# Patient Record
Sex: Female | Born: 1970
Health system: Southern US, Community
[De-identification: ages and names within clinical notes are randomized; demographics above are authoritative.]

## PROBLEM LIST (undated history)

## (undated) DIAGNOSIS — Z603 Acculturation difficulty: Secondary | ICD-10-CM

## (undated) DIAGNOSIS — E785 Hyperlipidemia, unspecified: Secondary | ICD-10-CM

## (undated) DIAGNOSIS — K219 Gastro-esophageal reflux disease without esophagitis: Secondary | ICD-10-CM

## (undated) DIAGNOSIS — E039 Hypothyroidism, unspecified: Secondary | ICD-10-CM

## (undated) DIAGNOSIS — Z789 Other specified health status: Secondary | ICD-10-CM

## (undated) DIAGNOSIS — K760 Fatty (change of) liver, not elsewhere classified: Secondary | ICD-10-CM

## (undated) DIAGNOSIS — M48061 Spinal stenosis, lumbar region without neurogenic claudication: Secondary | ICD-10-CM

## (undated) DIAGNOSIS — Z758 Other problems related to medical facilities and other health care: Secondary | ICD-10-CM

## (undated) DIAGNOSIS — I10 Essential (primary) hypertension: Secondary | ICD-10-CM

## (undated) DIAGNOSIS — M1990 Unspecified osteoarthritis, unspecified site: Secondary | ICD-10-CM

## (undated) DIAGNOSIS — N028 Recurrent and persistent hematuria with other morphologic changes: Secondary | ICD-10-CM

## (undated) DIAGNOSIS — E669 Obesity, unspecified: Secondary | ICD-10-CM

## (undated) DIAGNOSIS — F419 Anxiety disorder, unspecified: Secondary | ICD-10-CM

## (undated) DIAGNOSIS — K759 Inflammatory liver disease, unspecified: Secondary | ICD-10-CM

## (undated) DIAGNOSIS — R011 Cardiac murmur, unspecified: Secondary | ICD-10-CM

## (undated) DIAGNOSIS — E119 Type 2 diabetes mellitus without complications: Secondary | ICD-10-CM

## (undated) DIAGNOSIS — R809 Proteinuria, unspecified: Secondary | ICD-10-CM

## (undated) HISTORY — DX: Hypothyroidism, unspecified: E03.9

## (undated) HISTORY — DX: Fatty (change of) liver, not elsewhere classified: K76.0

## (undated) HISTORY — DX: Unspecified osteoarthritis, unspecified site: M19.90

## (undated) HISTORY — DX: Gastro-esophageal reflux disease without esophagitis: K21.9

## (undated) HISTORY — DX: Acculturation difficulty: Z60.3

## (undated) HISTORY — DX: Other specified health status: Z78.9

## (undated) HISTORY — DX: Obesity, unspecified: E66.9

## (undated) HISTORY — DX: Proteinuria, unspecified: R80.9

## (undated) HISTORY — DX: Other problems related to medical facilities and other health care: Z75.8

## (undated) HISTORY — DX: Recurrent and persistent hematuria with other morphologic changes: N02.8

## (undated) HISTORY — DX: Hyperlipidemia, unspecified: E78.5

## (undated) HISTORY — DX: Spinal stenosis, lumbar region without neurogenic claudication: M48.061

---

## 2004-09-27 ENCOUNTER — Ambulatory Visit: Payer: Self-pay | Admitting: Internal Medicine

## 2004-09-30 ENCOUNTER — Ambulatory Visit: Payer: Self-pay | Admitting: Internal Medicine

## 2007-04-20 ENCOUNTER — Ambulatory Visit (HOSPITAL_COMMUNITY): Admission: RE | Admit: 2007-04-20 | Discharge: 2007-04-20 | Payer: Self-pay | Admitting: Obstetrics

## 2007-06-20 ENCOUNTER — Inpatient Hospital Stay (HOSPITAL_COMMUNITY): Admission: AD | Admit: 2007-06-20 | Discharge: 2007-06-26 | Payer: Self-pay | Admitting: Obstetrics & Gynecology

## 2008-01-20 ENCOUNTER — Emergency Department (HOSPITAL_COMMUNITY): Admission: EM | Admit: 2008-01-20 | Discharge: 2008-01-20 | Payer: Self-pay | Admitting: Emergency Medicine

## 2010-12-16 ENCOUNTER — Emergency Department (HOSPITAL_COMMUNITY)
Admission: EM | Admit: 2010-12-16 | Discharge: 2010-12-17 | Disposition: A | Discharge status: Home / Self Care | Payer: BC Managed Care – PPO | Attending: Emergency Medicine | Admitting: Emergency Medicine

## 2010-12-16 DIAGNOSIS — I10 Essential (primary) hypertension: Secondary | ICD-10-CM | POA: Insufficient documentation

## 2010-12-16 DIAGNOSIS — R319 Hematuria, unspecified: Secondary | ICD-10-CM | POA: Insufficient documentation

## 2010-12-16 DIAGNOSIS — G8929 Other chronic pain: Secondary | ICD-10-CM | POA: Insufficient documentation

## 2010-12-16 DIAGNOSIS — M545 Low back pain, unspecified: Secondary | ICD-10-CM | POA: Insufficient documentation

## 2010-12-16 DIAGNOSIS — R1110 Vomiting, unspecified: Secondary | ICD-10-CM | POA: Insufficient documentation

## 2010-12-16 DIAGNOSIS — R51 Headache: Secondary | ICD-10-CM | POA: Insufficient documentation

## 2010-12-16 LAB — URINALYSIS, ROUTINE W REFLEX MICROSCOPIC
Nitrite: NEGATIVE
Protein, ur: >300 mg/dL — AB
Urobilinogen, UA: 1 mg/dL (ref 0.0–1.0)
pH: 6.5 (ref 5.0–8.0)

## 2010-12-16 LAB — POCT I-STAT, CHEM 8
Chloride: 103 mEq/L (ref 96–112)
Glucose, Bld: 96 mg/dL (ref 70–99)
Hemoglobin: 15 g/dL (ref 12.0–15.0)
TCO2: 24 mmol/L (ref 0–100)

## 2010-12-16 LAB — URINE MICROSCOPIC-ADD ON

## 2010-12-17 LAB — URINE CULTURE
Colony Count: 65000
Culture  Setup Time: 201205150037

## 2010-12-17 NOTE — H&P (Signed)
Linda Black, Linda Black NO.:  [id number]   MEDICAL RECORD NO.:  [id number]          PATIENT TYPE:  INP   LOCATION:  9196                          FACILITY:  [facility]   PHYSICIAN:  Agnes Lawrence, M.D.DATE OF BIRTH:  01-15-1971   DATE OF ADMISSION:  06/20/2007  DATE OF DISCHARGE:                              HISTORY & PHYSICAL   CHIEF COMPLAINT:  The the patient is a 40 year old para 105, with an  estimated date of confinement of July 15, 2008, with an intrauterine  pregnancy at 36+ weeks, who presents complaining of decreased fetal  movement and was found to have elevated blood pressures.   HISTORY OF PRESENT ILLNESS:  Please see the above.  The patient denies  any history of pregnancy-induced hypertension or chronic hypertension.  She denies any neurological complaints.  An initial blood pressure at 14  weeks was in the 130s/80s.  At 32-1/2 weeks, there was one blood  pressure reading of 140/80.   ALLERGIES:  No known drug allergies.   MEDICATIONS:  See the reconciliation form.   CURRENT RISK FACTORS:  Advanced maternal age.   PAST OBSTETRICAL HISTORY:  In 1991, she was delivered of a live born  female, 6 pounds, vaginal delivery.  In 1993, she was delivered of a  live born female, 6 pounds, vaginal delivery.  In 1996, she was  delivered of a live born female, 7 pounds, vaginal delivery.  In 2005, she  was delivered of a live born female, 7 pounds, vaginal delivery.   PRENATAL SCREENING:  Chlamydia probe negative.  Urine culture and  sensitivity insignificant growth.  GC probe negative.  1-hour GCT 116.  Hepatitis B surface antigen negative.  Hematocrit 36.1, hemoglobin 12.4.  HIV nonreactive.  Platelets 302,000.  Blood type is A-positive, antibody  screen negative.  RPR nonreactive.  Rubella immune.  Sickle cell  negative.  An ultrasound performed on August 16 at 27 weeks, 4 days,  redated the patient.  There was no previa.  Normal amniotic fluid.   PAST  GYN HISTORY:  Noncontributory.   PAST MEDICAL HISTORY:  No significant history of medical diseases.   PAST SURGICAL HISTORY:  She denies.   SOCIAL HISTORY:  Married, living with her spouse.  Does not give any  significant history of alcohol usage.  Has no significant smoking  history.  Denies illicit drug use.   FAMILY HISTORY:  Hypertension.   PHYSICAL EXAM:  VITAL SIGNS:  Blood pressures 150s/90s to 170s/100s, and  afebrile.  Fetal heart tracing reassuring.  ABDOMEN:  Gravid.   ASSESSMENTS:  Intrauterine pregnancy at  36+ weeks.  Rule out pregnancy-  induced hypertension.  Fetal heart tracing consistent with fetal well-  being.   PLAN:  Admission.  We will check a PIH panel, serial nonstress tests,  complete OB ultrasound for growth, and we will also collect a 24-hour  urine for protein and creatinine.  Possibly administer antihypertensive  medications.      Agnes Lawrence, M.D.  Electronically Signed     LAJ/MEDQ  D:  06/20/2007  T:  06/21/2007  Job:  [id number]

## 2010-12-20 NOTE — Discharge Summary (Signed)
NAMEEDITH, Linda Black                       ACCOUNT NO.:  [id number]   MEDICAL RECORD NO.:  [id number]          PATIENT TYPE:  INP   LOCATION:  9132                          FACILITY:  [facility]   PHYSICIAN:  Agnes Lawrence, M.D.DATE OF BIRTH:  [birth year]   DATE OF ADMISSION:  06/20/2007  DATE OF DISCHARGE:  06/26/2007                               DISCHARGE SUMMARY   CHIEF COMPLAINT:  The patient is a 40 year old para 4 with an estimated  date of confinement of December 12 with an intrauterine pregnancy at 36+  weeks with an complaining of decreased fetal movement and was found to  have elevated blood pressures.  Please see the dictated history and  physical for further details.   HOSPITAL COURSE:  The patient was admitted.  Her blood pressures were  130s-140s/80s.  On ultrasound, an amniotic fluid index was 2.6.  A 24-  hour urine was obtained and the total protein was 833 mg in 24 hours.  Addendum to the ultrasound reported the estimated fetal weight  percentile was the 27 percentile.  At this point, she was felt to have  preeclampsia and oligohydramnios.  Maternal fetal medicine was  consulted.  The recommendation was delivery.  Unfortunately, there were  no NICU beds available at which point expected management was  recommended until at which point there was an available bed for the  neonate.  Her labor was induced on November 19 and the membranes were  artificially ruptured.  She progressed in labor and was delivered of a  live born female 2330 grams with Apgars 9 at 1 and 5 minutes,  respectively.  She had a second-degree perineal laceration.  The  estimated blood loss was unremarkable.  She had been placed on magnesium  sulfate during labor and this was continued for 24 hours postpartum.  Her blood pressures remained stable.  After the magnesium had been  discontinued, her blood pressures remained stable and she was discharged  to home on postpartum day #2.   DISCHARGE DIAGNOSIS:   Preeclampsia at 36+ weeks.   PROCEDURE:  Induction of labor and vaginal delivery.   CONDITION:  Stable.   DISCHARGE INSTRUCTIONS:  1. Diet:  Regular.  2. Activities:  Pelvic rest, progressive activity.   MEDICATIONS:  Percocet.   DISPOSITION:  The patient was to follow up in 2 days for blood pressure  check.      Agnes Lawrence, M.D.  Electronically Signed     LAJ/MEDQ  D:  07/22/2007  T:  07/23/2007  Job:  [id number]

## 2011-05-01 LAB — POCT I-STAT, CHEM 8
Creatinine, Ser: 0.8
HCT: 41
Hemoglobin: 13.9
Sodium: 140
TCO2: 23

## 2011-05-01 LAB — URINE MICROSCOPIC-ADD ON

## 2011-05-01 LAB — URINALYSIS, ROUTINE W REFLEX MICROSCOPIC
Bilirubin Urine: NEGATIVE
Glucose, UA: NEGATIVE
Nitrite: NEGATIVE
Urobilinogen, UA: 0.2

## 2011-05-01 LAB — POCT PREGNANCY, URINE
Operator id: 146091
Preg Test, Ur: NEGATIVE

## 2011-05-13 LAB — DIFFERENTIAL
Basophils Absolute: 0
Basophils Relative: 0
Lymphocytes Relative: 8 — ABNORMAL LOW
Lymphs Abs: 0.9
Neutrophils Relative %: 87 — ABNORMAL HIGH

## 2011-05-13 LAB — URINALYSIS, ROUTINE W REFLEX MICROSCOPIC
Ketones, ur: NEGATIVE
Leukocytes, UA: NEGATIVE
Protein, ur: 30 — AB

## 2011-05-13 LAB — CBC
HCT: 33.9 — ABNORMAL LOW
HCT: 34.6 — ABNORMAL LOW
HCT: 36.5
Hemoglobin: 12.5
MCHC: 34.2
MCHC: 35.2
MCV: 86.2
MCV: 86.7
Platelets: 294
Platelets: 303
Platelets: 306
RBC: 4.02
RBC: 4.21
RDW: 12.4
RDW: 12.7
RDW: 12.9
WBC: 11.3 — ABNORMAL HIGH
WBC: 11.4 — ABNORMAL HIGH

## 2011-05-13 LAB — URINE MICROSCOPIC-ADD ON

## 2011-05-13 LAB — COMPREHENSIVE METABOLIC PANEL
ALT: 13
Albumin: 2.4 — ABNORMAL LOW
Alkaline Phosphatase: 104
GFR calc Af Amer: >60
Sodium: 136

## 2011-05-13 LAB — PROTEIN, URINE, 24 HOUR
Collection Interval-UPROT: 24
Protein, 24H Urine: 833 — ABNORMAL HIGH
Protein, Urine: 34

## 2011-05-13 LAB — CREATININE CLEARANCE, URINE, 24 HOUR
Collection Interval-CRCL: 24
Urine Total Volume-CRCL: 2450

## 2011-05-13 LAB — SAMPLE TO BLOOD BANK

## 2011-05-13 LAB — URIC ACID: Uric Acid, Serum: 5.3

## 2012-08-07 ENCOUNTER — Emergency Department (HOSPITAL_COMMUNITY): Payer: BC Managed Care – PPO

## 2012-08-07 ENCOUNTER — Encounter (HOSPITAL_COMMUNITY): Payer: Self-pay | Admitting: Emergency Medicine

## 2012-08-07 ENCOUNTER — Emergency Department (HOSPITAL_COMMUNITY)
Admission: EM | Admit: 2012-08-07 | Discharge: 2012-08-07 | Disposition: A | Discharge status: Home / Self Care | Payer: BC Managed Care – PPO | Attending: Emergency Medicine | Admitting: Emergency Medicine

## 2012-08-07 DIAGNOSIS — M7730 Calcaneal spur, unspecified foot: Secondary | ICD-10-CM | POA: Insufficient documentation

## 2012-08-07 DIAGNOSIS — J029 Acute pharyngitis, unspecified: Secondary | ICD-10-CM | POA: Insufficient documentation

## 2012-08-07 DIAGNOSIS — N390 Urinary tract infection, site not specified: Secondary | ICD-10-CM

## 2012-08-07 DIAGNOSIS — R109 Unspecified abdominal pain: Secondary | ICD-10-CM | POA: Insufficient documentation

## 2012-08-07 DIAGNOSIS — R509 Fever, unspecified: Secondary | ICD-10-CM | POA: Insufficient documentation

## 2012-08-07 DIAGNOSIS — Z79899 Other long term (current) drug therapy: Secondary | ICD-10-CM | POA: Insufficient documentation

## 2012-08-07 LAB — URINALYSIS, ROUTINE W REFLEX MICROSCOPIC
Bilirubin Urine: NEGATIVE
Ketones, ur: NEGATIVE mg/dL
Protein, ur: >300 mg/dL — AB
Urobilinogen, UA: 0.2 mg/dL (ref 0.0–1.0)

## 2012-08-07 LAB — URINE MICROSCOPIC-ADD ON

## 2012-08-07 LAB — RAPID STREP SCREEN (MED CTR MEBANE ONLY): Streptococcus, Group A Screen (Direct): NEGATIVE

## 2012-08-07 MED ORDER — CIPROFLOXACIN HCL 500 MG PO TABS: 500.0000 mg | ORAL_TABLET | Freq: Two times a day (BID) | ORAL | Status: DC

## 2012-08-07 MED ORDER — IBUPROFEN 400 MG PO TABS: 400.0000 mg | ORAL_TABLET | Freq: Four times a day (QID) | ORAL | Status: DC | PRN

## 2012-08-07 NOTE — ED Notes (Signed)
Patient complaint of generalized body with cough cold and congestion for two days with fever. Patient complains of pain in the back both feet, She also brings with her a paper from a mammogram that was abnormal, and she complains of pain in the right breast area.

## 2012-08-07 NOTE — ED Provider Notes (Signed)
History     CSN: [id number]  Arrival date & time 08/07/12  1048   First MD Initiated Contact with Patient 08/07/12 1225      Chief Complaint  Patient presents with  . Generalized Body Aches    (Consider location/radiation/quality/duration/timing/severity/associated sxs/prior treatment) HPI Comments: The patient is a 42 year old woman who is [place] and speaks only [place]. Her daughter translates for her. She has been having fever, aches all over particularly in the left CVA region, has painful nodule under her right axilla, and pain in her right heel when she stands. Her fever and myalgias have been going on for 2 days. She has a history of hypertension, and is on hydrochlorothiazide for that. She does not smoke or drink.  She has had no treatment for these symptoms.    Patient is a 42 y.o. female presenting with fever. The history is provided by the patient. A language interpreter was used.  Fever Primary symptoms of the febrile illness include fever. The current episode started 2 days ago. This is a new problem. The problem has not changed since onset. Associated with: Sore throat, left flank pain.    History reviewed. No pertinent past medical history.  History reviewed. No pertinent past surgical history.  No family history on file.  History  Substance Use Topics  . Smoking status: Not on file  . Smokeless tobacco: Not on file  . Alcohol Use: Not on file    OB History    Grav Para Term Preterm Abortions TAB SAB Ect Mult Living                  Review of Systems  Constitutional: Positive for fever and chills.  HENT: Positive for sore throat.   Eyes: Negative.   Respiratory: Negative.   Cardiovascular: Negative.   Gastrointestinal: Negative.   Genitourinary: Positive for flank pain.  Skin: Negative.   Neurological: Negative.   Hematological:       Painful nodule in right axilla.  Psychiatric/Behavioral: Negative.     Allergies  Review of patient's  allergies indicates no known allergies.  Home Medications   Current Outpatient Rx  Name  Route  Sig  Dispense  Refill  . HYDROCHLOROTHIAZIDE 12.5 MG PO CAPS   Oral   Take 12.5 mg by mouth daily.           BP 177/97  Pulse 75  Temp 98.2 F (36.8 C) (Oral)  Resp 16  SpO2 98%  LMP 08/02/2012  Physical Exam  Nursing note and vitals reviewed. Constitutional: She appears well-developed and well-nourished. No distress.  HENT:  Head: Normocephalic and atraumatic.  Right Ear: External ear normal.  Left Ear: External ear normal.       Throat red.  Eyes: Conjunctivae normal and EOM are normal. Pupils are equal, round, and reactive to light.  Neck: Normal range of motion. Neck supple.  Cardiovascular: Normal rate, regular rhythm and normal heart sounds.   Pulmonary/Chest: Effort normal and breath sounds normal.  Abdominal: Soft.  Musculoskeletal:       Mild left CVA tenderness.  Tender over right heel, no redness, heat or fluctuance.    Neurological: She is alert.       No sensory or motor deficit.  Skin: Skin is warm and dry.       She localizes pain to the right axilla, where she has a small nodule appx 1 cm diameter which is nontender.  Psychiatric: She has a normal mood and affect. Her  behavior is normal.       Unable to assess.    2:23 PM X-ray of right foot shows a calcaneal spur.    ED Course  Procedures (including critical care time)   Labs Reviewed  RAPID STREP SCREEN  URINALYSIS, ROUTINE W REFLEX MICROSCOPIC   4:09 PM Results for orders placed during the hospital encounter of 08/07/12  URINALYSIS, ROUTINE W REFLEX MICROSCOPIC      Component Value Range   Color, Urine YELLOW  YELLOW   APPearance CLEAR  CLEAR   Specific Gravity, Urine 1.018  1.005 - 1.030   pH 7.0  5.0 - 8.0   Glucose, UA NEGATIVE  NEGATIVE mg/dL   Hgb urine dipstick LARGE (*) NEGATIVE   Bilirubin Urine NEGATIVE  NEGATIVE   Ketones, ur NEGATIVE  NEGATIVE mg/dL   Protein, ur >300 (*)  NEGATIVE mg/dL   Urobilinogen, UA 0.2  0.0 - 1.0 mg/dL   Nitrite NEGATIVE  NEGATIVE   Leukocytes, UA SMALL (*) NEGATIVE  RAPID STREP SCREEN      Component Value Range   Streptococcus, Group A Screen (Direct) NEGATIVE  NEGATIVE  URINE MICROSCOPIC-ADD ON      Component Value Range   WBC, UA 0-2  <3 WBC/hpf   RBC / HPF 7-10  <3 RBC/hpf   Bacteria, UA RARE  RARE   Urine-Other RARE YEAST     Dg Foot Complete Right  08/07/2012  *RADIOLOGY REPORT*  Clinical Data: Right heel pain while weightbearing.  RIGHT FOOT COMPLETE - 3+ VIEW  Comparison: None.  Findings: Almost imperceptible plantar calcaneal spur. Enthesopathic spur at the insertion of the Achilles tendon on the posterior calcaneus.  No other intrinsic osseous abnormalities involving the bones of the foot.  Well-preserved joint spaces. Well-preserved bone mineral density.  No acute or subacute fracture or dislocation.  IMPRESSION: Tiny, almost imperceptible plantar calcaneal spur.  No significant abnormalities.   Original Report Authenticated By: Evangeline Dakin, M.D.     Will Rx for UTI with Cipro and for calcaneal spur with Ibuprofen 400 mg tid x 5 days.   1. Urinary tract infection   2. Calcaneal spur          Mylinda Latina III, MD 08/07/12 [phone]

## 2012-08-07 NOTE — ED Notes (Signed)
Patient went to the rest room to obtain a urine sample

## 2012-08-07 NOTE — ED Notes (Signed)
Back from xray

## 2014-03-18 ENCOUNTER — Emergency Department (HOSPITAL_COMMUNITY): Payer: BC Managed Care – PPO

## 2014-03-18 ENCOUNTER — Encounter (HOSPITAL_COMMUNITY): Payer: Self-pay | Admitting: Emergency Medicine

## 2014-03-18 ENCOUNTER — Emergency Department (HOSPITAL_COMMUNITY)
Admission: EM | Admit: 2014-03-18 | Discharge: 2014-03-18 | Disposition: A | Discharge status: Home / Self Care | Payer: BC Managed Care – PPO | Attending: Emergency Medicine | Admitting: Emergency Medicine

## 2014-03-18 DIAGNOSIS — R202 Paresthesia of skin: Secondary | ICD-10-CM

## 2014-03-18 DIAGNOSIS — I10 Essential (primary) hypertension: Secondary | ICD-10-CM | POA: Insufficient documentation

## 2014-03-18 DIAGNOSIS — R209 Unspecified disturbances of skin sensation: Secondary | ICD-10-CM | POA: Diagnosis not present

## 2014-03-18 DIAGNOSIS — Z79899 Other long term (current) drug therapy: Secondary | ICD-10-CM | POA: Diagnosis not present

## 2014-03-18 DIAGNOSIS — R42 Dizziness and giddiness: Secondary | ICD-10-CM | POA: Diagnosis not present

## 2014-03-18 DIAGNOSIS — R5383 Other fatigue: Secondary | ICD-10-CM | POA: Diagnosis not present

## 2014-03-18 DIAGNOSIS — R5381 Other malaise: Secondary | ICD-10-CM | POA: Insufficient documentation

## 2014-03-18 HISTORY — DX: Essential (primary) hypertension: I10

## 2014-03-18 LAB — URINALYSIS, ROUTINE W REFLEX MICROSCOPIC
Bilirubin Urine: NEGATIVE
GLUCOSE, UA: NEGATIVE mg/dL
KETONES UR: NEGATIVE mg/dL
LEUKOCYTES UA: NEGATIVE
Nitrite: NEGATIVE
PROTEIN: 100 mg/dL — AB
Specific Gravity, Urine: 1.011 (ref 1.005–1.030)
Urobilinogen, UA: 0.2 mg/dL (ref 0.0–1.0)
pH: 7 (ref 5.0–8.0)

## 2014-03-18 LAB — CBC WITH DIFFERENTIAL/PLATELET
BASOS ABS: 0.1 10*3/uL (ref 0.0–0.1)
BASOS PCT: 1 % (ref 0–1)
EOS ABS: 0.4 10*3/uL (ref 0.0–0.7)
EOS PCT: 6 % — AB (ref 0–5)
HCT: 36.8 % (ref 36.0–46.0)
Hemoglobin: 12.5 g/dL (ref 12.0–15.0)
Lymphocytes Relative: 27 % (ref 12–46)
Lymphs Abs: 1.8 10*3/uL (ref 0.7–4.0)
MCH: 29.8 pg (ref 26.0–34.0)
MCHC: 34 g/dL (ref 30.0–36.0)
MCV: 87.8 fL (ref 78.0–100.0)
Monocytes Absolute: 0.3 10*3/uL (ref 0.1–1.0)
Monocytes Relative: 5 % (ref 3–12)
Neutro Abs: 4.1 10*3/uL (ref 1.7–7.7)
Neutrophils Relative %: 61 % (ref 43–77)
PLATELETS: 288 10*3/uL (ref 150–400)
RBC: 4.19 MIL/uL (ref 3.87–5.11)
RDW: 12.2 % (ref 11.5–15.5)
WBC: 6.7 10*3/uL (ref 4.0–10.5)

## 2014-03-18 LAB — BASIC METABOLIC PANEL
ANION GAP: 11 (ref 5–15)
BUN: 17 mg/dL (ref 6–23)
CALCIUM: 8.7 mg/dL (ref 8.4–10.5)
CO2: 28 mEq/L (ref 19–32)
Chloride: 100 mEq/L (ref 96–112)
Creatinine, Ser: 1.05 mg/dL (ref 0.50–1.10)
GFR, EST AFRICAN AMERICAN: 75 mL/min — AB (ref >90)
GFR, EST NON AFRICAN AMERICAN: 65 mL/min — AB (ref >90)
GLUCOSE: 115 mg/dL — AB (ref 70–99)
Potassium: 3.2 mEq/L — ABNORMAL LOW (ref 3.7–5.3)
SODIUM: 139 meq/L (ref 137–147)

## 2014-03-18 LAB — URINE MICROSCOPIC-ADD ON

## 2014-03-18 LAB — TROPONIN I: Troponin I: <0.3 ng/mL (ref <0.30)

## 2014-03-18 LAB — PRO B NATRIURETIC PEPTIDE: Pro B Natriuretic peptide (BNP): 101.8 pg/mL (ref 0–125)

## 2014-03-18 NOTE — ED Notes (Signed)
To ct

## 2014-03-18 NOTE — ED Provider Notes (Signed)
Pt received at change of shift with MRI brain pending to address pt's c/o RUE and RLE "heaviness." MRI normal. VS remain stable, resps easy, neuro exam intact and unchanged from previous assessment. Pt has ambulated with steady gait. Pt states she is ready to go home now. Dx and testing d/w pt and family.  Questions answered.  Verb understanding, agreeable to d/c home with outpt f/u.    Mr Brain Wo Contrast 03/18/2014   CLINICAL DATA:  Right-sided heaviness and dizziness.  EXAM: MRI HEAD WITHOUT CONTRAST  TECHNIQUE: Multiplanar, multiecho pulse sequences of the brain and surrounding structures were obtained without intravenous contrast.  COMPARISON:  Head CT 03/18/2014  FINDINGS: There is no acute infarct. Ventricles and sulci are normal for age. There is no evidence of intracranial hemorrhage, mass, midline shift, or extra-axial fluid collection. No brain parenchymal signal abnormality is identified.  Orbits are unremarkable. Paranasal sinuses and mastoid air cells are clear. Major intracranial vascular flow voids are preserved. Calvarium and scalp soft tissues are unremarkable.  IMPRESSION: Unremarkable brain MRI.   Electronically Signed   By: Logan Bores   On: 03/18/2014 17:49    Francine Graven, DO 03/18/14 [id number]

## 2014-03-18 NOTE — ED Notes (Signed)
Pt ambulated in hallway and to restroom. Pt states that her rt eye feels "dizzy". Pt steady on feet.

## 2014-03-18 NOTE — Discharge Instructions (Signed)
°Emergency Department Resource Guide °1) Find a Doctor and Pay Out of Pocket °Although you won't have to find out who is covered by your insurance plan, it is a good idea to ask around and get recommendations. You will then need to call the office and see if the doctor you have chosen will accept you as a new patient and what types of options they offer for patients who are self-pay. Some doctors offer discounts or will set up payment plans for their patients who do not have insurance, but you will need to ask so you aren't surprised when you get to your appointment. ° °2) Contact Your Local Health Department °Not all health departments have doctors that can see patients for sick visits, but many do, so it is worth a call to see if yours does. If you don't know where your local health department is, you can check in your phone book. The CDC also has a tool to help you locate your state's health department, and many state websites also have listings of all of their local health departments. ° °3) Find a Walk-in Clinic °If your illness is not likely to be very severe or complicated, you may want to try a walk in clinic. These are popping up all over the country in pharmacies, drugstores, and shopping centers. They're usually staffed by nurse practitioners or physician assistants that have been trained to treat common illnesses and complaints. They're usually fairly quick and inexpensive. However, if you have serious medical issues or chronic medical problems, these are probably not your best option. ° °No Primary Care Doctor: °- Call [facility] at  [phone] - they can help you locate a primary care doctor that  accepts your insurance, provides certain services, etc. °- Physician Referral Service- 1-800-533-3463 ° °Chronic Pain Problems: °Organization         Address  Phone   Notes  °[facility]  ([phone]) [phone] Patients need to be referred by their primary care doctor.  ° °Medication  Assistance: °Organization         Address  Phone   Notes  °[place] [facility] [address] °[place], [place] [address] (336) 641-8030 --Must be a resident of [place] °-- Must have NO insurance coverage whatsoever (no Medicaid/ Medicare, etc.) °-- The pt. MUST have a primary care doctor that directs their care regularly and follows them in the community °  °[facility]  (866) 331-1348   °[employer]  (888) 892-1162   ° °Agencies that provide inexpensive medical care: °Organization         Address  Phone   Notes  °[facility]  (336) 832-8035   °[facility]    (336) 832-7272   °[facility] [address] °[place], [place] [address] (336) 832-4777   °[facility] [address], °[place] (336) 271-4999   °[facility]    (336) 373-0678   °[facility]    (336) 272-1050   °[facility] ° [address], [place] Phone:  (336) 832-4444, Fax:  (336) 832-4440 Hours of Operation:  9 am - 6 pm, M-F.  Also accepts Medicaid/Medicare and self-pay.  °[facility] ° [address], [place] Phone: (336) [phone], Fax: (336) [phone]. Hours of Operation:  8:30 am - 5:30 pm, M-F.  Also accepts Medicaid and self-pay.  °[facility] [place] [address], [place] Phone: (336) 878-6027   °[facility] [address], [place], [place] (336)723-1848, Ext. 123 Mondays & Thursdays: 7-9 AM.  First 15 patients are seen on a first come, first serve basis. °  ° °Medicaid-accepting [place] Providers: ° °Organization         Address  Phone   Notes  °[facility] [address], [place] (336) 641-2100 Also accepts self-pay patients.  °[facility] [address], [place] ° (336) [phone]   °[facility] [address], [place]  (336) 288-8857   °[facility] [address] [place] [address], [place] (336) 299-7000   °Veita Bland [address], [place]  ° (336) [phone] Only accepts [place] Access Medicaid patients after they have their name applied to their card.  ° °Self-Pay (no insurance) in [place]: ° °Organization         Address  Phone   Notes  °Sickle Cell Patients, [facility] [address], [place] (336) 832-1970   °[facility] [address], [place] (336) 832-4400   °[facility] ° [address], [place] (336) 992-4800   °[facility]/Dr. Osei-Bonsu ° [address] [place] [address], [place] or [address], [place] (336) [phone] Phone number for both [place] and [place] locations is the same.  °[facility] [address], [place] (336) [phone]   °[facility] [address] [place] [address], [place] or [address] (336) 852-7530 °(336) 878-2260   °[facility] [address], [place] (336) 350-1642, phone; (336) 294-5005, fax Sees patients 1st and 3rd Saturday of every month.  Must not qualify for public or private insurance (i.e. Medicaid, Medicare, [place] Health Choice, Veterans' Benefits) • Household income should be no more than 200% of the poverty level •The clinic cannot treat you if you are pregnant or think you are pregnant • Sexually transmitted diseases are not treated at the clinic.  ° ° °Dental Care: °Organization         Address  Phone  Notes  °[place] [facility] [address], [place] (336) 641-6152 Accepts children up to age 21 who are enrolled in Medicaid or [place] Health Choice; pregnant women with a Medicaid card; and children who have applied for Medicaid or [place] Health Choice, but were declined, whose parents can pay a reduced fee at time of service.  °[place]  [facility] [place]  [address], [place] (336) 641-7733 Accepts children up to age 21 who are enrolled in Medicaid or [place] Health Choice; pregnant women with a Medicaid card; and children who have applied for Medicaid or [place] Health Choice, but were declined, whose parents can pay a reduced fee at time of service.  °[facility] ° [address], [place] (336) 641-4533 Patients are seen by appointment only. Walk-ins are not accepted. [facility] will see patients 18 years of age and older. °Monday - Tuesday (8am-5pm) °Most Wednesdays (8:30-5pm) °$30 per visit, cash only  °[facility] ° [address], [place] (336) 641-4533 Patients are seen by appointment only. Walk-ins are not accepted. [facility] will see patients 18 years of age and older. °One   Wednesday Evening (Monthly: Volunteer Based).  $30 per visit, cash only  °[facility]  (919) 537-3737 for adults; Children under age 4, call Graduate Pediatric Dentistry at ([phone]) [phone]. Children aged 4-14, please call ([phone]) [phone] to request a pediatric application. ° Dental services are provided in all areas of dental care including fillings, crowns and bridges, complete and partial dentures, implants, gum treatment, root canals, and extractions. Preventive care is also provided. Treatment is provided to both adults and children. °Patients are selected via a lottery and there is often a waiting list. °  °[facility] [address], °[place] ° (336) 763-8833 [email or web address] °  °[facility] [address], [place], [place] (336)723-1848, Ext. 123 Second and Fourth Thursday of each month, opens at 6:30 AM; Clinic ends at 9 AM.  Patients are seen on a first-come first-served basis, and a limited number are seen during each clinic.  ° °[facility] ° [address], [place], [place] (336) [phone]    Eligibility Requirements °You must have lived in [place], [place], or [place] counties for at least the last three months. °  You cannot be eligible for state or federal sponsored healthcare insurance, including Veterans Administration, Medicaid, or Medicare. °  You generally cannot be eligible for healthcare insurance through your employer.  °  How to apply: °Eligibility screenings are held every Tuesday and Wednesday afternoon from 1:00 pm until 4:00 pm. You do not need an appointment for the interview!  °[facility] [address], [place], [place] 336-631-2330   °[facility] [facility]  336-342-8273   °[facility]  336-703-3100   °[facility]  336-570-6415   ° °Behavioral Health Resources in the Community: °Intensive Outpatient Programs °Organization         Address  Phone  Notes  °[facility] [address], [place], [place] 336-878-6098   °[facility] [address], [place], [place] 336-832-9800   °[facility] [address], [place], [place] ° 336-882-2125   °[facility] [address],  °[place], [place] 1-800-853-5163 or 336-641-4981   °Substance Abuse Resources °Organization         Address  Phone  Notes  °[facility]  336-882-2125   °[facility]  336-784-9470   °[facility]  336-285-9073   °[facility]  336-845-3988   °Residential & Outpatient Substance Abuse Program  1-800-659-3381   °Psychological Services °Organization         Address  Phone  Notes  °[facility]  336- 832-9600   °[facility]  336- 378-7881   °[facility] [address], [place] 1-800-853-5163 or 336-641-4981   ° °Mobile Crisis Teams °Organization         Address  Phone  Notes  °[facility], Mobile Crisis Care Unit  1-877-626-1772   °Assertive °[facility] ° [address]  [place], [place] 336-834-9664   °Sharon DeEsch [address] °[place] 336-554-5454   ° °Self-Help/Support Groups °Organization         Address  Phone             Notes  °[facility]  - variety of support groups  336- 373-1402 Call for more information  °Narcotics Anonymous (NA), [facility] [address], °[place]   2 meetings at this location  ° °  Residential Treatment Programs °Organization         Address  Phone  Notes  °[facility] [address],    °[place]  1-866-801-8205   °[facility] ° [address], [place], [place] 704-293-8524   °[facility] [address], [place] 336-845-3988 Admissions: 8am-3pm M-F  °[facility] [address]    °[place], [place] 336-841-1104   °[facility] [address], [place], [place] 336-379-7146   °[facility] [address]  °[place], [place] 336-285-9073   °[facility] - Intensive Outpatient [address], [place], [place] 336-852-3033   °[facility] [address]  °[place], [place] 1-877-615-2722 or 336-784-9470   °[facility] [address] [place], [place] 336-227-7417 Accepts Medicaid  °[facility] [address]  °[place] 1-800-659-3381 Substance Abuse/Addiction Treatment  ° °[place] Behavioral Health Resources °Organization         Address  Phone  Notes  °[facility]  (888) 581-9988   °Julie Brannon, PhD [address] [place], [place]   (336) 349-5553 or (336) 951-0000   °[facility]   [address] °[place], [place] (336) 349-4454   °[facility] [address], [place], [place] (336) 342-8316 Insurance/Medicaid/sponsorship through [facility]  °[facility] [address]                                    [place], [place] (336) 342-8316 Therapy/tele-psych/case    °[facility] [address]  ° [place], [place] (336) 349-2233    °Dr. Arfeen  (336) 349-4544   °[facility] [place]  [employer] [place] [facility] 1) [address], [place] °2) [address], [place] °3)  [address], [place] (336) 349-3220 °(336) 342-7768 ° °(336) 342-8140   °[place] Child [facility] (336) 342-1394 or (336) 342-3537 (After Hours)    ° ° °Take your usual prescriptions as previously directed.  Call your regular medical doctor on Monday to schedule a follow up appointment within the next 2 days.  Return to the Emergency Department immediately sooner if worsening.  ° °

## 2014-03-18 NOTE — ED Provider Notes (Signed)
CSN: [id number]     Arrival date & time 03/18/14  1154 History   First MD Initiated Contact with Patient 03/18/14 1233     Chief Complaint  Patient presents with  . Hypertension     (Consider location/radiation/quality/duration/timing/severity/associated sxs/prior Treatment) The history is provided by the patient and a relative. A language interpreter was used (Patient's daughter).    H Linda Black is a 43 y.o. female who presents for evaluation of headache, and right arm and leg heaviness, which have been present for 2 weeks, constantly. Today, she went to her doctor, and while there, it was noted that her blood pressure was 183/103. She was therefore sent here for evaluation. She is taking her medication, as prescribed, without relief. No recent fever or chills, sinus congestion, cough, chest pain, shortness of breath, or paresthesia. She is taking her usual medications, without relief. There are no other known modifying factors.   Past Medical History  Diagnosis Date  . Hypertension    No past surgical history on file. No family history on file. History  Substance Use Topics  . Smoking status: Never Smoker   . Smokeless tobacco: Not on file  . Alcohol Use: No   OB History   Grav Para Term Preterm Abortions TAB SAB Ect Mult Living                 Review of Systems  All other systems reviewed and are negative.     Allergies  Review of patient's allergies indicates no known allergies.  Home Medications   Prior to Admission medications   Medication Sig Start Date End Date Taking? Authorizing Provider  losartan-hydrochlorothiazide (HYZAAR) 50-12.5 MG per tablet Take 1 tablet by mouth daily.   Yes Historical Provider, MD   BP 137/87  Pulse 57  Temp(Src) 98.4 F (36.9 C) (Oral)  Resp 21  SpO2 99%  LMP 03/17/2014 Physical Exam  Nursing note and vitals reviewed. Constitutional: She is oriented to person, place, and time. She appears well-developed and well-nourished.   HENT:  Head: Normocephalic and atraumatic.  Eyes: Conjunctivae and EOM are normal. Pupils are equal, round, and reactive to light.  Neck: Normal range of motion and phonation normal. Neck supple.  Cardiovascular: Normal rate, regular rhythm and intact distal pulses.   Pulmonary/Chest: Effort normal and breath sounds normal. She exhibits no tenderness.  Abdominal: Soft. She exhibits no distension. There is no tenderness. There is no guarding.  Musculoskeletal: Normal range of motion.  No dysarthria, ataxia, or nystagmus. Normal finger-to-nose, and heel-to-shin, bilaterally. Normal strength and sensation in the arms, and legs, bilaterally.  Neurological: She is alert and oriented to person, place, and time. She exhibits normal muscle tone.  Skin: Skin is warm and dry.  Psychiatric: She has a normal mood and affect. Her behavior is normal. Judgment and thought content normal.    ED Course  Procedures (including critical care time) 12:33- Pt. Not a candidate for thrombolytics on arrival, because normal neuro exam, and symptoms for 2 weeks.   Medications - No data to display  Patient Vitals for the past 24 hrs:  BP Temp Temp src Pulse Resp SpO2  03/18/14 1545 137/87 mmHg - - 57 21 99 %  03/18/14 1515 133/85 mmHg - - 57 19 98 %  03/18/14 1500 138/87 mmHg - - 57 19 98 %  03/18/14 1445 149/85 mmHg - - 63 18 99 %  03/18/14 1444 - - - 59 12 99 %  03/18/14 1443 136/88 mmHg - - - - -  03/18/14 1205 134/77 mmHg 98.4 F (36.9 C) Oral 72 18 98 %    3:22 PM Reevaluation with update and discussion. After initial assessment and treatment, an updated evaluation reveals She is able to ambulate, easily. When walking she c/o dizzy feeling in right eye. She remains alert, conversant and comfortable. Findings discussed with pt and daughter. MR Brain ordered. [place] Review Labs Reviewed  CBC WITH DIFFERENTIAL - Abnormal; Notable for the following:    Eosinophils Relative 6 (*)     All other components within normal limits  BASIC METABOLIC PANEL - Abnormal; Notable for the following:    Potassium 3.2 (*)    Glucose, Bld 115 (*)    GFR calc non Af Amer 65 (*)    GFR calc Af Amer 75 (*)    All other components within normal limits  PRO B NATRIURETIC PEPTIDE  TROPONIN I  URINALYSIS, ROUTINE W REFLEX MICROSCOPIC    Imaging Review Dg Chest 2 View  03/18/2014   CLINICAL DATA:  Shortness of breath for 2 weeks. Lower extremity swelling.  EXAM: CHEST  2 VIEW  COMPARISON:  None.  FINDINGS: There is mild cardiomegaly without edema. The lungs are clear. No pneumothorax or pleural effusion.  IMPRESSION: Mild cardiomegaly without acute disease.   Electronically Signed   By: Inge Rise M.D.   On: 03/18/2014 14:13   Ct Head Wo Contrast  03/18/2014   CLINICAL DATA:  Hypertension, RIGHT leg pain, diffuse edema for 2 weeks, headache  EXAM: CT HEAD WITHOUT CONTRAST  TECHNIQUE: Contiguous axial images were obtained from the base of the skull through the vertex without intravenous contrast.  COMPARISON:  01/20/2008  FINDINGS: Normal ventricular morphology.  No midline shift or mass effect.  Normal appearance of brain parenchyma.  No intracranial hemorrhage, mass lesion, or acute infarction.  Visualized paranasal sinuses and mastoid air cells clear.  Bones unremarkable.  IMPRESSION: Normal exam.   Electronically Signed   By: Lavonia Dana M.D.   On: 03/18/2014 14:53     EKG Interpretation   Date/Time:  Saturday March 18 2014 12:48:01 EDT Ventricular Rate:  65 PR Interval:  145 QRS Duration: 96 QT Interval:  411 QTC Calculation: 427 R Axis:   84 Text Interpretation:  Sinus rhythm Borderline repolarization abnormality  No old tracing to compare Confirmed by [facility]  MD, Travone Georg [phone]) on  03/18/2014 3:06:01 PM      MDM   Final diagnoses:  Dizziness    Nonspecific heaviness, right-sided, with dizziness. Initial evaluation, negative for CVA, she required advanced imaging with  MR for persistent symptoms. If the MRI does not reveal CPA, she can be managed in the home setting, with further assessment by her primary care provider. If it shows a CVA, she will need to be admitted.  Nursing Notes Reviewed/ Care Coordinated Applicable Imaging Reviewed Interpretation of Laboratory Data incorporated into ED treatment  Plan_ Care to Dr. Thurnell Garbe at 16:20  Richarda Blade, MD 03/19/14 [phone]

## 2014-03-18 NOTE — ED Notes (Signed)
Pt sent to ED from [facility] with hypertension, right leg pain, diffuse edema x 2 week. States all symptoms started appx 2 weeks ago. States "my body just feels tired and heavy." Denies any chest pain. Neuro intact. AO x4.

## 2015-02-13 ENCOUNTER — Emergency Department (HOSPITAL_COMMUNITY): Payer: BLUE CROSS/BLUE SHIELD

## 2015-02-13 ENCOUNTER — Emergency Department (HOSPITAL_COMMUNITY)
Admission: EM | Admit: 2015-02-13 | Discharge: 2015-02-13 | Disposition: A | Discharge status: Home / Self Care | Payer: BLUE CROSS/BLUE SHIELD | Attending: Emergency Medicine | Admitting: Emergency Medicine

## 2015-02-13 ENCOUNTER — Encounter (HOSPITAL_COMMUNITY): Payer: Self-pay | Admitting: Emergency Medicine

## 2015-02-13 DIAGNOSIS — Y9389 Activity, other specified: Secondary | ICD-10-CM | POA: Diagnosis not present

## 2015-02-13 DIAGNOSIS — Z79899 Other long term (current) drug therapy: Secondary | ICD-10-CM | POA: Insufficient documentation

## 2015-02-13 DIAGNOSIS — S79912A Unspecified injury of left hip, initial encounter: Secondary | ICD-10-CM | POA: Diagnosis not present

## 2015-02-13 DIAGNOSIS — Z87448 Personal history of other diseases of urinary system: Secondary | ICD-10-CM | POA: Diagnosis not present

## 2015-02-13 DIAGNOSIS — M545 Low back pain, unspecified: Secondary | ICD-10-CM

## 2015-02-13 DIAGNOSIS — Y9289 Other specified places as the place of occurrence of the external cause: Secondary | ICD-10-CM | POA: Insufficient documentation

## 2015-02-13 DIAGNOSIS — Y998 Other external cause status: Secondary | ICD-10-CM | POA: Diagnosis not present

## 2015-02-13 DIAGNOSIS — Z3202 Encounter for pregnancy test, result negative: Secondary | ICD-10-CM | POA: Insufficient documentation

## 2015-02-13 DIAGNOSIS — W010XXA Fall on same level from slipping, tripping and stumbling without subsequent striking against object, initial encounter: Secondary | ICD-10-CM | POA: Insufficient documentation

## 2015-02-13 DIAGNOSIS — S3992XA Unspecified injury of lower back, initial encounter: Secondary | ICD-10-CM | POA: Diagnosis not present

## 2015-02-13 DIAGNOSIS — S3991XA Unspecified injury of abdomen, initial encounter: Secondary | ICD-10-CM | POA: Diagnosis present

## 2015-02-13 DIAGNOSIS — I10 Essential (primary) hypertension: Secondary | ICD-10-CM | POA: Diagnosis not present

## 2015-02-13 LAB — POC URINE PREG, ED: PREG TEST UR: NEGATIVE

## 2015-02-13 MED ORDER — NAPROXEN 500 MG PO TABS: 500.0000 mg | ORAL_TABLET | Freq: Two times a day (BID) | ORAL | Status: DC

## 2015-02-13 MED ORDER — HYDROCODONE-ACETAMINOPHEN 5-325 MG PO TABS
1.0000 | ORAL_TABLET | Freq: Once | ORAL | Status: AC
  Administered 2015-02-13: 1 via ORAL
  Filled 2015-02-13: qty 1

## 2015-02-13 MED ORDER — METHOCARBAMOL 500 MG PO TABS: 500.0000 mg | ORAL_TABLET | Freq: Two times a day (BID) | ORAL | Status: DC

## 2015-02-13 NOTE — ED Notes (Signed)
Pt reports she fell yesterday and was given pain meds that have been ineffective. Pt c/o left flank pain and is seeing specialist for kidney work up.

## 2015-02-13 NOTE — Discharge Instructions (Signed)
Naprosyn for pain. Robaxin for muscle spasms. Follow up with your doctor for recheck.   Xray showing mild arthritis.    C?ng th?t l?ng cng (Lumbosacral Strain) C?ng vng th?t l?ng cng l tnh tr?ng c?ng b?t k? b? ph?n no t?o nn nh?ng ??t s?ng th?t l?ng cng c?a qu v?. Cc ??t s?ng th?t l?ng cng c?a qu v? l nh?ng ph?n x??ng t?o nn m?t ph?n ba pha d??i x??ng s?ng. Cc ??t s?ng th?t l?ng cng c?a qu v? ???c g?n v?i nhau b?ng cc c? v m x? ch?c ch?n (dy ch?ng).  NGUYN NHN.  M?t c ?nh b?t ng? vo l?ng c th? gy c?ng th?t l?ng cng. Ngoi ra, b?t k? ?i?u g lm c?ng cc c? ? th?t l?ng qu m?c c?ng c th? gy ra ch?ng c?ng th?t l?ng cng ny. Tnh tr?ng ny th??ng th?y ? nh?ng ng??i g?ng s?c qu m?c, ng, nng v?t n?ng, g?p ng??i, ho?c ci xu?ng l?p ?i l?p l?i nhi?u l?n. CC Y?U T? NGUY C?  Cng vi?c ?i h?i s?c l?c.  Tham gia vo cc mn th? thao ??y ho?c ko ?i h?i ph?i v?n l?ng ??t ng?t (qu?n v?t, ?nh gn, bng chy).  Nng t?.  U?n cong qu m?c ph?n th?t l?ng.  Khung x??ng ch?u nghing v? pha tr??c.  Y?u c? l?ng ho?c y?u c? b?ng ho?c c? hai.  Gn kheo c?ng. D?U HI?U V TRI?U CH?NG  C?ng th?t l?ng cng c th? gy ?au ? vng b? ch?n th??ng ho?c c?n ?au di chuy?n (lan t?a) xu?ng chn qu v?.  CH?N ?ON Chuyn gia ch?m [place] s?c kh?e th??ng c th? ch?n ?on c?ng th?t l?ng cng b?ng cch khm th?c th?Rowe Robert m?t s? tr??ng h?p qu v? c th? c?n cc ki?m tra nh? ch?p X quang.  ?I?U TR?  Vi?c ?i?u tr? ch?n th??ng vng th?t l?ng ty thu?c vo nhi?u y?u t? m bc s? lm sng s? ph?i ?nh gi. Tuy nhin, h?u h?t vi?c ?i?u tr? s? bao g?m vi?c s? d?ng thu?c ch?ng vim. H??NG D?N CH?M [place] T?I NH   Trnh nh?ng ho?t ??ng th? ch?t n?ng (qu?n v?t, racquetball, l??t vn n??c) n?u qu v? khng c ?? tnh tr?ng th? l?c ?? th?c hi?n cc ho?t ??ng ?. ?i?u ny c th? lm tr?m tr?ng thm ho?c gy ra v?n ??.  N?u qu v? c m?t v?n ?? ? l?ng, hy trnh nh?ng mn th? thao ?i h?i ph?i c?  ??ng c? th? ??t ng?t. B?i v ?i b? th??ng l nh?ng ho?t ??ng an ton h?n.  Duy tr t? th? thch h?p.  Duy tr cn n?ng c l?i cho s?c kh?e.  ??i v?i nh?ng tnh tr?ng c?p tnh, qu v? c th? ch??m ? l?nh ln vng b? th??ng.  Cho ? l?nh vo ti nh?a.  ?? kh?n t?m vo gi?a da v ti.  Ch??m ? l?nh ln vng b? th??ng trong kho?ng 20 pht, 2 - 3 l?n m?i ngy.  Khi vng th?t l?ng b?t ??u lnh, c th? t?p cc bi t?p ko c?ng ho?c t?ng c??ng s?c kh?e. ?I KHM N?U:  ?au l?ng tr? nn t? h?n.  Qu v? b? ?au l?ng n?ng v khng ?? khi dng thu?c. NGAY L?P T?C ?I KHM N?U:   Qu v? b? t, ?au bu?t, y?u, ho?c cc v?n ?? khi c? ??ng tay ho?c chn.  C s? thay ??i trong vi?c ki?m sot ??i ti?n ho?c ti?u ti?n.  Qu v? c c?n ?  au t?ng ln ? b?t k? vng no c?a c? th?, k? c? ? vng b?ng (b?ng).  Qu v? th?y kh th?, chng m?t, ho?c c?m th?y mu?n ng?t.  Qu v? c?m th?y kh ch?u ? d? dy (bu?n nn), nn (nn m?a), ho?c ?? m? hi.  Qu v? th?y ??i m?u ngn chn ho?c chn, ho?c bn chn qu v? r?t l?nh. ??M B?O QU V?:   Hi?u r cc h??ng d?n ny.  S? theo di tnh tr?ng c?a mnh.  S? yu c?u tr? gip ngay l?p t?c n?u qu v? c?m th?y khng kh?e ho?c th?y tr?m tr?ng h?n. Document Released: 04/30/2005 Document Revised: 07/26/2013 [facility] Patient Information 2015 [place]. This information is not intended to replace advice given to you by your health care provider. Make sure you discuss any questions you have with your health care provider.

## 2015-02-13 NOTE — ED Provider Notes (Signed)
CSN: [id number]     Arrival date & time 02/13/15  1249 History   First MD Initiated Contact with Patient 02/13/15 1312     Chief Complaint  Patient presents with  . Flank Pain  . Hip Pain     (Consider location/radiation/quality/duration/timing/severity/associated sxs/prior Treatment) HPI H Linda Black is a 44 y.o. female with history of hypertension and renal insufficiency, presents to emergency department complaint of back pain. Patient states she was in the bathroom and slipped falling backwards. She states she landed on her back. She reports increased lower back pain since then. She went to see her primary care doctor where they gave her prescription for Cipro after they told her she may have a urinary tract infection after checking her urinalysis, she was prescribed omeprazole and Cipro which she did not fill or take yet. Patient states pain in the back is worsened with movement and positional changes. It does not radiate down her legs. She denies any abdominal pain. No nausea or vomiting. No fever or chills. Patient did not take anything for pain prior to coming in. Patient worsened today so she decided to calm to the ER to be checked out.  Past Medical History  Diagnosis Date  . Hypertension    History reviewed. No pertinent past surgical history. History reviewed. No pertinent family history. History  Substance Use Topics  . Smoking status: Never Smoker   . Smokeless tobacco: Not on file  . Alcohol Use: No   OB History    No data available     Review of Systems  Constitutional: Negative for fever and chills.  Respiratory: Negative for cough, chest tightness and shortness of breath.   Cardiovascular: Negative for chest pain, palpitations and leg swelling.  Gastrointestinal: Negative for nausea, vomiting, abdominal pain and diarrhea.  Genitourinary: Negative for dysuria, flank pain and pelvic pain.  Musculoskeletal: Positive for back pain. Negative for myalgias, neck pain and  neck stiffness.  Skin: Negative for rash.  Neurological: Negative for dizziness, weakness, numbness and headaches.  All other systems reviewed and are negative.     Allergies  Review of patient's allergies indicates no known allergies.  Home Medications   Prior to Admission medications   Medication Sig Start Date End Date Taking? Authorizing Provider  furosemide (LASIX) 40 MG tablet Take 40 mg by mouth 2 (two) times daily.   Yes Historical Provider, MD  lactulose (CHRONULAC) 10 GM/15ML solution Take 10 g by mouth 2 (two) times daily.   Yes Historical Provider, MD  losartan-hydrochlorothiazide (HYZAAR) 100-25 MG per tablet Take 1 tablet by mouth daily.   Yes Historical Provider, MD   BP 138/84 mmHg  Pulse 83  Temp(Src) 98.2 F (36.8 C) (Oral)  Resp 18  SpO2 100%  LMP  Physical Exam  Constitutional: She appears well-developed and well-nourished. No distress.  HENT:  Head: Normocephalic.  Eyes: Conjunctivae are normal.  Neck: Neck supple.  Cardiovascular: Normal rate, regular rhythm and normal heart sounds.   Pulmonary/Chest: Effort normal and breath sounds normal. No respiratory distress. She has no wheezes. She has no rales.  Abdominal: Soft. Bowel sounds are normal. She exhibits no distension. There is no tenderness. There is no rebound.  Musculoskeletal: She exhibits no edema.  Midline and diffuse perivertebral lumbar and perilumbar tenderness to palpation. Worse with forward flexion, back extension. No pain with bilateral straight leg raise.   Neurological: She is alert.  Normal external genitalia. Normal vaginal canal. Small thin white discharge. Cervix is normal, closed.  No CMT. No uterine or adnexal tenderness. No masses palpated.    Skin: Skin is warm and dry.  Psychiatric: She has a normal mood and affect. Her behavior is normal.  Nursing note and vitals reviewed.   ED Course  Procedures (including critical care time) Labs Review Labs Reviewed  POC URINE PREG,  ED    Imaging Review Dg Lumbar Spine Complete  02/13/2015   CLINICAL DATA:  Fall yesterday.  Low back pain.  Initial encounter.  EXAM: LUMBAR SPINE - COMPLETE 4+ VIEW  COMPARISON:  None.  FINDINGS: There is no evidence of lumbar spine fracture. Alignment is normal. Mild degenerative disc disease is seen at L3-4. No other significant bone abnormality identified .  IMPRESSION: No acute findings.  Mild L3-4 degenerative disc disease.   Electronically Signed   By: Earle Gell M.D.   On: 02/13/2015 15:00     EKG Interpretation None      MDM   Final diagnoses:  Midline low back pain without sciatica    Pt with fall yesterday. Complaining of lower back pain. No pain radiation. ttp diffusely over lower back. Neurovascularly intact. Lumbar spine negative. Home with pcp follow up. Will start on naprosyn and flexeril. Follow up with PCP.   Filed Vitals:   02/13/15 1258 02/13/15 1325 02/13/15 1523  BP: 138/84  129/63  Pulse: 83  93  Temp: 98.2 F (36.8 C)    TempSrc: Oral    Resp:  18 16  SpO2: 100%  98%       Jeannett Senior, PA-C 02/13/15 [place], MD 02/13/15 1537

## 2015-09-23 ENCOUNTER — Encounter (HOSPITAL_COMMUNITY): Payer: Self-pay | Admitting: Nurse Practitioner

## 2015-09-23 ENCOUNTER — Emergency Department (HOSPITAL_COMMUNITY): Payer: Medicaid Other

## 2015-09-23 ENCOUNTER — Emergency Department (HOSPITAL_COMMUNITY)
Admission: EM | Admit: 2015-09-23 | Discharge: 2015-09-23 | Disposition: A | Discharge status: Home / Self Care | Payer: Medicaid Other | Attending: Physician Assistant | Admitting: Physician Assistant

## 2015-09-23 DIAGNOSIS — E119 Type 2 diabetes mellitus without complications: Secondary | ICD-10-CM | POA: Diagnosis not present

## 2015-09-23 DIAGNOSIS — R600 Localized edema: Secondary | ICD-10-CM | POA: Diagnosis not present

## 2015-09-23 DIAGNOSIS — R079 Chest pain, unspecified: Secondary | ICD-10-CM | POA: Diagnosis not present

## 2015-09-23 DIAGNOSIS — Z79899 Other long term (current) drug therapy: Secondary | ICD-10-CM | POA: Insufficient documentation

## 2015-09-23 DIAGNOSIS — Z791 Long term (current) use of non-steroidal anti-inflammatories (NSAID): Secondary | ICD-10-CM | POA: Diagnosis not present

## 2015-09-23 DIAGNOSIS — R51 Headache: Secondary | ICD-10-CM | POA: Diagnosis not present

## 2015-09-23 DIAGNOSIS — I10 Essential (primary) hypertension: Secondary | ICD-10-CM | POA: Insufficient documentation

## 2015-09-23 DIAGNOSIS — Z87448 Personal history of other diseases of urinary system: Secondary | ICD-10-CM | POA: Insufficient documentation

## 2015-09-23 DIAGNOSIS — Z3202 Encounter for pregnancy test, result negative: Secondary | ICD-10-CM | POA: Insufficient documentation

## 2015-09-23 HISTORY — DX: Type 2 diabetes mellitus without complications: E11.9

## 2015-09-23 LAB — URINALYSIS, ROUTINE W REFLEX MICROSCOPIC
Bilirubin Urine: NEGATIVE
GLUCOSE, UA: NEGATIVE mg/dL
KETONES UR: NEGATIVE mg/dL
LEUKOCYTES UA: NEGATIVE
Nitrite: NEGATIVE
PH: 6.5 (ref 5.0–8.0)
Specific Gravity, Urine: 1.015 (ref 1.005–1.030)

## 2015-09-23 LAB — BASIC METABOLIC PANEL
Anion gap: 13 (ref 5–15)
BUN: 17 mg/dL (ref 6–20)
CO2: 21 mmol/L — ABNORMAL LOW (ref 22–32)
CREATININE: 1.17 mg/dL — AB (ref 0.44–1.00)
Calcium: 7.8 mg/dL — ABNORMAL LOW (ref 8.9–10.3)
Chloride: 99 mmol/L — ABNORMAL LOW (ref 101–111)
GFR calc Af Amer: >60 mL/min (ref >60)
GFR, EST NON AFRICAN AMERICAN: 56 mL/min — AB (ref >60)
Glucose, Bld: 80 mg/dL (ref 65–99)
Potassium: 2.8 mmol/L — ABNORMAL LOW (ref 3.5–5.1)
SODIUM: 133 mmol/L — AB (ref 135–145)

## 2015-09-23 LAB — TROPONIN I: TROPONIN I: 0.03 ng/mL (ref <0.031)

## 2015-09-23 LAB — URINE MICROSCOPIC-ADD ON
Bacteria, UA: NONE SEEN
Squamous Epithelial / LPF: NONE SEEN
WBC, UA: NONE SEEN WBC/hpf (ref 0–5)

## 2015-09-23 LAB — POC URINE PREG, ED: Preg Test, Ur: NEGATIVE

## 2015-09-23 MED ORDER — LOSARTAN POTASSIUM 50 MG PO TABS
100.0000 mg | ORAL_TABLET | Freq: Once | ORAL | Status: AC
  Administered 2015-09-23: 100 mg via ORAL
  Filled 2015-09-23 (×2): qty 2

## 2015-09-23 MED ORDER — HYDROCHLOROTHIAZIDE 25 MG PO TABS
25.0000 mg | ORAL_TABLET | Freq: Every day | ORAL | Status: DC
  Administered 2015-09-23: 25 mg via ORAL
  Filled 2015-09-23: qty 1

## 2015-09-23 MED ORDER — KETOROLAC TROMETHAMINE 15 MG/ML IJ SOLN
15.0000 mg | Freq: Once | INTRAMUSCULAR | Status: AC
  Administered 2015-09-23: 15 mg via INTRAVENOUS
  Filled 2015-09-23: qty 1

## 2015-09-23 MED ORDER — ACETAMINOPHEN 325 MG PO TABS
650.0000 mg | ORAL_TABLET | Freq: Once | ORAL | Status: AC
  Administered 2015-09-23: 650 mg via ORAL
  Filled 2015-09-23: qty 2

## 2015-09-23 MED ORDER — LOSARTAN POTASSIUM-HCTZ 100-25 MG PO TABS: 1.0000 | ORAL_TABLET | Freq: Every day | ORAL | Status: DC

## 2015-09-23 NOTE — ED Notes (Signed)
Med requested from pharmacy.

## 2015-09-23 NOTE — Discharge Instructions (Signed)
T?ng huy?t p (Hypertension) T?ng huy?t p, th??ng ???c g?i l huy?t p cao, l khi l?c b?m mu qua ??ng m?ch c?a qu v? qu m?nh. ??ng m?ch c?a qu v? l cc m?ch mu mang mu t? tim ?i kh?p c? th? c?a qu v?. K?t qu? ?o huy?t p c m?t con s? cao v m?t con s? th?p, ch?ng h?n nh? 110/72. Con s? cao (tm thu) l p l?c bn trong ??ng m?ch khi tim qu v? b?m. Con s? th?p (tm tr??ng) l p l?c bn trong ??ng m?ch khi tim qu v? gin ra. Huy?t p l t??ng c?n cho qu v? ph?i d??i 120/80. Ch?ng t?ng huy?t p bu?c tim qu v? ph?i lm vi?c v?t v? h?n ?? b?m mu. ??ng m?ch c?a qu v? c th? b? h?p ho?c c?ng. Huy?t p cao khng ???c ?i?u tr? ho?c khng ???c ki?m sot c th? d?n t?i nh?i mu c? tim, ??t qu?, b?nh th?n v nh?ng v?n ?? khc. CC Y?U T? NGUY C? M?t s? y?u t? nguy c? d?n ??n huy?t p cao c th? ki?m sot ???c. M?t s? y?u t? khc th khng.  Nh?ng y?u t? nguy c? khng th? ki?m sot ???c bao g?m:   Ch?ng t?c. Qu v? c nguy c? cao h?n n?u qu v? l ng??i M? g?c Phi.  ?? tu?i. Nguy c? t?ng ln theo ?? tu?i.  Gi?i tnh. Nam gi?i c nguy c? cao h?n ph? n? tr??c tu?i 45. Sau tu?i 65, ph? n? c nguy c? cao h?n nam gi?i. Nh?ng y?u t? nguy c? c th? ki?m sot ???c bao g?m:  Khng t?p th? d?c ho?c cc ho?t ??ng th? ch?t ??y ??Marland Kitchen  Th?a cn.  ?n qu nhi?u ch?t bo, ???ng, ca-lo, ho?c mu?i.  U?ng qu nhi?u r??u. D?U HI?U V TRI?U CH?NG T?ng huy?t p th??ng khng gy ra d?u hi?u ho?c tri?u ch?ng. Huy?t p r?t cao (c?n cao huy?t p) c th? gy ?au ??u, lo l?ng, kh th? v ch?y mu cam. CH?N ?ON ?? ki?m tra xem qu v? c t?ng huy?t p khng, chuyn gia ch?m [place] s?c kh?e c?a qu v? s? ?o huy?t p trong khi qu v? ng?i ??t tay ? m?c ngang v?i tim. Huy?t p c?n ???c ?o t nh?t hai l?n trn cng m?t cnh tay. M?t s? tnh tr?ng nh?t ??nh c th? lm cho huy?t p khc nhau gi?a tay ph?i v tay tri c?a qu v?. K?t qu? ?o huy?t p cao h?n bnh th??ng ? m?t th?i ?i?m no ? khng c ngh?a l qu v? c?n ?i?u  tr?Marland Kitchen N?u khng r li?u qu v? c huy?t p cao hay khng, qu v? c th? ???c ?? ngh? tr? l?i vo m?t ngy khc ?? ki?m tra l?i huy?t p. Ho?c qu v? c th? ???c yu c?u theo di huy?t p ? nh trong 1 tu?n ho?c h?n. ?I?U TR? ?i?u tr? huy?t p cao gao g?m thay ??i l?i s?ng v c th? ph?i dng thu?c. C m?t l?i s?ng lnh m?nh c th? gip lm gi?m huy?t p cao. Qu v? c th? c?n thay ??i m?t s? thi quen. Thay ??i l?i s?ng c th? bao g?m:  Th?c hi?n ch? ?? ?n DASH. Ch? ?? ?n ny c nhi?u tri cy, rau v ng? c?c nguyn h?t. C t mu?i, th?t ??, v t b? sung ???ng.  Duy tr l??ng mu?i tiu th? d??i 2.300 mg m?i ngy.  T?p aerobic t nh?t 30-45 pht t  nh?t 4 l?n m?i tu?n.  Gi?m cn n?u c?n thi?t.  Khng ht thu?c.  H?n ch? ?? u?ng c c?n.  H?c cc cch gi?m c?ng th?ng. Chuyn gia ch?m [place] s?c kh?e c th? k ??n thu?c n?u thay ??i l?i s?ng khng ?? ?? ??a huy?t p v? m?c c th? ki?m sot ???c v n?u m?t trong nh?ng ?i?u sau l ?ng:  Qu v? t? 18-59 tu?i v huy?t p tm thu c?a qu v? trn 140.  Qu v? t? 46 tu?i tr? ln v huy?t p tm thu c?a qu v? trn 150.  Huy?t p tm tr??ng c?a qu v? trn 90.  Qu v? b? ti?u ???ng v huy?t p tm thu c?a qu v? trn 140 ho?c huy?t p tm tr??ng c?a qu v? trn 90.  Qu v? b? b?nh th?n v huy?t p qu v? trn 140/90.  Qu v? b? b?nh tim v huy?t p qu v? trn 140/90. Huy?t p m?c tiu c nhn c?a qu v? c th? khc nhau ty thu?c v tnh tr?ng b?nh l, tu?i v cc nhn t? khc. H??NG D?N CH?M [place] T?I NH  Ki?m tra l?i huy?t p c?a qu v? theo ch? d?n c?a chuyn gia ch?m [place] s?c kh?e.  Ch? s? d?ng thu?c theo ch? d?n c?a chuyn gia ch?m [place] s?c kh?e. Lm theo ch? d?n m?t cch c?n th?n. Thu?c ?i?u tr? huy?t p ph?i ???c dng theo ??n ? k. Thu?c c?ng s? khng c tc d?ng khi qu v? b? li?u. Vi?c b? li?u thu?c c?ng lm qu v? c nguy c? pht sinh v?n ??Maggie Schwalbe ht thu?c.  Theo di huy?t p c?a qu v? ? nh theo ch? d?n c?a chuyn gia ch?m  [place] s?c kh?e. ?I KHM N?U:   Qu v? ngh? qu v? c ph?n ?ng v?i thu?c ?ang dng.  Qu v? b? ?au ??u ho?c c?m th?y chng m?t ti di?n.  Qu v? b? s?ng ph ? m?t c chn.  Qu v? c v?n ?? v? th? l?c. NGAY L?P T?C ?I KHM N?U:  Qu v? b? ?au ??u n?ng ho?c l l?n.  Qu v? b? y?u b?t th??ng, t b, ho?c c?m th?y nh? ng?t x?u.  Qu v? b? ?au ng?c ho?c ?au b?ng r?t nhi?u.  Qu v? nn nhi?u l?n.  Qu v? b? kh th?. ??M B?O QU V?:   Hi?u r cc h??ng d?n ny.  S? theo di tnh tr?ng c?a mnh.  S? yu c?u tr? gip ngay l?p t?c n?u qu v? c?m th?y khng kh?e ho?c th?y tr?m tr?ng h?n.   Thng tin ny khng nh?m m?c ?ch thay th? cho l?i khuyn m chuyn gia ch?m [place] s?c kh?e ni v?i qu v?. Hy b?o ??m qu v? ph?i th?o lu?n b?t k? v?n ?? g m qu v? c v?i chuyn gia ch?m  s?c kh?e c?a qu v?.   Document Released: 07/21/2005 Document Revised: 04/11/2015 Elsevier Interactive Patient Education [employer].

## 2015-09-23 NOTE — ED Provider Notes (Signed)
CSN: [id number]     Arrival date & time 09/23/15  1428 History   First MD Initiated Contact with Patient 09/23/15 1726     Chief Complaint  Patient presents with  . Hypertension   Patient is a 45 y.o. female presenting with hypertension. The history is provided by the patient and a relative. A language interpreter was used.  Hypertension This is a chronic problem. The current episode started 1 to 4 weeks ago. The problem occurs constantly. The problem has been gradually worsening. Associated symptoms include chest pain and headaches. Pertinent negatives include no abdominal pain, chills, congestion, coughing, fever, joint swelling, nausea, neck pain, numbness, rash, sore throat, vomiting or weakness. Exacerbated by: Ran out of meds. She has tried nothing for the symptoms. The treatment provided no relief.    Past Medical History  Diagnosis Date  . Hypertension   . Renal disorder   . Diabetes mellitus without complication ([place])    History reviewed. No pertinent past surgical history. History reviewed. No pertinent family history. Social History  Substance Use Topics  . Smoking status: Never Smoker   . Smokeless tobacco: None  . Alcohol Use: No   OB History    No data available     Review of Systems  Constitutional: Negative for fever, chills, activity change and appetite change.  HENT: Negative for congestion, dental problem, ear pain, facial swelling, hearing loss, rhinorrhea, sneezing, sore throat, trouble swallowing and voice change.   Eyes: Negative for photophobia, pain, redness and visual disturbance.  Respiratory: Negative for apnea, cough, chest tightness, shortness of breath, wheezing and stridor.   Cardiovascular: Positive for chest pain and leg swelling. Negative for palpitations.  Gastrointestinal: Negative for nausea, vomiting, abdominal pain, diarrhea, constipation, blood in stool and abdominal distention.  Endocrine: Negative for polydipsia and polyuria.   Genitourinary: Negative for frequency, hematuria, flank pain, decreased urine volume and difficulty urinating.  Musculoskeletal: Negative for back pain, joint swelling, gait problem, neck pain and neck stiffness.  Skin: Negative for rash and wound.  Allergic/Immunologic: Negative for immunocompromised state.  Neurological: Positive for headaches. Negative for dizziness, syncope, facial asymmetry, speech difficulty, weakness, light-headedness and numbness.  Hematological: Negative for adenopathy.  Psychiatric/Behavioral: Negative for suicidal ideas, behavioral problems, confusion, sleep disturbance and agitation. The patient is not nervous/anxious.   All other systems reviewed and are negative.     Allergies  Review of patient's allergies indicates no known allergies.  Home Medications   Prior to Admission medications   Medication Sig Start Date End Date Taking? Authorizing Provider  furosemide (LASIX) 40 MG tablet Take 40 mg by mouth 2 (two) times daily.    Historical Provider, MD  lactulose (CHRONULAC) 10 GM/15ML solution Take 10 g by mouth 2 (two) times daily.    Historical Provider, MD  losartan-hydrochlorothiazide (HYZAAR) 100-25 MG tablet Take 1 tablet by mouth daily. 09/23/15   Vira Blanco, MD  methocarbamol (ROBAXIN) 500 MG tablet Take 1 tablet (500 mg total) by mouth 2 (two) times daily. 02/13/15   Tatyana Kirichenko, PA-C  naproxen (NAPROSYN) 500 MG tablet Take 1 tablet (500 mg total) by mouth 2 (two) times daily. 02/13/15   Tatyana Kirichenko, PA-C   BP 193/94 mmHg  Pulse 62  Temp(Src) 98.1 F (36.7 C) (Oral)  Resp 18  Ht 5' (1.524 m)  Wt 84.46 kg  BMI 36.36 kg/m2  SpO2 97%  LMP 09/01/2015 Physical Exam  Constitutional: She is oriented to person, place, and time. She appears well-developed and well-nourished. No  distress.  HENT:  Head: Normocephalic and atraumatic.  Right Ear: External ear normal.  Left Ear: External ear normal.  Eyes: Pupils are equal, round, and  reactive to light. Right eye exhibits no discharge. Left eye exhibits no discharge.  Neck: Normal range of motion. No JVD present. No tracheal deviation present.  Cardiovascular: Normal rate, regular rhythm and normal heart sounds.  Exam reveals no friction rub.   No murmur heard. Pulmonary/Chest: Effort normal and breath sounds normal. No stridor. No respiratory distress. She has no wheezes.  Abdominal: Soft. Bowel sounds are normal. She exhibits no distension. There is no rebound and no guarding.  Musculoskeletal: Normal range of motion. She exhibits edema. She exhibits no tenderness.  Lymphadenopathy:    She has no cervical adenopathy.  Neurological: She is alert and oriented to person, place, and time. No cranial nerve deficit. Coordination normal.  Skin: Skin is warm and dry. No rash noted. No pallor.  Psychiatric: She has a normal mood and affect. Her behavior is normal. Judgment and thought content normal.  Nursing note and vitals reviewed.   ED Course  Procedures (including critical care time) Labs Review Labs Reviewed  BASIC METABOLIC PANEL - Abnormal; Notable for the following:    Sodium 133 (*)    Potassium 2.8 (*)    Chloride 99 (*)    CO2 21 (*)    Creatinine, Ser 1.17 (*)    Calcium 7.8 (*)    GFR calc non Af Amer 56 (*)    All other components within normal limits  URINALYSIS, ROUTINE W REFLEX MICROSCOPIC (NOT AT [facility]) - Abnormal; Notable for the following:    Hgb urine dipstick MODERATE (*)    Protein, ur >300 (*)    All other components within normal limits  TROPONIN I  URINE MICROSCOPIC-ADD ON  POC URINE PREG, ED    Imaging Review Ct Head Wo Contrast  09/23/2015  CLINICAL DATA:  One week history of headache.  Hypertension. EXAM: CT HEAD WITHOUT CONTRAST TECHNIQUE: Contiguous axial images were obtained from the base of the skull through the vertex without intravenous contrast. COMPARISON:  03/18/2014 FINDINGS: The brain has a normal appearance without evidence  of malformation, atrophy, old or acute infarction, mass lesion, hemorrhage, hydrocephalus or extra-axial collection. The calvarium is unremarkable. The paranasal sinuses, middle ears and mastoids are clear. IMPRESSION: Normal head CT Electronically Signed   By: Nelson Chimes M.D.   On: 09/23/2015 19:30   I have personally reviewed and evaluated these images and lab results as part of my medical decision-making.   EKG Interpretation None      MDM   Final diagnoses:  Essential hypertension    Patient is a 79 old female who presents for evaluation of hypertension, headache. Patient off of her antihypertensives for 2 weeks due to trouble with her insurance.  Blood pressure 212/103 upon arrival. Heart rate normal. Cranial nerve exam normal no strength or sensation deficits.  Differential diagnosis includes hypertensive emergency, hypertensive urgency, central hypertension.  Patient given losartan hydrochlorothiazide here in the emergency department with improvement improvement in her blood pressure.  CT head normal, troponin negative, creatinine 1.17.  She given Tylenol and Toradol ED for headache with improvement in her symptoms.  Patient was discharged with a refill on her losartan hydrochlorothiazide was encouraged take potassium supplements at home. Patient voiced understanding agreement with plan of care and was ambulatory in no acute distress at time of discharge.  Discussed with Dr. Thomasene Lot.  Vira Blanco, MD 09/24/15 0004  Courteney Julio Alm, MD 09/24/15 [id number]

## 2015-09-23 NOTE — ED Notes (Signed)
Pt reports 1 week history of BLE swelling and headaches. She has been out of her BP medication for 2 weeks now because she did not have time to ask her doctor for a refill. She denies any cp or sob. She is A&O, breathing easily. She speaks montagnard.

## 2015-09-23 NOTE — ED Notes (Signed)
IV attempt x 2, another RN to attempt.

## 2015-09-25 MED FILL — LOSARTAN-HCTZ 100-25 MG TAB: 100-25 | 14 days supply | Qty: 14 | Fill #0

## 2015-11-18 ENCOUNTER — Encounter (HOSPITAL_COMMUNITY): Payer: Self-pay | Admitting: *Deleted

## 2015-11-18 ENCOUNTER — Emergency Department (HOSPITAL_COMMUNITY): Payer: Medicaid Other

## 2015-11-18 ENCOUNTER — Emergency Department (HOSPITAL_COMMUNITY)
Admission: EM | Admit: 2015-11-18 | Discharge: 2015-11-18 | Disposition: A | Discharge status: Home / Self Care | Payer: Medicaid Other | Attending: Emergency Medicine | Admitting: Emergency Medicine

## 2015-11-18 DIAGNOSIS — E119 Type 2 diabetes mellitus without complications: Secondary | ICD-10-CM | POA: Diagnosis not present

## 2015-11-18 DIAGNOSIS — I951 Orthostatic hypotension: Secondary | ICD-10-CM | POA: Diagnosis not present

## 2015-11-18 DIAGNOSIS — I10 Essential (primary) hypertension: Secondary | ICD-10-CM | POA: Diagnosis not present

## 2015-11-18 DIAGNOSIS — N289 Disorder of kidney and ureter, unspecified: Secondary | ICD-10-CM | POA: Insufficient documentation

## 2015-11-18 DIAGNOSIS — E876 Hypokalemia: Secondary | ICD-10-CM | POA: Insufficient documentation

## 2015-11-18 DIAGNOSIS — R0602 Shortness of breath: Secondary | ICD-10-CM | POA: Diagnosis not present

## 2015-11-18 DIAGNOSIS — Z79899 Other long term (current) drug therapy: Secondary | ICD-10-CM | POA: Insufficient documentation

## 2015-11-18 DIAGNOSIS — Z3202 Encounter for pregnancy test, result negative: Secondary | ICD-10-CM | POA: Insufficient documentation

## 2015-11-18 DIAGNOSIS — M7989 Other specified soft tissue disorders: Secondary | ICD-10-CM | POA: Diagnosis present

## 2015-11-18 DIAGNOSIS — G8929 Other chronic pain: Secondary | ICD-10-CM | POA: Diagnosis not present

## 2015-11-18 LAB — URINALYSIS, ROUTINE W REFLEX MICROSCOPIC
Bilirubin Urine: NEGATIVE
Glucose, UA: NEGATIVE mg/dL
Ketones, ur: NEGATIVE mg/dL
Leukocytes, UA: NEGATIVE
Nitrite: NEGATIVE
Protein, ur: 100 mg/dL — AB
SPECIFIC GRAVITY, URINE: 1.007 (ref 1.005–1.030)
pH: 6.5 (ref 5.0–8.0)

## 2015-11-18 LAB — COMPREHENSIVE METABOLIC PANEL
ALT: 23 U/L (ref 14–54)
AST: 20 U/L (ref 15–41)
Albumin: 3.4 g/dL — ABNORMAL LOW (ref 3.5–5.0)
Alkaline Phosphatase: 63 U/L (ref 38–126)
Anion gap: 10 (ref 5–15)
BILIRUBIN TOTAL: 0.6 mg/dL (ref 0.3–1.2)
BUN: 23 mg/dL — AB (ref 6–20)
CALCIUM: 8.4 mg/dL — AB (ref 8.9–10.3)
CO2: 26 mmol/L (ref 22–32)
Chloride: 101 mmol/L (ref 101–111)
Creatinine, Ser: 1.36 mg/dL — ABNORMAL HIGH (ref 0.44–1.00)
GFR calc Af Amer: 54 mL/min — ABNORMAL LOW (ref >60)
GFR calc non Af Amer: 47 mL/min — ABNORMAL LOW (ref >60)
GLUCOSE: 83 mg/dL (ref 65–99)
POTASSIUM: 2.8 mmol/L — AB (ref 3.5–5.1)
Sodium: 137 mmol/L (ref 135–145)
Total Protein: 6.5 g/dL (ref 6.5–8.1)

## 2015-11-18 LAB — CBC WITH DIFFERENTIAL/PLATELET
Basophils Absolute: 0 10*3/uL (ref 0.0–0.1)
Basophils Relative: 0 %
EOS PCT: 5 %
Eosinophils Absolute: 0.4 10*3/uL (ref 0.0–0.7)
HCT: 38.1 % (ref 36.0–46.0)
Hemoglobin: 13.3 g/dL (ref 12.0–15.0)
LYMPHS PCT: 24 %
Lymphs Abs: 2.2 10*3/uL (ref 0.7–4.0)
MCH: 29.6 pg (ref 26.0–34.0)
MCHC: 34.9 g/dL (ref 30.0–36.0)
MCV: 84.9 fL (ref 78.0–100.0)
MONO ABS: 0.6 10*3/uL (ref 0.1–1.0)
MONOS PCT: 7 %
NEUTROS ABS: 6 10*3/uL (ref 1.7–7.7)
Neutrophils Relative %: 64 %
Platelets: 365 10*3/uL (ref 150–400)
RBC: 4.49 MIL/uL (ref 3.87–5.11)
RDW: 12.4 % (ref 11.5–15.5)
WBC: 9.3 10*3/uL (ref 4.0–10.5)

## 2015-11-18 LAB — I-STAT TROPONIN, ED: Troponin i, poc: 0.01 ng/mL (ref 0.00–0.08)

## 2015-11-18 LAB — BRAIN NATRIURETIC PEPTIDE: B Natriuretic Peptide: 44.3 pg/mL (ref 0.0–100.0)

## 2015-11-18 LAB — PREGNANCY, URINE: PREG TEST UR: NEGATIVE

## 2015-11-18 LAB — URINE MICROSCOPIC-ADD ON

## 2015-11-18 MED ORDER — FUROSEMIDE 40 MG PO TABS: 40.0000 mg | ORAL_TABLET | Freq: Every day | ORAL | Status: DC

## 2015-11-18 MED ORDER — SODIUM CHLORIDE 0.9 % IV BOLUS (SEPSIS)
1000.0000 mL | Freq: Once | INTRAVENOUS | Status: AC
  Administered 2015-11-18: 1000 mL via INTRAVENOUS

## 2015-11-18 MED ORDER — POTASSIUM CHLORIDE CRYS ER 20 MEQ PO TBCR
40.0000 meq | EXTENDED_RELEASE_TABLET | Freq: Once | ORAL | Status: AC
  Administered 2015-11-18: 40 meq via ORAL
  Filled 2015-11-18: qty 2

## 2015-11-18 MED ORDER — POTASSIUM CHLORIDE CRYS ER 20 MEQ PO TBCR: 20.0000 meq | EXTENDED_RELEASE_TABLET | Freq: Every day | ORAL | Status: DC

## 2015-11-18 MED ORDER — POTASSIUM CHLORIDE 10 MEQ/100ML IV SOLN
10.0000 meq | INTRAVENOUS | Status: AC
  Administered 2015-11-18 (×2): 10 meq via INTRAVENOUS
  Filled 2015-11-18 (×2): qty 100

## 2015-11-18 NOTE — ED Notes (Addendum)
Pt's daughter reports bila LE swelling x 3 weeks and SOB x 1 week, worse today.  Pt also reports low back pain and hematuria.  Pt also reports her legs feels hot and are itchy.  Pt has been taking lasix 40mg  BID x 6 months without relief.  States she has not been to see her PCP.  Daughter is requesting a referral for a kidney MD.

## 2015-11-18 NOTE — ED Provider Notes (Signed)
CSN: [id number]     Arrival date & time 11/18/15  1033 History   First MD Initiated Contact with Patient 11/18/15 1248     Chief Complaint  Patient presents with  . Shortness of Breath  . Leg Swelling     (Consider location/radiation/quality/duration/timing/severity/associated sxs/prior Treatment) HPI Patient presents with bilateral lower extremity swelling 3 weeks and increased dyspnea on exertion for the past week. She takes Lasix 40 mg twice a day but has not had any improvement. She denies any fever or chills. No cough or chest pain. Reports hematuria and ongoing chronic left-sided low back pain. Pain does not radiate. Past Medical History  Diagnosis Date  . Hypertension   . Renal disorder   . Diabetes mellitus without complication ([place])    History reviewed. No pertinent past surgical history. No family history on file. Social History  Substance Use Topics  . Smoking status: Never Smoker   . Smokeless tobacco: None  . Alcohol Use: No   OB History    No data available     Review of Systems  Constitutional: Positive for fatigue. Negative for fever and chills.  Respiratory: Positive for shortness of breath. Negative for cough and wheezing.   Cardiovascular: Positive for leg swelling. Negative for chest pain and palpitations.  Gastrointestinal: Negative for nausea, vomiting, abdominal pain, diarrhea and constipation.  Genitourinary: Positive for frequency. Negative for dysuria, flank pain and pelvic pain.  Musculoskeletal: Positive for myalgias and back pain. Negative for neck pain and neck stiffness.  Skin: Negative for rash and wound.  Neurological: Negative for dizziness, weakness, light-headedness, numbness and headaches.  All other systems reviewed and are negative.     Allergies  Review of patient's allergies indicates no known allergies.  Home Medications   Prior to Admission medications   Medication Sig Start Date End Date Taking? Authorizing Provider   losartan-hydrochlorothiazide (HYZAAR) 100-25 MG tablet Take 1 tablet by mouth daily. 09/23/15  Yes Vira Blanco, MD  furosemide (LASIX) 40 MG tablet Take 1 tablet (40 mg total) by mouth daily. 11/18/15   Julianne Rice, MD  methocarbamol (ROBAXIN) 500 MG tablet Take 1 tablet (500 mg total) by mouth 2 (two) times daily. Patient not taking: Reported on 11/18/2015 02/13/15   Tatyana Kirichenko, PA-C  naproxen (NAPROSYN) 500 MG tablet Take 1 tablet (500 mg total) by mouth 2 (two) times daily. Patient not taking: Reported on 11/18/2015 02/13/15   Tatyana Kirichenko, PA-C  potassium chloride SA (K-DUR,KLOR-CON) 20 MEQ tablet Take 1 tablet (20 mEq total) by mouth daily. 11/18/15   Julianne Rice, MD   BP 113/92 mmHg  Pulse 67  Temp(Src) 98.4 F (36.9 C) (Oral)  Resp 18  SpO2 100%  LMP 11/16/2015 Physical Exam  Constitutional: She is oriented to person, place, and time. She appears well-developed and well-nourished. No distress.  HENT:  Head: Normocephalic and atraumatic.  Mouth/Throat: Oropharynx is clear and moist.  Mild upper eyelid swelling bilaterally.  Eyes: Conjunctivae and EOM are normal. Pupils are equal, round, and reactive to light. Right eye exhibits no discharge. Left eye exhibits no discharge.  Neck: Normal range of motion. Neck supple. No JVD present.  Cardiovascular: Normal rate and regular rhythm.  Exam reveals no gallop and no friction rub.   No murmur heard. Pulmonary/Chest: Effort normal and breath sounds normal. No respiratory distress. She has no wheezes. She has no rales. She exhibits no tenderness.  Abdominal: Soft. Bowel sounds are normal. She exhibits no distension and no mass. There is no  tenderness. There is no rebound and no guarding.  Musculoskeletal: Normal range of motion. She exhibits tenderness. She exhibits no edema.  Mild left paraspinal lumbar tenderness to palpation. No midline thoracic or lumbar tenderness. No CVA tenderness. Patient has diffuse lower  extremity swelling to the knees. Some pitting edema is noted. 2+ dorsalis pedis pulses  Neurological: She is alert and oriented to person, place, and time.  5/5 motor in all extremity. Sensation is fully intact.  Skin: Skin is warm and dry. No rash noted. No erythema.  Psychiatric: She has a normal mood and affect. Her behavior is normal.  Nursing note and vitals reviewed.   ED Course  Procedures (including critical care time) Labs Review Labs Reviewed  COMPREHENSIVE METABOLIC PANEL - Abnormal; Notable for the following:    Potassium 2.8 (*)    BUN 23 (*)    Creatinine, Ser 1.36 (*)    Calcium 8.4 (*)    Albumin 3.4 (*)    GFR calc non Af Amer 47 (*)    GFR calc Af Amer 54 (*)    All other components within normal limits  URINALYSIS, ROUTINE W REFLEX MICROSCOPIC (NOT AT [facility]) - Abnormal; Notable for the following:    Color, Urine STRAW (*)    Hgb urine dipstick SMALL (*)    Protein, ur 100 (*)    All other components within normal limits  URINE MICROSCOPIC-ADD ON - Abnormal; Notable for the following:    Squamous Epithelial / LPF 0-5 (*)    Bacteria, UA RARE (*)    All other components within normal limits  CBC WITH DIFFERENTIAL/PLATELET  BRAIN NATRIURETIC PEPTIDE  PREGNANCY, URINE  I-STAT TROPOININ, ED    Imaging Review No results found. I have personally reviewed and evaluated these images and lab results as part of my medical decision-making.   EKG Interpretation   Date/Time:  Sunday November 18 2015 11:53:39 EDT Ventricular Rate:  78 PR Interval:  132 QRS Duration: 103 QT Interval:  457 QTC Calculation: 521 R Axis:   49 Text Interpretation:  Sinus rhythm Borderline T abnormalities, anterior  leads Prolonged QT interval Confirmed by Lita Mains  MD, Kie Calvin ([id number]) on  11/19/2015 8:46:41 PM      MDM   Final diagnoses:  Renal insufficiency  Hypokalemia  Orthostasis    Patient also states she's been giving lightheaded especially with standing. Her creatinine  as increased and she is hypokalemic. I think the patient likely is intravascularly dehydrated. We'll give gentle IV fluids and replace potassium. Concern for nephrotic syndrome. Patient will need to follow-up with nephrology. She has an appointment next month. Discussed with patient about decreasing her Lasix to only 40 mg a day and will need to be started on potassium replacement.  Return precautions given.    Julianne Rice, MD 11/20/15 [phone]

## 2015-11-18 NOTE — Discharge Instructions (Signed)
Hypokalemia Hypokalemia means that the amount of potassium in the blood is lower than normal.Potassium is a chemical, called an electrolyte, that helps regulate the amount of fluid in the body. It also stimulates muscle contraction and helps nerves function properly.Most of the body's potassium is inside of cells, and only a very small amount is in the blood. Because the amount in the blood is so small, minor changes can be life-threatening. CAUSES  Antibiotics.  Diarrhea or vomiting.  Using laxatives too much, which can cause diarrhea.  Chronic kidney disease.  Water pills (diuretics).  Eating disorders (bulimia).  Low magnesium level.  Sweating a lot. SIGNS AND SYMPTOMS  Weakness.  Constipation.  Fatigue.  Muscle cramps.  Mental confusion.  Skipped heartbeats or irregular heartbeat (palpitations).  Tingling or numbness. DIAGNOSIS  Your health care provider can diagnose hypokalemia with blood tests. In addition to checking your potassium level, your health care provider may also check other lab tests. TREATMENT Hypokalemia can be treated with potassium supplements taken by mouth or adjustments in your current medicines. If your potassium level is very low, you may need to get potassium through a vein (IV) and be monitored in the hospital. A diet high in potassium is also helpful. Foods high in potassium are:  Nuts, such as peanuts and pistachios.  Seeds, such as sunflower seeds and pumpkin seeds.  Peas, lentils, and lima beans.  Whole grain and bran cereals and breads.  Fresh fruit and vegetables, such as apricots, avocado, bananas, cantaloupe, kiwi, oranges, tomatoes, asparagus, and potatoes.  Orange and tomato juices.  Red meats.  Fruit yogurt. HOME CARE INSTRUCTIONS  Take all medicines as prescribed by your health care provider.  Maintain a healthy diet by including nutritious food, such as fruits, vegetables, nuts, whole grains, and lean meats.  If  you are taking a laxative, be sure to follow the directions on the label. SEEK MEDICAL CARE IF:  Your weakness gets worse.  You feel your heart pounding or racing.  You are vomiting or having diarrhea.  You are diabetic and having trouble keeping your blood glucose in the normal range. SEEK IMMEDIATE MEDICAL CARE IF:  You have chest pain, shortness of breath, or dizziness.  You are vomiting or having diarrhea for more than 2 days.  You faint. MAKE SURE YOU:   Understand these instructions.  Will watch your condition.  Will get help right away if you are not doing well or get worse.   This information is not intended to replace advice given to you by your health care provider. Make sure you discuss any questions you have with your health care provider.   Document Released: 07/21/2005 Document Revised: 08/11/2014 Document Reviewed: 01/21/2013 Elsevier Interactive Patient Education 2016 Elsevier Inc.  Orthostatic Hypotension Orthostatic hypotension is a sudden drop in blood pressure. It happens when you quickly stand up from a seated or lying position. You may feel dizzy or light-headed. This can last for just a few seconds or for up to a few minutes. It is usually not a serious problem. However, if this happens frequently or gets worse, it can be a sign of something more serious. CAUSES  Different things can cause orthostatic hypotension, including:   Loss of body fluids (dehydration).  Medicines that lower blood pressure.  Sudden changes in posture, such as standing up quickly after you have been sitting or lying down.  Taking too much of your medicine. SIGNS AND SYMPTOMS   Light-headedness or dizziness.   Fainting or  near-fainting.   A fast heart rate.   Weakness.   Feeling tired (fatigue).  DIAGNOSIS  Your health care provider may do several things to help diagnose your condition and identify the cause. These may include:   Taking a medical history and  doing a physical exam.  Checking your blood pressure. Your health care provider will check your blood pressure when you are:  Lying down.  Sitting.  Standing.  Using tilt table testing. In this test, you lie down on a table that moves from a lying position to a standing position. You will be strapped onto the table. This test monitors your blood pressure and heart rate when you are in different positions. TREATMENT  Treatment will vary depending on the cause. Possible treatments include:   Changing the dosage of your medicines.  Wearing compression stockings on your lower legs.  Standing up slowly after sitting or lying down.  Eating more salt.  Eating frequent, small meals.  In some cases, getting IV fluids.  Taking medicine to enhance fluid retention. HOME CARE INSTRUCTIONS  Only take over-the-counter or prescription medicines as directed by your health care provider.  Follow your health care provider's instructions for changing the dosage of your current medicines.  Do not stop or adjust your medicine on your own.  Stand up slowly after sitting or lying down. This allows your body to adjust to the different position.  Wear compression stockings as directed.  Eat extra salt as directed.  Do not add extra salt to your diet unless directed to by your health care provider.  Eat frequent, small meals.  Avoid standing suddenly after eating.  Avoid hot showers or excessive heat as directed by your health care provider.  Keep all follow-up appointments. SEEK MEDICAL CARE IF:  You continue to feel dizzy or light-headed after standing.  You feel groggy or confused.  You feel cold, clammy, or sick to your stomach (nauseous).  You have blurred vision.  You feel short of breath. SEEK IMMEDIATE MEDICAL CARE IF:   You faint after standing.  You have chest pain.  You have difficulty breathing.   You lose feeling or movement in your arms or legs.   You  have slurred speech or difficulty talking, or you are unable to talk.  MAKE SURE YOU:   Understand these instructions.  Will watch your condition.  Will get help right away if you are not doing well or get worse.   This information is not intended to replace advice given to you by your health care provider. Make sure you discuss any questions you have with your health care provider.   Document Released: 07/11/2002 Document Revised: 07/26/2013 Document Reviewed: 05/13/2013 Elsevier Interactive Patient Education 2016 Elsevier Inc. Edema Edema is an abnormal buildup of fluids in your bodytissues. Edema is somewhatdependent on gravity to pull the fluid to the lowest place in your body. That makes the condition more common in the legs and thighs (lower extremities). Painless swelling of the feet and ankles is common and becomes more likely as you get older. It is also common in looser tissues, like around your eyes.  When the affected area is squeezed, the fluid may move out of that spot and leave a dent for a few moments. This dent is called pitting.  CAUSES  There are many possible causes of edema. Eating too much salt and being on your feet or sitting for a long time can cause edema in your legs and ankles. Hot  weather may make edema worse. Common medical causes of edema include:  Heart failure.  Liver disease.  Kidney disease.  Weak blood vessels in your legs.  Cancer.  An injury.  Pregnancy.  Some medications.  Obesity. SYMPTOMS  Edema is usually painless.Your skin may look swollen or shiny.  DIAGNOSIS  Your health care provider may be able to diagnose edema by asking about your medical history and doing a physical exam. You may need to have tests such as X-rays, an electrocardiogram, or blood tests to check for medical conditions that may cause edema.  TREATMENT  Edema treatment depends on the cause. If you have heart, liver, or kidney disease, you need the treatment  appropriate for these conditions. General treatment may include:  Elevation of the affected body part above the level of your heart.  Compression of the affected body part. Pressure from elastic bandages or support stockings squeezes the tissues and forces fluid back into the blood vessels. This keeps fluid from entering the tissues.  Restriction of fluid and salt intake.  Use of a water pill (diuretic). These medications are appropriate only for some types of edema. They pull fluid out of your body and make you urinate more often. This gets rid of fluid and reduces swelling, but diuretics can have side effects. Only use diuretics as directed by your health care provider. HOME CARE INSTRUCTIONS   Keep the affected body part above the level of your heart when you are lying down.   Do not sit still or stand for prolonged periods.   Do not put anything directly under your knees when lying down.  Do not wear constricting clothing or garters on your upper legs.   Exercise your legs to work the fluid back into your blood vessels. This may help the swelling go down.   Wear elastic bandages or support stockings to reduce ankle swelling as directed by your health care provider.   Eat a low-salt diet to reduce fluid if your health care provider recommends it.   Only take medicines as directed by your health care provider. SEEK MEDICAL CARE IF:   Your edema is not responding to treatment.  You have heart, liver, or kidney disease and notice symptoms of edema.  You have edema in your legs that does not improve after elevating them.   You have sudden and unexplained weight gain. SEEK IMMEDIATE MEDICAL CARE IF:   You develop shortness of breath or chest pain.   You cannot breathe when you lie down.  You develop pain, redness, or warmth in the swollen areas.   You have heart, liver, or kidney disease and suddenly get edema.  You have a fever and your symptoms suddenly get  worse. MAKE SURE YOU:   Understand these instructions.  Will watch your condition.  Will get help right away if you are not doing well or get worse.   This information is not intended to replace advice given to you by your health care provider. Make sure you discuss any questions you have with your health care provider.   Document Released: 07/21/2005 Document Revised: 08/11/2014 Document Reviewed: 05/13/2013 Elsevier Interactive Patient Education [employer].

## 2015-12-24 ENCOUNTER — Ambulatory Visit (INDEPENDENT_AMBULATORY_CARE_PROVIDER_SITE_OTHER): Payer: BLUE CROSS/BLUE SHIELD | Admitting: Medical

## 2015-12-24 ENCOUNTER — Encounter: Payer: Self-pay | Admitting: Medical

## 2015-12-24 ENCOUNTER — Ambulatory Visit: Payer: Self-pay | Admitting: Medical

## 2015-12-24 VITALS — BP 164/96 | HR 77 | Wt 184.0 lb

## 2015-12-24 DIAGNOSIS — I10 Essential (primary) hypertension: Secondary | ICD-10-CM | POA: Diagnosis not present

## 2015-12-24 DIAGNOSIS — R609 Edema, unspecified: Secondary | ICD-10-CM

## 2015-12-24 MED ORDER — SPIRONOLACTONE 25 MG PO TABS: 25.0000 mg | ORAL_TABLET | Freq: Every day | ORAL | Status: DC

## 2015-12-24 MED ORDER — ATENOLOL 50 MG PO TABS: 50.0000 mg | ORAL_TABLET | Freq: Every day | ORAL | Status: DC

## 2015-12-24 NOTE — Progress Notes (Addendum)
Subjective: Chief Complaint  Patient presents with  . New Patient (Initial Visit)  . Joint Swelling    due to her HP and it has been in the high 180 and into 200 range for BP.    Here as a new patient today.  Here with son Linda Black who translate.    Here for high blood pressure, gets ankle and foot swelling, has kidney problems?   Sees [facility], hasn't been there in a while.   No hx/o diabetes per son and patient.  Swelling for years in legs.   Worse with elevated BPs.  Hypokalemia - noted on recent labs in April  Renal insufficiency - diagnosed about a year ago.  Hypertension - last medication use was a month ago.  Was seeing Dr. Jimmye Norman, but every time she needed refills, she was told to come back, but nothing was being changed.  Was diagnosed 8 years ago.   Never seen cardiologist, no prior ultrasound of heart.   Not much exercise.  Avoids salt and fried foods.  Works in [employer], standing 8-10 hours.  occasionally gets chest pain or feeling a little dyspneic, but no consistent and not with activity.  Nonsmoker.  Past Medical History  Diagnosis Date  . Hypertension   . Renal disorder   . Diabetes mellitus without complication (HCC)    ROS as in subjective  Objective BP 164/96 mmHg  Pulse 77  Wt 184 lb (83.462 kg)  LMP 12/05/2015  Gen: wd, wn, nad Skin unremarkable Heart RRR, normal s1, s2, no murmurs Lungs clear 2+ nonpitting LE edema throughout 1+ pulses Neck supple, nontender, no thyromegaly, no lymphadenopathy Abdomen: +bs, soft, nontender, no bruits, no mass    Assessment: Encounter Diagnoses  Name Primary?  . Essential hypertension Yes  . Edema, unspecified type     Plan: Begin medications below, avoid salt, needs to get walking for exercise, c/t compression hose OTC daily.  Reviewed recent labs from 11/2015 from ED visit, reviewed 11/2015 EKG, CXR.   F/u 36mo with recheck, labs, and discussed home monitoring of BP.     F/u 43mo. Chene was seen  today for new patient (initial visit) and joint swelling.  Diagnoses and all orders for this visit:  Essential hypertension  Edema, unspecified type  Other orders -     atenolol (TENORMIN) 50 MG tablet; Take 1 tablet (50 mg total) by mouth daily. -     spironolactone (ALDACTONE) 25 MG tablet; Take 1 tablet (25 mg total) by mouth daily.

## 2016-01-29 ENCOUNTER — Ambulatory Visit (INDEPENDENT_AMBULATORY_CARE_PROVIDER_SITE_OTHER): Payer: BLUE CROSS/BLUE SHIELD | Admitting: Medical

## 2016-01-29 ENCOUNTER — Encounter: Payer: Self-pay | Admitting: Medical

## 2016-01-29 VITALS — BP 150/98 | HR 60 | Wt 175.0 lb

## 2016-01-29 DIAGNOSIS — E876 Hypokalemia: Secondary | ICD-10-CM

## 2016-01-29 DIAGNOSIS — I10 Essential (primary) hypertension: Secondary | ICD-10-CM | POA: Insufficient documentation

## 2016-01-29 DIAGNOSIS — N289 Disorder of kidney and ureter, unspecified: Secondary | ICD-10-CM | POA: Insufficient documentation

## 2016-01-29 DIAGNOSIS — E669 Obesity, unspecified: Secondary | ICD-10-CM

## 2016-01-29 DIAGNOSIS — R3129 Other microscopic hematuria: Secondary | ICD-10-CM

## 2016-01-29 DIAGNOSIS — M545 Low back pain, unspecified: Secondary | ICD-10-CM

## 2016-01-29 DIAGNOSIS — G8929 Other chronic pain: Secondary | ICD-10-CM | POA: Insufficient documentation

## 2016-01-29 DIAGNOSIS — R809 Proteinuria, unspecified: Secondary | ICD-10-CM

## 2016-01-29 LAB — COMPREHENSIVE METABOLIC PANEL
ALBUMIN: 3.9 g/dL (ref 3.6–5.1)
ALT: 22 U/L (ref 6–29)
AST: 15 U/L (ref 10–30)
Alkaline Phosphatase: 56 U/L (ref 33–115)
BUN: 30 mg/dL — ABNORMAL HIGH (ref 7–25)
CALCIUM: 9.1 mg/dL (ref 8.6–10.2)
CHLORIDE: 104 mmol/L (ref 98–110)
CO2: 21 mmol/L (ref 20–31)
CREATININE: 1.51 mg/dL — AB (ref 0.50–1.10)
Glucose, Bld: 85 mg/dL (ref 65–99)
POTASSIUM: 4.7 mmol/L (ref 3.5–5.3)
SODIUM: 135 mmol/L (ref 135–146)
Total Bilirubin: 0.9 mg/dL (ref 0.2–1.2)
Total Protein: 6.9 g/dL (ref 6.1–8.1)

## 2016-01-29 LAB — HEMOGLOBIN A1C
Hgb A1c MFr Bld: 5.2 % (ref <5.7)
Mean Plasma Glucose: 103 mg/dL

## 2016-01-29 LAB — POCT URINALYSIS DIPSTICK
BILIRUBIN UA: NEGATIVE
GLUCOSE UA: NEGATIVE
KETONES UA: NEGATIVE
Leukocytes, UA: NEGATIVE
Nitrite, UA: NEGATIVE
PH UA: 7
Spec Grav, UA: 1.015
Urobilinogen, UA: NEGATIVE

## 2016-01-29 LAB — LIPID PANEL
CHOL/HDL RATIO: 4.2 ratio (ref <=5.0)
Cholesterol: 280 mg/dL — ABNORMAL HIGH (ref 125–200)
HDL: 66 mg/dL (ref >=46)
LDL CALC: 154 mg/dL — AB (ref <130)
TRIGLYCERIDES: 299 mg/dL — AB (ref <150)
VLDL: 60 mg/dL — AB (ref <30)

## 2016-01-29 MED ORDER — SPIRONOLACTONE 25 MG PO TABS: 25.0000 mg | ORAL_TABLET | Freq: Every day | ORAL | Status: DC

## 2016-01-29 MED ORDER — ATENOLOL 50 MG PO TABS: 50.0000 mg | ORAL_TABLET | Freq: Every day | ORAL | Status: DC

## 2016-01-29 MED ORDER — AMLODIPINE BESYLATE 5 MG PO TABS: 5.0000 mg | ORAL_TABLET | Freq: Every day | ORAL | Status: DC

## 2016-01-29 NOTE — Progress Notes (Signed)
Subjective: Chief Complaint  Patient presents with  . Follow-up    on BP. Has not checked bp since office visit   Here as a new patient today.  Here with daughter who translates.    Here for f/u on BP.   Is compliant with atenolol and spironolactone started last visit.  Edema has improved on these medication and in general is better in the mornings after rest.   She does some walking for exercise, out in the garden.    Does not add salt in diet.  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  Has had hypertension x 10 years per daughter.    She has some back pain x few days, some discomfort with urination.  Had UTI about 2 months ago, seen at the ED.  No hx/o recurrent UTI.  No blood or odor in urine, no vaginal discharge.    Sees [facility], hasn't been there in a while.   No hx/o diabetes.  Swelling for years in legs.   Hypokalemia - noted on recent labs in April.  Renal insufficiency - diagnosed about a year ago  Never seen cardiologist, no prior ultrasound of heart.   Not much exercise.  Avoids salt and fried foods.  Marland Kitchen  occasionally gets chest pain or feeling a little dyspneic, but no consistent and not with activity.  Nonsmoker.  Past Medical History  Diagnosis Date  . Hypertension   . Renal disorder   . Diabetes mellitus without complication (HCC)    ROS as in subjective  Objective BP 150/98 mmHg  Pulse 60  Wt 175 lb (79.379 kg)  LMP 01/20/2016  BP Readings from Last 3 Encounters:  01/29/16 150/98  12/24/15 164/96  11/18/15 113/92   Wt Readings from Last 3 Encounters:  01/29/16 175 lb (79.379 kg)  12/24/15 184 lb (83.462 kg)  09/23/15 186 lb 3.2 oz (84.46 kg)    Gen: wd, wn, nad Skin unremarkable Heart RRR, normal s1, s2, no murmurs Lungs clear 1+ nonpitting LE edema throughout 1+ pulses Neck supple, nontender, no thyromegaly, no lymphadenopathy Abdomen: +bs, soft, mild  lower abdominal tennderss generalized, otherwise nontender, no bruits, no mass Back:mild lumbar paraspinal tennderss, mild pain with flexion but ROM is full   Assessment: Encounter Diagnoses  Name Primary?  . Essential hypertension Yes  . Hypokalemia   . Obesity   . Renal insufficiency   . Bilateral low back pain without sciatica     Plan: Labs today, UA reviewed.  C/T spironolactone, atenolol, but add amlodipine.  Urine culture sent.  Advised she return in a few months for physical, breast /pelvic exam  F/u 50mo.  Linda Black was seen today for follow-up.  Diagnoses and all orders for this visit:  Essential hypertension -     Comprehensive metabolic panel -     Lipid panel -     Hemoglobin A1c -     Microalbumin / creatinine urine ratio  Hypokalemia -     Comprehensive metabolic panel -     Lipid panel -     Hemoglobin A1c -     Microalbumin / creatinine urine ratio  Obesity -     Comprehensive metabolic panel -     Lipid panel -     Hemoglobin A1c -     Microalbumin / creatinine urine ratio  Renal insufficiency  Bilateral low back pain without sciatica  Other orders -  amLODipine (NORVASC) 5 MG tablet; Take 1 tablet (5 mg total) by mouth daily. -     atenolol (TENORMIN) 50 MG tablet; Take 1 tablet (50 mg total) by mouth daily. -     spironolactone (ALDACTONE) 25 MG tablet; Take 1 tablet (25 mg total) by mouth daily.

## 2016-01-30 ENCOUNTER — Other Ambulatory Visit: Payer: Self-pay | Admitting: Medical

## 2016-01-30 LAB — MICROALBUMIN / CREATININE URINE RATIO
Creatinine, Urine: 50 mg/dL (ref 20–320)
MICROALB UR: 62.7 mg/dL — AB
MICROALB/CREAT RATIO: 1254 ug/mg{creat} — AB (ref <30)

## 2016-01-30 MED ORDER — PRAVASTATIN SODIUM 20 MG PO TABS: 20.0000 mg | ORAL_TABLET | Freq: Every day | ORAL | Status: DC

## 2016-01-31 LAB — URINE CULTURE

## 2016-03-26 ENCOUNTER — Other Ambulatory Visit: Payer: Self-pay | Admitting: Medical

## 2016-04-15 ENCOUNTER — Other Ambulatory Visit: Payer: Self-pay | Admitting: Medical

## 2016-04-21 ENCOUNTER — Encounter: Payer: Self-pay | Admitting: Medical

## 2016-04-21 ENCOUNTER — Ambulatory Visit (INDEPENDENT_AMBULATORY_CARE_PROVIDER_SITE_OTHER): Payer: BLUE CROSS/BLUE SHIELD | Admitting: Medical

## 2016-04-21 VITALS — BP 134/84 | HR 81 | Wt 176.4 lb

## 2016-04-21 DIAGNOSIS — N189 Chronic kidney disease, unspecified: Secondary | ICD-10-CM | POA: Diagnosis not present

## 2016-04-21 DIAGNOSIS — E669 Obesity, unspecified: Secondary | ICD-10-CM | POA: Diagnosis not present

## 2016-04-21 DIAGNOSIS — I10 Essential (primary) hypertension: Secondary | ICD-10-CM

## 2016-04-21 DIAGNOSIS — E785 Hyperlipidemia, unspecified: Secondary | ICD-10-CM | POA: Diagnosis not present

## 2016-04-21 DIAGNOSIS — E876 Hypokalemia: Secondary | ICD-10-CM

## 2016-04-21 DIAGNOSIS — N289 Disorder of kidney and ureter, unspecified: Secondary | ICD-10-CM | POA: Diagnosis not present

## 2016-04-21 LAB — POCT URINALYSIS DIPSTICK
Bilirubin, UA: NEGATIVE
Glucose, UA: NEGATIVE
KETONES UA: NEGATIVE
Leukocytes, UA: NEGATIVE
Nitrite, UA: NEGATIVE
SPEC GRAV UA: 1.015
UROBILINOGEN UA: NEGATIVE
pH, UA: 6

## 2016-04-21 NOTE — Progress Notes (Signed)
Subjective: Chief Complaint  Patient presents with  . follow-up    follow-up on meds   Here with daughter who translates.  Here for f/u on BP, lipids, CKD.  Last visit we added amlodipine to her regimen of atenolol and spironolactone.   Edema has improved on these medication and in general is better in the mornings after rest.   She does some walking for exercise, out in the garden.    Does not add salt in diet.  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  Has had hypertension x 10 years per daughter.    Sees [facility], hasn't been there in a while.   No hx/o diabetes.  Swelling for years in legs.   Hypokalemia - noted on recent labs in April.  Renal insufficiency - diagnosed about a year ago  Never seen cardiologist, no prior ultrasound of heart.  Avoids salt and fried foods.  Nonsmoker.  Past Medical History:  Diagnosis Date  . Diabetes mellitus without complication ([place])   . Hypertension   . Renal disorder    Current Outpatient Prescriptions on File Prior to Visit  Medication Sig Dispense Refill  . amLODipine (NORVASC) 5 MG tablet take 1 tablet by mouth once daily 30 tablet 2  . atenolol (TENORMIN) 50 MG tablet take 1 tablet by mouth once daily 30 tablet 2  . pravastatin (PRAVACHOL) 20 MG tablet take 1 tablet by mouth once daily 90 tablet 1  . spironolactone (ALDACTONE) 25 MG tablet take 1 tablet by mouth once daily 30 tablet 2   No current facility-administered medications on file prior to visit.      ROS as in subjective  Objective BP 134/84   Pulse 81   Wt 176 lb 6.4 oz (80 kg)   BMI 34.45 kg/m   BP Readings from Last 3 Encounters:  04/21/16 134/84  01/29/16 (!) 150/98  12/24/15 (!) 164/96   Wt Readings from Last 3 Encounters:  04/21/16 176 lb 6.4 oz (80 kg)  01/29/16 175 lb (79.4 kg)  12/24/15 184 lb (83.5 kg)    Gen: wd, wn, nad Skin unremarkable Heart RRR, normal s1,  s2, no murmurs Lungs clear Ext: 1+ nonpitting LE edema throughout 1+ pulses Neck supple, nontender, no thyromegaly, no lymphadenopathy Abdomen: +bs, soft, no abdominal tenderness, no bruits, no mass    Assessment: Encounter Diagnoses  Name Primary?  . Essential hypertension Yes  . Renal insufficiency   . Hypokalemia   . Obesity   . Hyperlipidemia   . CKD (chronic kidney disease), unspecified stage     Plan: C/t same medications, she is compliant with exercise, medications, and diet.   F/u pending labs  Aaliyah was seen today for follow-up.  Diagnoses and all orders for this visit:  Essential hypertension -     Renal Function Panel -     Hepatic function panel -     Lipid panel  Renal insufficiency -     Renal Function Panel  Hypokalemia  Obesity  Hyperlipidemia -     Lipid panel  CKD (chronic kidney disease), unspecified stage -     Renal Function Panel

## 2016-04-21 NOTE — Addendum Note (Signed)
Addended by: Minette Headland A on: 04/21/2016 01:16 PM   Modules accepted: Orders

## 2016-04-22 ENCOUNTER — Other Ambulatory Visit: Payer: Self-pay | Admitting: Medical

## 2016-04-22 LAB — RENAL FUNCTION PANEL
Albumin: 4.3 g/dL (ref 3.6–5.1)
BUN: 26 mg/dL — ABNORMAL HIGH (ref 7–25)
CHLORIDE: 104 mmol/L (ref 98–110)
CO2: 23 mmol/L (ref 20–31)
CREATININE: 1.62 mg/dL — AB (ref 0.50–1.10)
Calcium: 9.2 mg/dL (ref 8.6–10.2)
Glucose, Bld: 90 mg/dL (ref 65–99)
POTASSIUM: 3.6 mmol/L (ref 3.5–5.3)
Phosphorus: 3.3 mg/dL (ref 2.5–4.5)
SODIUM: 138 mmol/L (ref 135–146)

## 2016-04-22 LAB — HEPATIC FUNCTION PANEL
ALK PHOS: 53 U/L (ref 33–115)
ALT: 17 U/L (ref 6–29)
AST: 15 U/L (ref 10–30)
Albumin: 4.3 g/dL (ref 3.6–5.1)
BILIRUBIN DIRECT: 0.1 mg/dL (ref <=0.2)
BILIRUBIN INDIRECT: 0.7 mg/dL (ref 0.2–1.2)
BILIRUBIN TOTAL: 0.8 mg/dL (ref 0.2–1.2)
Total Protein: 7.5 g/dL (ref 6.1–8.1)

## 2016-04-22 LAB — LIPID PANEL
CHOL/HDL RATIO: 4.2 ratio (ref <=5.0)
CHOLESTEROL: 253 mg/dL — AB (ref 125–200)
HDL: 60 mg/dL (ref >=46)
LDL CALC: 144 mg/dL — AB (ref <130)
Triglycerides: 244 mg/dL — ABNORMAL HIGH (ref <150)
VLDL: 49 mg/dL — ABNORMAL HIGH (ref <30)

## 2016-04-22 MED ORDER — ATENOLOL 50 MG PO TABS
50.0000 mg | ORAL_TABLET | Freq: Every day | ORAL | 1 refills | Status: DC
Start: 2016-04-22 — End: 2017-02-24

## 2016-04-22 MED ORDER — PRAVASTATIN SODIUM 20 MG PO TABS: 20.0000 mg | ORAL_TABLET | Freq: Every day | ORAL | 1 refills | Status: DC

## 2016-04-22 MED ORDER — SPIRONOLACTONE 25 MG PO TABS: 25.0000 mg | ORAL_TABLET | Freq: Every day | ORAL | 1 refills | Status: DC

## 2016-04-22 MED ORDER — AMLODIPINE BESYLATE 5 MG PO TABS: 5.0000 mg | ORAL_TABLET | Freq: Every day | ORAL | 1 refills | Status: DC

## 2016-04-23 ENCOUNTER — Other Ambulatory Visit: Payer: Self-pay | Admitting: Medical

## 2016-04-23 MED ORDER — ROSUVASTATIN CALCIUM 20 MG PO TABS: 20.0000 mg | ORAL_TABLET | Freq: Every day | ORAL | 1 refills | Status: DC

## 2016-04-24 ENCOUNTER — Telehealth: Payer: Self-pay

## 2016-04-24 NOTE — Telephone Encounter (Signed)
Note and labs rcvd on this pt from [facility] as requested in lab work yesterday. Placed on your desk for review. Linda Black

## 2016-08-04 DIAGNOSIS — N02B9 Other recurrent and persistent immunoglobulin A nephropathy: Secondary | ICD-10-CM

## 2016-08-04 DIAGNOSIS — N028 Recurrent and persistent hematuria with other morphologic changes: Secondary | ICD-10-CM

## 2016-08-04 HISTORY — DX: Other recurrent and persistent immunoglobulin A nephropathy: N02.B9

## 2016-08-04 HISTORY — PX: RENAL BIOPSY: SHX156

## 2016-08-04 HISTORY — DX: Recurrent and persistent hematuria with other morphologic changes: N02.8

## 2016-10-30 ENCOUNTER — Telehealth: Payer: Self-pay

## 2016-10-30 NOTE — Telephone Encounter (Signed)
Records faxed to [facility] at [phone]./GNP

## 2017-01-07 DIAGNOSIS — Z0279 Encounter for issue of other medical certificate: Secondary | ICD-10-CM

## 2017-01-15 ENCOUNTER — Telehealth: Payer: Self-pay

## 2017-01-15 NOTE — Telephone Encounter (Signed)
Records faxed to [facility] at [phone] for DOS 04/21/2016.

## 2017-02-24 ENCOUNTER — Encounter: Payer: Self-pay | Admitting: Medical

## 2017-02-24 ENCOUNTER — Ambulatory Visit (INDEPENDENT_AMBULATORY_CARE_PROVIDER_SITE_OTHER): Payer: Medicaid Other | Admitting: Medical

## 2017-02-24 VITALS — BP 132/84 | HR 76 | Wt 192.0 lb

## 2017-02-24 DIAGNOSIS — E876 Hypokalemia: Secondary | ICD-10-CM

## 2017-02-24 DIAGNOSIS — N289 Disorder of kidney and ureter, unspecified: Secondary | ICD-10-CM

## 2017-02-24 DIAGNOSIS — R0789 Other chest pain: Secondary | ICD-10-CM | POA: Diagnosis not present

## 2017-02-24 DIAGNOSIS — I10 Essential (primary) hypertension: Secondary | ICD-10-CM | POA: Diagnosis not present

## 2017-02-24 DIAGNOSIS — E785 Hyperlipidemia, unspecified: Secondary | ICD-10-CM

## 2017-02-24 DIAGNOSIS — R0602 Shortness of breath: Secondary | ICD-10-CM | POA: Diagnosis not present

## 2017-02-24 LAB — CBC WITH DIFFERENTIAL/PLATELET
BASOS ABS: 94 {cells}/uL (ref 0–200)
Basophils Relative: 1 %
EOS PCT: 4 %
Eosinophils Absolute: 376 cells/uL (ref 15–500)
HCT: 42.7 % (ref 35.0–45.0)
Hemoglobin: 14.5 g/dL (ref 11.7–15.5)
Lymphocytes Relative: 24 %
Lymphs Abs: 2256 cells/uL (ref 850–3900)
MCH: 30.8 pg (ref 27.0–33.0)
MCHC: 34 g/dL (ref 32.0–36.0)
MCV: 90.7 fL (ref 80.0–100.0)
MONOS PCT: 7 %
MPV: 9 fL (ref 7.5–12.5)
Monocytes Absolute: 658 cells/uL (ref 200–950)
NEUTROS ABS: 6016 {cells}/uL (ref 1500–7800)
Neutrophils Relative %: 64 %
PLATELETS: 305 10*3/uL (ref 140–400)
RBC: 4.71 MIL/uL (ref 3.80–5.10)
RDW: 12.5 % (ref 11.0–15.0)
WBC: 9.4 10*3/uL (ref 4.0–10.5)

## 2017-02-24 LAB — HEPATIC FUNCTION PANEL
ALBUMIN: 4.1 g/dL (ref 3.6–5.1)
ALK PHOS: 59 U/L (ref 33–115)
ALT: 37 U/L — ABNORMAL HIGH (ref 6–29)
AST: 26 U/L (ref 10–35)
Bilirubin, Direct: 0.1 mg/dL (ref <=0.2)
Indirect Bilirubin: 0.7 mg/dL (ref 0.2–1.2)
TOTAL PROTEIN: 7.3 g/dL (ref 6.1–8.1)
Total Bilirubin: 0.8 mg/dL (ref 0.2–1.2)

## 2017-02-24 LAB — TSH: TSH: 6.1 m[IU]/L — AB

## 2017-02-24 LAB — RENAL FUNCTION PANEL
Albumin: 4.1 g/dL (ref 3.6–5.1)
BUN: 27 mg/dL — AB (ref 7–25)
CO2: 20 mmol/L (ref 20–31)
CREATININE: 1.88 mg/dL — AB (ref 0.50–1.10)
Calcium: 9.3 mg/dL (ref 8.6–10.2)
Chloride: 101 mmol/L (ref 98–110)
Glucose, Bld: 91 mg/dL (ref 65–99)
Phosphorus: 3.9 mg/dL (ref 2.5–4.5)
Potassium: 4.1 mmol/L (ref 3.5–5.3)
Sodium: 136 mmol/L (ref 135–146)

## 2017-02-24 LAB — T4, FREE: Free T4: 1.2 ng/dL (ref 0.8–1.8)

## 2017-02-24 MED ORDER — ATENOLOL 50 MG PO TABS: 50.0000 mg | ORAL_TABLET | Freq: Every day | ORAL | 1 refills | Status: DC

## 2017-02-24 MED ORDER — ROSUVASTATIN CALCIUM 20 MG PO TABS: 20.0000 mg | ORAL_TABLET | Freq: Every day | ORAL | 1 refills | Status: DC

## 2017-02-24 MED ORDER — SPIRONOLACTONE 25 MG PO TABS: 25.0000 mg | ORAL_TABLET | Freq: Every day | ORAL | 1 refills | Status: DC

## 2017-02-24 MED ORDER — AMLODIPINE BESYLATE 5 MG PO TABS
5.0000 mg | ORAL_TABLET | Freq: Every day | ORAL | 1 refills | Status: DC
Start: 2017-02-24 — End: 2018-01-06

## 2017-02-24 NOTE — Patient Instructions (Signed)
Begin back on Spironolactone 25mg  daily in the morning (fluid pill) Begin back on Atenolol 50mg  daily for blood pressure in the morning After 1 week, begin back on Amlodipine 5mg  daily in the morning for blood pressure After 3 weeks begin back on Crestor for cholesterol Reschedule follow up visit with [facility] doctor  If any medication too expensive, then let me know  Lets recheck in 1-2 weeks  [place], [place], [address] [address] [phone]

## 2017-02-24 NOTE — Progress Notes (Signed)
Subjective: Chief Complaint  Patient presents with  . feeling  hot all over    feeling hot all over , sob   Here with daughter for med check.  She hasn't been back for routine f/u in months due to job and insurance changes.  She now has Medicaid.    She ran out of medications 6 months ago due to insurance changes.  Lately having SOB, face itching and hot, finger and hands feel hot, feet hot.  Burning in chest.  Periods a little heavier than usual.   No paresthesias, no fever.  Has gained some weight. No specific joints swollen.     Hasn't been back to nephrology in a while either.  No other aggravating or relieving factors. No other complaint.  Past Medical History:  Diagnosis Date  . Diabetes mellitus without complication ([place])   . Hypertension   . Renal disorder    No current outpatient prescriptions on file prior to visit.   No current facility-administered medications on file prior to visit.    ROS as in subjective   Objective: BP 132/84   Pulse 76   Wt 192 lb (87.1 kg)   SpO2 98%   BMI 37.50 kg/m   General appearance: alert, no distress, WD/WN Skin: flushing of cheeks throughout, telangiectasias of face, suggestive of rosacea Neck: supple, no lymphadenopathy, no thyromegaly, no masses, no JVD, no bruits Heart: RRR, normal S1, S2, no murmurs Lungs: CTA bilaterally, no wheezes, rhonchi, or rales Abdomen: +bs, soft, non tender, non distended, no masses, no hepatomegaly, no splenomegaly Pulses: 1+ symmetric, upper and lower extremities, normal cap refill Ext: legs with 1+ nonpitting edema   Adult ECG Report  Indication: HTN, SOB  Rate: 73 bpm  Rhythm: normal sinus rhythm  QRS Axis: 67 degrees  PR Interval: 118ms  QRS Duration: 60ms  QTc: 447 ms  Conduction Disturbances: none  Other Abnormalities: none  Patient's cardiac risk factors are: hypertension and obesity (BMI >= 30 kg/m2).  EKG comparison: none  Narrative Interpretation: normal  EKG     Assessment: Encounter Diagnoses  Name Primary?  . Essential hypertension Yes  . Renal insufficiency   . Hyperlipidemia, unspecified hyperlipidemia type   . Hypokalemia   . SOB (shortness of breath)   . Chest discomfort   . Morbid obesity ([place])     Plan: Discussed her symptoms, concerns.   Labs today, discussed importance of not running out of medications, discussed need for regular f/u here and with nephrology.   Restart medications as below.  Begin back on Spironolactone 25mg  daily in the morning (fluid pill) Begin back on Atenolol 50mg  daily for blood pressure in the morning After 1 week, begin back on Amlodipine 5mg  daily in the morning for blood pressure After 3 weeks begin back on Crestor for cholesterol Reschedule follow up visit with [facility] was seen today for feeling  hot all over.  Diagnoses and all orders for this visit:  Essential hypertension -     Hepatic function panel -     CBC with Differential/Platelet -     TSH -     T4, free -     Hemoglobin A1c -     EKG 12-Lead -     Renal Function Panel  Renal insufficiency -     Hepatic function panel -     CBC with Differential/Platelet -     TSH -     T4, free -  Hemoglobin A1c -     EKG 12-Lead -     Renal Function Panel  Hyperlipidemia, unspecified hyperlipidemia type -     Hepatic function panel -     CBC with Differential/Platelet -     TSH -     T4, free -     Hemoglobin A1c -     EKG 12-Lead -     Renal Function Panel  Hypokalemia -     Hepatic function panel -     CBC with Differential/Platelet -     TSH -     T4, free -     Hemoglobin A1c -     EKG 12-Lead -     Renal Function Panel  SOB (shortness of breath) -     Hepatic function panel -     CBC with Differential/Platelet -     TSH -     T4, free -     Hemoglobin A1c -     EKG 12-Lead -     Renal Function Panel  Chest discomfort -     Hepatic function panel -     CBC with  Differential/Platelet -     TSH -     T4, free -     Hemoglobin A1c -     EKG 12-Lead -     Renal Function Panel  Morbid obesity (HCC) -     Hepatic function panel -     CBC with Differential/Platelet -     TSH -     T4, free -     Hemoglobin A1c -     EKG 12-Lead -     Renal Function Panel  Other orders -     amLODipine (NORVASC) 5 MG tablet; Take 1 tablet (5 mg total) by mouth daily. -     atenolol (TENORMIN) 50 MG tablet; Take 1 tablet (50 mg total) by mouth daily. -     rosuvastatin (CRESTOR) 20 MG tablet; Take 1 tablet (20 mg total) by mouth daily. -     spironolactone (ALDACTONE) 25 MG tablet; Take 1 tablet (25 mg total) by mouth daily.

## 2017-02-25 ENCOUNTER — Other Ambulatory Visit: Payer: Self-pay | Admitting: Medical

## 2017-02-25 LAB — HEMOGLOBIN A1C
Hgb A1c MFr Bld: 5.4 % (ref <5.7)
Mean Plasma Glucose: 108 mg/dL

## 2017-03-19 ENCOUNTER — Other Ambulatory Visit (HOSPITAL_COMMUNITY): Payer: Self-pay | Admitting: Nephrology

## 2017-03-19 DIAGNOSIS — R809 Proteinuria, unspecified: Secondary | ICD-10-CM

## 2017-03-26 ENCOUNTER — Other Ambulatory Visit: Payer: Self-pay | Admitting: Radiology

## 2017-03-27 ENCOUNTER — Ambulatory Visit (HOSPITAL_COMMUNITY)
Admission: RE | Admit: 2017-03-27 | Discharge: 2017-03-27 | Disposition: A | Discharge status: Home / Self Care | Payer: Medicaid Other | Source: Ambulatory Visit | Attending: Nephrology | Admitting: Nephrology

## 2017-03-27 ENCOUNTER — Encounter (HOSPITAL_COMMUNITY): Payer: Self-pay

## 2017-03-27 DIAGNOSIS — I129 Hypertensive chronic kidney disease with stage 1 through stage 4 chronic kidney disease, or unspecified chronic kidney disease: Secondary | ICD-10-CM | POA: Insufficient documentation

## 2017-03-27 DIAGNOSIS — Z8249 Family history of ischemic heart disease and other diseases of the circulatory system: Secondary | ICD-10-CM | POA: Insufficient documentation

## 2017-03-27 DIAGNOSIS — Z833 Family history of diabetes mellitus: Secondary | ICD-10-CM | POA: Diagnosis not present

## 2017-03-27 DIAGNOSIS — R809 Proteinuria, unspecified: Secondary | ICD-10-CM | POA: Diagnosis present

## 2017-03-27 DIAGNOSIS — N189 Chronic kidney disease, unspecified: Secondary | ICD-10-CM | POA: Diagnosis not present

## 2017-03-27 DIAGNOSIS — Z79899 Other long term (current) drug therapy: Secondary | ICD-10-CM | POA: Insufficient documentation

## 2017-03-27 DIAGNOSIS — I10 Essential (primary) hypertension: Secondary | ICD-10-CM | POA: Diagnosis not present

## 2017-03-27 DIAGNOSIS — E119 Type 2 diabetes mellitus without complications: Secondary | ICD-10-CM | POA: Insufficient documentation

## 2017-03-27 DIAGNOSIS — R3129 Other microscopic hematuria: Secondary | ICD-10-CM | POA: Insufficient documentation

## 2017-03-27 LAB — APTT: aPTT: 31 seconds (ref 24–36)

## 2017-03-27 LAB — CBC
HEMATOCRIT: 39.8 % (ref 36.0–46.0)
Hemoglobin: 13.2 g/dL (ref 12.0–15.0)
MCH: 29 pg (ref 26.0–34.0)
MCHC: 33.2 g/dL (ref 30.0–36.0)
MCV: 87.5 fL (ref 78.0–100.0)
PLATELETS: 297 10*3/uL (ref 150–400)
RBC: 4.55 MIL/uL (ref 3.87–5.11)
RDW: 12.1 % (ref 11.5–15.5)
WBC: 10.2 10*3/uL (ref 4.0–10.5)

## 2017-03-27 LAB — PROTIME-INR
INR: 0.96
Prothrombin Time: 12.8 seconds (ref 11.4–15.2)

## 2017-03-27 LAB — GLUCOSE, CAPILLARY: GLUCOSE-CAPILLARY: 91 mg/dL (ref 65–99)

## 2017-03-27 LAB — PREGNANCY, URINE: PREG TEST UR: NEGATIVE

## 2017-03-27 MED ORDER — MIDAZOLAM HCL 2 MG/2ML IJ SOLN
INTRAMUSCULAR | Status: AC | PRN
  Administered 2017-03-27 (×2): 1 mg via INTRAVENOUS

## 2017-03-27 MED ORDER — FENTANYL CITRATE (PF) 100 MCG/2ML IJ SOLN
INTRAMUSCULAR | Status: AC | PRN
  Administered 2017-03-27 (×2): 50 ug via INTRAVENOUS

## 2017-03-27 MED ORDER — HYDROCODONE-ACETAMINOPHEN 5-325 MG PO TABS: 1.0000 | ORAL_TABLET | ORAL | Status: DC | PRN

## 2017-03-27 MED ORDER — SODIUM CHLORIDE 0.9 % IV SOLN: INTRAVENOUS | Status: DC

## 2017-03-27 MED ORDER — GELATIN ABSORBABLE 12-7 MM EX MISC
CUTANEOUS | Status: AC
  Filled 2017-03-27: qty 1

## 2017-03-27 MED ORDER — FENTANYL CITRATE (PF) 100 MCG/2ML IJ SOLN
INTRAMUSCULAR | Status: AC
  Filled 2017-03-27: qty 2

## 2017-03-27 MED ORDER — LIDOCAINE HCL (PF) 1 % IJ SOLN
INTRAMUSCULAR | Status: AC
  Filled 2017-03-27: qty 30

## 2017-03-27 MED ORDER — MIDAZOLAM HCL 2 MG/2ML IJ SOLN
INTRAMUSCULAR | Status: AC
  Filled 2017-03-27: qty 2

## 2017-03-27 NOTE — Sedation Documentation (Signed)
Patient is resting comfortably. 

## 2017-03-27 NOTE — Discharge Instructions (Addendum)
Percutaneous Kidney Biopsy, Care After This sheet gives you information about how to care for yourself after your procedure. Your health care provider may also give you more specific instructions. If you have problems or questions, contact your health care provider. What can I expect after the procedure? After the procedure, it is common to have: Pain or soreness near the area where the needle went through your skin (biopsy site). Bright pink or cloudy urine for 24 hours after the procedure.  Follow these instructions at home: Activity Return to your normal activities as told by your health care provider. Ask your health care provider what activities are safe for you. Do not drive for 24 hours if you were given a medicine to help you relax (sedative). Do not lift anything that is heavier than 10 lb (4.5 kg) until your health care provider tells you that it is safe. Avoid activities that take a lot of effort (are strenuous) until your health care provider approves. Most people will have to wait 2 weeks before returning to activities such as exercise or sexual intercourse. General instructions Take over-the-counter and prescription medicines only as told by your health care provider. You may eat and drink after your procedure. Follow instructions from your health care provider about eating or drinking restrictions. Check your biopsy site every day for signs of infection. Check for: More redness, swelling, or pain. More fluid or blood. Warmth. Pus or a bad smell. Keep all follow-up visits as told by your health care provider. This is important. Contact a health care provider if: You have more redness, swelling, or pain around your biopsy site. You have more fluid or blood coming from your biopsy site. Your biopsy site feels warm to the touch. You have pus or a bad smell coming from your biopsy site. You have blood in your urine more than 24 hours after your procedure. Get help right away  if: You have dark red or brown urine. You have a fever. You are unable to urinate. You feel burning when you urinate. You feel faint. You have severe pain in your abdomen or side. This information is not intended to replace advice given to you by your health care provider. Make sure you discuss any questions you have with your health care provider. Document Released: 03/23/2013 Document Revised: 05/02/2016 Document Reviewed: 05/02/2016 Elsevier Interactive Patient Education  2018 [place]. Percutaneous Kidney Biopsy A kidney biopsy is a procedure to remove small pieces of tissue from a kidney. In a percutaneous biopsy, the tissue is removed using a needle that is inserted through the skin. This procedure is done so that the tissue can be examined under a microscope and checked for disease or infection. Tell a health care provider about:  Any allergies you have.  All medicines you are taking, including vitamins, herbs, eye drops, creams, and over-the-counter medicines.  Any problems you or family members have had with anesthetic medicines.  Any blood disorders you have.  Any surgeries you have had.  Any medical conditions you have.  Whether you are pregnant or may be pregnant. What are the risks? Generally, this is a safe procedure. However, problems may occur, including:  Infection.  Bleeding.  Allergic reactions to medicines.  Damage to other structures or organs.  Swelling from a collection of clotted blood outside a blood vessel (hematoma).  Blood in the urine (hematuria).  What happens before the procedure?  Follow instructions from your health care provider about eating or drinking restrictions.  Ask  your health care provider about: ? Changing or stopping your regular medicines. This is especially important if you are taking diabetes medicines or blood thinners. ? Taking medicines such as aspirin and ibuprofen. These medicines can thin your blood. Do not take  these medicines before your procedure if your health care provider instructs you not to.  You may be given antibiotic medicine to help prevent infection.  You will have blood and urine samples taken. This is to make sure that you do not have a condition where you should not have a biopsy.  Plan to have someone take you home from the hospital or clinic.  Ask your health care provider how your biopsy site will be marked or identified. What happens during the procedure?  To lower your risk of infection: ? Your health care team will wash or sanitize their hands. ? Your skin will be washed with soap.  An IV tube will be inserted into one of your veins.  You will be given one or more of the following: ? A medicine to help you relax (sedative). ? A medicine to numb the area (local anesthetic).  You will lie on your abdomen. A firm pillow will be placed under your body to help push the kidneys closer to the surface of the skin. If you have a transplanted kidney, you will lie on your back.  The health care provider will mark the area where the needle will enter your skin.  An imaging test--such as an ultrasound, X-ray, CT scan, or MRI--will be used to locate the kidney. These images will also help the health care provider to guide the biopsy needle into the kidney.  You will be asked to hold your breath and stay still while the health care provider inserts the needle and removes the kidney tissue. ? You will need to hold your breath and stay still for 30-45 seconds. ? During the biopsy, you may hear a popping sound from the needle. ? You may also feel some pressure from the area where the needle is being inserted.  The needle may be inserted and removed 3 or 4 times to make sure that enough tissue is taken for testing.  A bandage (dressing) may be placed over the spot where the needle entered your skin (biopsy site). The procedure may vary among health care providers and hospitals. What  happens after the procedure?  Your blood pressure, heart rate, breathing rate, and blood oxygen level will be monitored until the medicines you were given have worn off.  You will need to lie on your back for 6-8 hours.  You may have some pain or soreness near the biopsy site.  You may have pink or cloudy urine from small amounts of blood. This is normal.  You may have grogginess or fatigue if you were given a sedative.  Do not drive for 24 hours if you were given a sedative.  It is up to you to get the results of your procedure. Ask your health care provider, or the department performing the procedure, when your results will be ready. This information is not intended to replace advice given to you by your health care provider. Make sure you discuss any questions you have with your health care provider. Document Released: 05/31/2004 Document Revised: 05/02/2016 Document Reviewed: 05/02/2016 Elsevier Interactive Patient Education  2018 [place]. Moderate Conscious Sedation, Adult, Care After These instructions provide you with information about caring for yourself after your procedure. Your health care provider may  also give you more specific instructions. Your treatment has been planned according to current medical practices, but problems sometimes occur. Call your health care provider if you have any problems or questions after your procedure. What can I expect after the procedure? After your procedure, it is common:  To feel sleepy for several hours.  To feel clumsy and have poor balance for several hours.  To have poor judgment for several hours.  To vomit if you eat too soon.  Follow these instructions at home: For at least 24 hours after the procedure:   Do not: ? Participate in activities where you could fall or become injured. ? Drive. ? Use heavy machinery. ? Drink alcohol. ? Take sleeping pills or medicines that cause drowsiness. ? Make important decisions or  sign legal documents. ? Take care of children on your own.  Rest. Eating and drinking  Follow the diet recommended by your health care provider.  If you vomit: ? Drink water, juice, or soup when you can drink without vomiting. ? Make sure you have little or no nausea before eating solid foods. General instructions  Have a responsible adult stay with you until you are awake and alert.  Take over-the-counter and prescription medicines only as told by your health care provider.  If you smoke, do not smoke without supervision.  Keep all follow-up visits as told by your health care provider. This is important. Contact a health care provider if:  You keep feeling nauseous or you keep vomiting.  You feel light-headed.  You develop a rash.  You have a fever. Get help right away if:  You have trouble breathing. This information is not intended to replace advice given to you by your health care provider. Make sure you discuss any questions you have with your health care provider. Document Released: 05/11/2013 Document Revised: 12/24/2015 Document Reviewed: 11/10/2015 Elsevier Interactive Patient Education  [employer].

## 2017-03-27 NOTE — Procedures (Signed)
US guided core biopsies of right kidney lower pole.  2 suitable 16 gauge cores obtained.  No immediate complication.  Minimal blood loss.  Plan for bedrest today.

## 2017-03-27 NOTE — Progress Notes (Signed)
Referring Physician(s): Corliss Parish  Supervising Physician: Markus Daft  Patient Status:  [facility] OP  Chief Complaint:  Proteinuria; hematuria Rising creatinine Random renal biopsy performed in Rad today  Subjective:  Doing well Has eaten Denies N/V Denies pain Denies hematuria    Allergies: Patient has no known allergies.  Medications: Prior to Admission medications   Medication Sig Start Date End Date Taking? Authorizing Provider  amLODipine (NORVASC) 5 MG tablet Take 1 tablet (5 mg total) by mouth daily. 02/24/17  Yes Tysinger, Camelia Eng, PA-C  atenolol (TENORMIN) 50 MG tablet Take 1 tablet (50 mg total) by mouth daily. 02/24/17  Yes Tysinger, Camelia Eng, PA-C  rosuvastatin (CRESTOR) 20 MG tablet Take 1 tablet (20 mg total) by mouth daily. 02/24/17  Yes Tysinger, Camelia Eng, PA-C  spironolactone (ALDACTONE) 25 MG tablet Take 1 tablet (25 mg total) by mouth daily. 02/24/17  Yes Tysinger, Camelia Eng, PA-C     Vital Signs: BP 130/82   Pulse (!) 50   Temp 97.8 F (36.6 C) (Oral)   Resp 14   Ht 5' (1.524 m)   Wt 190 lb (86.2 kg)   LMP 03/23/2017   SpO2 97%   BMI 37.11 kg/m   Physical Exam  Constitutional: She is oriented to person, place, and time.  Pulmonary/Chest: Effort normal and breath sounds normal.  Abdominal: Soft. Bowel sounds are normal. There is no tenderness.  Musculoskeletal: Normal range of motion.  Neurological: She is alert and oriented to person, place, and time.  Skin: Skin is warm and dry.  Site is clean and dry NT no bleeding No hematoma    Imaging: US Biopsy  Result Date: 03/27/2017 INDICATION: 46 year old female with proteinuria, unspecified type. EXAM: ULTRASOUND-GUIDED BIOPSY OF RIGHT KIDNEY MEDICATIONS: None. ANESTHESIA/SEDATION: Moderate (conscious) sedation was employed during this procedure. A total of Versed 2.0 mg and Fentanyl 100 mcg was administered intravenously. Moderate Sedation Time: 20 minutes. The patient's level of  consciousness and vital signs were monitored continuously by radiology nursing throughout the procedure under my direct supervision. FLUOROSCOPY TIME:  None COMPLICATIONS: None immediate. PROCEDURE: Informed written consent was obtained from the patient with a translator after a thorough discussion of the procedural risks, benefits and alternatives. All questions were addressed. A timeout was performed prior to the initiation of the procedure. Patient was placed prone. Both kidneys were evaluated with ultrasound. Both kidneys are echogenic and difficult to visualize. The right kidney was selected for biopsy. The right flank was prepped with chlorhexidine and sterile field was created. Skin and soft tissues were anesthetized with 1% lidocaine. Using ultrasound guidance, 16 gauge core biopsy was obtained from the right kidney lower pole cortex. Specimens placed in saline. A second core biopsy was obtained using ultrasound guidance. Both specimens were felt to be adequate. Bandage placed over the puncture site. FINDINGS: Both kidneys are echogenic. No hydronephrosis. Core biopsies obtained from the right kidney lower pole. No significant bleeding or hematoma formation following the core biopsies. IMPRESSION: Successful ultrasound-guided core biopsies of the right kidney lower pole. Electronically Signed   By: Markus Daft M.D.   On: 03/27/2017 11:23    Labs:  CBC:  Recent Labs  02/24/17 1255 03/27/17 0644  WBC 9.4 10.2  HGB 14.5 13.2  HCT 42.7 39.8  PLT 305 297    COAGS:  Recent Labs  03/27/17 0644  INR 0.96  APTT 31    BMP:  Recent Labs  04/21/16 1100 02/24/17 1255  NA 138 136  K  3.6 4.1  CL 104 101  CO2 23 20  GLUCOSE 90 91  BUN 26* 27*  CALCIUM 9.2 9.3  CREATININE 1.62* 1.88*    LIVER FUNCTION TESTS:  Recent Labs  04/21/16 1100 02/24/17 1255  BILITOT 0.8 0.8  AST 15 26  ALT 17 37*  ALKPHOS 53 59  PROT 7.5 7.3  ALBUMIN 4.3  4.3 4.1  4.1    Assessment and  Plan:  Random renal bx in IR today Doing well No complication  Electronically Signed: Izola Teague A, PA-C 03/27/2017, 12:30 PM   I spent a total of 15 Minutes at the the patient's bedside AND on the patient's hospital floor or unit, greater than 50% of which was counseling/coordinating care for random renal bx

## 2017-03-27 NOTE — Sedation Documentation (Signed)
Patient denies pain and is resting comfortably.  

## 2017-03-27 NOTE — H&P (Signed)
Chief Complaint: Patient was seen in consultation today for random renal biopsy at the request of [place]  Referring Physician(s): [place]  Supervising Physician: Markus Daft  Patient Status: [facility] - Out-pt  History of Present Illness: Linda Black is a 46 y.o. female   Proteinuria Microscopic hematuria Increasing creatinine Request for random renal biopsy per Dr Moshe Cipro  Past Medical History:  Diagnosis Date  . Diabetes mellitus without complication ([place])   . Hypertension   . Renal disorder     History reviewed. No pertinent surgical history.  Allergies: Patient has no known allergies.  Medications: Prior to Admission medications   Medication Sig Start Date End Date Taking? Authorizing Provider  amLODipine (NORVASC) 5 MG tablet Take 1 tablet (5 mg total) by mouth daily. 02/24/17  Yes Tysinger, Camelia Eng, PA-C  atenolol (TENORMIN) 50 MG tablet Take 1 tablet (50 mg total) by mouth daily. 02/24/17  Yes Tysinger, Camelia Eng, PA-C  rosuvastatin (CRESTOR) 20 MG tablet Take 1 tablet (20 mg total) by mouth daily. 02/24/17  Yes Tysinger, Camelia Eng, PA-C  spironolactone (ALDACTONE) 25 MG tablet Take 1 tablet (25 mg total) by mouth daily. 02/24/17  Yes Tysinger, Camelia Eng, PA-C     Family History  Problem Relation Age of Onset  . Diabetes Mother   . Hypertension Father     Social History   Social History  . Marital status: Married    Spouse name: N/A  . Number of children: N/A  . Years of education: N/A   Social History Main Topics  . Smoking status: Never Smoker  . Smokeless tobacco: Never Used  . Alcohol use No  . Drug use: No  . Sexual activity: Not Asked   Other Topics Concern  . None   Social History Narrative  . None    Review of Systems: A 12 point ROS discussed and pertinent positives are indicated in the HPI above.  All other systems are negative.  Review of Systems  Constitutional: Negative for activity change, fatigue and  fever.  Respiratory: Negative for shortness of breath.   Cardiovascular: Negative for chest pain.  Gastrointestinal: Negative for abdominal pain.  Neurological: Negative for weakness.  Psychiatric/Behavioral: Negative for behavioral problems and confusion.    Vital Signs: BP (!) 148/87   Pulse 60   Temp 98.3 F (36.8 C) (Oral)   Resp 16   Ht 5' (1.524 m)   Wt 190 lb (86.2 kg)   LMP 03/23/2017   SpO2 99%   BMI 37.11 kg/m   Physical Exam  Constitutional: She is oriented to person, place, and time. She appears well-nourished.  Cardiovascular: Normal rate and regular rhythm.   Pulmonary/Chest: Effort normal and breath sounds normal.  Abdominal: Soft. Bowel sounds are normal. There is no tenderness.  Musculoskeletal: Normal range of motion.  Neurological: She is alert and oriented to person, place, and time.  Skin: Skin is warm and dry.  Psychiatric: She has a normal mood and affect. Her behavior is normal. Judgment and thought content normal.  Consented trhough interpreter at bedside  Nursing note and vitals reviewed.   Mallampati Score:  MD Evaluation Airway: WNL Heart: WNL Abdomen: WNL Chest/ Lungs: WNL ASA  Classification: 2 Mallampati/Airway Score: Two  Imaging: No results found.  Labs:  CBC:  Recent Labs  02/24/17 1255 03/27/17 0644  WBC 9.4 10.2  HGB 14.5 13.2  HCT 42.7 39.8  PLT 305 297    COAGS:  Recent Labs  03/27/17 0644  INR 0.96  APTT 31    BMP:  Recent Labs  04/21/16 1100 02/24/17 1255  NA 138 136  K 3.6 4.1  CL 104 101  CO2 23 20  GLUCOSE 90 91  BUN 26* 27*  CALCIUM 9.2 9.3  CREATININE 1.62* 1.88*    LIVER FUNCTION TESTS:  Recent Labs  04/21/16 1100 02/24/17 1255  BILITOT 0.8 0.8  AST 15 26  ALT 17 37*  ALKPHOS 53 59  PROT 7.5 7.3  ALBUMIN 4.3  4.3 4.1  4.1    TUMOR MARKERS: No results for input(s): AFPTM, CEA, CA199, CHROMGRNA in the last 8760 hours.  Assessment and Plan:  Proteinuria;  hematuria Increasing Creatinine Scheduled for random renal bx Risks and benefits discussed with the patient including, but not limited to bleeding, infection, damage to adjacent structures or low yield requiring additional tests. All of the patient's questions were answered, patient is agreeable to proceed. Consent signed and in chart.  Thank you for this interesting consult.  I greatly enjoyed meeting Linda Black and look forward to participating in their care.  A copy of this report was sent to the requesting provider on this date.  Electronically Signed: Lavonia Drafts, PA-C 03/27/2017, 7:32 AM   I spent a total of  30 Minutes   in face to face in clinical consultation, greater than 50% of which was counseling/coordinating care for random renal bx

## 2017-04-09 ENCOUNTER — Encounter (HOSPITAL_COMMUNITY): Payer: Self-pay

## 2017-04-27 ENCOUNTER — Encounter (HOSPITAL_COMMUNITY): Payer: Self-pay

## 2017-06-10 ENCOUNTER — Ambulatory Visit: Payer: Medicaid Other | Admitting: Medical

## 2017-06-10 ENCOUNTER — Encounter: Payer: Self-pay | Admitting: Medical

## 2017-06-10 VITALS — BP 124/70 | HR 89 | Temp 99.0°F | Wt 185.8 lb

## 2017-06-10 DIAGNOSIS — R112 Nausea with vomiting, unspecified: Secondary | ICD-10-CM

## 2017-06-10 DIAGNOSIS — Z603 Acculturation difficulty: Secondary | ICD-10-CM

## 2017-06-10 DIAGNOSIS — R109 Unspecified abdominal pain: Secondary | ICD-10-CM

## 2017-06-10 DIAGNOSIS — N02B9 Other recurrent and persistent immunoglobulin A nephropathy: Secondary | ICD-10-CM

## 2017-06-10 DIAGNOSIS — N289 Disorder of kidney and ureter, unspecified: Secondary | ICD-10-CM

## 2017-06-10 DIAGNOSIS — Z789 Other specified health status: Secondary | ICD-10-CM | POA: Diagnosis not present

## 2017-06-10 DIAGNOSIS — N028 Recurrent and persistent hematuria with other morphologic changes: Secondary | ICD-10-CM | POA: Diagnosis not present

## 2017-06-10 DIAGNOSIS — I10 Essential (primary) hypertension: Secondary | ICD-10-CM

## 2017-06-10 DIAGNOSIS — K219 Gastro-esophageal reflux disease without esophagitis: Secondary | ICD-10-CM | POA: Diagnosis not present

## 2017-06-10 LAB — COMPREHENSIVE METABOLIC PANEL
AG RATIO: 1.4 (calc) (ref 1.0–2.5)
ALBUMIN MSPROF: 3.7 g/dL (ref 3.6–5.1)
ALKALINE PHOSPHATASE (APISO): 50 U/L (ref 33–115)
ALT: 28 U/L (ref 6–29)
AST: 12 U/L (ref 10–35)
BUN / CREAT RATIO: 29 (calc) — AB (ref 6–22)
BUN: 63 mg/dL — ABNORMAL HIGH (ref 7–25)
CHLORIDE: 98 mmol/L (ref 98–110)
CO2: 23 mmol/L (ref 20–32)
CREATININE: 2.15 mg/dL — AB (ref 0.50–1.10)
Calcium: 8.6 mg/dL (ref 8.6–10.2)
GLOBULIN: 2.7 g/dL (ref 1.9–3.7)
Glucose, Bld: 99 mg/dL (ref 65–99)
Potassium: 4.8 mmol/L (ref 3.5–5.3)
Sodium: 130 mmol/L — ABNORMAL LOW (ref 135–146)
Total Bilirubin: 1.1 mg/dL (ref 0.2–1.2)
Total Protein: 6.4 g/dL (ref 6.1–8.1)

## 2017-06-10 LAB — POCT URINALYSIS DIP (PROADVANTAGE DEVICE)
BILIRUBIN UA: NEGATIVE
BILIRUBIN UA: NEGATIVE mg/dL
Glucose, UA: NEGATIVE mg/dL
LEUKOCYTES UA: NEGATIVE
Nitrite, UA: NEGATIVE
PH UA: 6 (ref 5.0–8.0)
Protein Ur, POC: NEGATIVE mg/dL
SPECIFIC GRAVITY, URINE: NEGATIVE
Urobilinogen, Ur: NEGATIVE

## 2017-06-10 LAB — CBC
HEMATOCRIT: 34.6 % — AB (ref 35.0–45.0)
Hemoglobin: 11.7 g/dL (ref 11.7–15.5)
MCH: 30.1 pg (ref 27.0–33.0)
MCHC: 33.8 g/dL (ref 32.0–36.0)
MCV: 88.9 fL (ref 80.0–100.0)
MPV: 9.1 fL (ref 7.5–12.5)
Platelets: 252 10*3/uL (ref 140–400)
RBC: 3.89 10*6/uL (ref 3.80–5.10)
RDW: 15.3 % — AB (ref 11.0–15.0)
WBC: 8 10*3/uL (ref 3.8–10.8)

## 2017-06-10 LAB — LIPASE: Lipase: 42 U/L (ref 7–60)

## 2017-06-10 MED ORDER — OMEPRAZOLE 40 MG PO CPDR: 40.0000 mg | DELAYED_RELEASE_CAPSULE | Freq: Every day | ORAL | 0 refills | Status: DC

## 2017-06-10 NOTE — Progress Notes (Signed)
Subjective: Chief Complaint  Patient presents with  . blood pressure reading are low.stomach pain    blood pressure runnig low x1 week, stomach pain nausea ,vomitting    Here with interpreter, daughter.   She has hx/o abnormal kidney function, HTN, proteinuria, and after last visit we sent her back to nephrology.  Since then she had renal biopsy earlier in the year and is now on a new set of medications for IgA Nephropathy.  She is here due to 1 week hx/ o abdominal pain, nausea, vomiting, acid reflux, and had isolated low BP reading yesterday.  Spicy foods worse her belly pain which is mostly upper abdominal pain.  She is having belching and epigastric discomfort.   She notes some back discomfort as well.   No urinary frequency, urgency, odor, blood.  No problems with defecation.  No fever.  reportedly had BP reading of 95//77 yesterday.    otherwise BP reportedly in normal range most of the time.  She notes being compliant with medications.  No CP, no SOB, no edema  No other aggravating or relieving factors. No other complaint.   Past Medical History:  Diagnosis Date  . Diabetes mellitus without complication ([place])   . Hypertension   . Renal disorder    Current Outpatient Medications on File Prior to Visit  Medication Sig Dispense Refill  . amLODipine (NORVASC) 5 MG tablet Take 1 tablet (5 mg total) by mouth daily. 90 tablet 1  . cyclophosphamide (CYTOXAN) 50 MG capsule Take 50 mg 2 (two) times daily by mouth.   0  . furosemide (LASIX) 40 MG tablet Take 20 mg 2 (two) times daily by mouth.   0  . lisinopril (PRINIVIL,ZESTRIL) 20 MG tablet Take 20 mg daily by mouth.    . predniSONE (DELTASONE) 20 MG tablet Take 20 mg daily with breakfast by mouth.   0  . rosuvastatin (CRESTOR) 20 MG tablet Take 1 tablet (20 mg total) by mouth daily. 90 tablet 1   No current facility-administered medications on file prior to visit.    ROS as in subjective   Objective: BP 124/70   Pulse 89    Temp 99 F (37.2 C)   Wt 185 lb 12.8 oz (84.3 kg)   SpO2 97%   BMI 36.29 kg/m   Wt Readings from Last 3 Encounters:  06/10/17 185 lb 12.8 oz (84.3 kg)  03/27/17 190 lb (86.2 kg)  02/24/17 192 lb (87.1 kg)   General appearance: alert, no distress, WD/WN,  HEENT: normocephalic, sclerae anicteric, TMs pearly, nares patent, no discharge or erythema, pharynx normal Oral cavity: MMM, no lesions Neck: supple, no lymphadenopathy, no thyromegaly, no masses Heart: RRR, normal S1, S2, no murmurs Lungs: CTA bilaterally, no wheezes, rhonchi, or rales Abdomen: +bs, soft, +epigastric and suprapubic tenderness, otherwise non tender, non distended, no masses, no hepatomegaly, no splenomegaly Pulses: 2+ symmetric, upper and lower extremities, normal cap refill Ext: no edema   Assessment: Encounter Diagnoses  Name Primary?  . Abdominal pain, unspecified abdominal location Yes  . Stomach pain   . Nausea and vomiting, intractability of vomiting not specified, unspecified vomiting type   . Renal insufficiency   . Essential hypertension   . Language barrier   . IgA nephropathy   . Gastroesophageal reflux disease, esophagitis presence not specified     Plan:  Reviewed 05/18/17 nephrology notes from [facility] and verified medications with her today.   She apparently is not taking atenolol listed, but is taking the  rest of the medications.  The spironolactone was stopped at prior nephrology visit.    The low BP readings yesterday was isolated, possible related to acute process.  Labs today.  Begin Omeprazole, can c/t OTC famotidine QHS, avoid GERD triggers.  HTN - c/t same medications.  She has not been taking beta blocker.  I will defer this to nephrology  UA reviewed.   Rashel was seen today for blood pressure reading are low.stomach pain.  Diagnoses and all orders for this visit:  Abdominal pain, unspecified abdominal location -     Comprehensive metabolic panel -     CBC -      Lipase  Stomach pain -     POCT Urinalysis DIP (Proadvantage Device) -     Comprehensive metabolic panel -     CBC -     Lipase  Nausea and vomiting, intractability of vomiting not specified, unspecified vomiting type -     Comprehensive metabolic panel -     CBC -     Lipase  Renal insufficiency -     Comprehensive metabolic panel  Essential hypertension  Language barrier  IgA nephropathy  Gastroesophageal reflux disease, esophagitis presence not specified  Other orders -     omeprazole (PRILOSEC) 40 MG capsule; Take 1 capsule (40 mg total) daily by mouth.

## 2017-06-10 NOTE — Addendum Note (Signed)
Addended by: Carlena Hurl on: 06/10/2017 02:03 PM   Modules accepted: Orders

## 2017-06-11 LAB — URINE CULTURE
MICRO NUMBER: 81252525
SPECIMEN QUALITY:: ADEQUATE

## 2017-07-24 ENCOUNTER — Encounter: Payer: Self-pay | Admitting: Medical

## 2017-08-04 DIAGNOSIS — E039 Hypothyroidism, unspecified: Secondary | ICD-10-CM

## 2017-08-04 DIAGNOSIS — K760 Fatty (change of) liver, not elsewhere classified: Secondary | ICD-10-CM

## 2017-08-04 HISTORY — DX: Hypothyroidism, unspecified: E03.9

## 2017-08-04 HISTORY — DX: Fatty (change of) liver, not elsewhere classified: K76.0

## 2017-08-05 ENCOUNTER — Other Ambulatory Visit: Payer: Self-pay | Admitting: Medical

## 2017-08-07 DIAGNOSIS — Z0279 Encounter for issue of other medical certificate: Secondary | ICD-10-CM

## 2017-09-02 ENCOUNTER — Encounter: Payer: Self-pay | Admitting: Medical

## 2017-09-02 ENCOUNTER — Ambulatory Visit: Payer: Medicaid Other | Admitting: Medical

## 2017-09-02 VITALS — BP 138/82 | HR 78 | Wt 188.4 lb

## 2017-09-02 DIAGNOSIS — K219 Gastro-esophageal reflux disease without esophagitis: Secondary | ICD-10-CM | POA: Diagnosis not present

## 2017-09-02 DIAGNOSIS — E1149 Type 2 diabetes mellitus with other diabetic neurological complication: Secondary | ICD-10-CM

## 2017-09-02 DIAGNOSIS — E785 Hyperlipidemia, unspecified: Secondary | ICD-10-CM

## 2017-09-02 DIAGNOSIS — R202 Paresthesia of skin: Secondary | ICD-10-CM | POA: Diagnosis not present

## 2017-09-02 DIAGNOSIS — I10 Essential (primary) hypertension: Secondary | ICD-10-CM | POA: Diagnosis not present

## 2017-09-02 DIAGNOSIS — N289 Disorder of kidney and ureter, unspecified: Secondary | ICD-10-CM

## 2017-09-02 DIAGNOSIS — Z758 Other problems related to medical facilities and other health care: Secondary | ICD-10-CM

## 2017-09-02 DIAGNOSIS — N028 Recurrent and persistent hematuria with other morphologic changes: Secondary | ICD-10-CM | POA: Diagnosis not present

## 2017-09-02 DIAGNOSIS — Z789 Other specified health status: Secondary | ICD-10-CM | POA: Diagnosis not present

## 2017-09-02 DIAGNOSIS — N02B9 Other recurrent and persistent immunoglobulin A nephropathy: Secondary | ICD-10-CM

## 2017-09-02 DIAGNOSIS — R809 Proteinuria, unspecified: Secondary | ICD-10-CM | POA: Diagnosis not present

## 2017-09-02 LAB — GLUCOSE, POCT (MANUAL RESULT ENTRY): POC GLUCOSE: 191 mg/dL — AB (ref 70–99)

## 2017-09-02 MED ORDER — INSULIN GLARGINE 100 UNITS/ML SOLOSTAR PEN: PEN_INJECTOR | SUBCUTANEOUS | 5 refills | Status: DC

## 2017-09-02 MED ORDER — INSULIN PEN NEEDLE 32G X 4 MM MISC: 1.0000 | Freq: Every day | 11 refills | Status: DC

## 2017-09-02 MED ORDER — OMEPRAZOLE 40 MG PO CPDR: 40.0000 mg | DELAYED_RELEASE_CAPSULE | Freq: Every day | ORAL | 3 refills | Status: DC

## 2017-09-02 MED ORDER — ROSUVASTATIN CALCIUM 20 MG PO TABS: 20.0000 mg | ORAL_TABLET | Freq: Every day | ORAL | 1 refills | Status: DC

## 2017-09-02 NOTE — Progress Notes (Signed)
Subjective: Chief Complaint  Patient presents with  . follow up from b/p    follow up for b/p and feet burning and feel hot x 1 week    Here for recheck.  Accompanied by daughter.   Here also with Interpreter Humberto Leep from [place].  Medical team: Dr. Vanetta Mulders with [facility] Cherlyn Syring, Camelia Eng, PA-C here for primary care It is not clear whether she is seeing a dentist and eye doctor not  Of note I started seeing her back in May 2017.  At that time she came to me with diagnosis of hypertension, kidney disease and diabetes However when discussing this today she reports no history of diabetes.  Her main concerns recently are burning sensation in both feet for about a week.  No injury, no trauma, no fall.   Has had this burning sensation in the past.   She denies polyuria, polydipsia, weight change.  Doesn't have a glucometer.  She is not on any diabetes medicine currently.  She has a history of hypertension.  After reviewing her nephrology notes from May 18, 2017, it appears that her reported medications are not accurate.  At the end of the visit she pulls out additional pill bottles.  So I personally updated the medication record.  She actually has an appointment to see [facility] again 4 pm tomorrow.  She has no other complaints.  No chest pain no difficulty breathing no other paresthesias.  She reports that she is not really exercising other than walking in general but not brisk walking.  She notes that she drinks mainly water and coffee.  She denies drinking soda or sweet tea or other sugary drinks.  She reports that she eats several fruit servings a day, eats brown rice, eats chicken and fish.  Denies fast food denies a lot of fried food.  Denies eating junk food.   Past Medical History:  Diagnosis Date  . Diabetes mellitus without complication ([place])   . Hypertension   . Renal disorder    Current Outpatient Medications on File Prior to Visit   Medication Sig Dispense Refill  . amLODipine (NORVASC) 5 MG tablet Take 1 tablet (5 mg total) by mouth daily. 90 tablet 1  . cyclophosphamide (CYTOXAN) 50 MG capsule Take 100 mg by mouth daily. Give on an empty stomach 1 hour before or 2 hours after meals.    . furosemide (LASIX) 40 MG tablet Take 40 mg by mouth daily.   0  . lisinopril (PRINIVIL,ZESTRIL) 40 MG tablet Take 40 mg by mouth daily.    . predniSONE (DELTASONE) 20 MG tablet Take 20 mg by mouth daily with breakfast.     No current facility-administered medications on file prior to visit.    Family History  Problem Relation Age of Onset  . Diabetes Mother   . Hypertension Father    No past surgical history on file.   ROS as in subjective    Objective: BP 138/82   Pulse 78   Wt 188 lb 6.4 oz (85.5 kg)   SpO2 98%   BMI 36.79 kg/m   General appearance: alert, no distress, WD/WN,  Neck: supple, no lymphadenopathy, no thyromegaly, no masses Heart: RRR, normal S1, S2, no murmurs Lungs: CTA bilaterally, no wheezes, rhonchi, or rales Psych: pleasant, good eye contact, answers questions appropriately Neuro: other than foot exam below, normal strength and sensation otherwise CN 2-12 intact  Diabetic Foot Exam - Simple   Simple Foot Form Diabetic Foot  exam was performed with the following findings:  Yes 09/02/2017 10:03 AM  Visual Inspection No deformities, no ulcerations, no other skin breakdown bilaterally:  Yes Sensation Testing See comments:  Yes Pulse Check See comments:  Yes Comments 1+  Pedal pulses, decreased monofilament sensation throughout feet bilat including lower legs and feet in stocking pattern     Assessment: Encounter Diagnoses  Name Primary?  . Type 2 diabetes mellitus with neurological complications ([place])   . Essential hypertension Yes  . Gastroesophageal reflux disease, esophagitis presence not specified   . Renal insufficiency   . IgA nephropathy   . Hyperlipidemia, unspecified  hyperlipidemia type   . Morbid obesity ([place])   . Language barrier   . Proteinuria, unspecified type   . Paresthesia      Plan: Paresthesias-likely due to diabetic neuropathy.   Will work to improve glucose control.  Diabetes type 2 uncontrolled-her fasting glucose today was 191 in the office.  We will check additional labs today.  We discussed diabetes diagnosis, she will start checking her sugars, and she will begin Lantus 5 units nightly with instructions to titrate up after 2 weeks slowly.  Her daughter is familiar with insulin as she is administering her grandmothers medication.  We went over proper use of the insulin and glucometer instructions.  I gave her a prescription for glucometer and testing supplies.  I asked her to begin checking her sugars daily fasting and follow-up in 1 month, sooner as needed.  I counseled on diet and exercise  Hypertension-based on nephrology notes from October and her current pill bottles, she is currently taking lisinopril 40 mg daily, Lasix 40 mg daily, amlodipine 5 mg daily.  Her nephrology notes from October shows Aldactone 25 mg but she says that was stopped by nephrology.  She is not currently taking Aldactone.  GERD-she feels that she needs to continue omeprazole at this time so this was refilled today  Obesity-discussed the need to lose weight and work on diet and exercise changes  IgA nephropathy, renal insufficiency, proteinuria-follow-up with nephrology tomorrow, labs today.  She is on immunosuppressive therapy per nephrology with prednisone and cyclophosphamide.  Continue statin.  Fasting labs today and we will send a copy to labs to nephrology for her appt tomorrow.   Spent > 45 minutes face to face with patient in discussion of symptoms, evaluation, plan and recommendations.     Trystyn was seen today for follow up from b/p.  Diagnoses and all orders for this visit:  Essential hypertension -     Renal Function Panel -     Hepatic  function panel -     Lipid panel -     VITAMIN D 25 Hydroxy (Vit-D Deficiency, Fractures)  Type 2 diabetes mellitus with neurological complications (HCC) -     Renal Function Panel -     Hepatic function panel -     Hemoglobin A1c -     HM DIABETES EYE EXAM -     HM DIABETES FOOT EXAM -     VITAMIN D 25 Hydroxy (Vit-D Deficiency, Fractures)  Gastroesophageal reflux disease, esophagitis presence not specified -     VITAMIN D 25 Hydroxy (Vit-D Deficiency, Fractures)  Renal insufficiency -     Renal Function Panel  IgA nephropathy  Hyperlipidemia, unspecified hyperlipidemia type  Morbid obesity (HCC)  Language barrier  Proteinuria, unspecified type  Paresthesia -     Vitamin B12 -     VITAMIN D 25 Hydroxy (Vit-D Deficiency,  Fractures)  Other orders -     insulin glargine (LANTUS) 100 unit/mL SOPN; Begin with 5 units at bedtime.   Can increase 2 units weekly until morning glucose consistenly <130 -     Insulin Pen Needle 32G X 4 MM MISC; 1 each by Does not apply route at bedtime. -     omeprazole (PRILOSEC) 40 MG capsule; Take 1 capsule (40 mg total) by mouth daily. -     rosuvastatin (CRESTOR) 20 MG tablet; Take 1 tablet (20 mg total) by mouth daily.

## 2017-09-02 NOTE — Addendum Note (Signed)
Addended by: Tyrone Apple on: 09/02/2017 02:50 PM   Modules accepted: Orders

## 2017-09-02 NOTE — Patient Instructions (Addendum)
Recommendations:  Start checking sugar  s daily in the morning fasting  Begin Lantus long acting insulin 5 units at bedtime daily.  After 2 weeks, if her morning sugars are >130 fasting, then she can increase the Lantus 2 units per week  For example, 2 weeks from now, if her morning sugars are running 150, then she would go up to 7 units at bedtime of the Lantus  Work on getting exercise 30 minutes 5 days per week such as walking  Limit rice servings to 3/4 cup, and only 1 grain serving per meal  Eat 3 fruit serving daily  Avoid fried food, sweets, candy, cakes, junk food, fast food.  Drink mainly water  Lets plan to see her back in 1 month

## 2017-09-03 ENCOUNTER — Other Ambulatory Visit: Payer: Self-pay | Admitting: Medical

## 2017-09-03 ENCOUNTER — Encounter: Payer: Self-pay | Admitting: Medical

## 2017-09-03 MED ORDER — POTASSIUM CHLORIDE ER 10 MEQ PO TBCR: 10.0000 meq | EXTENDED_RELEASE_TABLET | Freq: Two times a day (BID) | ORAL | 3 refills | Status: DC

## 2017-09-03 MED ORDER — VITAMIN D 1000 UNITS PO TABS: 1000.0000 [IU] | ORAL_TABLET | Freq: Every day | ORAL | 3 refills | Status: DC

## 2017-09-03 NOTE — Progress Notes (Signed)
results

## 2017-09-04 LAB — LIPID PANEL
Chol/HDL Ratio: 2.3 ratio (ref 0.0–4.4)
Cholesterol, Total: 236 mg/dL — ABNORMAL HIGH (ref 100–199)
HDL: 104 mg/dL (ref >39)
LDL CALC: 72 mg/dL (ref 0–99)
Triglycerides: 298 mg/dL — ABNORMAL HIGH (ref 0–149)
VLDL CHOLESTEROL CAL: 60 mg/dL — AB (ref 5–40)

## 2017-09-04 LAB — RENAL FUNCTION PANEL
Albumin: 4.3 g/dL (ref 3.5–5.5)
BUN / CREAT RATIO: 21 (ref 9–23)
BUN: 32 mg/dL — ABNORMAL HIGH (ref 6–24)
CALCIUM: 9.6 mg/dL (ref 8.7–10.2)
CHLORIDE: 104 mmol/L (ref 96–106)
CO2: 19 mmol/L — AB (ref 20–29)
Creatinine, Ser: 1.53 mg/dL — ABNORMAL HIGH (ref 0.57–1.00)
GFR calc non Af Amer: 41 mL/min/{1.73_m2} — ABNORMAL LOW (ref >59)
GFR, EST AFRICAN AMERICAN: 47 mL/min/{1.73_m2} — AB (ref >59)
GLUCOSE: 169 mg/dL — AB (ref 65–99)
POTASSIUM: 3.1 mmol/L — AB (ref 3.5–5.2)
Phosphorus: 3.9 mg/dL (ref 2.5–4.5)
Sodium: 143 mmol/L (ref 134–144)

## 2017-09-04 LAB — HEMOGLOBIN A1C
ESTIMATED AVERAGE GLUCOSE: 131 mg/dL
Hgb A1c MFr Bld: 6.2 % — ABNORMAL HIGH (ref 4.8–5.6)

## 2017-09-04 LAB — VITAMIN B12: Vitamin B-12: 528 pg/mL (ref 232–1245)

## 2017-09-04 LAB — HEPATIC FUNCTION PANEL
ALK PHOS: 48 IU/L (ref 39–117)
ALT: 32 IU/L (ref 0–32)
AST: 19 IU/L (ref 0–40)
BILIRUBIN TOTAL: 0.5 mg/dL (ref 0.0–1.2)
Bilirubin, Direct: 0.15 mg/dL (ref 0.00–0.40)
TOTAL PROTEIN: 6.8 g/dL (ref 6.0–8.5)

## 2017-09-04 LAB — VITAMIN D 25 HYDROXY (VIT D DEFICIENCY, FRACTURES): Vit D, 25-Hydroxy: 13 ng/mL — ABNORMAL LOW (ref 30.0–100.0)

## 2017-09-25 ENCOUNTER — Encounter: Payer: Self-pay | Admitting: Medical

## 2017-09-25 ENCOUNTER — Ambulatory Visit: Payer: Medicaid Other | Admitting: Medical

## 2017-09-25 VITALS — Temp 98.1°F | Wt 184.2 lb

## 2017-09-25 DIAGNOSIS — E1149 Type 2 diabetes mellitus with other diabetic neurological complication: Secondary | ICD-10-CM

## 2017-09-25 DIAGNOSIS — N028 Recurrent and persistent hematuria with other morphologic changes: Secondary | ICD-10-CM

## 2017-09-25 DIAGNOSIS — I10 Essential (primary) hypertension: Secondary | ICD-10-CM | POA: Diagnosis not present

## 2017-09-25 DIAGNOSIS — Z20828 Contact with and (suspected) exposure to other viral communicable diseases: Secondary | ICD-10-CM

## 2017-09-25 DIAGNOSIS — Z79899 Other long term (current) drug therapy: Secondary | ICD-10-CM | POA: Insufficient documentation

## 2017-09-25 DIAGNOSIS — Z789 Other specified health status: Secondary | ICD-10-CM | POA: Diagnosis not present

## 2017-09-25 DIAGNOSIS — R6883 Chills (without fever): Secondary | ICD-10-CM

## 2017-09-25 DIAGNOSIS — R42 Dizziness and giddiness: Secondary | ICD-10-CM

## 2017-09-25 DIAGNOSIS — R112 Nausea with vomiting, unspecified: Secondary | ICD-10-CM

## 2017-09-25 DIAGNOSIS — R6889 Other general symptoms and signs: Secondary | ICD-10-CM

## 2017-09-25 MED ORDER — PROMETHAZINE HCL 50 MG/ML IJ SOLN
50.0000 mg | Freq: Four times a day (QID) | INTRAMUSCULAR | Status: DC | PRN
  Administered 2017-09-25: 50 mg via INTRAMUSCULAR

## 2017-09-25 MED ORDER — OSELTAMIVIR PHOSPHATE 30 MG PO CAPS: 30.0000 mg | ORAL_CAPSULE | Freq: Two times a day (BID) | ORAL | 0 refills | Status: DC

## 2017-09-25 MED ORDER — ONDANSETRON HCL 4 MG PO TABS: 4.0000 mg | ORAL_TABLET | Freq: Three times a day (TID) | ORAL | 0 refills | Status: DC | PRN

## 2017-09-25 NOTE — Progress Notes (Signed)
Subjective: Chief Complaint  Patient presents with  . chills, headaches, dizzy , vomitting    started yesterday    Here today with her daughter for illness.  She notes 1.5-day history of chills, nausea, vomiting all day yesterday and so far today, has some heartburn, headache, dizziness, some cough.  Denies fever, heartburn, sore throat, diarrhea, abdominal or back pain.  Her 46 year old  grandson was diagnosed with the flu earlier in the week and she has been exposed to him.  She did take some of her medicines yesterday but threw them back up and did not take her medicines at all this morning.  She denies shortness of breath chest pain.  Her daughter notes that up until this week her blood pressures and blood sugars have been looking normal since her last visit.  no other aggravating or relieving factors. No other complaint.  Past Medical History:  Diagnosis Date  . Diabetes mellitus without complication ([place])   . Hypertension   . IgA nephropathy 2019  . Language barrier   . Obesity   . Proteinuria   . Renal disorder    Current Outpatient Medications on File Prior to Visit  Medication Sig Dispense Refill  . amLODipine (NORVASC) 5 MG tablet Take 1 tablet (5 mg total) by mouth daily. 90 tablet 1  . cholecalciferol (VITAMIN D) 1000 units tablet Take 1 tablet (1,000 Units total) by mouth daily. 90 tablet 3  . cyclophosphamide (CYTOXAN) 50 MG capsule Take 100 mg by mouth daily. Give on an empty stomach 1 hour before or 2 hours after meals.    . furosemide (LASIX) 40 MG tablet Take 40 mg by mouth daily.   0  . insulin glargine (LANTUS) 100 unit/mL SOPN Begin with 5 units at bedtime.   Can increase 2 units weekly until morning glucose consistenly <130 15 mL 5  . Insulin Pen Needle 32G X 4 MM MISC 1 each by Does not apply route at bedtime. 100 each 11  . lisinopril (PRINIVIL,ZESTRIL) 40 MG tablet Take 40 mg by mouth daily.    Marland Kitchen omeprazole (PRILOSEC) 40 MG capsule Take 1 capsule (40 mg total) by  mouth daily. 90 capsule 3  . potassium chloride (KLOR-CON 10) 10 MEQ tablet Take 1 tablet (10 mEq total) by mouth 2 (two) times daily. 180 tablet 3  . rosuvastatin (CRESTOR) 20 MG tablet Take 1 tablet (20 mg total) by mouth daily. 90 tablet 1   No current facility-administered medications on file prior to visit.    ROS as in subjective   Objective: Temp 98.1 F (36.7 C)   Wt 184 lb 3.2 oz (83.6 kg)   SpO2 99%   BMI 35.97 kg/m   General: Ill-appearing, well-developed, well-nourished, lying on exam table Skin: Hot, dry HEENT: Nose inflamed and congested, clear conjunctiva, TMs pearly, no sinus tenderness, pharynx with erythema, no exudates Neck: Supple, non tender, shotty cervical adenopathy Heart: Regular rate and rhythm, normal S1, S2, no murmurs Lungs: Clear to auscultation bilaterally, no wheezes, rales, rhonchi Abdomen: Non tender non distended Extremities: Mild generalized tenderness    Assessment: Encounter Diagnoses  Name Primary?  . Nausea and vomiting, intractability of vomiting not specified, unspecified vomiting type Yes  . Dizziness   . Chill   . Flu-like symptoms   . Exposure to the flu   . Language barrier   . IgA nephropathy   . Type 2 diabetes mellitus with neurological complications ([place])   . Essential hypertension   . High risk  medication use     Plan: After discussing her symptoms, concerns, and exam findings, we discussed the likelihood that she has influenza but also has uncontrollable nausea and vomiting.  We gave her 25 mg of promethazine in the office which did help her nausea while she was here.  She has had nausea vomiting for about a day and a little over a day.  We discussed potentially going and having IV fluids at the emergency department, but after discussion she wants to try treatment at home.  We discussed the following recommendations and she and daughter voiced understanding and agreement of the recommendations.  Recommendations: Your  symptoms and exam findings today along with the recent exposure to influenza suggest that you have the flu virus  We gave you an injection of promethazine 25 mg today to help with the nausea and vomiting.  I will send a medicine to your pharmacy called Zofran to use for nausea and vomiting.  You can use this in 4-6 hours as needed  Over the next several hours I want you to really work on increasing your hydration.  Please drink small amounts of liquids throughout the hour every hour, including water, broth, soup, ice chips, tea, for example.  I want you to hydrate until the point you are urinating clear looking urine  Begin Tamiflu 1 capsule twice daily for 5 days for flu symptoms  I would like you to temporarily stop the following medications for 2-3 days until you feel much improved and are better hydrated Temporarily stop Lasix//Furosemide Temporarily take one half lisinopril 40 mg Temporarily stop potassium/Klor-Con  Continue all your other medicines as usual  Once you are hydrating better, urinating clear urine, and feeling significantly improved over the next 2-3 days, then add back the medicines you temporarily stopped  Rest  Let us plan to recheck in 1 week assuming you are much improved at that point  If you are not improving over the next 48 hours or are worse, then go to the emergency department

## 2017-09-25 NOTE — Patient Instructions (Signed)
   Recommendations: Your symptoms and exam findings today along with the recent exposure to influenza suggest that you have the flu virus  We gave you an injection of promethazine 25 mg today to help with the nausea and vomiting.  I will send a medicine to your pharmacy called Zofran to use for nausea and vomiting.  You can use this in 4-6 hours as needed  Over the next several hours I want you to really work on increasing your hydration.  Please drink small amounts of liquids throughout the hour every hour, including water, broth, soup, ice chips, tea, for example.  I want you to hydrate until the point you are urinating clear looking urine  Begin Tamiflu 1 capsule twice daily for 5 days for flu symptoms  I would like you to temporarily stop the following medications for 2-3 days until you feel much improved and are better hydrated Temporarily stop Lasix//Furosemide Temporarily take one half lisinopril 40 mg Temporarily stop potassium/Klor-Con  Continue all your other medicines as usual  Once you are hydrating better, urinating clear urine, and feeling significantly improved over the next 2-3 days, then add back the medicines you temporarily stopped  Rest  Let us plan to recheck in 1 week assuming you are much improved at that point  If you are not improving over the next 48 hours or are worse, then go to the emergency department

## 2017-09-30 ENCOUNTER — Encounter: Payer: Self-pay | Admitting: Medical

## 2017-09-30 ENCOUNTER — Ambulatory Visit: Payer: Medicaid Other | Admitting: Medical

## 2017-09-30 VITALS — BP 144/88 | HR 77 | Wt 185.6 lb

## 2017-09-30 DIAGNOSIS — E876 Hypokalemia: Secondary | ICD-10-CM | POA: Diagnosis not present

## 2017-09-30 DIAGNOSIS — Z79899 Other long term (current) drug therapy: Secondary | ICD-10-CM | POA: Diagnosis not present

## 2017-09-30 DIAGNOSIS — Z789 Other specified health status: Secondary | ICD-10-CM

## 2017-09-30 DIAGNOSIS — J111 Influenza due to unidentified influenza virus with other respiratory manifestations: Secondary | ICD-10-CM

## 2017-09-30 DIAGNOSIS — R112 Nausea with vomiting, unspecified: Secondary | ICD-10-CM | POA: Diagnosis not present

## 2017-09-30 DIAGNOSIS — N289 Disorder of kidney and ureter, unspecified: Secondary | ICD-10-CM

## 2017-09-30 DIAGNOSIS — E785 Hyperlipidemia, unspecified: Secondary | ICD-10-CM | POA: Diagnosis not present

## 2017-09-30 DIAGNOSIS — E1149 Type 2 diabetes mellitus with other diabetic neurological complication: Secondary | ICD-10-CM

## 2017-09-30 DIAGNOSIS — N028 Recurrent and persistent hematuria with other morphologic changes: Secondary | ICD-10-CM | POA: Diagnosis not present

## 2017-09-30 DIAGNOSIS — I10 Essential (primary) hypertension: Secondary | ICD-10-CM

## 2017-09-30 NOTE — Progress Notes (Signed)
Subjective: Chief Complaint  Patient presents with  . Follow-up    1 month follow up , from new meds    Here for follow-up.  She is accompanied by her daughter and the interpreter from [employer].  I saw her last week for flu and she is pretty much back to normal.  She said the day she was here in the injection of promethazine really helped in all the nausea and vomiting went away.  She was able to start back on fluids right away that evening, and started feeling better within the next few days.  At this point she has resumed the medication as we held temporarily last week.  She has no specific complaints today  She is also here from a month ago where we started Lantus 5 units at night.  Her morning sugars have been under 130, and she continues on Lantus 5 nightly.  No polyuria no polydipsia.  She is getting some limited to walking for exercise.  She is trying to be careful with her diet.  She is compliant with her other medications listed today..  She sees nephrology again later this week  Past Medical History:  Diagnosis Date  . Diabetes mellitus without complication ([place])   . Hypertension   . IgA nephropathy 2019  . Language barrier   . Obesity   . Proteinuria   . Renal disorder    Current Outpatient Medications on File Prior to Visit  Medication Sig Dispense Refill  . amLODipine (NORVASC) 5 MG tablet Take 1 tablet (5 mg total) by mouth daily. 90 tablet 1  . cholecalciferol (VITAMIN D) 1000 units tablet Take 1 tablet (1,000 Units total) by mouth daily. 90 tablet 3  . cyclophosphamide (CYTOXAN) 50 MG capsule Take 100 mg by mouth daily. Give on an empty stomach 1 hour before or 2 hours after meals.    . furosemide (LASIX) 40 MG tablet Take 40 mg by mouth daily.   0  . insulin glargine (LANTUS) 100 unit/mL SOPN Begin with 5 units at bedtime.   Can increase 2 units weekly until morning glucose consistenly <130 15 mL 5  . Insulin Pen Needle 32G X 4 MM MISC 1 each by Does not apply  route at bedtime. 100 each 11  . lisinopril (PRINIVIL,ZESTRIL) 40 MG tablet Take 40 mg by mouth daily.    Marland Kitchen omeprazole (PRILOSEC) 40 MG capsule Take 1 capsule (40 mg total) by mouth daily. 90 capsule 3  . ondansetron (ZOFRAN) 4 MG tablet Take 1 tablet (4 mg total) by mouth every 8 (eight) hours as needed for nausea or vomiting. 20 tablet 0  . potassium chloride (KLOR-CON 10) 10 MEQ tablet Take 1 tablet (10 mEq total) by mouth 2 (two) times daily. 180 tablet 3  . rosuvastatin (CRESTOR) 20 MG tablet Take 1 tablet (20 mg total) by mouth daily. 90 tablet 1   Current Facility-Administered Medications on File Prior to Visit  Medication Dose Route Frequency Provider Last Rate Last Dose  . promethazine (PHENERGAN) injection 50 mg  50 mg Intramuscular Q6H PRN Carlena Hurl, PA-C   50 mg at 09/25/17 1215   ROS as in subjective    Objective: BP (!) 144/88   Pulse 77   Wt 185 lb 9.6 oz (84.2 kg)   SpO2 98%   BMI 36.25 kg/m   General appearance: alert, no distress, WD/WN,  HEENT: normocephalic, sclerae anicteric, TMs pearly, nares patent, no discharge or erythema, pharynx normal Oral cavity: MMM,  no lesions Neck: supple, no lymphadenopathy, no thyromegaly, no masses Heart: RRR, normal S1, S2, no murmurs Lungs: CTA bilaterally, no wheezes, rhonchi, or rales Abdomen: +bs, soft, non tender, non distended, no masses, no hepatomegaly, no splenomegaly Pulses: 2+ symmetric, upper and lower extremities, normal cap refill Ext: no edema    Assessment: Encounter Diagnoses  Name Primary?  . Essential hypertension Yes  . Type 2 diabetes mellitus with neurological complications ([place])   . IgA nephropathy   . Renal insufficiency   . Hypokalemia   . High risk medication use   . Hyperlipidemia, unspecified hyperlipidemia type   . Language barrier   . Influenza   . Nausea and vomiting, intractability of vomiting not specified, unspecified vomiting type     Plan: Type 2 diabetes - glad to hear  sugars are stable, c/t Lantus 5 u QHS but can increase 2 units weekly if glucose is running >130 consistently.   counseled on diet, increasing exercise.   F/u 62mo  IgA nephropathy, Hypertension-continue current medicines follow-up with nephrology this week  BMET lab today given abnormalities on 50mo ago lab.  Influenza, vomiting - resolved  Katianna was seen today for follow-up.  Diagnoses and all orders for this visit:  Essential hypertension -     Basic metabolic panel  Type 2 diabetes mellitus with neurological complications (HCC) -     Basic metabolic panel  IgA nephropathy  Renal insufficiency  Hypokalemia -     Basic metabolic panel  High risk medication use  Hyperlipidemia, unspecified hyperlipidemia type  Language barrier  Influenza  Nausea and vomiting, intractability of vomiting not specified, unspecified vomiting type

## 2017-10-01 LAB — BASIC METABOLIC PANEL
BUN/Creatinine Ratio: 12 (ref 9–23)
BUN: 18 mg/dL (ref 6–24)
CALCIUM: 9.5 mg/dL (ref 8.7–10.2)
CHLORIDE: 104 mmol/L (ref 96–106)
CO2: 22 mmol/L (ref 20–29)
CREATININE: 1.51 mg/dL — AB (ref 0.57–1.00)
GFR, EST AFRICAN AMERICAN: 48 mL/min/{1.73_m2} — AB (ref >59)
GFR, EST NON AFRICAN AMERICAN: 41 mL/min/{1.73_m2} — AB (ref >59)
Glucose: 99 mg/dL (ref 65–99)
Potassium: 3.9 mmol/L (ref 3.5–5.2)
Sodium: 143 mmol/L (ref 134–144)

## 2017-10-27 ENCOUNTER — Telehealth: Payer: Self-pay

## 2017-10-27 ENCOUNTER — Ambulatory Visit: Payer: Medicaid Other | Admitting: Medical

## 2017-10-27 ENCOUNTER — Encounter: Payer: Self-pay | Admitting: Medical

## 2017-10-27 VITALS — BP 132/90 | HR 82 | Ht 60.0 in | Wt 180.2 lb

## 2017-10-27 DIAGNOSIS — R0602 Shortness of breath: Secondary | ICD-10-CM

## 2017-10-27 DIAGNOSIS — I10 Essential (primary) hypertension: Secondary | ICD-10-CM

## 2017-10-27 DIAGNOSIS — R0789 Other chest pain: Secondary | ICD-10-CM | POA: Diagnosis not present

## 2017-10-27 DIAGNOSIS — K649 Unspecified hemorrhoids: Secondary | ICD-10-CM | POA: Insufficient documentation

## 2017-10-27 DIAGNOSIS — R1011 Right upper quadrant pain: Secondary | ICD-10-CM | POA: Insufficient documentation

## 2017-10-27 DIAGNOSIS — E1149 Type 2 diabetes mellitus with other diabetic neurological complication: Secondary | ICD-10-CM

## 2017-10-27 DIAGNOSIS — R112 Nausea with vomiting, unspecified: Secondary | ICD-10-CM

## 2017-10-27 DIAGNOSIS — N028 Recurrent and persistent hematuria with other morphologic changes: Secondary | ICD-10-CM | POA: Diagnosis not present

## 2017-10-27 MED ORDER — ONDANSETRON HCL 4 MG PO TABS: 4.0000 mg | ORAL_TABLET | Freq: Three times a day (TID) | ORAL | 0 refills | Status: DC | PRN

## 2017-10-27 MED ORDER — PROMETHAZINE HCL 25 MG PO TABS: ORAL_TABLET | ORAL | 0 refills | Status: DC

## 2017-10-27 MED ORDER — HYDROCORTISONE 2.5 % RE CREA: 1.0000 "application " | TOPICAL_CREAM | Freq: Two times a day (BID) | RECTAL | 0 refills | Status: DC

## 2017-10-27 NOTE — Addendum Note (Signed)
Addended by: Carlena Hurl on: 10/27/2017 08:59 AM   Modules accepted: Orders

## 2017-10-27 NOTE — Progress Notes (Addendum)
Subjective:  Linda Black is a 47 y.o. female who presents for Chief Complaint  Patient presents with  . Acute Visit    2 weeks, vomiting before and after she is eating     Here today accompanied by her daughter who translates.  She has a complaint of vomiting, possibly related to eating.  She has a history significant for diabetes, IgA nephropathy, hypertension, proteinuria, obesity.  For the past 2 weeks, every time she eats gets nausea, wants to vomit.  She has vomited several times this week, daily.  Is taking zofran but its not helping.   She has had some recent RUQ pains.  No shoulder pain, but does get some right sided back pain.   She gets pain in chest with vomiting and with the nauea and RUQ pain.   Sometimes seems difficult to breath.   No bowel issue currently, no diarrhea, no constipation.   No blood in stool.  No urinary c/o.   Sometimes hot then cold feeling.  When she gets chest pain it is tightness, but no associated SOB, sweats.    Diet recall Typically rice each meal.   Eats fish, vegetables,liquid noodle soup.  Sometimes eats bread instead of rice for breakfast.  Does eat fried fish but not frying other things.    At the end of the visit she mentions a small itchy external hemorrhoid.   No other aggravating or relieving factors.    No other c/o.  The following portions of the patient's history were reviewed and updated as appropriate: allergies, current medications, past family history, past medical history, past social history, past surgical history and problem list.  ROS Otherwise as in subjective above  Past Medical History:  Diagnosis Date  . Diabetes mellitus without complication ([place])   . Hypertension   . IgA nephropathy 2019  . Language barrier   . Obesity   . Proteinuria   . Renal disorder    Past Surgical History:  Procedure Laterality Date  . NO PAST SURGERIES  10/2017    Current Outpatient Medications on File Prior to Visit  Medication Sig  Dispense Refill  . amLODipine (NORVASC) 5 MG tablet Take 1 tablet (5 mg total) by mouth daily. 90 tablet 1  . cholecalciferol (VITAMIN D) 1000 units tablet Take 1 tablet (1,000 Units total) by mouth daily. 90 tablet 3  . cyclophosphamide (CYTOXAN) 50 MG capsule Take 100 mg by mouth daily. Give on an empty stomach 1 hour before or 2 hours after meals.    . furosemide (LASIX) 40 MG tablet Take 40 mg by mouth daily.   0  . insulin glargine (LANTUS) 100 unit/mL SOPN Begin with 5 units at bedtime.   Can increase 2 units weekly until morning glucose consistenly <130 15 mL 5  . Insulin Pen Needle 32G X 4 MM MISC 1 each by Does not apply route at bedtime. 100 each 11  . lisinopril (PRINIVIL,ZESTRIL) 40 MG tablet Take 40 mg by mouth daily.    Marland Kitchen omeprazole (PRILOSEC) 40 MG capsule Take 1 capsule (40 mg total) by mouth daily. 90 capsule 3  . potassium chloride (KLOR-CON 10) 10 MEQ tablet Take 1 tablet (10 mEq total) by mouth 2 (two) times daily. 180 tablet 3  . rosuvastatin (CRESTOR) 20 MG tablet Take 1 tablet (20 mg total) by mouth daily. 90 tablet 1   Current Facility-Administered Medications on File Prior to Visit  Medication Dose Route Frequency Provider Last Rate Last Dose  .  promethazine (PHENERGAN) injection 50 mg  50 mg Intramuscular Q6H PRN Carlena Hurl, PA-C   50 mg at 09/25/17 1215     Objective: BP 132/90 (BP Location: Right Arm, Patient Position: Sitting, Cuff Size: Normal)   Pulse 82   Ht 5' (1.524 m)   Wt 180 lb 3.2 oz (81.7 kg)   SpO2 98%   BMI 35.19 kg/m   Wt Readings from Last 3 Encounters:  10/27/17 180 lb 3.2 oz (81.7 kg)  09/30/17 185 lb 9.6 oz (84.2 kg)  09/25/17 184 lb 3.2 oz (83.6 kg)   BP Readings from Last 3 Encounters:  10/27/17 132/90  09/30/17 (!) 144/88  09/02/17 138/82    General appearance: alert, no distress, well developed, well nourished Oral cavity: MMM, no lesions Neck: supple, no lymphadenopathy, no thyromegaly, no masses Heart: RRR, normal S1,  S2, no murmurs Chest wall nontender, no deformity Lungs: CTA bilaterally, no wheezes, rhonchi, or rales Abdomen: +bs, soft, tender RUQ and mildly epigastric, otherwise non tender, non distended, no masses, no hepatomegaly, no splenomegaly Pulses: 2+ radial pulses, 2+ pedal pulses, normal cap refill Ext: no edema   Adult ECG Report  Indication: chest discomfort  Rate: 78 bpm  Rhythm: normal sinus rhythm  QRS Axis: 38 degrees  PR Interval: 142ms  QRS Duration: 182ms  QTc: 419ms  Conduction Disturbances: none  Other Abnormalities: none  Patient's cardiac risk factors are: diabetes mellitus, dyslipidemia and obesity (BMI >= 30 kg/m2).  EKG comparison: 02/2017  Narrative Interpretation: no acute changes     Assessment: Encounter Diagnoses  Name Primary?  . RUQ pain Yes  . Non-intractable vomiting with nausea, unspecified vomiting type   . Type 2 diabetes mellitus with neurological complications ([place])   . IgA nephropathy   . Chest discomfort   . Morbid obesity ([place])   . SOB (shortness of breath)   . Essential hypertension   . Hemorrhoids, unspecified hemorrhoid type      Plan: We discussed the differential, most likely gallbladder related or GERD related.  We will send for ultrasound, labs today.  Reviewed the recommendations below.  C/t omeprazole.  Your symptoms and exam today suggest possible gallbladder inflammation, but other things can cause the symptoms as well.  We will check labs today.  I am going to set you up for an abdominal ultrasound to look at the gallbladder.  For the next few days I would not eat anything heavy portion, avoid fried food fast food or fatty foods.  You can use Phenergan/promethazine.  You can use 1/2-1 tablet as needed for nausea and vomiting.  Caution as this can make you sleepy.   You can use Zofran for nausea instead of its milder nausea.  Zofran will not cause you to be sleepy.  If your pain becomes severe in the next few days, or if you  develop a fever over 101, cannot keep anything down then call back or go to the emergency department.   Her blood pressure medications are currently managed by nephrology.   Hemorrhoids - can use cream below, SITZ baths, discussed preventative measures.   [place] was seen today for acute visit.  Diagnoses and all orders for this visit:  RUQ pain -     EKG 12-Lead -     US Abdomen Complete; Future -     CBC with Differential/Platelet -     Comprehensive metabolic panel -     Lipase  Non-intractable vomiting with nausea, unspecified vomiting type -  EKG 12-Lead -     US Abdomen Complete; Future -     CBC with Differential/Platelet -     Comprehensive metabolic panel -     Lipase  Type 2 diabetes mellitus with neurological complications ([facility]) -     US Abdomen Complete; Future  IgA nephropathy -     US Abdomen Complete; Future  Chest discomfort -     EKG 12-Lead  Morbid obesity (HCC)  SOB (shortness of breath) -     EKG 12-Lead  Essential hypertension -     EKG 12-Lead  Hemorrhoids, unspecified hemorrhoid type  Other orders -     promethazine (PHENERGAN) 25 MG tablet; 1/2/-1 tablet po q6hr prn nausea -     ondansetron (ZOFRAN) 4 MG tablet; Take 1 tablet (4 mg total) by mouth every 8 (eight) hours as needed for nausea or vomiting. -     hydrocortisone (ANUSOL-HC) 2.5 % rectal cream; Place 1 application rectally 2 (two) times daily.   Follow up: pending labs, [place]

## 2017-10-27 NOTE — Telephone Encounter (Signed)
Called patient, LVM advising son that we had scheduled for appointment for [place]. Left information of appointment, and advised to call back if any questions or concerns.

## 2017-10-27 NOTE — Patient Instructions (Signed)
Thank you for giving me the opportunity to serve you today and trusting [place] with your care.    Your diagnosis today includes: Encounter Diagnoses  Name Primary?  . RUQ pain Yes  . Non-intractable vomiting with nausea, unspecified vomiting type   . Type 2 diabetes mellitus with neurological complications ([place])   . IgA nephropathy   . Chest discomfort   . Morbid obesity ([place])   . SOB (shortness of breath)   . Essential hypertension     Your symptoms and exam today suggest possible gallbladder inflammation, but other things can cause the symptoms as well.  We will check labs today.  I am going to set you up for an abdominal ultrasound to look at the gallbladder.  For the next few days I would not eat anything heavy portion, avoid fried food fast food or fatty foods.  You can use Phenergan/promethazine.  You can use 1/2-1 tablet as needed for nausea and vomiting.  Caution as this can make you sleepy.   You can use Zofran for nausea instead of its milder nausea.  Zofran will not cause you to be sleepy.  If your pain becomes severe in the next few days, or if you develop a fever over 101, cannot keep anything down then call back or go to the emergency department.   I have included other useful information below for your review.   Cholecystitis Cholecystitis is swelling and irritation (inflammation) of the gallbladder. The gallbladder is an organ that is shaped like a pear. It is under the liver on the right side of the body. This condition is often caused by gallstones. You doctor may do tests to see how your gallbladder works. These tests may include:  Imaging tests, such as: ? An ultrasound. ? MRI.  Tests that check how your liver works.  This condition needs treatment. Follow these instructions at home: Home care will depend on your treatment. In general:  Take over-the-counter and prescription medicines only as told by your doctor.  If you were prescribed an antibiotic medicine,  take it as told by your doctor. Do not stop taking the antibiotic even if you start to feel better.  Follow instructions from your doctor about what to eat or drink. When you are allowed to eat, avoid eating or drinking anything that causes your symptoms to start.  Keep all follow-up visits as told by your doctor. This is important.  Contact a doctor if:  You have pain and your medicine does not help.  You have a fever. Get help right away if:  Your pain moves to: ? Another part of your belly (abdomen). ? Your back.  Your symptoms do not go away.  You have new symptoms. This information is not intended to replace advice given to you by your health care provider. Make sure you discuss any questions you have with your health care provider. Document Released: 07/10/2011 Document Revised: 12/27/2015 Document Reviewed: 11/01/2014 Elsevier Interactive Patient Education  2018 [place].    Vim ti m?t Cholecystitis Vim ti m?t l m?t b?nh vim c?a ti m?t. B?nh th??ng ???c g?i l c?n ?au ti m?t. Ti m?t l m?t b? ph?n c hnh qu? l n?m d??i gan ? bn ph?i c? th?. Ti m?t d? tr? m?t, m?t ch?t d?ch gip c? th? tiu ha ch?t bo. N?u d?ch m?t tch t? trong ti m?t, ti m?t s? b? vim. Tnh tr?ng ny c th? ??t nhin x?y ra (c?p tnh). Nh?ng ??t vim  ti m?t c?p tnh ho?c nh?ng ??t vim ko di c th? d?n ??n tnh tr?ng b?nh ko di (m?n tnh). Vim ti m?t l b?nh nghim tr?ng v c?n ???c ?i?u tr?Lourdes Sledge nhn g gy ra? Nguyn nhn ph? bi?n nh?t c?a tnh tr?ng ny l s?i m?t. S?i m?t c th? lm t?c ?ng d?n (?ng) d?n m?t ra kh?i ti m?t. Vi?c ny lm cho d?ch m?t tch t?. Nh?ng nguyn nhn khc c?a tnh tr?ng ny bao g?m:  T?n th??ng ti m?t do gi?m l?u l??ng mu.  Nhi?m trng ?ng m?t.  S?o ho?c ch? xo?n trn ?ng m?t.  Kh?i u trong gan, l lch, ho?c ti m?t.  ?i?u g lm t?ng nguy c?? Tnh tr?ng ny hay x?y ra h?n ?:  Nh?ng ng??i b? b?nh h?ng c?u hnh li?m.  Nh?ng ng??i  dng thu?c trnh [place] ho?c s? d?ng estrogen.  Nh?ng ng??i b? b?nh gan do r??u.  Nh?ng ng??i b? x? gan.  Nh?ng ng??i ???c truy?n ch?t dinh d??ng qua t?nh m?ch (nui d??ng qua ???ng t?nh m?ch).  Nh?ng ng??i khng ?n ho?c u?ng (nh?n ?n) trong m?t th?i gian di.  Nh?ng ng??i bo ph.  Nh?ng ng??i b? st cn nhanh.  Nh?ng ng??i c [place].  Nh?ng ng??i t?ng n?ng ?? triglyceride.  Nh?ng ng??i b? vim t?y.  Cc d?u hi?u ho?c tri?u ch?ng l g? Nh?ng tri?u ch?ng c?a tnh tr?ng ny bao g?m:  ?au b?ng, ??c bi?t l ? vng b?ng pha trn bn ph?i.  B?ng nh?y c?m ?au ho?c b? ch??ng.  Bu?n nn.  Nn.  S?t.  ?n l?nh.  Vng da v vng ph?n lng tr?ng m?t (vng da).  Ch?n ?on tnh tr?ng ny nh? th? no? Tnh tr?ng ny ???c ch?n ?on d?a vo khai thc b?nh s? v khm th?c th?Sander Nephew v? c?ng c th? ph?i lm cc ki?m tra khc, bao g?m:  Ca?c ki?m tra hnh ?nh, nh?: ? Siu m ti m?t. ? Ch?p CT vng b?ng. ? Ch?p ti m?t h?t nhn (ch?p HIDA). Vi?c ch?p ny cho php chuyn gia ch?m [place] s?c kh?e xem d?ch m?t di chuy?n t? gan ??n ti m?t v ??n ru?t non c?a qu v?. ? Ch?p MRI.  Xt nghi?m mu, ch?ng h?n: ? Cng th?c mu, v s? l??ng b?ch c?u c th? cao h?n bnh th??ng. ? Xt nghi?m ch?c n?ng gan, v m?t s? n?ng ?? c th? cao h?n bnh th??ng v?i m?t s? lo?i s?i m?t.  Tnh tr?ng ny ???c ?i?u tr? nh? th? no? ?i?u tr? c th? bao g?m:  Nh?n ?n trong m?t th?i gian nh?t ??nh.  Truy?n d?ch qua t?nh m?ch.  Thu?c ?? gi?m ?au ho?c ch?ng nn.  Thu?c khng sinh.  Ph?u thu?t ?? lo?i b? ti m?t (c?t ti m?t). Vi?c ny c th? x?y ra ngay ho?c vo th?i gian sau.  Tun th? nh?ng h??ng d?n ny ? nh: Vi?c ch?m [place] t?i nh s? ty thu?c vo ?i?u tr? c?a qu v?. Ni chung:  Ch? s? d?ng thu?c khng k ??n v thu?c k ??n theo ch? d?n c?a chuyn gia ch?m [place] s?c kh?e.  N?u qu v? ???c k thu?c khng sinh, hy dng thu?c theo ch? d?n c?a chuyn gia ch?m [place] s?c kh?e. Khng d?ng u?ng thu?c khng sinh  ngay c? khi qu v? b?t ??u c?m th?y ?? h?n.  Tun th? theo ch? ??n c?a chuyn gia ch?m [place] s?c kh?e v? vi?c ?n ho?c u?ng g. Khi qu v? ???  c php ?n, trnh ?n ho?c u?ng b?t k? th? g gy ra tri?u ch?ng c?a qu v?.  Tun th? t?t c? cc l?n khm theo di theo ch? d?n c?a chuyn gia ch?m [place] s?c kh?e. ?i?u ny c vai tr quan tr?ng.  Hy lin l?c v?i chuyn gia ch?m [place] s?c kh?e n?u:  C?n ?au c?a qu v? khng ki?m sot ???c b?ng thu?c.  Qu v? b? s?t. Yu c?u tr? gip ngay l?p t?c n?u:  C?n ?au c?a qu v? lan ra m?t ph?n khc c?a b?ng ho?c lan ra l?ng.  Qu v? ti?p t?c c tri?u ch?ng ho?c c tri?u ch?ng m?i k? c? khi ???c ?i?u tr?Tera Mater tin ny khng nh?m m?c ?ch thay th? cho l?i khuyn m chuyn gia ch?m [place] s?c kh?e ni v?i qu v?. Hy b?o ??m qu v? ph?i th?o lu?n b?t k? v?n ?? g m qu v? c v?i chuyn gia ch?m [place] s?c kh?e c?a qu v?. Document Released: 07/21/2005 Document Revised: 11/03/2016 Document Reviewed: 11/01/2014 Elsevier Interactive Patient Education  2018 [employer].

## 2017-10-28 LAB — COMPREHENSIVE METABOLIC PANEL
ALT: 41 IU/L — ABNORMAL HIGH (ref 0–32)
AST: 29 IU/L (ref 0–40)
Albumin/Globulin Ratio: 1.8 (ref 1.2–2.2)
Albumin: 4.6 g/dL (ref 3.5–5.5)
Alkaline Phosphatase: 66 IU/L (ref 39–117)
BUN/Creatinine Ratio: 12 (ref 9–23)
BUN: 20 mg/dL (ref 6–24)
Bilirubin Total: 0.7 mg/dL (ref 0.0–1.2)
CALCIUM: 9.7 mg/dL (ref 8.7–10.2)
CO2: 22 mmol/L (ref 20–29)
CREATININE: 1.62 mg/dL — AB (ref 0.57–1.00)
Chloride: 103 mmol/L (ref 96–106)
GFR calc Af Amer: 44 mL/min/{1.73_m2} — ABNORMAL LOW (ref >59)
GFR calc non Af Amer: 38 mL/min/{1.73_m2} — ABNORMAL LOW (ref >59)
GLOBULIN, TOTAL: 2.6 g/dL (ref 1.5–4.5)
Glucose: 89 mg/dL (ref 65–99)
Potassium: 4 mmol/L (ref 3.5–5.2)
SODIUM: 144 mmol/L (ref 134–144)
Total Protein: 7.2 g/dL (ref 6.0–8.5)

## 2017-10-28 LAB — CBC WITH DIFFERENTIAL/PLATELET
Basophils Absolute: 0 10*3/uL (ref 0.0–0.2)
Basos: 1 %
EOS (ABSOLUTE): 0.5 10*3/uL — ABNORMAL HIGH (ref 0.0–0.4)
EOS: 10 %
HEMATOCRIT: 38.3 % (ref 34.0–46.6)
HEMOGLOBIN: 13.1 g/dL (ref 11.1–15.9)
IMMATURE GRANULOCYTES: 0 %
Immature Grans (Abs): 0 10*3/uL (ref 0.0–0.1)
Lymphocytes Absolute: 1.1 10*3/uL (ref 0.7–3.1)
Lymphs: 21 %
MCH: 32.2 pg (ref 26.6–33.0)
MCHC: 34.2 g/dL (ref 31.5–35.7)
MCV: 94 fL (ref 79–97)
MONOCYTES: 10 %
MONOS ABS: 0.5 10*3/uL (ref 0.1–0.9)
NEUTROS PCT: 58 %
Neutrophils Absolute: 3.1 10*3/uL (ref 1.4–7.0)
Platelets: 327 10*3/uL (ref 150–379)
RBC: 4.07 x10E6/uL (ref 3.77–5.28)
RDW: 14.2 % (ref 12.3–15.4)
WBC: 5.4 10*3/uL (ref 3.4–10.8)

## 2017-10-28 LAB — LIPASE: LIPASE: 32 U/L (ref 14–72)

## 2017-10-29 ENCOUNTER — Telehealth: Payer: Self-pay

## 2017-10-29 ENCOUNTER — Ambulatory Visit (HOSPITAL_COMMUNITY)
Admission: RE | Admit: 2017-10-29 | Discharge: 2017-10-29 | Disposition: A | Discharge status: Home / Self Care | Payer: Medicaid Other | Source: Ambulatory Visit | Attending: Medical | Admitting: Medical

## 2017-10-29 ENCOUNTER — Ambulatory Visit (HOSPITAL_COMMUNITY): Payer: BLUE CROSS/BLUE SHIELD

## 2017-10-29 DIAGNOSIS — R1011 Right upper quadrant pain: Secondary | ICD-10-CM | POA: Diagnosis not present

## 2017-10-29 DIAGNOSIS — E1149 Type 2 diabetes mellitus with other diabetic neurological complication: Secondary | ICD-10-CM | POA: Insufficient documentation

## 2017-10-29 DIAGNOSIS — N028 Recurrent and persistent hematuria with other morphologic changes: Secondary | ICD-10-CM | POA: Diagnosis not present

## 2017-10-29 DIAGNOSIS — R112 Nausea with vomiting, unspecified: Secondary | ICD-10-CM

## 2017-10-29 NOTE — Telephone Encounter (Signed)
-----   Message from Carlena Hurl, PA-C sent at 10/29/2017  3:37 PM EDT ----- Ultrasound shows fatty liver but no obvious gall bladder issues.   If still having same symptoms, lets refer to gastroenterology for further evaluation ASAP

## 2017-10-29 NOTE — Telephone Encounter (Signed)
Called patient and she requested to have [place] call back tomorrow to discuss her lab results

## 2017-10-30 ENCOUNTER — Telehealth: Payer: Self-pay

## 2017-10-30 NOTE — Telephone Encounter (Signed)
-----   Message from Carlena Hurl, PA-C sent at 10/29/2017  3:37 PM EDT ----- Ultrasound shows fatty liver but no obvious gall bladder issues.   If still having same symptoms, lets refer to gastroenterology for further evaluation ASAP

## 2017-10-30 NOTE — Telephone Encounter (Signed)
Called patient and advised of results with son, no questions or concerns at this time. Will call back if they would like to do referral.

## 2017-11-04 ENCOUNTER — Other Ambulatory Visit: Payer: BLUE CROSS/BLUE SHIELD

## 2018-01-05 ENCOUNTER — Ambulatory Visit: Payer: Medicaid Other | Admitting: Medical

## 2018-01-05 ENCOUNTER — Telehealth: Payer: Self-pay | Admitting: Medical

## 2018-01-05 VITALS — BP 146/90 | HR 72 | Resp 16 | Ht 61.0 in | Wt 175.0 lb

## 2018-01-05 DIAGNOSIS — N028 Recurrent and persistent hematuria with other morphologic changes: Secondary | ICD-10-CM

## 2018-01-05 DIAGNOSIS — E876 Hypokalemia: Secondary | ICD-10-CM | POA: Diagnosis not present

## 2018-01-05 DIAGNOSIS — R0602 Shortness of breath: Secondary | ICD-10-CM | POA: Diagnosis not present

## 2018-01-05 DIAGNOSIS — I10 Essential (primary) hypertension: Secondary | ICD-10-CM

## 2018-01-05 DIAGNOSIS — Z789 Other specified health status: Secondary | ICD-10-CM

## 2018-01-05 DIAGNOSIS — R202 Paresthesia of skin: Secondary | ICD-10-CM | POA: Diagnosis not present

## 2018-01-05 DIAGNOSIS — E1149 Type 2 diabetes mellitus with other diabetic neurological complication: Secondary | ICD-10-CM

## 2018-01-05 DIAGNOSIS — R0789 Other chest pain: Secondary | ICD-10-CM

## 2018-01-05 DIAGNOSIS — R251 Tremor, unspecified: Secondary | ICD-10-CM | POA: Diagnosis not present

## 2018-01-05 DIAGNOSIS — N289 Disorder of kidney and ureter, unspecified: Secondary | ICD-10-CM

## 2018-01-05 DIAGNOSIS — E559 Vitamin D deficiency, unspecified: Secondary | ICD-10-CM | POA: Diagnosis not present

## 2018-01-05 DIAGNOSIS — E785 Hyperlipidemia, unspecified: Secondary | ICD-10-CM

## 2018-01-05 DIAGNOSIS — R7989 Other specified abnormal findings of blood chemistry: Secondary | ICD-10-CM | POA: Insufficient documentation

## 2018-01-05 NOTE — Progress Notes (Signed)
Subjective: Chief Complaint  Patient presents with  . chest pain    chest pain, stomach pain, SOB, numbness bilateral feet and hands, X 2 weeks    Here for variety of symptoms.  Accompanied by her daughter who interprets.     For past 2 weeks having heart burn, chest burning, feels hot inside her body.  Having some dyspnea.  No change with exercise, not worse with exercise.   She reports ongoing numbness in the feet, she reports a tremor that is present at rest or with activity, mild but notices it all the time.  This has gradually gotten worse.  Denies any recent fall, no slurred speech, no blurred vision, no change in affect, no confusion.  Glucose hasn't been low or high with symptoms.   Recent glucose in the low 100s.  Daughter notes her BP can be lower when she feels these symptoms.  Apparently she has had some 90/70 reading.   Denies any recent hypoglycemia.  She is compliant with her medications but sometimes she does not take some of her medicine if she feels really bad or if her blood pressure is low.  Past Medical History:  Diagnosis Date  . Diabetes mellitus without complication ([place])   . Hypertension   . IgA nephropathy 2019  . Language barrier   . Obesity   . Proteinuria   . Renal disorder    Current Outpatient Medications on File Prior to Visit  Medication Sig Dispense Refill  . amLODipine (NORVASC) 5 MG tablet Take 1 tablet (5 mg total) by mouth daily. 90 tablet 1  . cholecalciferol (VITAMIN D) 1000 units tablet Take 1 tablet (1,000 Units total) by mouth daily. 90 tablet 3  . cyclophosphamide (CYTOXAN) 50 MG capsule Take 100 mg by mouth daily. Give on an empty stomach 1 hour before or 2 hours after meals.    . furosemide (LASIX) 40 MG tablet Take 40 mg by mouth daily.   0  . hydrocortisone (ANUSOL-HC) 2.5 % rectal cream Place 1 application rectally 2 (two) times daily. 30 g 0  . insulin glargine (LANTUS) 100 unit/mL SOPN Begin with 5 units at bedtime.   Can increase 2  units weekly until morning glucose consistenly <130 15 mL 5  . Insulin Pen Needle 32G X 4 MM MISC 1 each by Does not apply route at bedtime. 100 each 11  . lisinopril (PRINIVIL,ZESTRIL) 40 MG tablet Take 40 mg by mouth daily.    Marland Kitchen omeprazole (PRILOSEC) 40 MG capsule Take 1 capsule (40 mg total) by mouth daily. 90 capsule 3  . ondansetron (ZOFRAN) 4 MG tablet Take 1 tablet (4 mg total) by mouth every 8 (eight) hours as needed for nausea or vomiting. 20 tablet 0  . potassium chloride (KLOR-CON 10) 10 MEQ tablet Take 1 tablet (10 mEq total) by mouth 2 (two) times daily. 180 tablet 3  . rosuvastatin (CRESTOR) 20 MG tablet Take 1 tablet (20 mg total) by mouth daily. 90 tablet 1  . promethazine (PHENERGAN) 25 MG tablet 1/2/-1 tablet po q6hr prn nausea (Patient not taking: Reported on 01/05/2018) 20 tablet 0   Current Facility-Administered Medications on File Prior to Visit  Medication Dose Route Frequency Provider Last Rate Last Dose  . promethazine (PHENERGAN) injection 50 mg  50 mg Intramuscular Q6H PRN Carlena Hurl, PA-C   50 mg at 09/25/17 1215   ROS as in subjective     Objective: BP (!) 146/90   Pulse 72   Resp  16   Ht 5\' 1"  (1.549 m)   Wt 175 lb (79.4 kg)   SpO2 98%   BMI 33.07 kg/m   Wt Readings from Last 3 Encounters:  01/05/18 175 lb (79.4 kg)  10/27/17 180 lb 3.2 oz (81.7 kg)  09/30/17 185 lb 9.6 oz (84.2 kg)   BP Readings from Last 3 Encounters:  01/05/18 (!) 146/90  10/27/17 132/90  09/30/17 (!) 144/88    General appearance: alert, no distress, WD/WN,  HEENT: normocephalic, sclerae anicteric, PERRLA, EOMi, nares patent, no discharge or erythema, pharynx normal Oral cavity: MMM, no lesions Neck: supple, no lymphadenopathy, no thyromegaly, no masses, no bruits Heart: RRR, normal S1, S2, no murmurs Lungs: CTA bilaterally, no wheezes, rhonchi, or rales Abdomen: +bs, soft, non tender, non distended, no masses, no hepatomegaly, no splenomegaly Musculoskeletal:  nontender, no swelling, no obvious deformity, no atrophy Extremities: no edema, no cyanosis, no clubbing Pulses: 2+ symmetric, upper and lower extremities, normal cap refill Neurological: slight tremor noted mainly at rest, but somewhat noted with action.  See foot exam.  Otherwise alert, oriented x 3, CN2-12 intact, strength normal upper extremities and lower extremities, DTRs 1+ throughout, no cerebellar signs, gait normal Psychiatric: normal affect, behavior normal, pleasant    Diabetic Foot Exam - Simple   Simple Foot Form Diabetic Foot exam was performed with the following findings:  Yes 01/05/2018 10:04 AM  Visual Inspection No deformities, no ulcerations, no other skin breakdown bilaterally:  Yes Sensation Testing See comments:  Yes Pulse Check See comments:  Yes Comments 1+ pedal pulses, decreased monofilament sensation throughout volar feet bilat     Adult ECG Report  Indication: chest discomfort  Rate: 62 bpm  Rhythm: normal sinus rhythm  QRS Axis: 42 degrees  PR Interval: 13ms  QRS Duration: 110ms  QTc: 486ms  Conduction Disturbances: none  Other Abnormalities: none  Patient's cardiac risk factors are: diabetes mellitus, dyslipidemia and hypertension.  EKG comparison: 10/2017  Narrative Interpretation: no acute changes     Assessment: Encounter Diagnoses  Name Primary?  . Type 2 diabetes mellitus with neurological complications ([place]) Yes  . Essential hypertension   . Renal insufficiency   . IgA nephropathy   . Hyperlipidemia, unspecified hyperlipidemia type   . Hypokalemia   . SOB (shortness of breath)   . Chest discomfort   . Language barrier   . Paresthesia   . Tremor   . Vitamin D deficiency   . Abnormal thyroid blood test     Plan: I reviewed her March 2019 office notes.  She has a variety of symptoms that she is talked about before.  EKG without acute changes.  We will do some labs today.  Her tremor is mild but may be relatively new.  She does  have decreased monofilament sensation suggestive of diabetic neuropathy.  She already has other diabetic complications.    Continue vitamin D daily, continue current medications otherwise, continue to monitor blood pressure and blood sugars at home particularly when symptomatic.  Pending labs consider starting gabapentin for neuropathy.    Consider baseline consults with cardiology and neurology given her risk factors   Linda Black was seen today for chest pain.  Diagnoses and all orders for this visit:  Type 2 diabetes mellitus with neurological complications ([place]) -     Comprehensive metabolic panel -     Orthostatic vital signs  Essential hypertension -     Comprehensive metabolic panel -     TSH -  T4, free -     Orthostatic vital signs  Renal insufficiency -     Comprehensive metabolic panel  IgA nephropathy  Hyperlipidemia, unspecified hyperlipidemia type  Hypokalemia  SOB (shortness of breath)  Chest discomfort  Language barrier  Paresthesia  Tremor -     Comprehensive metabolic panel -     TSH -     T4, free -     Orthostatic vital signs  Vitamin D deficiency  Abnormal thyroid blood test -     Comprehensive metabolic panel -     TSH -     T4, free -     Orthostatic vital signs

## 2018-01-05 NOTE — Telephone Encounter (Signed)
Pt wants to know if he still needs to come in for schedule appointment on 01/11/18 since she was seen today

## 2018-01-05 NOTE — Telephone Encounter (Signed)
No, lets postpone that one

## 2018-01-05 NOTE — Patient Instructions (Signed)
Encounter Diagnoses  Name Primary?  . Type 2 diabetes mellitus with neurological complications ([place]) Yes  . Essential hypertension   . Renal insufficiency   . IgA nephropathy   . Hyperlipidemia, unspecified hyperlipidemia type   . Hypokalemia   . SOB (shortness of breath)   . Chest discomfort   . Language barrier   . Paresthesia   . Tremor   . Vitamin D deficiency   . Abnormal thyroid blood test    Recommendations  Continue to monitor blood sugars fasting in the morning, before meals, and anytime she does not feel good such as shakes or weakness  Similarly, monitor blood pressures.  Normal blood pressure is 120/70.  For her, if her blood sugar was less than 100/70, this may be a reason for her to feel weak or shaky.  Pending labs I may start her on a medicine called gabapentin to help with numbness or neuropathy related to diabetes.  This is likely why she has numbness in her feet  I may consider referring her to neurology consult about the tremor  Given her risk factors for heart disease including diabetes, kidney disease, high blood pressure, it may be time to go ahead and see a cardiologist for baseline evaluation of her heart. We will call back with lab results H? ???ng huy?t (Hypoglycemia) Ha? ????ng huy?t xa?y ra khi l???ng ????ng (glucose) trong ma?u qua? th?p. Glucose l m?t lo?i ???ng cung c?p ngu?n n?ng l??ng chnh cho c? th? qu v?. Nh?ng hoc-mn nh?t ?i?nh (insulin va? glucagon) ki?m sot l??ng glucose trong mu. Insulin lm gi?m l??ng glucose trong mu v glucagon lm t?ng l??ng glucose trong mu. H? ???ng huy?t c th? x?y ra do c qu nhi?u insulin trong mu, ho?c do khng ?n ?? th?c ?n ch?a glucose. H? ???ng huy?t c th? x?y ra ? nh?ng ng??i b? ho?c khng b? ti?u ???ng. N c th? pht tri?n nhanh chng v c th? l m?t tnh hu?ng c?p c?u. NGUYN NHN H? ???ng huy?t x?y ra th??ng xuyn nh?t ? nh?ng ng??i b? ti?u ???ng. N?u qu v? b? ti?u ???ng, h? ???ng huy?t c  th? x?y ra do: Thu?c tr? ti?u ???ng. Khng ?n ??, ho?c khng ?n ?? th??ng xuyn. T?ng ho?t ??ng thn th?. U?ng r??u, ??c bi?t khi qu v? ch?a ?n g. N?u qu v? khng b? ti?u ???ng, h? ???ng huy?t c th? x?y ra do: M?t kh?i u ? t?y. T?y l c? quan t?o ra insulin. Khng ?n ?u?, ho??c khng ?n trong m?t th??i gian da?i (nhi?n ?o?i). Nhi?m trng n?ng ho?c b? ?m ?nh h??ng ??n gan, tim ho?c th?n. M?t s? lo?i thu?c nh?t ??nh. Qu v? c?ng c th? b? h? ???ng huy?t ph?n ?ng. Tnh tr?ng ny gy h? ???ng huy?t trong vng 4 gi? sau khi ?n. Tnh tr?ng ny c th? x?y ra sau ph?u thu?t d? dy. ?i khi, nguyn nhn gy h? ???ng huy?t ph?n ?ng l khng r. CC Y?U T? [place]? ????ng huy?t hay xa?y ra h?n ??: Nh??ng ng???i b? b?nh ti?u ???ng v dng thu?c ?? gi?m glucose trong mu. Nh??ng ng??i l?m d?ng r??u. Nh?ng ng??i b? ?m n?ng. TRI?U CH?NG H? ???ng huy?t c th? khng gy tri?u ch?ng no. N?u qu v? c tri?u ch?ng, cc tri?u ch?ng ? c th? bao g?m: ?i. Lo u. ?? m? hi v c?m th?y ?m ??t. L l?n. Chng m?t ho?c c?m th?y chong vng. Bu?n ng?. Bu?n nn. Nh?p tim t?ng. ?au ??u.  Nhn m?. ??ng kinh. c m?ng. ?au nhi ho?c t ? quanh mi?ng, mi ho?c l??i. Thay ??i l??i no?i. Gi?m kh? n?ng t?p trung. Thay ??i s?? ph?i h??p. Ng? khng yn. Run ho?c l?c. Ng?t x?u. D? b? kch thch. CH?N ?ON H? ???ng huy?t ???c ch?n ?on nh? xt nghi?m mu ?o l??ng glucose trong mu c?a qu v?. Xt nghi?m mu ny ???c th?c hi?n trong khi qu v? ?ang c tri?u ch?ng. Chuyn gia ch?m [place] s?c kh?e c?a qu v? c?ng c th? khm th?c th? v khai thc b?nh s? c?a qu v?. N?u qu v? khng b? ti?u ???ng, cc xt nghi?m khc c th? ???c lm ?? tm nguyn nhn gy h? ???ng huy?t. ?I?U TR? Tnh tr?ng ny th??ng c th? ???c ?i?u tr? b?ng cch ?n ho?c u?ng ngay th? g ? ch?a glucose, ch?ng h?n nh?: 3-4 vin ???ng (vin glucose). Glucose d?ng gel, ?ng 15 gam. N??c p tri cy, 4 ao-x? (120 mL). Soda th???ng (khng  pha?i loa?i cho ng???i ?n king), 4 ao-x? (120 mL). S?a t bo, 4 ao-x? (120 mL). Vi vin k?o c?ng. ???ng ho?c m?t ong, 1 tha c ph. ?i?u tr? h? ???ng huy?t n?u qu v? b? ti?u t??ng N?u qu v? t?nh to v c th? nu?t m?t cch an ton, hy theo quy t?c 15:15: Dng 15 gam carbohydrate c tc d?ng nhanh. Cc l?a ch?n tc d?ng nhanh bao g?m: 1 ?ng glucose gel. 3 vin ???ng glucose. 6-8 vin k?o c?ng. 4 ao-x? (120 mL) n??c p tri cy. 4 ao-x? (120 mL) soda th???ng (khng pha?i loa?i cho ng???i ?n king). Ki?m tra ???ng huy?t c?a qu v? sau khi du?ng carbohydrate 15 pht. N?u l??ng ???ng huy?t ?o l?i v?n ? m?c 70 mg/dL (3,9 mmol/L) tr? xu?ng, ha?y la?i dng ti?p t? 15 gam carbohydrate. N?u l??ng ???ng huy?t cu?a quy? vi? khng t?ng qu 70 mg/dL (3,9 mmol/L) sau 3 l?n th??, quy? vi? c?n ???c ch?m so?c y t? kh?n c?p. Sau khi n?ng ?? glucose trong ma?u tr? l?i bnh th??ng, ha?y ?n m?t b?a ?n chi?nh ho?c m?t b??a ?n nh? trong vng 1 ti?ng. ?i?u tr? h? ???ng huy?t nghim tr?ng H? ???ng huy?t nghim tr?ng l khi l??ng ???ng huy?t c?a qu v? t? 54 mg/dL (3 mmol/L) tr? xu?ng. H? ???ng huy?t nghim tr?ng l m?t tnh hu?ng c?p c?u. Khng ch? xem tri?u ch?ng c h?t khng. Hy ?i khm ngay l?p t?c. G?i cho d?ch v? c?p c?u t?i ??a ph??ng (911 ? [place] Khng t? li xe ??n b?nh vi?n. N?u qu v? b? h? ???ng huy?t nghim tr?ng v qu v? khng th? ?n ho?c u?ng, qu v? c th? c?n tim glucagon. M?t ng??i trong gia ?nh ho?c b?n thn c?n h?c cch ki?m tra ???ng huy?t cho qu v? v cch tim glucagon cho qu v?. Hy h?i chuyn gia ch?m [place] s?c kh?e xem qu v? c c?n c s?n m?t b? ?? tim glucagon kh?n c?p khng. H? ???ng huy?t nghim tr?ng c th? c?n ???c ?i?u tr? trong b?nh vi?n. Vi?c ?i?u tr? c th? bao g?m truy?n glucose qua ???ng truy?n t?nh m?ch (IV). Qu v? c?ng c th? c?n ?i?u tr? nguyn nhn gy h? ???ng huy?t. H??NG D?N CH?M [place] T?I NH H??ng d?n chung Trnh b?t k? ch? ?? ?n no lm cho qu v? khng  ?n ?? th?c ?n. Ni v?i chuyn gia ch?m [place] s?c kh?e tr??c khi qu v? b?t ??u b?t k? ch? ?? ?n  m?i no. Ch? s? d?ng thu?c khng c?n k ??n v thu?c c?n k ??n theo ch? d?n c?a chuyn gia ch?m [place] s?c kh?e. Gi?i h?n l??ng r??u qu v? u?ng khng qu 1 ly m?i ngy v?i ph? n? khng mang thai v 2 ly m?i ngy v?i nam gi?i. M?t ly t??ng ???ng v?i 12 ao-x? bia, 5 ao-x? r??u vang, ho?c 1 ao-x? r??u m?nh. Tun th? t?t c? cc cu?c h?n khm l?i theo ch? d?n c?a chuyn gia ch?m [place] s?c kh?e. ?i?u ny c vai tr quan tr?ng. N?u qu v? b? ti?u ???ng: B?o ??m qu v? bi?t cc tri?u ch?ng c?a h? ???ng huy?t. Lun mang theo ?? ?n nhanh ch?a carbohydrate tc d?ng nhanh ?? ?i?u tr? h? ???ng huy?t. Lm theo k? ho?ch qu?n l ???ng huy?t c?a qu v?, nh? ch? d?n c?a chuyn gia ch?m [place] s?c kh?e. ??m b?o qu v?: Dng thu?c theo ch? d?n. Lm theo k? ho?ch t?p luy?n. Lm theo k? ho?ch ?n u?ng. ?n ?ng gi? khng b? b?a. Ki?m tra ????ng huy?t cu?a quy? vi? th???ng xuyn theo chi? d?n. Hy ch?c ch?n vi?c ki?m tra l??ng ???ng huy?t tr??c v sau khi t?p th? d?c. N?u qu v? t?p th? d?c ko di h?n ho?c khc v?i bnh th??ng, hy ki?m tra l??ng ???ng huy?t th??ng xuyn h?n. Lm theo k? ho?ch trong ngy b? b?nh c?a qu v? b?t c? lc no m qu v? khng th? ?n ho?c u?ng ???c bnh th??ng. L?p k? ho?ch ny tr??c v?i chuyn gia ch?m [place] s?c kh?e. Chia s? k? ho?ch qu?n l b?nh ti?u ???ng c?a qu v? ? n?i lm vi?c, tr??ng h?c va? gia ?i?nh quy? vi?. Ki?m tra [place] trong n??c ti?u khi qu v? b? b?nh v theo ch? d?n c?a chuyn gia ch?m [place] s?c kh?e. Mang theo th? c?nh bo y t? ho?c ?eo ?? trang s?c c c?nh bo y t?. N?u qu v? b? h? ???ng huy?t ph?n ?ng ho?c c l??ng ???ng huy?t th?p do nh?ng nguyn nhn khc: Hy theo di l??ng glucose trong mu c?a qu v? theo ch? d?n c?a chuyn gia ch?m [place] s?c kh?e. Tun th? ch? ??n c?a chuyn gia ch?m [place] s?c kh?e v? cc h?n ch? ?n ho?c u?ng. ?I KHM N?U: Qu v? g?p v?n ?? v?i vi?c gi? l??ng  glucose trong mu ? ph?m vi m?c tiu. Qu v? c nh?ng c?n h? ???ng huy?t th??ng xuyn. NGAY L?P T?C ?I KHM N?U: Qu v? ti?p t?c c cc tri?u ch?ng h? ???ng huy?t sau khi ?n ho?c u?ng ?? g ? c ch?a ???ng. L???ng ????ng huy?t cu?a quy? vi? l 54 mg/dL (3 mmol/L) ho?c th?p h?n. Qu v? b? m?t c?n ??ng kinh. Qu v? b? ng?t. Nh?ng tri?u ch?ng ny c th? l m?t v?n ?? nghim tr?ng c?n c?p c?u. Khng ch? xem tri?u ch?ng c h?t khng. Hy ?i khm ngay l?p t?c. G?i cho d?ch v? c?p c?u t?i ??a ph??ng (911 ? [place]?). Khng t? li xe ??n b?nh vi?n. Thng tin ny khng nh?m m?c ?ch thay th? cho l?i khuyn m chuyn gia ch?m [place] s?c kh?e ni v?i qu v?. Hy b?o ??m qu v? ph?i th?o lu?n b?t k? v?n ?? g m qu v? c v?i chuyn gia ch?m [place] s?c kh?e c?a qu v?. Document Released: 07/21/2005 Document Revised: 11/12/2015 Document Reviewed: 08/24/2015 Elsevier Interactive Patient Education  2018 [employer].  .

## 2018-01-06 ENCOUNTER — Other Ambulatory Visit: Payer: Self-pay | Admitting: Medical

## 2018-01-06 LAB — COMPREHENSIVE METABOLIC PANEL
ALT: 37 IU/L — AB (ref 0–32)
AST: 24 IU/L (ref 0–40)
Albumin/Globulin Ratio: 1.8 (ref 1.2–2.2)
Albumin: 4.4 g/dL (ref 3.5–5.5)
Alkaline Phosphatase: 71 IU/L (ref 39–117)
BUN/Creatinine Ratio: 20 (ref 9–23)
BUN: 34 mg/dL — ABNORMAL HIGH (ref 6–24)
Bilirubin Total: 0.4 mg/dL (ref 0.0–1.2)
CALCIUM: 9.7 mg/dL (ref 8.7–10.2)
CO2: 18 mmol/L — AB (ref 20–29)
CREATININE: 1.68 mg/dL — AB (ref 0.57–1.00)
Chloride: 107 mmol/L — ABNORMAL HIGH (ref 96–106)
GFR calc Af Amer: 42 mL/min/{1.73_m2} — ABNORMAL LOW (ref >59)
GFR, EST NON AFRICAN AMERICAN: 36 mL/min/{1.73_m2} — AB (ref >59)
GLOBULIN, TOTAL: 2.4 g/dL (ref 1.5–4.5)
Glucose: 86 mg/dL (ref 65–99)
Potassium: 5 mmol/L (ref 3.5–5.2)
Sodium: 141 mmol/L (ref 134–144)
Total Protein: 6.8 g/dL (ref 6.0–8.5)

## 2018-01-06 LAB — T4, FREE: FREE T4: 0.78 ng/dL — AB (ref 0.82–1.77)

## 2018-01-06 LAB — TSH: TSH: 2.5 u[IU]/mL (ref 0.450–4.500)

## 2018-01-06 MED ORDER — BASAGLAR KWIKPEN 100 UNIT/ML ~~LOC~~ SOPN: 53.0000 [IU] | PEN_INJECTOR | Freq: Every day | SUBCUTANEOUS | 11 refills | Status: DC

## 2018-01-06 MED ORDER — LEVOTHYROXINE SODIUM 50 MCG PO TABS: 50.0000 ug | ORAL_TABLET | Freq: Every day | ORAL | 11 refills | Status: DC

## 2018-01-06 MED ORDER — GABAPENTIN 100 MG PO CAPS: 100.0000 mg | ORAL_CAPSULE | Freq: Every day | ORAL | 11 refills | Status: DC

## 2018-01-06 MED ORDER — INSULIN PEN NEEDLE 32G X 4 MM MISC: 1.0000 | Freq: Every day | 11 refills | Status: DC

## 2018-01-06 MED ORDER — LISINOPRIL 40 MG PO TABS: 40.0000 mg | ORAL_TABLET | Freq: Every day | ORAL | 3 refills | Status: DC

## 2018-01-06 MED ORDER — AMLODIPINE BESYLATE 5 MG PO TABS: 5.0000 mg | ORAL_TABLET | Freq: Every day | ORAL | 3 refills | Status: DC

## 2018-01-06 MED ORDER — ROSUVASTATIN CALCIUM 20 MG PO TABS: 20.0000 mg | ORAL_TABLET | Freq: Every day | ORAL | 3 refills | Status: DC

## 2018-01-06 MED ORDER — FUROSEMIDE 40 MG PO TABS: 40.0000 mg | ORAL_TABLET | Freq: Every day | ORAL | 3 refills | Status: DC

## 2018-01-11 ENCOUNTER — Ambulatory Visit: Payer: Self-pay | Admitting: Medical

## 2018-01-15 ENCOUNTER — Ambulatory Visit: Payer: Medicaid Other | Admitting: Medical

## 2018-01-15 VITALS — BP 110/70 | HR 72 | Resp 16 | Ht 60.5 in | Wt 177.4 lb

## 2018-01-15 DIAGNOSIS — R220 Localized swelling, mass and lump, head: Secondary | ICD-10-CM

## 2018-01-15 DIAGNOSIS — I10 Essential (primary) hypertension: Secondary | ICD-10-CM | POA: Diagnosis not present

## 2018-01-15 DIAGNOSIS — M7989 Other specified soft tissue disorders: Secondary | ICD-10-CM

## 2018-01-15 DIAGNOSIS — N028 Recurrent and persistent hematuria with other morphologic changes: Secondary | ICD-10-CM | POA: Diagnosis not present

## 2018-01-15 NOTE — Patient Instructions (Signed)
We saw you today regarding facial and hand and feet swelling  This could be due to one of the following:  Allergic reaction  Due to kidney disease she has called IgA Nephropathy  Angioedema due to medication side effect  For the next few days, take OTC Benadryl/allergy pill (Diphenhydramine 25mg ) at bedtime for the next 3 days  If this worsens or doesn't improve, then call or return  Keep in mind that her IgA nephropathy can cause swelling in hands and legs  Keep in mind that Lisinopril blood pressure tablet can cause angioedema (swelling of face).   If she continues to get facial swelling, then STOP Lisinopril and let me know.  If she has worse hand and feet swelling along with weight gain of 3+ or more then do 1 and 1/2 tablet of Furosemide/lasix for 4-5 days and let us know.

## 2018-01-15 NOTE — Progress Notes (Addendum)
Subjective:  Linda Black is a 47 y.o. female who presents for Chief Complaint  Patient presents with  . swelling    hand itchy, hands feet and face swelling X 1 day     Here today with daughter who interprets.   Yesterday started getting swelling of face, eyelids, hands and feet.  Was somewhat itchy.   Denies reaction after eating, no recent change in medications.  No prior similar.  No rash.  No SOB, no CP, no change in urination.  No fever, no myalgias.  She is compliant with medications.  No other aggravating or relieving factors.  No other c/o.  The following portions of the patient's history were reviewed and updated as appropriate: allergies, current medications, past family history, past medical history, past social history, past surgical history and problem list.  ROS Otherwise as in subjective above  Objective: BP 110/70   Pulse 72   Resp 16   Ht 5' 0.5" (1.537 m)   Wt 177 lb 6.4 oz (80.5 kg)   SpO2 98%   BMI 34.08 kg/m   General appearance: alert, no distress, well developed, well nourished HEENT: slight swelling of bilat upper eyelids, otherwise normocephalic, sclerae anicteric, conjunctiva pink and moist, TMs pearly, nares patent, no discharge or erythema, pharynx normal Oral cavity: MMM, no lesions Neck: supple, no lymphadenopathy, no thyromegaly, no masses Heart: RRR, normal S1, S2, no murmurs Lungs: CTA bilaterally, no wheezes, rhonchi, or rales Abdomen: +bs, soft, non tender, non distended, no masses, no hepatomegaly, no splenomegaly Pulses: 2+ radial pulses, 2+ pedal pulses, normal cap refill Ext: no edema   Assessment: Encounter Diagnoses  Name Primary?  . Facial swelling Yes  . Swelling of both hands   . Bilateral swelling of feet   . IgA nephropathy   . Essential hypertension      Plan: We discussed symptoms and exam findings that were apparently worse yesterday.  I reviewed her labs from 10 days ago here.   Gave following recommendations.    Patient Instructions  We saw you today regarding facial and hand and feet swelling  This could be due to one of the following:  Allergic reaction  Due to kidney disease she has called IgA Nephropathy  Angioedema due to medication side effect  For the next few days, take OTC Benadryl/allergy pill (Diphenhydramine 25mg ) at bedtime for the next 3 days  If this worsens or doesn't improve, then call or return  Keep in mind that her IgA nephropathy can cause swelling in hands and legs  Keep in mind that Lisinopril blood pressure tablet can cause angioedema (swelling of face).   If she continues to get facial swelling, then STOP Lisinopril and let me know.  If she has worse hand and feet swelling along with weight gain of 3+ or more then do 1 and 1/2 tablet of Furosemide/lasix for 4-5 days and let us know.    Linda Black was seen today for swelling.  Diagnoses and all orders for this visit:  Facial swelling  Swelling of both hands  Bilateral swelling of feet  IgA nephropathy  Essential hypertension   Follow up: with call back next week

## 2018-01-18 IMAGING — US US BIOPSY
1 series · 9 of 9 positions shown · non-contrast
Comparison: none

INDICATION: 46-year-old female with proteinuria, unspecified type.

[Series 1: us biopsy · 0.23mm/px · 9 of 9 slices shown]
[im 1/9]
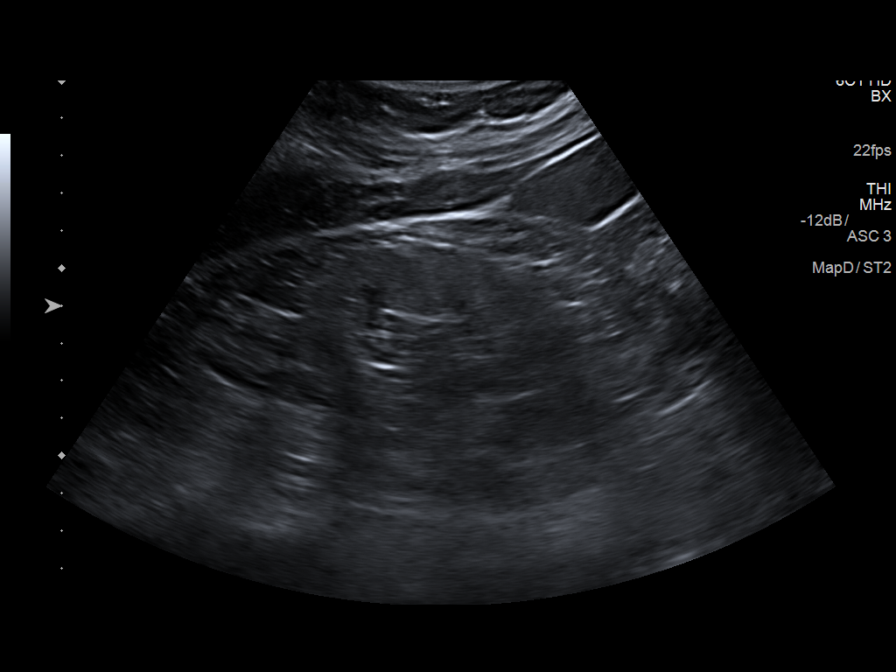
[im 2/9]
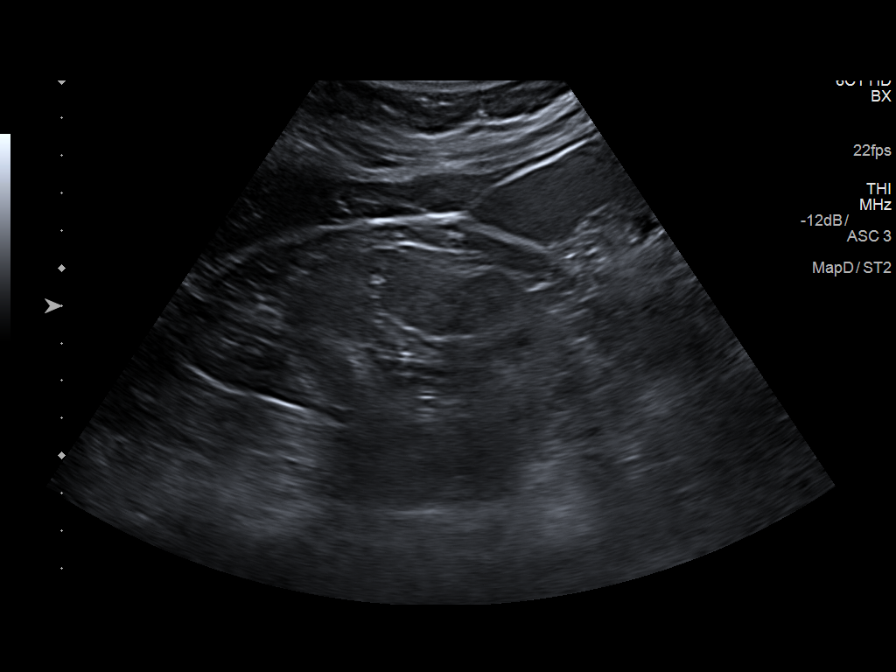
[im 3/9]
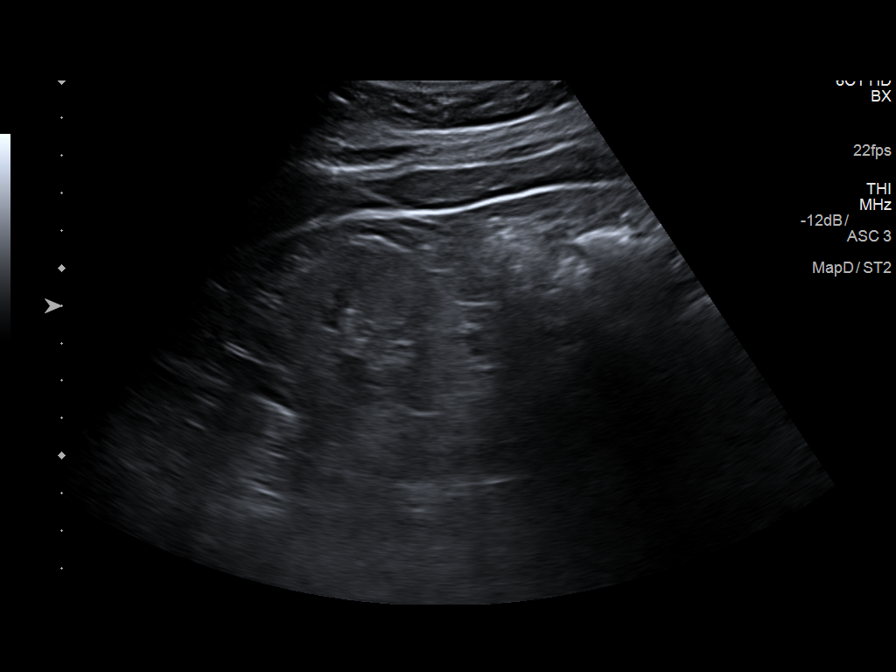
[im 4/9]
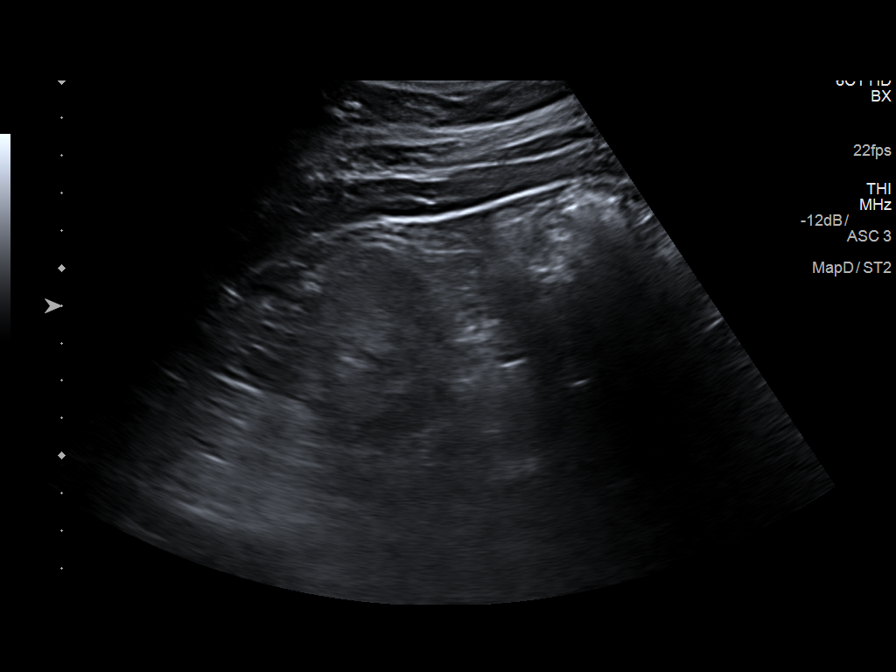
[im 5/9]
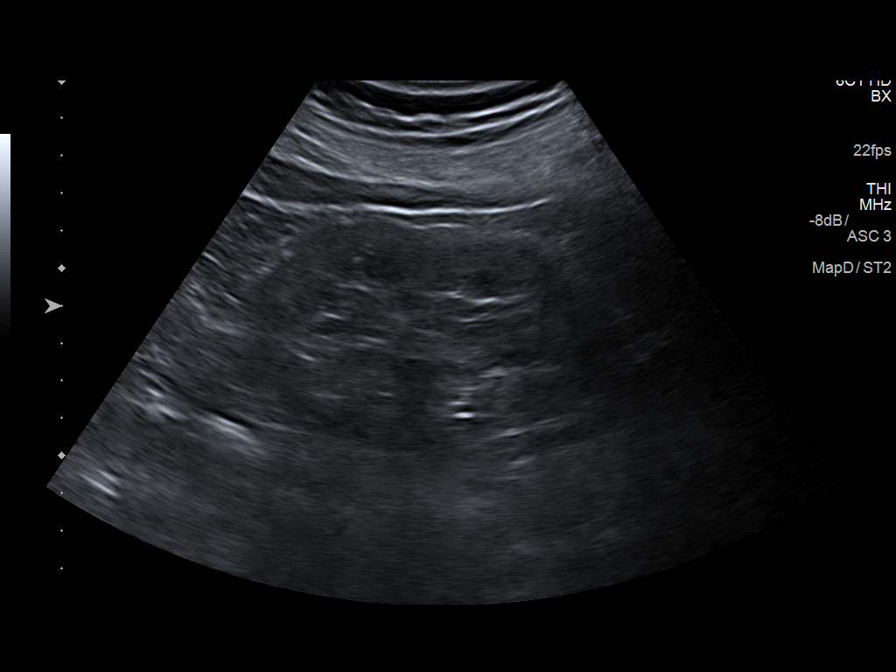
[im 6/9]
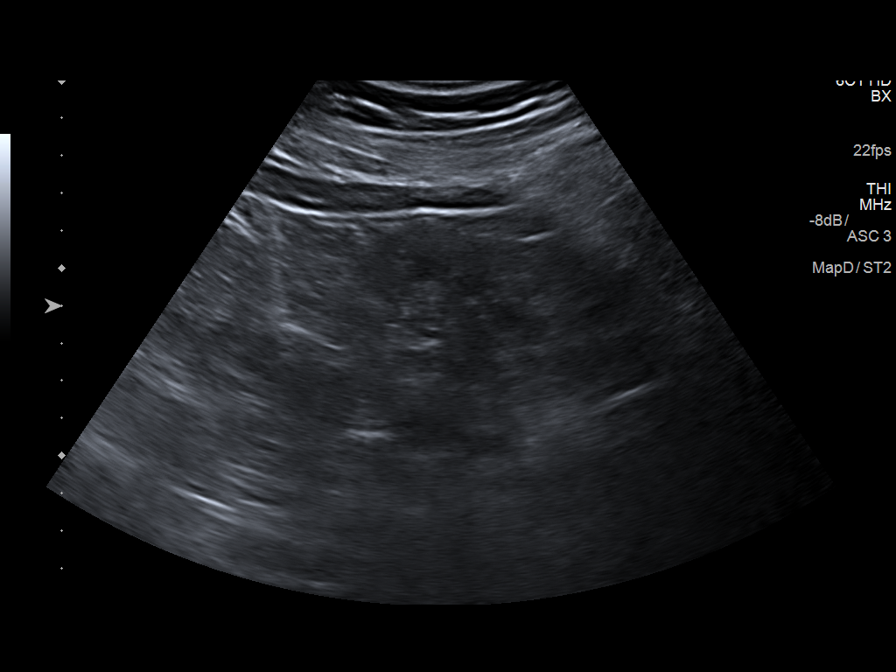
[im 7/9]
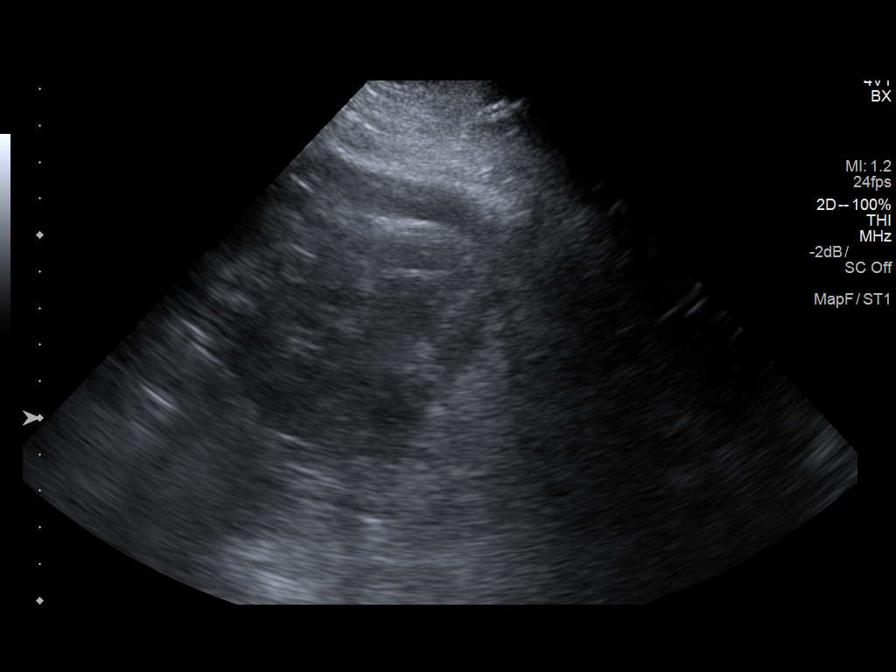
[im 8/9]
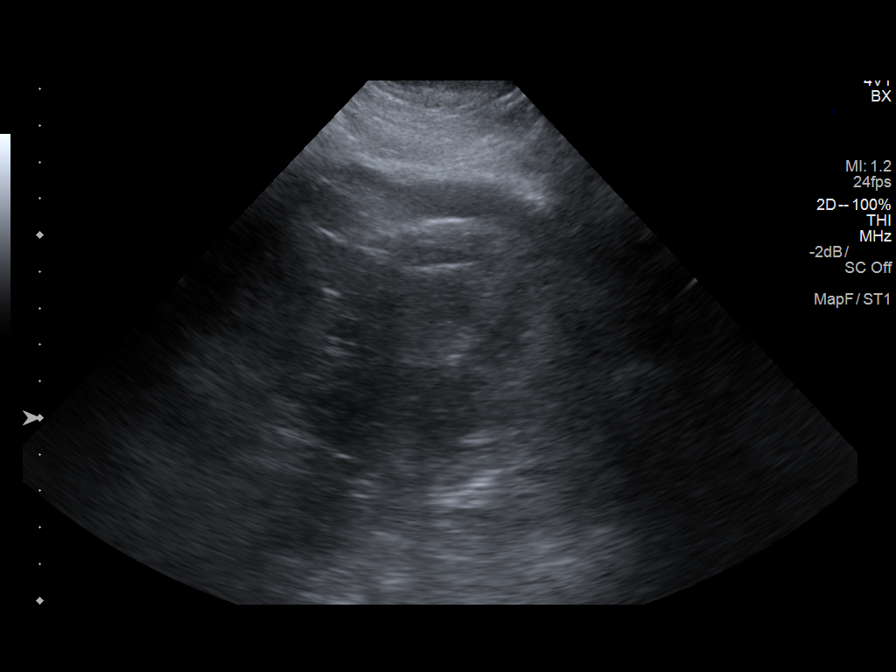
[im 9/9]
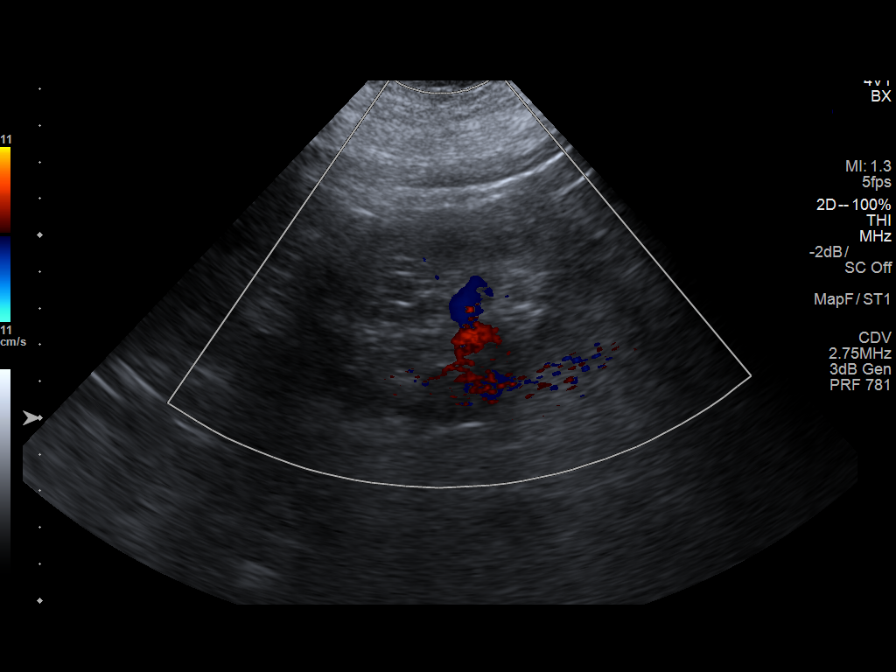

[9 of 9 positions shown; findings below may reference images not displayed]

EXAM:
ULTRASOUND-GUIDED BIOPSY OF RIGHT KIDNEY

MEDICATIONS:
None.

ANESTHESIA/SEDATION:
Moderate (conscious) sedation was employed during this procedure. A
total of Versed 2.0 mg and Fentanyl 100 mcg was administered
intravenously.

Moderate Sedation Time: 20 minutes. The patient's level of
consciousness and vital signs were monitored continuously by
radiology nursing throughout the procedure under my direct
supervision.

FLUOROSCOPY TIME:  None

COMPLICATIONS:
None immediate.

PROCEDURE:
Informed written consent was obtained from the patient with a
translator after a thorough discussion of the procedural risks,
benefits and alternatives. All questions were addressed. A timeout
was performed prior to the initiation of the procedure.

Patient was placed prone. Both kidneys were evaluated with
ultrasound. Both kidneys are echogenic and difficult to visualize.
The right kidney was selected for biopsy. The right flank was
prepped with chlorhexidine and sterile field was created. Skin and
soft tissues were anesthetized with 1% lidocaine. Using ultrasound
guidance, 16 gauge core biopsy was obtained from the right kidney
lower pole cortex. Specimens placed in saline. A second core biopsy
was obtained using ultrasound guidance. Both specimens were felt to
be adequate. Bandage placed over the puncture site.
FINDINGS: Both kidneys are echogenic. No hydronephrosis. Core biopsies
obtained from the right kidney lower pole. No significant bleeding
or hematoma formation following the core biopsies.
IMPRESSION: Successful ultrasound-guided core biopsies of the right kidney lower
pole.

## 2018-01-19 ENCOUNTER — Telehealth: Payer: Self-pay | Admitting: Medical

## 2018-01-19 ENCOUNTER — Encounter (HOSPITAL_COMMUNITY): Payer: Self-pay

## 2018-01-19 ENCOUNTER — Inpatient Hospital Stay (HOSPITAL_COMMUNITY)
Admission: EM | Admit: 2018-01-19 | Discharge: 2018-01-22 | DRG: 916 | Disposition: A | Discharge status: Home / Self Care | Payer: Medicaid Other | Attending: Internal Medicine | Admitting: Internal Medicine

## 2018-01-19 ENCOUNTER — Other Ambulatory Visit: Payer: Self-pay

## 2018-01-19 DIAGNOSIS — E669 Obesity, unspecified: Secondary | ICD-10-CM | POA: Diagnosis present

## 2018-01-19 DIAGNOSIS — I129 Hypertensive chronic kidney disease with stage 1 through stage 4 chronic kidney disease, or unspecified chronic kidney disease: Secondary | ICD-10-CM | POA: Diagnosis present

## 2018-01-19 DIAGNOSIS — D649 Anemia, unspecified: Secondary | ICD-10-CM | POA: Diagnosis present

## 2018-01-19 DIAGNOSIS — E785 Hyperlipidemia, unspecified: Secondary | ICD-10-CM | POA: Diagnosis present

## 2018-01-19 DIAGNOSIS — Z833 Family history of diabetes mellitus: Secondary | ICD-10-CM

## 2018-01-19 DIAGNOSIS — Z6834 Body mass index (BMI) 34.0-34.9, adult: Secondary | ICD-10-CM

## 2018-01-19 DIAGNOSIS — Z794 Long term (current) use of insulin: Secondary | ICD-10-CM

## 2018-01-19 DIAGNOSIS — T7840XD Allergy, unspecified, subsequent encounter: Secondary | ICD-10-CM

## 2018-01-19 DIAGNOSIS — T886XXA Anaphylactic reaction due to adverse effect of correct drug or medicament properly administered, initial encounter: Secondary | ICD-10-CM | POA: Diagnosis present

## 2018-01-19 DIAGNOSIS — E1149 Type 2 diabetes mellitus with other diabetic neurological complication: Secondary | ICD-10-CM | POA: Diagnosis present

## 2018-01-19 DIAGNOSIS — E876 Hypokalemia: Secondary | ICD-10-CM | POA: Diagnosis present

## 2018-01-19 DIAGNOSIS — E1122 Type 2 diabetes mellitus with diabetic chronic kidney disease: Secondary | ICD-10-CM | POA: Diagnosis present

## 2018-01-19 DIAGNOSIS — T7809XA Anaphylactic reaction due to other food products, initial encounter: Principal | ICD-10-CM | POA: Diagnosis present

## 2018-01-19 DIAGNOSIS — N189 Chronic kidney disease, unspecified: Secondary | ICD-10-CM | POA: Diagnosis present

## 2018-01-19 DIAGNOSIS — T464X5A Adverse effect of angiotensin-converting-enzyme inhibitors, initial encounter: Secondary | ICD-10-CM | POA: Diagnosis present

## 2018-01-19 DIAGNOSIS — E039 Hypothyroidism, unspecified: Secondary | ICD-10-CM | POA: Diagnosis present

## 2018-01-19 DIAGNOSIS — D631 Anemia in chronic kidney disease: Secondary | ICD-10-CM | POA: Diagnosis present

## 2018-01-19 DIAGNOSIS — T782XXA Anaphylactic shock, unspecified, initial encounter: Secondary | ICD-10-CM | POA: Diagnosis present

## 2018-01-19 DIAGNOSIS — E559 Vitamin D deficiency, unspecified: Secondary | ICD-10-CM | POA: Diagnosis present

## 2018-01-19 DIAGNOSIS — Z8249 Family history of ischemic heart disease and other diseases of the circulatory system: Secondary | ICD-10-CM

## 2018-01-19 DIAGNOSIS — Z79890 Hormone replacement therapy (postmenopausal): Secondary | ICD-10-CM

## 2018-01-19 DIAGNOSIS — N179 Acute kidney failure, unspecified: Secondary | ICD-10-CM | POA: Diagnosis present

## 2018-01-19 DIAGNOSIS — I10 Essential (primary) hypertension: Secondary | ICD-10-CM | POA: Diagnosis present

## 2018-01-19 DIAGNOSIS — N028 Recurrent and persistent hematuria with other morphologic changes: Secondary | ICD-10-CM | POA: Diagnosis present

## 2018-01-19 DIAGNOSIS — N183 Chronic kidney disease, stage 3 (moderate): Secondary | ICD-10-CM | POA: Diagnosis present

## 2018-01-19 DIAGNOSIS — K219 Gastro-esophageal reflux disease without esophagitis: Secondary | ICD-10-CM | POA: Diagnosis present

## 2018-01-19 LAB — CBC WITH DIFFERENTIAL/PLATELET
ABS IMMATURE GRANULOCYTES: 0.1 10*3/uL (ref 0.0–0.1)
BASOS ABS: 0 10*3/uL (ref 0.0–0.1)
BASOS PCT: 0 %
EOS ABS: 0.3 10*3/uL (ref 0.0–0.7)
Eosinophils Relative: 3 %
HCT: 35.5 % — ABNORMAL LOW (ref 36.0–46.0)
Hemoglobin: 11.7 g/dL — ABNORMAL LOW (ref 12.0–15.0)
Immature Granulocytes: 1 %
Lymphocytes Relative: 31 %
Lymphs Abs: 2.9 10*3/uL (ref 0.7–4.0)
MCH: 31.4 pg (ref 26.0–34.0)
MCHC: 33 g/dL (ref 30.0–36.0)
MCV: 95.2 fL (ref 78.0–100.0)
MONO ABS: 0.4 10*3/uL (ref 0.1–1.0)
Monocytes Relative: 4 %
NEUTROS PCT: 61 %
Neutro Abs: 5.7 10*3/uL (ref 1.7–7.7)
PLATELETS: 254 10*3/uL (ref 150–400)
RBC: 3.73 MIL/uL — ABNORMAL LOW (ref 3.87–5.11)
RDW: 12.1 % (ref 11.5–15.5)
WBC: 9.3 10*3/uL (ref 4.0–10.5)

## 2018-01-19 LAB — COMPREHENSIVE METABOLIC PANEL
ALT: 29 U/L (ref 14–54)
AST: 25 U/L (ref 15–41)
Albumin: 3.3 g/dL — ABNORMAL LOW (ref 3.5–5.0)
Alkaline Phosphatase: 54 U/L (ref 38–126)
Anion gap: 12 (ref 5–15)
BUN: 35 mg/dL — AB (ref 6–20)
CHLORIDE: 105 mmol/L (ref 101–111)
CO2: 22 mmol/L (ref 22–32)
Calcium: 8.4 mg/dL — ABNORMAL LOW (ref 8.9–10.3)
Creatinine, Ser: 2.67 mg/dL — ABNORMAL HIGH (ref 0.44–1.00)
GFR calc Af Amer: 24 mL/min — ABNORMAL LOW (ref >60)
GFR, EST NON AFRICAN AMERICAN: 20 mL/min — AB (ref >60)
Glucose, Bld: 248 mg/dL — ABNORMAL HIGH (ref 65–99)
POTASSIUM: 3.3 mmol/L — AB (ref 3.5–5.1)
SODIUM: 139 mmol/L (ref 135–145)
Total Bilirubin: 0.8 mg/dL (ref 0.3–1.2)
Total Protein: 5.9 g/dL — ABNORMAL LOW (ref 6.5–8.1)

## 2018-01-19 MED ORDER — FAMOTIDINE IN NACL 20-0.9 MG/50ML-% IV SOLN
20.0000 mg | Freq: Once | INTRAVENOUS | Status: AC
  Administered 2018-01-19: 20 mg via INTRAVENOUS
  Filled 2018-01-19: qty 50

## 2018-01-19 MED ORDER — SODIUM CHLORIDE 0.9 % IV BOLUS
1000.0000 mL | Freq: Once | INTRAVENOUS | Status: AC
  Administered 2018-01-19: 1000 mL via INTRAVENOUS

## 2018-01-19 NOTE — ED Provider Notes (Signed)
[facility] EMERGENCY DEPARTMENT Provider Note   CSN: [id number] Arrival date & time: 01/19/18  2031     History   Chief Complaint Chief Complaint  Patient presents with  . Allergic Reaction    HPI Linda Black is a 47 y.o. female.  HPI   Patient is a 47 year old female presenting with anaphylaxis.  Patient ate some check for the last week and had a mild reaction.  Then today she ate some junk food and had a significant amount swelling.  EMS was called.  Patient was hypotensive, obtunded on arrival.  They gave her epi, Solu-Medrol, 50 mg of IV Benadryl.  Had small effect on patient.  They gave her another IM dose of epi.  Patient became more alert.  However on arrival patient still has diffuse rash, facial swelling.  Patient has normal respirations, no wheezing, stridor.  Patient is [place], does not speak [place].  Patient has family with her that are able to communicate with her.    Past Medical History:  Diagnosis Date  . Diabetes mellitus without complication ([place])   . Hypertension   . IgA nephropathy 2019  . Language barrier   . Obesity   . Proteinuria   . Renal disorder     Patient Active Problem List   Diagnosis Date Noted  . Tremor 01/05/2018  . Vitamin D deficiency 01/05/2018  . Abnormal thyroid blood test 01/05/2018  . RUQ pain 10/27/2017  . Hemorrhoids 10/27/2017  . Non-intractable vomiting with nausea 09/30/2017  . High risk medication use 09/25/2017  . Type 2 diabetes mellitus with neurological complications ([place]) 85/63/1497  . Proteinuria 09/02/2017  . Paresthesia 09/02/2017  . IgA nephropathy 06/10/2017  . Language barrier 06/10/2017  . Gastroesophageal reflux disease 06/10/2017  . SOB (shortness of breath) 02/24/2017  . Chest discomfort 02/24/2017  . Hyperlipidemia 04/21/2016  . Renal insufficiency 01/29/2016  . Bilateral low back pain without sciatica 01/29/2016  . Morbid obesity ([place]) 01/29/2016  . Hypokalemia  01/29/2016  . Essential hypertension 01/29/2016    Past Surgical History:  Procedure Laterality Date  . NO PAST SURGERIES  10/2017     OB History   None      Home Medications    Prior to Admission medications   Medication Sig Start Date End Date Taking? Authorizing Provider  amLODipine (NORVASC) 5 MG tablet Take 1 tablet (5 mg total) by mouth daily. 01/06/18   Tysinger, Camelia Eng, PA-C  cholecalciferol (VITAMIN D) 1000 units tablet Take 1 tablet (1,000 Units total) by mouth daily. 09/03/17   Tysinger, Camelia Eng, PA-C  cyclophosphamide (CYTOXAN) 50 MG capsule Take 100 mg by mouth daily. Give on an empty stomach 1 hour before or 2 hours after meals.    [provider]  furosemide (LASIX) 40 MG tablet Take 1 tablet (40 mg total) by mouth daily. 01/06/18   Tysinger, Camelia Eng, PA-C  gabapentin (NEURONTIN) 100 MG capsule Take 1 capsule (100 mg total) by mouth at bedtime. 01/06/18 01/06/19  Tysinger, Camelia Eng, PA-C  hydrocortisone (ANUSOL-HC) 2.5 % rectal cream Place 1 application rectally 2 (two) times daily. 10/27/17   Tysinger, Camelia Eng, PA-C  Insulin Glargine (BASAGLAR KWIKPEN) 100 UNIT/ML SOPN Inject 0.53 mLs (53 Units total) into the skin at bedtime. 01/06/18   Tysinger, Camelia Eng, PA-C  insulin glargine (LANTUS) 100 unit/mL SOPN Begin with 5 units at bedtime.   Can increase 2 units weekly until morning glucose consistenly <130 09/02/17   Tysinger, Camelia Eng,  PA-C  Insulin Pen Needle 32G X 4 MM MISC 1 each by Does not apply route at bedtime. 01/06/18   Tysinger, Camelia Eng, PA-C  levothyroxine (SYNTHROID) 50 MCG tablet Take 1 tablet (50 mcg total) by mouth daily. 01/06/18 01/06/19  Tysinger, Camelia Eng, PA-C  lisinopril (PRINIVIL,ZESTRIL) 40 MG tablet Take 1 tablet (40 mg total) by mouth daily. 01/06/18   Tysinger, Camelia Eng, PA-C  omeprazole (PRILOSEC) 40 MG capsule Take 1 capsule (40 mg total) by mouth daily. 09/02/17   Tysinger, Camelia Eng, PA-C  ondansetron (ZOFRAN) 4 MG tablet Take 1 tablet (4 mg total) by mouth  every 8 (eight) hours as needed for nausea or vomiting. 10/27/17   Tysinger, Camelia Eng, PA-C  potassium chloride (KLOR-CON 10) 10 MEQ tablet Take 1 tablet (10 mEq total) by mouth 2 (two) times daily. 09/03/17   Tysinger, Camelia Eng, PA-C  promethazine (PHENERGAN) 25 MG tablet 1/2/-1 tablet po q6hr prn nausea 10/27/17   Tysinger, Camelia Eng, PA-C  rosuvastatin (CRESTOR) 20 MG tablet Take 1 tablet (20 mg total) by mouth daily. 01/06/18   Tysinger, Camelia Eng, PA-C    Family History Family History  Problem Relation Age of Onset  . Diabetes Mother   . Hypertension Father     Social History Social History   Tobacco Use  . Smoking status: Never Smoker  . Smokeless tobacco: Never Used  Substance Use Topics  . Alcohol use: No  . Drug use: No     Allergies   Patient has no known allergies.   Review of Systems Review of Systems  Constitutional: Positive for activity change.  Respiratory: Negative for shortness of breath.   Skin: Positive for rash.     Physical Exam Updated Vital Signs BP (!) 142/109 (BP Location: Right Arm)   Pulse (!) 103   Temp (!) 97.4 F (36.3 C) (Temporal)   Resp 18   Ht 5' 0.5" (1.537 m)   Wt 80.7 kg (178 lb)   SpO2 100%   BMI 34.19 kg/m   Physical Exam  Constitutional: She is oriented to person, place, and time. She appears well-developed and well-nourished.  HENT:  Head: Normocephalic and atraumatic.  Mouth/Throat: Oropharynx is clear and moist.  No oral swelling, no tongue swelling  Diffuse facial swelling and erythema.  Eyes: Right eye exhibits no discharge. Left eye exhibits no discharge.  Cardiovascular: Normal rate and regular rhythm.  Pulmonary/Chest: Effort normal and breath sounds normal.  Abdominal: Soft. She exhibits no distension. There is no tenderness.  Neurological: She is oriented to person, place, and time.  Skin: Skin is warm and dry. She is not diaphoretic.  Diffuse uticarial rash, all over chest arms, legs, face.   Psychiatric: She  has a normal mood and affect.  Nursing note and vitals reviewed.    ED Treatments / Results  Labs (all labs ordered are listed, but only abnormal results are displayed) Labs Reviewed  COMPREHENSIVE METABOLIC PANEL  CBC WITH DIFFERENTIAL/PLATELET    EKG None  Radiology No results found.  Procedures Procedures (including critical care time)  CRITICAL CARE Performed by: Gardiner Sleeper Total critical care time: 60 minutes Critical care time was exclusive of separately billable procedures and treating other patients. Critical care was necessary to treat or prevent imminent or life-threatening deterioration. Critical care was time spent personally by me on the following activities: development of treatment plan with patient and/or surrogate as well as nursing, discussions with consultants, evaluation of patient's response to treatment, examination  of patient, obtaining history from patient or surrogate, ordering and performing treatments and interventions, ordering and review of laboratory studies, ordering and review of radiographic studies, pulse oximetry and re-evaluation of patient's condition.   Medications Ordered in ED Medications - No data to display   Initial Impression / Assessment and Plan / ED Course  I have reviewed the triage vital signs and the nursing notes.  Pertinent labs & imaging results that were available during my care of the patient were reviewed by me and considered in my medical decision making (see chart for details).     Patient is a 47 year old female presenting with anaphylaxis.  Patient ate some check for the last week and had a mild reaction.  Then today she ate some junk food and had a significant amount swelling.  EMS was called.  Patient was hypotensive, obtunded on arrival.  They gave her epi, Solu-Medrol, 50 mg of IV Benadryl.  Had small effect on patient.  They gave her another IM dose of epi.  Patient became more alert.  However on  arrival patient still has diffuse rash, facial swelling.  Patient has normal respirations, no wheezing, stridor.  Patient is [place], does not speak [place].  Patient has family with her that are able to communicate with her.  8:56 PM Concerning because patient has received 2 of IM epi with only little improvement of symptoms.  Patient has still diffuse swelling, erythema, [place] area despite multiple doses of IM epi.  Considering starting epinephrine drip.  Patient currently with reassuring vital signs.  Will touch base with critical care to get their input.   10:30 PM Critical care is okay with her going to the floor.  They are aware of her presence.    Patient is looking improved now.  Of note patient has a worsening AKI.  Will give fluids.  Will admit for fluids, observation given severity of reaction and worsening despite epi.    . Final Clinical Impressions(s) / ED Diagnoses   Final diagnoses:  None    ED Discharge Orders    None       Dermot Gremillion, Fredia Sorrow, MD 01/19/18 2231

## 2018-01-19 NOTE — Consult Note (Signed)
.. ..  Name: Linda Black MRN: [id number] DOB: [birth year]    ADMISSION DATE:  01/19/2018 CONSULTATION DATE:  01/19/18  REFERRING MD :  Thomasene Lot MD- ER PROVIDER  CHIEF COMPLAINT:  Allergic reaction  BRIEF PATIENT DESCRIPTION: 47 yr old female with PMHx sig for HTN p/w lip swelling, diffuse rash, hypotension after eating jackfruit. She also took her home meds which include lisinopril. PCCM asked to evaluate  SIGNIFICANT EVENTS  Anaphylactic reaction   HISTORY OF PRESENT ILLNESS: ( history obtained from EMR, other providers accounts and the accounts of family sitting at bedside) 47 yr old obese female with PMHx Hypertension (on several medications including Lisinopril), DM, IgA nephropathy presents to  [facility] per triage she has a minor reaction last week. ( per her son she also had jackfruit and took all of her meds at that time). Tonight the allergic reaction was more severe following ingestion of jackfruit.  On arrival patient has diffuse rash, facial swelling  Per triage and EDP documentation:  Initial vitals: 64 systolic pt was obtunded. She received Epi IM x 2, solumedrol and benadryl  Post medical mgmt Systolic 707E.  pt AAOx4  PAST MEDICAL HISTORY :   has a past medical history of Diabetes mellitus without complication ([place]), Hypertension, IgA nephropathy (2019), Language barrier, Obesity, Proteinuria, and Renal disorder.  has a past surgical history that includes No past surgeries (10/2017). Prior to Admission medications   Medication Sig Start Date End Date Taking? Authorizing Provider  amLODipine (NORVASC) 5 MG tablet Take 1 tablet (5 mg total) by mouth daily. 01/06/18  Yes Tysinger, Camelia Eng, PA-C  cholecalciferol (VITAMIN D) 1000 units tablet Take 1 tablet (1,000 Units total) by mouth daily. 09/03/17  Yes Tysinger, Camelia Eng, PA-C  cyclophosphamide (CYTOXAN) 50 MG capsule Take 100 mg by mouth daily. Give on an empty stomach 1 hour before or 2 hours after meals.   Yes [provider]  furosemide (LASIX) 40 MG tablet Take 1 tablet (40 mg total) by mouth daily. 01/06/18  Yes Tysinger, Camelia Eng, PA-C  gabapentin (NEURONTIN) 100 MG capsule Take 1 capsule (100 mg total) by mouth at bedtime. 01/06/18 01/06/19 Yes Tysinger, Camelia Eng, PA-C  Insulin Glargine (BASAGLAR KWIKPEN) 100 UNIT/ML SOPN Inject 0.53 mLs (53 Units total) into the skin at bedtime. 01/06/18  Yes Tysinger, Camelia Eng, PA-C  insulin glargine (LANTUS) 100 unit/mL SOPN Begin with 5 units at bedtime.   Can increase 2 units weekly until morning glucose consistenly <130 09/02/17  Yes Tysinger, Camelia Eng, PA-C  levothyroxine (SYNTHROID) 50 MCG tablet Take 1 tablet (50 mcg total) by mouth daily. 01/06/18 01/06/19 Yes Tysinger, Camelia Eng, PA-C  lisinopril (PRINIVIL,ZESTRIL) 40 MG tablet Take 1 tablet (40 mg total) by mouth daily. 01/06/18  Yes Tysinger, Camelia Eng, PA-C  omeprazole (PRILOSEC) 40 MG capsule Take 1 capsule (40 mg total) by mouth daily. 09/02/17  Yes Tysinger, Camelia Eng, PA-C  potassium chloride (KLOR-CON 10) 10 MEQ tablet Take 1 tablet (10 mEq total) by mouth 2 (two) times daily. 09/03/17  Yes Tysinger, Camelia Eng, PA-C  promethazine (PHENERGAN) 25 MG tablet 1/2/-1 tablet po q6hr prn nausea Patient taking differently: Take 12.5-25 mg by mouth every 6 (six) hours as needed for nausea.  10/27/17  Yes Tysinger, Camelia Eng, PA-C  rosuvastatin (CRESTOR) 20 MG tablet Take 1 tablet (20 mg total) by mouth daily. Patient taking differently: Take 40 mg by mouth daily.  01/06/18  Yes Tysinger, Camelia Eng, PA-C  hydrocortisone (ANUSOL-HC) 2.5 % rectal  cream Place 1 application rectally 2 (two) times daily. Patient not taking: Reported on 01/19/2018 10/27/17   Tysinger, Camelia Eng, PA-C  Insulin Pen Needle 32G X 4 MM MISC 1 each by Does not apply route at bedtime. 01/06/18   Tysinger, Camelia Eng, PA-C  ondansetron (ZOFRAN) 4 MG tablet Take 1 tablet (4 mg total) by mouth every 8 (eight) hours as needed for nausea or vomiting. Patient not taking: Reported on 01/19/2018 10/27/17    Tysinger, Camelia Eng, PA-C   No Known Allergies  FAMILY HISTORY:  family history includes Diabetes in her mother; Hypertension in her father. SOCIAL HISTORY:  reports that she has never smoked. She has never used smokeless tobacco. She reports that she does not drink alcohol or use drugs.  REVIEW OF SYSTEMS:   Constitutional: Negative for fever, chills, weight loss, malaise/fatigue and diaphoresis.  HENT: Negative for hearing loss, ear pain, nosebleeds, congestion, sore throat, neck pain, tinnitus and ear discharge.   Eyes: Negative for blurred vision, double vision, photophobia, pain, discharge and redness.  Respiratory: Negative for cough, hemoptysis, sputum production, shortness of breath, negative for wheezing and stridor.   Cardiovascular: Negative for chest pain, palpitations, orthopnea, claudication, leg swelling and PND.  Gastrointestinal: Negative for heartburn, nausea, vomiting, abdominal pain, diarrhea, constipation, blood in stool and melena.  Genitourinary: Negative for dysuria, urgency, frequency, hematuria and flank pain.  Musculoskeletal: Negative for myalgias, back pain, joint pain and falls.  Skin: itching and rash.  Neurological: Negative for dizziness, tingling, tremors, sensory change, speech change, focal weakness, seizures, loss of consciousness, weakness and headaches.  Endo/Heme/Allergies: Negative for environmental allergies and polydipsia. Does not bruise/bleed easily.  SUBJECTIVE:   VITAL SIGNS: Temp:  [97.4 F (36.3 C)] 97.4 F (36.3 C) (06/18 2047) Pulse Rate:  [74-103] 80 (06/18 2245) Resp:  [18-32] 25 (06/18 2245) BP: (101-142)/(56-109) 101/65 (06/18 2245) SpO2:  [97 %-100 %] 97 % (06/18 2245) Weight:  [80.7 kg (178 lb)] 80.7 kg (178 lb) (06/18 2041)  PHYSICAL EXAMINATION: General:  Obese female in no acute distress Neuro:  Alert , awake and oriented. No focal deficits. No weakness HEENT:  PERRLA, nCAT, tongue pink not swollen, oral mucosa not  ulcerated or erythematous. Mallampati III Cardiovascular:  RRR S1 and S2 appreciated Lungs:  CTAB no wheezing or stridor Abdomen:  Soft obese non distended + BS in all 4 quadrants Musculoskeletal:  No gross deformities. Intact strength Skin:  Diffuse maculopapular rash erythematous seen on trunk and extremities not on palms or soles  Recent Labs  Lab 01/19/18 2127  NA 139  K 3.3*  CL 105  CO2 22  BUN 35*  CREATININE 2.67*  GLUCOSE 248*   Recent Labs  Lab 01/19/18 2127  HGB 11.7*  HCT 35.5*  WBC 9.3  PLT 254   No results found.  ASSESSMENT / PLAN: 1. Anaphylaxis- secondary to jackfruit vs ACE inhibitor: per my evaluation pt is hemodynamically stable, protecting her airway, no stridor or wheezing on exam, speaking in full sentences no drooling, able to swallow easily and denies any respiratory difficulty. At this time pt does not require intubation or critical care monitoring.  We discussed possible causes of anaphylaxis including jack fruit and lisinopril and recommended switching to an ARB and avoiding jack fruit. Pt will need an epi pen prior to discharge.   2. Hyperglycemia- h/o DM: while in patient get Hgb A1c and start on SSI. Npo for now  3. AKI on CKD- pt h/o Ig A nephropathy, DM, and  on Ace inhibitor. Monitor UOP avoid nephrotoxic medications.   4. Hypokalemia- on Lasix: replete potassium with goal K+ = 4.    I, Dr Seward Carol have personally reviewed patient's available data, including medical history, events of note, physical examination and test results as part of my evaluation. I have discussed with NP and other care providers such as RN and EDP The patient is not critically ill and at this time does not require high complexity decision making for assessment and support, frequent evaluation and titration of therapies, application of advanced monitoring technologies and extensive interpretation of multiple databases. Pt may be admitted to medicine.   Critical  Care Consult Time devoted to patient care services described in this note is 56 Minutes.  This time reflects time of care of this signee Dr Seward Carol. This critical care time does not reflect procedure time, or teaching time or supervisory time of NP Hoffman but could involve care discussion time    DISPOSITION: Admit to Medicine CC TIME: 47 minutes PROGNOSIS: Guarded FAMILY: Children at bedside all [place] speaking. Updated them on medical condition. Discussed with them and patient regarding avoiding possible triggers of allergic rxn including Jackfruit and Lisinopril.   Signed Dr Seward Carol Pulmonary Critical Care Locums  01/19/2018, 11:48 PM

## 2018-01-19 NOTE — ED Triage Notes (Signed)
Pt BIB [facility] for eval of allergic reaction. Pt ingested jackfruit at approx 1915 this evening. She had a minor reaction last week, but tonight experienced anaphylactic reaction requring 0.3mg  epi x2 IM, 125mg  solumedrol, 50 mg IV benadryl. Pt was initially hypotensive to 64 systolic, improved to 239R on arrival. Pt is unable to provide hx d/t language barrier and she is unable to specify which language she speaks. Pt appears reddened, tremulous. Airway patent, LS CTAB,

## 2018-01-20 ENCOUNTER — Encounter (HOSPITAL_COMMUNITY): Payer: Self-pay | Admitting: Internal Medicine

## 2018-01-20 DIAGNOSIS — N028 Recurrent and persistent hematuria with other morphologic changes: Secondary | ICD-10-CM

## 2018-01-20 DIAGNOSIS — N183 Chronic kidney disease, stage 3 (moderate): Secondary | ICD-10-CM

## 2018-01-20 DIAGNOSIS — N189 Chronic kidney disease, unspecified: Secondary | ICD-10-CM

## 2018-01-20 DIAGNOSIS — D649 Anemia, unspecified: Secondary | ICD-10-CM | POA: Diagnosis present

## 2018-01-20 DIAGNOSIS — N179 Acute kidney failure, unspecified: Secondary | ICD-10-CM

## 2018-01-20 DIAGNOSIS — T782XXS Anaphylactic shock, unspecified, sequela: Secondary | ICD-10-CM | POA: Diagnosis not present

## 2018-01-20 DIAGNOSIS — E119 Type 2 diabetes mellitus without complications: Secondary | ICD-10-CM | POA: Diagnosis not present

## 2018-01-20 DIAGNOSIS — I10 Essential (primary) hypertension: Secondary | ICD-10-CM | POA: Diagnosis not present

## 2018-01-20 DIAGNOSIS — T782XXA Anaphylactic shock, unspecified, initial encounter: Secondary | ICD-10-CM | POA: Diagnosis not present

## 2018-01-20 LAB — CBC WITH DIFFERENTIAL/PLATELET
Abs Immature Granulocytes: 0 10*3/uL (ref 0.0–0.1)
Basophils Absolute: 0 10*3/uL (ref 0.0–0.1)
Basophils Relative: 0 %
Eosinophils Absolute: 0 10*3/uL (ref 0.0–0.7)
Eosinophils Relative: 0 %
HCT: 33.5 % — ABNORMAL LOW (ref 36.0–46.0)
Hemoglobin: 11.1 g/dL — ABNORMAL LOW (ref 12.0–15.0)
Immature Granulocytes: 0 %
Lymphocytes Relative: 4 %
Lymphs Abs: 0.3 10*3/uL — ABNORMAL LOW (ref 0.7–4.0)
MCH: 31.4 pg (ref 26.0–34.0)
MCHC: 33.1 g/dL (ref 30.0–36.0)
MCV: 94.9 fL (ref 78.0–100.0)
Monocytes Absolute: 0.1 10*3/uL (ref 0.1–1.0)
Monocytes Relative: 1 %
Neutro Abs: 7.4 10*3/uL (ref 1.7–7.7)
Neutrophils Relative %: 95 %
Platelets: 211 10*3/uL (ref 150–400)
RBC: 3.53 MIL/uL — ABNORMAL LOW (ref 3.87–5.11)
RDW: 12.1 % (ref 11.5–15.5)
WBC: 7.9 10*3/uL (ref 4.0–10.5)

## 2018-01-20 LAB — RETICULOCYTES
RBC.: 3.47 MIL/uL — AB (ref 3.87–5.11)
RETIC CT PCT: 3 % (ref 0.4–3.1)
Retic Count, Absolute: 104.1 10*3/uL (ref 19.0–186.0)

## 2018-01-20 LAB — BASIC METABOLIC PANEL
ANION GAP: 9 (ref 5–15)
BUN: 33 mg/dL — AB (ref 6–20)
CHLORIDE: 108 mmol/L (ref 101–111)
CO2: 22 mmol/L (ref 22–32)
Calcium: 8.7 mg/dL — ABNORMAL LOW (ref 8.9–10.3)
Creatinine, Ser: 2.31 mg/dL — ABNORMAL HIGH (ref 0.44–1.00)
GFR calc Af Amer: 28 mL/min — ABNORMAL LOW (ref >60)
GFR calc non Af Amer: 24 mL/min — ABNORMAL LOW (ref >60)
GLUCOSE: 205 mg/dL — AB (ref 65–99)
POTASSIUM: 4.4 mmol/L (ref 3.5–5.1)
Sodium: 139 mmol/L (ref 135–145)

## 2018-01-20 LAB — IRON AND TIBC
Iron: 65 ug/dL (ref 28–170)
SATURATION RATIOS: 19 % (ref 10.4–31.8)
TIBC: 347 ug/dL (ref 250–450)
UIBC: 282 ug/dL

## 2018-01-20 LAB — FERRITIN: FERRITIN: 122 ng/mL (ref 11–307)

## 2018-01-20 LAB — GLUCOSE, CAPILLARY
GLUCOSE-CAPILLARY: 138 mg/dL — AB (ref 65–99)
GLUCOSE-CAPILLARY: 166 mg/dL — AB (ref 65–99)
Glucose-Capillary: 185 mg/dL — ABNORMAL HIGH (ref 65–99)
Glucose-Capillary: 115 mg/dL — ABNORMAL HIGH (ref 65–99)
Glucose-Capillary: 208 mg/dL — ABNORMAL HIGH (ref 65–99)

## 2018-01-20 LAB — LIPID PANEL
CHOLESTEROL: 145 mg/dL (ref 0–200)
HDL: 56 mg/dL (ref >40)
LDL CALC: 73 mg/dL (ref 0–99)
TRIGLYCERIDES: 78 mg/dL (ref <150)
Total CHOL/HDL Ratio: 2.6 RATIO
VLDL: 16 mg/dL (ref 0–40)

## 2018-01-20 LAB — MAGNESIUM: Magnesium: 2.1 mg/dL (ref 1.7–2.4)

## 2018-01-20 LAB — MRSA PCR SCREENING: MRSA by PCR: NEGATIVE

## 2018-01-20 LAB — VITAMIN B12: VITAMIN B 12: 335 pg/mL (ref 180–914)

## 2018-01-20 LAB — TYPE AND SCREEN
ABO/RH(D): A POS
Antibody Screen: NEGATIVE

## 2018-01-20 LAB — FOLATE: Folate: 6.5 ng/mL (ref >5.9)

## 2018-01-20 LAB — ABO/RH: ABO/RH(D): A POS

## 2018-01-20 MED ORDER — CYCLOPHOSPHAMIDE 50 MG PO CAPS
100.0000 mg | ORAL_CAPSULE | Freq: Every day | ORAL | Status: DC
  Administered 2018-01-20 – 2018-01-22 (×3): 100 mg via ORAL
  Filled 2018-01-20 (×3): qty 2

## 2018-01-20 MED ORDER — HYDRALAZINE HCL 20 MG/ML IJ SOLN: 10.0000 mg | INTRAMUSCULAR | Status: DC | PRN

## 2018-01-20 MED ORDER — GABAPENTIN 100 MG PO CAPS
100.0000 mg | ORAL_CAPSULE | Freq: Every day | ORAL | Status: DC
  Administered 2018-01-20 – 2018-01-21 (×2): 100 mg via ORAL
  Filled 2018-01-20 (×2): qty 1

## 2018-01-20 MED ORDER — METHYLPREDNISOLONE SODIUM SUCC 40 MG IJ SOLR
40.0000 mg | Freq: Two times a day (BID) | INTRAMUSCULAR | Status: DC
  Administered 2018-01-20 – 2018-01-21 (×4): 40 mg via INTRAVENOUS
  Filled 2018-01-20 (×4): qty 1

## 2018-01-20 MED ORDER — LEVOTHYROXINE SODIUM 50 MCG PO TABS
50.0000 ug | ORAL_TABLET | Freq: Every day | ORAL | Status: DC
  Administered 2018-01-20 – 2018-01-22 (×3): 50 ug via ORAL
  Filled 2018-01-20 (×3): qty 1

## 2018-01-20 MED ORDER — FAMOTIDINE IN NACL 20-0.9 MG/50ML-% IV SOLN
20.0000 mg | INTRAVENOUS | Status: DC
  Administered 2018-01-21: 20 mg via INTRAVENOUS
  Filled 2018-01-20: qty 50

## 2018-01-20 MED ORDER — INSULIN ASPART 100 UNIT/ML ~~LOC~~ SOLN
0.0000 [IU] | Freq: Three times a day (TID) | SUBCUTANEOUS | Status: DC
  Administered 2018-01-20: 1 [IU] via SUBCUTANEOUS
  Administered 2018-01-20: 2 [IU] via SUBCUTANEOUS
  Administered 2018-01-21: 1 [IU] via SUBCUTANEOUS
  Administered 2018-01-21: 3 [IU] via SUBCUTANEOUS
  Administered 2018-01-21: 2 [IU] via SUBCUTANEOUS

## 2018-01-20 MED ORDER — ACETAMINOPHEN 325 MG PO TABS
650.0000 mg | ORAL_TABLET | Freq: Four times a day (QID) | ORAL | Status: DC | PRN
  Filled 2018-01-20: qty 2

## 2018-01-20 MED ORDER — ROSUVASTATIN CALCIUM 40 MG PO TABS
40.0000 mg | ORAL_TABLET | Freq: Every day | ORAL | Status: DC
  Administered 2018-01-20 – 2018-01-22 (×3): 40 mg via ORAL
  Filled 2018-01-20 (×2): qty 1
  Filled 2018-01-20: qty 4
  Filled 2018-01-20: qty 1
  Filled 2018-01-20: qty 4

## 2018-01-20 MED ORDER — DIPHENHYDRAMINE HCL 25 MG PO CAPS
25.0000 mg | ORAL_CAPSULE | Freq: Two times a day (BID) | ORAL | Status: DC
  Administered 2018-01-20 – 2018-01-22 (×5): 25 mg via ORAL
  Filled 2018-01-20 (×5): qty 1

## 2018-01-20 MED ORDER — SODIUM CHLORIDE 0.9 % IV SOLN
INTRAVENOUS | Status: AC
  Administered 2018-01-20: 02:00:00 via INTRAVENOUS

## 2018-01-20 MED ORDER — ACETAMINOPHEN 650 MG RE SUPP: 650.0000 mg | Freq: Four times a day (QID) | RECTAL | Status: DC | PRN

## 2018-01-20 MED ORDER — INSULIN GLARGINE 100 UNIT/ML ~~LOC~~ SOLN
5.0000 [IU] | Freq: Every day | SUBCUTANEOUS | Status: DC
  Filled 2018-01-20 (×2): qty 0.05

## 2018-01-20 MED ORDER — AMLODIPINE BESYLATE 5 MG PO TABS
5.0000 mg | ORAL_TABLET | Freq: Every day | ORAL | Status: DC
  Administered 2018-01-20 – 2018-01-22 (×3): 5 mg via ORAL
  Filled 2018-01-20 (×3): qty 1

## 2018-01-20 MED ORDER — FAMOTIDINE IN NACL 20-0.9 MG/50ML-% IV SOLN
20.0000 mg | Freq: Two times a day (BID) | INTRAVENOUS | Status: DC
  Administered 2018-01-20: 20 mg via INTRAVENOUS
  Filled 2018-01-20: qty 50

## 2018-01-20 NOTE — Progress Notes (Signed)
PROGRESS NOTE    Linda Black  CLE:[id number] DOB: 1971/01/17 DOA: 01/19/2018 PCP: Carlena Hurl, PA-C   Brief Narrative:   47 y.o. AF (primary language [place]) PMHx  IgA nephropathy on Cytoxan and lisinopril, DM Type 2 on Lantus, HTN, Chronic Anemia   Brought to the ER after patient had swelling of the face and rash diffusely all over the body.  Patient states that 3 weeks ago when she had Barnabas Lister fluid she had a similar reaction but to a lesser extent.  Last evening she had diffuse rash with facial swelling and she also briefly passed out.  EMS was called and patient was brought to the ER.  As per the daughter patient also was recently started on lisinopril 3 weeks ago.  Patient denies any difficulty breathing and did not have any tongue swelling.   ED Course: Patient was given 2 doses of epinephrine and Solu-Medrol Pepcid following which patient symptoms improved.  Critical care was consulted and at this time since patient symptoms are improving patient is admitted to hospitalist service.  On my exam patient is not short of breath swelling is improved as per the patient's daughter was at the bedside but still has some swelling around the face but able to talk without difficulty no stridor no tongue swelling no wheezing has mild rash on all extremities.      Subjective: 6/19 A/O x4, SOB, negative CP, negative abdominal pain states yesterday had heavy pain in her chest, could not open her eyes, difficulty talking.  But has resolved.  Some continued mild tongue swelling but no difficulty with breathing or drinking.   Assessment & Plan:   Principal Problem:   Anaphylaxis Active Problems:   Essential hypertension   Type 2 diabetes mellitus with neurological complications (HCC)   Renal failure (ARF), acute on chronic (HCC)   Normocytic normochromic anemia      Anaphylactic reaction - Likely to lisinopril or Barnabas Lister fruit -Continue Pepcid 20 mg BID - Solu-Medrol 40 mg  BID -Benadryl 25 mg BID  Acute on CKD stage III IgA nephropathy -Followed by Dr. Moshe Cipro nephrologist - On Cytotoxin mg daily - Lisinopril discontinued secondary to anaphylactic reaction - Hold all nephrotoxic medication Recent Labs  Lab 01/19/18 2127 01/20/18 0158  CREATININE 2.67* 2.31*  -Gently hydrate normal saline 17ml/hr  Essential HTN -Amlodipine 5 mg daily - Hydralazine PRN    Diabetes type 2 controlled without complication -9/44 hemoglobin A1c-6.2 -Lantus 5 daily - Sensitive SSI -We will notify nephrology that we have discontinued lisinopril patient hospitalized  HLD - Lipid panel not within ADA guidelines.  Repeat lipid panel pending - Crestor 40 mg daily for now  Anemia -Likely from renal disease - Anemia panel pending  Hypothyroidism -Synthroid 50 mcg daily      DVT prophylaxis: SCD Code Status: Full Family Communication: Daughter at bedside for discussion plan of care and to act as translator Disposition Plan: Discharge next 24 to 48 hours   Consultants:  PCCM     Procedures/Significant Events:     I have personally reviewed and interpreted all radiology studies and my findings are as above.  VENTILATOR SETTINGS:    Cultures   Antimicrobials: Anti-infectives (From admission, onward)   None       Devices    LINES / TUBES:      Continuous Infusions: . sodium chloride 75 mL/hr at 01/20/18 0141  . famotidine (PEPCID) IV 20 mg (01/20/18 9675)     Objective: Vitals:  01/19/18 2245 01/20/18 0017 01/20/18 0436 01/20/18 0725  BP: 101/65 140/83 125/76 131/79  Pulse: 80 77  71  Resp: (!) 25 20  20   Temp:  98.4 F (36.9 C) 98.6 F (37 C) 97.8 F (36.6 C)  TempSrc:  Oral Oral Oral  SpO2: 97% 99% 96% 97%  Weight:  192 lb 3.9 oz (87.2 kg)    Height:  5' (1.524 m)      Intake/Output Summary (Last 24 hours) at 01/20/2018 4132 Last data filed at 01/20/2018 0700 Gross per 24 hour  Intake 1746.74 ml  Output 900 ml   Net 846.74 ml   Filed Weights   01/19/18 2041 01/20/18 0017  Weight: 178 lb (80.7 kg) 192 lb 3.9 oz (87.2 kg)    Examination:  General: A/O x4 No acute respiratory distress Eyes: negative scleral hemorrhage, negative anisocoria, negative icterus ENT: Negative Runny nose, negative gingival bleeding, mild tongue swelling, positive Cheilosis Neck:  Negative scars, masses, torticollis, lymphadenopathy, JVD Lungs: Clear to auscultation bilaterally without wheezes or crackles Cardiovascular: Regular rate and rhythm without murmur gallop or rub normal S1 and S2 Abdomen: negative abdominal pain, nondistended, positive soft, bowel sounds, no rebound, no ascites, no appreciable mass Extremities: No significant cyanosis, clubbing, or edema bilateral lower extremities Skin: Mild erythema on face and upper chest but per daughter baseline  Psychiatric:  Negative depression, negative anxiety, negative fatigue, negative mania  Central nervous system:  Cranial nerves II through XII intact, tongue/uvula midline, all extremities muscle strength 5/5, sensation intact throughout, negative dysarthria, negative expressive aphasia, negative receptive aphasia.  .     Data Reviewed: Care during the described time interval was provided by me .  I have reviewed this patient's available data, including medical history, events of note, physical examination, and all test results as part of my evaluation.   CBC: Recent Labs  Lab 01/19/18 2127 01/20/18 0158  WBC 9.3 7.9  NEUTROABS 5.7 7.4  HGB 11.7* 11.1*  HCT 35.5* 33.5*  MCV 95.2 94.9  PLT 254 440   Basic Metabolic Panel: Recent Labs  Lab 01/19/18 2127 01/20/18 0158  NA 139 139  K 3.3* 4.4  CL 105 108  CO2 22 22  GLUCOSE 248* 205*  BUN 35* 33*  CREATININE 2.67* 2.31*  CALCIUM 8.4* 8.7*  MG  --  2.1   GFR: Estimated Creatinine Clearance: 29.9 mL/min (A) (by C-G formula based on SCr of 2.31 mg/dL (H)). Liver Function Tests: Recent Labs   Lab 01/19/18 2127  AST 25  ALT 29  ALKPHOS 54  BILITOT 0.8  PROT 5.9*  ALBUMIN 3.3*   No results for input(s): LIPASE, AMYLASE in the last 168 hours. No results for input(s): AMMONIA in the last 168 hours. Coagulation Profile: No results for input(s): INR, PROTIME in the last 168 hours. Cardiac Enzymes: No results for input(s): CKTOTAL, CKMB, CKMBINDEX, TROPONINI in the last 168 hours. BNP (last 3 results) No results for input(s): PROBNP in the last 8760 hours. HbA1C: No results for input(s): HGBA1C in the last 72 hours. CBG: Recent Labs  Lab 01/20/18 0111 01/20/18 0729  GLUCAP 185* 138*   Lipid Profile: No results for input(s): CHOL, HDL, LDLCALC, TRIG, CHOLHDL, LDLDIRECT in the last 72 hours. Thyroid Function Tests: No results for input(s): TSH, T4TOTAL, FREET4, T3FREE, THYROIDAB in the last 72 hours. Anemia Panel: No results for input(s): VITAMINB12, FOLATE, FERRITIN, TIBC, IRON, RETICCTPCT in the last 72 hours. Urine analysis:    Component Value Date/Time   COLORURINE  STRAW (A) 11/18/2015 1314   APPEARANCEUR CLEAR 11/18/2015 1314   LABSPEC neg 06/10/2017 1129   PHURINE 6.5 11/18/2015 1314   GLUCOSEU NEGATIVE 11/18/2015 1314   HGBUR SMALL (A) 11/18/2015 1314   BILIRUBINUR negative 06/10/2017 1129   BILIRUBINUR n 04/21/2016 1315   KETONESUR negative 06/10/2017 1129   KETONESUR NEGATIVE 11/18/2015 1314   PROTEINUR negative 06/10/2017 1129   PROTEINUR 1+ 04/21/2016 1315   PROTEINUR 100 (A) 11/18/2015 1314   UROBILINOGEN negative 04/21/2016 1315   UROBILINOGEN 0.2 03/18/2014 1558   NITRITE Negative 06/10/2017 1129   NITRITE n 04/21/2016 1315   NITRITE NEGATIVE 11/18/2015 1314   LEUKOCYTESUR Negative 06/10/2017 1129   Sepsis Labs: @LABRCNTIP (procalcitonin:4,lacticidven:4)  ) Recent Results (from the past 240 hour(s))  MRSA PCR Screening     Status: None   Collection Time: 01/20/18 12:41 AM  Result Value Ref Range Status   MRSA by PCR NEGATIVE NEGATIVE  Final    Comment:        The GeneXpert MRSA Assay (FDA approved for NASAL specimens only), is one component of a comprehensive MRSA colonization surveillance program. It is not intended to diagnose MRSA infection nor to guide or monitor treatment for MRSA infections. Performed at [facility], [place] [address] [place], [place] [address]          Radiology Studies: No results found.      Scheduled Meds: . amLODipine  5 mg Oral Daily  . cyclophosphamide  100 mg Oral Daily  . gabapentin  100 mg Oral QHS  . insulin aspart  0-9 Units Subcutaneous TID WC  . insulin glargine  5 Units Subcutaneous QHS  . levothyroxine  50 mcg Oral QAC breakfast  . methylPREDNISolone (SOLU-MEDROL) injection  40 mg Intravenous Q12H  . rosuvastatin  40 mg Oral Daily   Continuous Infusions: . sodium chloride 75 mL/hr at 01/20/18 0141  . famotidine (PEPCID) IV 20 mg (01/20/18 0635)     LOS: 0 days    Time spent: 40 minutes    Kendra Woolford, Geraldo Docker, MD [facility] Pager [phone]   If 7PM-7AM, please contact night-coverage [email or web address] Password [email or web address] 01/20/2018, 8:12 AM

## 2018-01-20 NOTE — Progress Notes (Signed)
.. ..    Name: Linda Black MRN: [id number] DOB: Dec 06, 1970    ADMISSION DATE:  01/19/2018 CONSULTATION DATE:  01/19/18  REFERRING MD :  Thomasene Lot MD- ER PROVIDER  CHIEF COMPLAINT:  Allergic reaction  BRIEF PATIENT DESCRIPTION: 47 yr old female with PMHx of HTN on ACE-I, IgA nephropathy who presented 6/18 with acute onset lip swelling, diffuse rash, hypotension after eating jackfruit. She also took her home meds which include lisinopril.  On arrival she was hypotensive and obtunded.  Treated with EPI, solumedrol and benadryl with significant improvement.  PCCM asked to evaluate   SUBJECTIVE:  Daughter and patient report significant improvement in swelling and near resolution of rash.  Pt denies SOB, difficulty swallowing.  Reports she still feels "tight" all over.  She reports she was so swollen she couldn't see out of her eyes but is much better.   VITAL SIGNS: Temp:  [97.4 F (36.3 C)-98.6 F (37 C)] 97.8 F (36.6 C) (06/19 0725) Pulse Rate:  [71-103] 71 (06/19 0725) Resp:  [18-32] 20 (06/19 0725) BP: (101-142)/(56-109) 131/79 (06/19 0725) SpO2:  [96 %-100 %] 97 % (06/19 0725) Weight:  [178 lb (80.7 kg)-192 lb 3.9 oz (87.2 kg)] 192 lb 3.9 oz (87.2 kg) (06/19 0017)  PHYSICAL EXAMINATION: General: adult female in NAD HEENT: MM pink/moist, short neck, ruddy face (daughter reports improved / baseline) Neuro: AAOx4, speech clear, MAE CV: s1s2 rrr, no m/r/g PULM: even/non-labored, lungs bilaterally clear with good air movement  TM:LYYT, non-tender, bsx4 active  Extremities: warm/dry, no edema  Skin: no rashes or lesions   Recent Labs  Lab 01/19/18 2127 01/20/18 0158  NA 139 139  K 3.3* 4.4  CL 105 108  CO2 22 22  BUN 35* 33*  CREATININE 2.67* 2.31*  GLUCOSE 248* 205*   Recent Labs  Lab 01/19/18 2127 01/20/18 0158  HGB 11.7* 11.1*  HCT 35.5* 33.5*  WBC 9.3 7.9  PLT 254 211   No results found.  ASSESSMENT / PLAN:  1. Anaphylaxis - secondary to jackfruit vs ACE  inhibitor (or both), significantly improved with steroids, epi, benadryl.  No respiratory distress / compromise.    Plan: Will need epi pen prior to discharge No further ACE-I or jack fruit Monitor another 24 hours in hospital  Consider outpatient food allergy testing   2. Hyperglycemia- h/o DM  Plan: Per primary   3. AKI on CKD- pt h/o Ig A nephropathy, DM, and on Ace inhibitor.  Plan: Per primary   4. Hypokalemia - on Lasix  Plan: Per primary    Reviewed with Dr. Sherral Hammers at bedside.  PCCM will be available PRN.  Please call back if new needs arise.    Noe Gens, NP-C [facility] Pgr: [phone] or if no answer [phone] 01/20/2018, 11:51 AM

## 2018-01-20 NOTE — H&P (Signed)
History and Physical    Linda Black YNW:[id number] DOB: [birth year] DOA: 01/19/2018  PCP: Carlena Hurl, PA-C   Patient coming from: Home.  History obtained through patient's daughter as interpreter.  Chief Complaint: Facial swelling and skin rash.  HPI: Linda Black is a 47 y.o. female with history of IgA nephropathy on Cytoxan and lisinopril, diabetes mellitus type 2 on Lantus, hypertension, chronic anemia was brought to the ER after patient had swelling of the face and rash diffusely all over the body.  Patient states that 3 weeks ago when she had Linda Black fluid she had a similar reaction but to a lesser extent.  Last evening she had diffuse rash with facial swelling and she also briefly passed out.  EMS was called and patient was brought to the ER.  As per the daughter patient also was recently started on lisinopril 3 weeks ago.  Patient denies any difficulty breathing and did not have any tongue swelling.  ED Course: Patient was given 2 doses of epinephrine and Solu-Medrol Pepcid following which patient symptoms improved.  Critical care was consulted and at this time since patient symptoms are improving patient is admitted to hospitalist service.  On my exam patient is not short of breath swelling is improved as per the patient's daughter was at the bedside but still has some swelling around the face but able to talk without difficulty no stridor no tongue swelling no wheezing has mild rash on all extremities.  Review of Systems: As per HPI, rest all negative.   Past Medical History:  Diagnosis Date  . Diabetes mellitus without complication ([facility])   . Hypertension   . IgA nephropathy 2019  . Language barrier   . Obesity   . Proteinuria   . Renal disorder     Past Surgical History:  Procedure Laterality Date  . NO PAST SURGERIES  10/2017     reports that she has never smoked. She has never used smokeless tobacco. She reports that she does not drink alcohol or use  drugs.  Allergies  Allergen Reactions  . Lisinopril Swelling    Angioedema     Family History  Problem Relation Age of Onset  . Diabetes Mother   . Hypertension Father     Prior to Admission medications   Medication Sig Start Date End Date Taking? Authorizing Provider  amLODipine (NORVASC) 5 MG tablet Take 1 tablet (5 mg total) by mouth daily. 01/06/18  Yes Tysinger, Camelia Eng, PA-C  cholecalciferol (VITAMIN D) 1000 units tablet Take 1 tablet (1,000 Units total) by mouth daily. 09/03/17  Yes Tysinger, Camelia Eng, PA-C  cyclophosphamide (CYTOXAN) 50 MG capsule Take 100 mg by mouth daily. Give on an empty stomach 1 hour before or 2 hours after meals.   Yes [provider]  furosemide (LASIX) 40 MG tablet Take 1 tablet (40 mg total) by mouth daily. 01/06/18  Yes Tysinger, Camelia Eng, PA-C  gabapentin (NEURONTIN) 100 MG capsule Take 1 capsule (100 mg total) by mouth at bedtime. 01/06/18 01/06/19 Yes Tysinger, Camelia Eng, PA-C  Insulin Glargine (BASAGLAR KWIKPEN) 100 UNIT/ML SOPN Inject 0.53 mLs (53 Units total) into the skin at bedtime. 01/06/18  Yes Tysinger, Camelia Eng, PA-C  insulin glargine (LANTUS) 100 unit/mL SOPN Begin with 5 units at bedtime.   Can increase 2 units weekly until morning glucose consistenly <130 09/02/17  Yes Tysinger, Camelia Eng, PA-C  levothyroxine (SYNTHROID) 50 MCG tablet Take 1 tablet (50 mcg total) by mouth daily. 01/06/18  01/06/19 Yes Tysinger, Camelia Eng, PA-C  lisinopril (PRINIVIL,ZESTRIL) 40 MG tablet Take 1 tablet (40 mg total) by mouth daily. 01/06/18  Yes Tysinger, Camelia Eng, PA-C  omeprazole (PRILOSEC) 40 MG capsule Take 1 capsule (40 mg total) by mouth daily. 09/02/17  Yes Tysinger, Camelia Eng, PA-C  potassium chloride (KLOR-CON 10) 10 MEQ tablet Take 1 tablet (10 mEq total) by mouth 2 (two) times daily. 09/03/17  Yes Tysinger, Camelia Eng, PA-C  promethazine (PHENERGAN) 25 MG tablet 1/2/-1 tablet po q6hr prn nausea Patient taking differently: Take 12.5-25 mg by mouth every 6 (six) hours as  needed for nausea.  10/27/17  Yes Tysinger, Camelia Eng, PA-C  rosuvastatin (CRESTOR) 20 MG tablet Take 1 tablet (20 mg total) by mouth daily. Patient taking differently: Take 40 mg by mouth daily.  01/06/18  Yes Tysinger, Camelia Eng, PA-C  hydrocortisone (ANUSOL-HC) 2.5 % rectal cream Place 1 application rectally 2 (two) times daily. Patient not taking: Reported on 01/19/2018 10/27/17   Tysinger, Camelia Eng, PA-C  Insulin Pen Needle 32G X 4 MM MISC 1 each by Does not apply route at bedtime. 01/06/18   Tysinger, Camelia Eng, PA-C  ondansetron (ZOFRAN) 4 MG tablet Take 1 tablet (4 mg total) by mouth every 8 (eight) hours as needed for nausea or vomiting. Patient not taking: Reported on 01/19/2018 10/27/17   Carlena Hurl, PA-C    Physical Exam: Vitals:   01/19/18 2145 01/19/18 2200 01/19/18 2245 01/20/18 0017  BP: (!) 105/56 (!) 111/57 101/65 140/83  Pulse: 75 74 80 77  Resp: 19 (!) 32 (!) 25 20  Temp:    98.4 F (36.9 C)  TempSrc:    Oral  SpO2: 100% 99% 97% 99%  Weight:    87.2 kg (192 lb 3.9 oz)  Height:    5' (1.524 m)      Constitutional: Moderately built and nourished. Vitals:   01/19/18 2145 01/19/18 2200 01/19/18 2245 01/20/18 0017  BP: (!) 105/56 (!) 111/57 101/65 140/83  Pulse: 75 74 80 77  Resp: 19 (!) 32 (!) 25 20  Temp:    98.4 F (36.9 C)  TempSrc:    Oral  SpO2: 100% 99% 97% 99%  Weight:    87.2 kg (192 lb 3.9 oz)  Height:    5' (1.524 m)   Eyes: Swelling around the eyes.  No difficulty opening.  No conjunctival injection. ENMT: No mucosal lesions.  No tongue swelling.  Uvula seen. Neck: No stridor.  No JVD appreciated. Respiratory: No wheezing.  No crepitations.  Bilateral air entry present. Cardiovascular: S1-S2 heard. Abdomen: Soft nontender bowel sounds present. Musculoskeletal: No edema. Skin: Rash diffusely. Neurologic: Alert awake oriented to time place and person.  Moves all extremities. Psychiatric: Appears normal per normal affect.   Labs on Admission: I have  personally reviewed following labs and imaging studies  CBC: Recent Labs  Lab 01/19/18 2127  WBC 9.3  NEUTROABS 5.7  HGB 11.7*  HCT 35.5*  MCV 95.2  PLT 657   Basic Metabolic Panel: Recent Labs  Lab 01/19/18 2127  NA 139  K 3.3*  CL 105  CO2 22  GLUCOSE 248*  BUN 35*  CREATININE 2.67*  CALCIUM 8.4*   GFR: Estimated Creatinine Clearance: 25.9 mL/min (A) (by C-G formula based on SCr of 2.67 mg/dL (H)). Liver Function Tests: Recent Labs  Lab 01/19/18 2127  AST 25  ALT 29  ALKPHOS 54  BILITOT 0.8  PROT 5.9*  ALBUMIN 3.3*   No  results for input(s): LIPASE, AMYLASE in the last 168 hours. No results for input(s): AMMONIA in the last 168 hours. Coagulation Profile: No results for input(s): INR, PROTIME in the last 168 hours. Cardiac Enzymes: No results for input(s): CKTOTAL, CKMB, CKMBINDEX, TROPONINI in the last 168 hours. BNP (last 3 results) No results for input(s): PROBNP in the last 8760 hours. HbA1C: No results for input(s): HGBA1C in the last 72 hours. CBG: Recent Labs  Lab 01/20/18 0111  GLUCAP 185*   Lipid Profile: No results for input(s): CHOL, HDL, LDLCALC, TRIG, CHOLHDL, LDLDIRECT in the last 72 hours. Thyroid Function Tests: No results for input(s): TSH, T4TOTAL, FREET4, T3FREE, THYROIDAB in the last 72 hours. Anemia Panel: No results for input(s): VITAMINB12, FOLATE, FERRITIN, TIBC, IRON, RETICCTPCT in the last 72 hours. Urine analysis:    Component Value Date/Time   COLORURINE STRAW (A) 11/18/2015 1314   APPEARANCEUR CLEAR 11/18/2015 1314   LABSPEC neg 06/10/2017 1129   PHURINE 6.5 11/18/2015 1314   GLUCOSEU NEGATIVE 11/18/2015 1314   HGBUR SMALL (A) 11/18/2015 1314   BILIRUBINUR negative 06/10/2017 1129   BILIRUBINUR n 04/21/2016 1315   KETONESUR negative 06/10/2017 1129   KETONESUR NEGATIVE 11/18/2015 1314   PROTEINUR negative 06/10/2017 1129   PROTEINUR 1+ 04/21/2016 1315   PROTEINUR 100 (A) 11/18/2015 1314   UROBILINOGEN negative  04/21/2016 1315   UROBILINOGEN 0.2 03/18/2014 1558   NITRITE Negative 06/10/2017 1129   NITRITE n 04/21/2016 1315   NITRITE NEGATIVE 11/18/2015 1314   LEUKOCYTESUR Negative 06/10/2017 1129   Sepsis Labs: @LABRCNTIP (procalcitonin:4,lacticidven:4) )No results found for this or any previous visit (from the past 240 hour(s)).   Radiological Exams on Admission: No results found.    Assessment/Plan Principal Problem:   Anaphylaxis Active Problems:   Essential hypertension   Type 2 diabetes mellitus with neurological complications (HCC)   Renal failure (ARF), acute on chronic (HCC)   Normocytic normochromic anemia    1. Anaphylactic reaction -likely to either lisinopril or the Linda Black fruit.  Patient advised not to take either of them.  Since patient symptoms have improved with Solu-Medrol and Pepcid and Benadryl will keep patient on that for now.  Closely observe and stepdown. 2. Acute on chronic kidney disease stage III with IgA nephropathy being followed by Dr. Clover Mealy, nephrologist.  On Cytoxan.  Holding off lisinopril due to allergic reaction see #1.  Gently hydrating for now.  May discuss with nephrologist in a.m. with regarding her lisinopril. 3. Diabetes mellitus type 2 on Lantus insulin 5 units at bedtime.  Confirmed with patient's daughter.  Positive follow CBGs since patient is on Solu-Medrol. 4. Anemia likely from renal disease -follow CBC. 5. Hypertension continue amlodipine.  Will keep patient on PRN IV hydralazine.  Lisinopril discontinued due to anaphylactic reaction. 6. Hyperlipidemia on statins. 7. Hypothyroidism on Synthroid.   DVT prophylaxis: SCDs for now. Code Status: Full code. Family Communication: Patient's daughters. Disposition Plan: Home. Consults called: None.  ER physician discussed with critical care. Admission status: Observation.   Rise Patience MD [facility] Pager [phone].  If 7PM-7AM, please contact  night-coverage [email or web address] Password [email or web address]  01/20/2018, 2:09 AM

## 2018-01-21 DIAGNOSIS — E785 Hyperlipidemia, unspecified: Secondary | ICD-10-CM | POA: Diagnosis present

## 2018-01-21 DIAGNOSIS — D631 Anemia in chronic kidney disease: Secondary | ICD-10-CM | POA: Diagnosis present

## 2018-01-21 DIAGNOSIS — T886XXA Anaphylactic reaction due to adverse effect of correct drug or medicament properly administered, initial encounter: Secondary | ICD-10-CM | POA: Diagnosis present

## 2018-01-21 DIAGNOSIS — Z8249 Family history of ischemic heart disease and other diseases of the circulatory system: Secondary | ICD-10-CM | POA: Diagnosis not present

## 2018-01-21 DIAGNOSIS — N183 Chronic kidney disease, stage 3 (moderate): Secondary | ICD-10-CM | POA: Diagnosis present

## 2018-01-21 DIAGNOSIS — I129 Hypertensive chronic kidney disease with stage 1 through stage 4 chronic kidney disease, or unspecified chronic kidney disease: Secondary | ICD-10-CM | POA: Diagnosis present

## 2018-01-21 DIAGNOSIS — E559 Vitamin D deficiency, unspecified: Secondary | ICD-10-CM | POA: Diagnosis present

## 2018-01-21 DIAGNOSIS — T782XXA Anaphylactic shock, unspecified, initial encounter: Secondary | ICD-10-CM | POA: Diagnosis not present

## 2018-01-21 DIAGNOSIS — I10 Essential (primary) hypertension: Secondary | ICD-10-CM | POA: Diagnosis not present

## 2018-01-21 DIAGNOSIS — K219 Gastro-esophageal reflux disease without esophagitis: Secondary | ICD-10-CM | POA: Diagnosis present

## 2018-01-21 DIAGNOSIS — Z794 Long term (current) use of insulin: Secondary | ICD-10-CM | POA: Diagnosis not present

## 2018-01-21 DIAGNOSIS — E669 Obesity, unspecified: Secondary | ICD-10-CM | POA: Diagnosis present

## 2018-01-21 DIAGNOSIS — E1149 Type 2 diabetes mellitus with other diabetic neurological complication: Secondary | ICD-10-CM | POA: Diagnosis present

## 2018-01-21 DIAGNOSIS — E039 Hypothyroidism, unspecified: Secondary | ICD-10-CM | POA: Diagnosis present

## 2018-01-21 DIAGNOSIS — N179 Acute kidney failure, unspecified: Secondary | ICD-10-CM | POA: Diagnosis present

## 2018-01-21 DIAGNOSIS — E876 Hypokalemia: Secondary | ICD-10-CM | POA: Diagnosis present

## 2018-01-21 DIAGNOSIS — N028 Recurrent and persistent hematuria with other morphologic changes: Secondary | ICD-10-CM | POA: Diagnosis present

## 2018-01-21 DIAGNOSIS — E1122 Type 2 diabetes mellitus with diabetic chronic kidney disease: Secondary | ICD-10-CM | POA: Diagnosis present

## 2018-01-21 DIAGNOSIS — T7800XA Anaphylactic reaction due to unspecified food, initial encounter: Secondary | ICD-10-CM | POA: Diagnosis not present

## 2018-01-21 DIAGNOSIS — Z833 Family history of diabetes mellitus: Secondary | ICD-10-CM | POA: Diagnosis not present

## 2018-01-21 DIAGNOSIS — Z6834 Body mass index (BMI) 34.0-34.9, adult: Secondary | ICD-10-CM | POA: Diagnosis not present

## 2018-01-21 DIAGNOSIS — T464X5A Adverse effect of angiotensin-converting-enzyme inhibitors, initial encounter: Secondary | ICD-10-CM | POA: Diagnosis present

## 2018-01-21 DIAGNOSIS — T7809XA Anaphylactic reaction due to other food products, initial encounter: Secondary | ICD-10-CM | POA: Diagnosis present

## 2018-01-21 DIAGNOSIS — Z79890 Hormone replacement therapy (postmenopausal): Secondary | ICD-10-CM | POA: Diagnosis not present

## 2018-01-21 LAB — TROPONIN I

## 2018-01-21 LAB — GLUCOSE, CAPILLARY
GLUCOSE-CAPILLARY: 164 mg/dL — AB (ref 65–99)
Glucose-Capillary: 215 mg/dL — ABNORMAL HIGH (ref 65–99)
Glucose-Capillary: 143 mg/dL — ABNORMAL HIGH (ref 65–99)
Glucose-Capillary: 255 mg/dL — ABNORMAL HIGH (ref 65–99)

## 2018-01-21 LAB — HIV ANTIBODY (ROUTINE TESTING W REFLEX): HIV SCREEN 4TH GENERATION: NONREACTIVE

## 2018-01-21 MED ORDER — PREDNISONE 20 MG PO TABS
50.0000 mg | ORAL_TABLET | Freq: Every day | ORAL | Status: DC
  Administered 2018-01-22: 50 mg via ORAL
  Filled 2018-01-21: qty 2

## 2018-01-21 MED ORDER — INSULIN GLARGINE 100 UNIT/ML ~~LOC~~ SOLN
8.0000 [IU] | Freq: Every day | SUBCUTANEOUS | Status: DC
  Administered 2018-01-21: 8 [IU] via SUBCUTANEOUS
  Filled 2018-01-21: qty 0.08

## 2018-01-21 MED ORDER — FAMOTIDINE 20 MG PO TABS
20.0000 mg | ORAL_TABLET | Freq: Two times a day (BID) | ORAL | Status: DC
  Administered 2018-01-21 – 2018-01-22 (×2): 20 mg via ORAL
  Filled 2018-01-21 (×2): qty 1

## 2018-01-21 MED ORDER — AMLODIPINE BESYLATE 5 MG PO TABS
5.0000 mg | ORAL_TABLET | Freq: Once | ORAL | Status: AC
  Administered 2018-01-21: 5 mg via ORAL
  Filled 2018-01-21: qty 1

## 2018-01-21 MED ORDER — HYDRALAZINE HCL 10 MG PO TABS
10.0000 mg | ORAL_TABLET | Freq: Three times a day (TID) | ORAL | Status: DC
  Administered 2018-01-21 – 2018-01-22 (×3): 10 mg via ORAL
  Filled 2018-01-21 (×3): qty 1

## 2018-01-21 MED ORDER — SODIUM CHLORIDE 0.9 % IV SOLN
INTRAVENOUS | Status: DC
  Administered 2018-01-21 – 2018-01-22 (×2): via INTRAVENOUS

## 2018-01-21 NOTE — Progress Notes (Addendum)
PROGRESS NOTE    Linda Black  URK:[id number] DOB: 08-14-1970 DOA: 01/19/2018 PCP: Carlena Hurl, PA-C   Brief Narrative:   47 y.o. AF (primary language [place]) PMHx  IgA nephropathy on Cytoxan and lisinopril, DM Type 2 on Lantus, HTN, Chronic Anemia   Brought to the ER after patient had swelling of the face and rash diffusely all over the body.  Patient states that 3 weeks ago when she had Barnabas Lister fluid she had a similar reaction but to a lesser extent.  Last evening she had diffuse rash with facial swelling and she also briefly passed out.  EMS was called and patient was brought to the ER.  As per the daughter patient also was recently started on lisinopril 3 weeks ago.  Patient denies any difficulty breathing and did not have any tongue swelling.   ED Course: Patient was given 2 doses of epinephrine and Solu-Medrol Pepcid following which patient symptoms improved.  Critical care was consulted and at this time since patient symptoms are improving patient is admitted to hospitalist service.  On my exam patient is not short of breath swelling is improved as per the patient's daughter was at the bedside but still has some swelling around the face but able to talk without difficulty no stridor no tongue swelling no wheezing has mild rash on all extremities.      Subjective: 6/20 A/O x4, negative S OB, positive CP described as tightness (substernal), increased with inspiration, negative radiation.  Similar to pain when she was admitted but not as intense.  Negative abdominal pain     Assessment & Plan:   Principal Problem:   Anaphylaxis Active Problems:   Essential hypertension   Type 2 diabetes mellitus with neurological complications (HCC)   Renal failure (ARF), acute on chronic (HCC)   Normocytic normochromic anemia      Anaphylactic reaction - Likely to lisinopril or Barnabas Lister fruit -Pepcid 20 mg BID - 6/21 start prednisone 50 mg daily titrate over the next week at  discharge -Benadryl 25 mg BID -Schedule follow-up with PA. Chana Bode in 1 week anaphylactic reaction to lisinopril/Jack fruit, HTN, acute on CKD stage III  Acute on CKD stage III IgA nephropathy(baseline Cr= 1.53) -Followed by Dr. Moshe Cipro nephrologist - On Cytotoxin mg daily - Lisinopril discontinued secondary to anaphylactic reaction - Hold all nephrotoxic medication Recent Labs  Lab 01/19/18 2127 01/20/18 0158  CREATININE 2.67* 2.31*  -Normal saline 75,l/hr  Chest pain - Described as tightness.  Most likely secondary to elevated BP  -EKG NSR - Trend troponin  Essential HTN -6/20 increase Amlodipine 10 mg daily  -6/20 Hydralazine 10 mg TID - Hydralazine PRN    Diabetes type 2 controlled without complication -8/31 hemoglobin A1c-6.2 -6/20 increase Lantus 8 units daily  - Sensitive SSI -We will notify nephrology that we have discontinued lisinopril patient hospitalized  HLD - Lipid panel not within ADA guidelines.   -6/19 lipid panel not within ADA/AHA guidelines  - Crestor 40 mg daily.  PCP will need to add additional agent to bring patient within ADA/AHA guideline  Normocytic anemia -Likely from renal disease - Anemia panel: Most consistent with normocytic anemia  Hypothyroidism -Synthroid 50 mcg daily      DVT prophylaxis: SCD Code Status: Full Family Communication: Daughter at bedside for discussion plan of care and to act as translator Disposition Plan: Discharge next 24 to 48 hours   Consultants:  PCCM     Procedures/Significant Events:  I have personally reviewed and interpreted all radiology studies and my findings are as above.  VENTILATOR SETTINGS:    Cultures   Antimicrobials: Anti-infectives (From admission, onward)   None       Devices    LINES / TUBES:      Continuous Infusions: . famotidine (PEPCID) IV Stopped (01/21/18 0700)     Objective: Vitals:   01/21/18 0036 01/21/18 0447 01/21/18 0837  01/21/18 1321  BP: 121/68 (!) 148/83 (!) 166/84 (!) 167/86  Pulse: 73 67 67 60  Resp: (!) 22 (!) 33 (!) 21 18  Temp: 98.2 F (36.8 C) 97.6 F (36.4 C) 98.4 F (36.9 C) 98.3 F (36.8 C)  TempSrc: Oral Oral Oral Oral  SpO2: 100% 100% 100% 100%  Weight:      Height:        Intake/Output Summary (Last 24 hours) at 01/21/2018 1631 Last data filed at 01/21/2018 1000 Gross per 24 hour  Intake 2936.33 ml  Output 1 ml  Net 2935.33 ml   Filed Weights   01/19/18 2041 01/20/18 0017  Weight: 178 lb (80.7 kg) 192 lb 3.9 oz (87.2 kg)    Physical Exam:  General: A/O x4, No acute respiratory distress Eyes: negative scleral hemorrhage, negative anisocoria, negative icterus ENT: Negative Runny nose, negative gingival bleeding, tongue swelling resolved Neck:  Negative scars, masses, torticollis, lymphadenopathy, JVD Lungs: Clear to auscultation bilaterally without wheezes or crackles Cardiovascular: Regular rate and rhythm without murmur gallop or rub normal S1 and S2 Abdomen: negative abdominal pain, nondistended, positive soft, bowel sounds, no rebound, no ascites, no appreciable mass Extremities: No significant cyanosis, clubbing, or edema bilateral lower extremities Skin: Negative rashes, lesions, ulcers Psychiatric:  Negative depression, negative anxiety, negative fatigue, negative mania  Central nervous system:  Cranial nerves II through XII intact, tongue/uvula midline, all extremities muscle strength 5/5, sensation intact throughout, negative dysarthria, negative expressive aphasia, negative receptive aphasia.  .     Data Reviewed: Care during the described time interval was provided by me .  I have reviewed this patient's available data, including medical history, events of note, physical examination, and all test results as part of my evaluation.   CBC: Recent Labs  Lab 01/19/18 2127 01/20/18 0158  WBC 9.3 7.9  NEUTROABS 5.7 7.4  HGB 11.7* 11.1*  HCT 35.5* 33.5*  MCV 95.2  94.9  PLT 254 400   Basic Metabolic Panel: Recent Labs  Lab 01/19/18 2127 01/20/18 0158  NA 139 139  K 3.3* 4.4  CL 105 108  CO2 22 22  GLUCOSE 248* 205*  BUN 35* 33*  CREATININE 2.67* 2.31*  CALCIUM 8.4* 8.7*  MG  --  2.1   GFR: Estimated Creatinine Clearance: 29.9 mL/min (A) (by C-G formula based on SCr of 2.31 mg/dL (H)). Liver Function Tests: Recent Labs  Lab 01/19/18 2127  AST 25  ALT 29  ALKPHOS 54  BILITOT 0.8  PROT 5.9*  ALBUMIN 3.3*   No results for input(s): LIPASE, AMYLASE in the last 168 hours. No results for input(s): AMMONIA in the last 168 hours. Coagulation Profile: No results for input(s): INR, PROTIME in the last 168 hours. Cardiac Enzymes: No results for input(s): CKTOTAL, CKMB, CKMBINDEX, TROPONINI in the last 168 hours. BNP (last 3 results) No results for input(s): PROBNP in the last 8760 hours. HbA1C: No results for input(s): HGBA1C in the last 72 hours. CBG: Recent Labs  Lab 01/20/18 1208 01/20/18 1702 01/20/18 2050 01/21/18 0727 01/21/18 1157  GLUCAP 115*  166* 208* 215* 143*   Lipid Profile: Recent Labs    01/20/18 1239  CHOL 145  HDL 56  LDLCALC 73  TRIG 78  CHOLHDL 2.6   Thyroid Function Tests: No results for input(s): TSH, T4TOTAL, FREET4, T3FREE, THYROIDAB in the last 72 hours. Anemia Panel: Recent Labs    01/20/18 1239  VITAMINB12 335  FOLATE 6.5  FERRITIN 122  TIBC 347  IRON 65  RETICCTPCT 3.0   Urine analysis:    Component Value Date/Time   COLORURINE STRAW (A) 11/18/2015 1314   APPEARANCEUR CLEAR 11/18/2015 1314   LABSPEC neg 06/10/2017 1129   PHURINE 6.5 11/18/2015 1314   GLUCOSEU NEGATIVE 11/18/2015 1314   HGBUR SMALL (A) 11/18/2015 1314   BILIRUBINUR negative 06/10/2017 1129   BILIRUBINUR n 04/21/2016 1315   KETONESUR negative 06/10/2017 1129   KETONESUR NEGATIVE 11/18/2015 1314   PROTEINUR negative 06/10/2017 1129   PROTEINUR 1+ 04/21/2016 1315   PROTEINUR 100 (A) 11/18/2015 1314    UROBILINOGEN negative 04/21/2016 1315   UROBILINOGEN 0.2 03/18/2014 1558   NITRITE Negative 06/10/2017 1129   NITRITE n 04/21/2016 1315   NITRITE NEGATIVE 11/18/2015 1314   LEUKOCYTESUR Negative 06/10/2017 1129   Sepsis Labs: @LABRCNTIP (procalcitonin:4,lacticidven:4)  ) Recent Results (from the past 240 hour(s))  MRSA PCR Screening     Status: None   Collection Time: 01/20/18 12:41 AM  Result Value Ref Range Status   MRSA by PCR NEGATIVE NEGATIVE Final    Comment:        The GeneXpert MRSA Assay (FDA approved for NASAL specimens only), is one component of a comprehensive MRSA colonization surveillance program. It is not intended to diagnose MRSA infection nor to guide or monitor treatment for MRSA infections. Performed at [facility], [place] [address], [place], [place] [address]          Radiology Studies: No results found.      Scheduled Meds: . amLODipine  5 mg Oral Daily  . cyclophosphamide  100 mg Oral Daily  . diphenhydrAMINE  25 mg Oral Q12H  . gabapentin  100 mg Oral QHS  . insulin aspart  0-9 Units Subcutaneous TID WC  . insulin glargine  5 Units Subcutaneous QHS  . levothyroxine  50 mcg Oral QAC breakfast  . methylPREDNISolone (SOLU-MEDROL) injection  40 mg Intravenous Q12H  . rosuvastatin  40 mg Oral Daily   Continuous Infusions: . famotidine (PEPCID) IV Stopped (01/21/18 0700)     LOS: 0 days    Time spent: 40 minutes    WOODS, Geraldo Docker, MD [facility] Pager [phone]   If 7PM-7AM, please contact night-coverage [email or web address] Password [facility] 01/21/2018, 4:31 PM

## 2018-01-22 DIAGNOSIS — I10 Essential (primary) hypertension: Secondary | ICD-10-CM

## 2018-01-22 LAB — BASIC METABOLIC PANEL
Anion gap: 9 (ref 5–15)
BUN: 30 mg/dL — AB (ref 6–20)
CHLORIDE: 110 mmol/L (ref 101–111)
CO2: 23 mmol/L (ref 22–32)
Calcium: 8.9 mg/dL (ref 8.9–10.3)
Creatinine, Ser: 1.85 mg/dL — ABNORMAL HIGH (ref 0.44–1.00)
GFR calc Af Amer: 37 mL/min — ABNORMAL LOW (ref >60)
GFR calc non Af Amer: 32 mL/min — ABNORMAL LOW (ref >60)
GLUCOSE: 225 mg/dL — AB (ref 65–99)
POTASSIUM: 4.1 mmol/L (ref 3.5–5.1)
SODIUM: 142 mmol/L (ref 135–145)

## 2018-01-22 LAB — TROPONIN I
Troponin I: <0.03 ng/mL (ref <0.03)
Troponin I: <0.03 ng/mL (ref <0.03)

## 2018-01-22 LAB — MAGNESIUM: Magnesium: 2.3 mg/dL (ref 1.7–2.4)

## 2018-01-22 LAB — GLUCOSE, CAPILLARY: Glucose-Capillary: 78 mg/dL (ref 65–99)

## 2018-01-22 MED ORDER — DIPHENHYDRAMINE HCL 25 MG PO CAPS: 25.0000 mg | ORAL_CAPSULE | Freq: Two times a day (BID) | ORAL | 0 refills | Status: DC

## 2018-01-22 MED ORDER — EPINEPHRINE 0.3 MG/0.3ML IJ SOAJ: INTRAMUSCULAR | 0 refills | Status: DC

## 2018-01-22 MED ORDER — FAMOTIDINE 20 MG PO TABS: 20.0000 mg | ORAL_TABLET | Freq: Two times a day (BID) | ORAL | 0 refills | Status: DC

## 2018-01-22 MED ORDER — AMLODIPINE BESYLATE 10 MG PO TABS: 10.0000 mg | ORAL_TABLET | Freq: Every day | ORAL | 0 refills | Status: DC

## 2018-01-22 MED ORDER — FUROSEMIDE 40 MG PO TABS: ORAL_TABLET | ORAL | 3 refills | Status: DC

## 2018-01-22 MED ORDER — PREDNISONE 10 MG PO TABS: ORAL_TABLET | ORAL | 0 refills | Status: DC

## 2018-01-22 MED ORDER — HYDRALAZINE HCL 10 MG PO TABS: 10.0000 mg | ORAL_TABLET | Freq: Three times a day (TID) | ORAL | 0 refills | Status: DC

## 2018-01-22 NOTE — Discharge Summary (Signed)
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 Patient stateed that 3 weeks ago when she had Jackfluid she had a similar reaction but to a lesser extent. She had diffuse rash with facial swelling and she also briefly passed out. EMS was called and patient was brought to the ER. As per the daughter patient also was recently started on lisinopril 3 weeks ago. Patient denies any difficulty breathing and did not have any tongue swelling on presentation.Patient was managed for anaphylaxis.She was started on steroids,H1/H2 blockes. Currently her overall status is stable and she is being discharged today to home.  Following problems were addressed during her hospitalization:  Anaphylactic reaction - Likely to lisinopril or Barnabas Lister fruit -Pepcid 20 mg BID - 6/21 start prednisone 50 mg daily titrate over the next week at discharge -Benadryl 25 mg BID -Schedule follow-up with PA. Chana Bode in 1 week anaphylactic reaction to lisinopril/Jack fruit, HTN, acute on CKD stage III  Acute on CKD stage III IgA nephropathy(baseline Cr= 1.53) -Followed by Dr. Moshe Cipro nephrologist - On Cytotoxin mg daily - Lisinopril discontinued secondary to anaphylactic reaction - Hold all nephrotoxic medication.Will recommend to restart lasix only after a week after checking kidney function.   Chest pain - Resolved  -EKG NSR -Negative troponin  Essential HTN -6/20 increase Amlodipine 10 mg  daily  -6/20 Hydralazine 10 mg TID   Diabetes type 2 controlled without complication -7/82 hemoglobin A1c-6.2 -Continue home regimen  HLD - Lipid panel not within ADA guidelines.   -6/19 lipid panel not within ADA/AHA guidelines  - Crestor 40 mg daily.  PCP will need to add additional agent to bring patient within ADA/AHA guideline  Normocytic anemia -Likely from renal disease - Anemia panel: Most consistent with normocytic anemia  Hypothyroidism -Synthroid 50 mcg daily    Discharge Diagnoses:  Principal Problem:   Anaphylaxis Active Problems:   Essential hypertension   Type 2 diabetes mellitus with neurological complications (HCC)   Renal failure (ARF), acute on chronic (HCC)   Normocytic normochromic anemia    Discharge Instructions  Discharge Instructions    Diet - low sodium heart healthy   Complete by:  As directed    Discharge instructions   Complete by:  As directed    1) Please follow up with your PCP in a week.Follow up with allergy specialist as an outpatient. Do a BMP test to check your kidney function during the follow up with your PCP. 2)Follow up with your nephrologist as an outpatient in 2 weeks. 3)Take prescribed medications as instructed. 4)Please stop taking lisinopril or any other ACE inhibitors or ARB.Stop taking Barnabas Lister fruit.Use epi-pen in case of anaphylaxis.   Increase activity slowly   Complete by:  As directed      Allergies as of 01/22/2018      Reactions   Lisinopril Swelling   Angioedema       Medication List    STOP taking these medications   lisinopril 40 MG tablet Commonly known as:  PRINIVIL,ZESTRIL     TAKE  these medications   amLODipine 5 MG tablet Commonly known as:  NORVASC Take 1 tablet (5 mg total) by mouth daily.   cholecalciferol 1000 units tablet Commonly known as:  VITAMIN D Take 1 tablet (1,000 Units total) by mouth daily.   cyclophosphamide 50 MG capsule Commonly known as:  CYTOXAN Take 100 mg by  mouth daily. Give on an empty stomach 1 hour before or 2 hours after meals.   diphenhydrAMINE 25 mg capsule Commonly known as:  BENADRYL Take 1 capsule (25 mg total) by mouth every 12 (twelve) hours for 5 days.   EPINEPHrine 0.3 mg/0.3 mL Soaj injection Commonly known as:  EPI-PEN Only use during anaphylaxis   famotidine 20 MG tablet Commonly known as:  PEPCID Take 1 tablet (20 mg total) by mouth 2 (two) times daily for 5 days.   furosemide 40 MG tablet Commonly known as:  LASIX Take only after a week after doing a BMP test What changed:    how much to take  how to take this  when to take this  additional instructions   gabapentin 100 MG capsule Commonly known as:  NEURONTIN Take 1 capsule (100 mg total) by mouth at bedtime.   hydrocortisone 2.5 % rectal cream Commonly known as:  ANUSOL-HC Place 1 application rectally 2 (two) times daily.   insulin glargine 100 unit/mL Sopn Commonly known as:  LANTUS Begin with 5 units at bedtime.   Can increase 2 units weekly until morning glucose consistenly <130   BASAGLAR KWIKPEN 100 UNIT/ML Sopn Inject 0.53 mLs (53 Units total) into the skin at bedtime.   Insulin Pen Needle 32G X 4 MM Misc 1 each by Does not apply route at bedtime.   levothyroxine 50 MCG tablet Commonly known as:  SYNTHROID Take 1 tablet (50 mcg total) by mouth daily.   omeprazole 40 MG capsule Commonly known as:  PRILOSEC Take 1 capsule (40 mg total) by mouth daily.   ondansetron 4 MG tablet Commonly known as:  ZOFRAN Take 1 tablet (4 mg total) by mouth every 8 (eight) hours as needed for nausea or vomiting.   potassium chloride 10 MEQ tablet Commonly known as:  KLOR-CON 10 Take 1 tablet (10 mEq total) by mouth 2 (two) times daily.   predniSONE 10 MG tablet Commonly known as:  DELTASONE Take 4 pills a day for 3 days then 2 pills a day for 3 days then 1 pill a day for 3 days then stop   promethazine 25 MG tablet Commonly known as:   PHENERGAN 1/2/-1 tablet po q6hr prn nausea What changed:    how much to take  how to take this  when to take this  reasons to take this  additional instructions   rosuvastatin 20 MG tablet Commonly known as:  CRESTOR Take 1 tablet (20 mg total) by mouth daily. What changed:  how much to take      Follow-up Information    Tysinger, Camelia Eng, PA-C. Schedule an appointment as soon as possible for a visit in 1 week(s).   Specialty:  Family Medicine Why:  Schedule follow-up with PA. Chana Bode in 1 week anaphylactic reaction to lisinopril/Jack fruit, HTN, acute on CKD stage III Contact information: [place] [address] [phone]          Allergies  Allergen Reactions  . Lisinopril Swelling    Angioedema     Consultations: PCCM  Procedures/Studies:  No results found.      Discharge  Exam: Vitals:   01/22/18 0449 01/22/18 0803  BP: 139/75 (!) 148/79  Pulse: 75 63  Resp: (!) 21 18  Temp: 97.7 F (36.5 C) (!) 97.3 F (36.3 C)  SpO2: 98% 99%   Vitals:   01/22/18 0000 01/22/18 0100 01/22/18 0449 01/22/18 0803  BP: (!) 142/81 (!) 150/132 139/75 (!) 148/79  Pulse: 73 63 75 63  Resp: (!) 22 20 (!) 21 18  Temp:   97.7 F (36.5 C) (!) 97.3 F (36.3 C)  TempSrc:   Oral Oral  SpO2: 95% 100% 98% 99%  Weight:   89.2 kg (196 lb 10.4 oz)   Height:        General: Pt is alert, awake, not in acute distress Cardiovascular: RRR, S1/S2 +, no rubs, no gallops Respiratory: CTA bilaterally, no wheezing, no rhonchi Abdominal: Soft, NT, ND, bowel sounds + Extremities: no edema, no cyanosis    The results of significant diagnostics from this hospitalization (including imaging, microbiology, ancillary and laboratory) are listed below for reference.     Microbiology: Recent Results (from the past 240 hour(s))  MRSA PCR Screening     Status: None   Collection Time: 01/20/18 12:41 AM  Result Value Ref Range Status   MRSA by PCR NEGATIVE  NEGATIVE Final    Comment:        The GeneXpert MRSA Assay (FDA approved for NASAL specimens only), is one component of a comprehensive MRSA colonization surveillance program. It is not intended to diagnose MRSA infection nor to guide or monitor treatment for MRSA infections. Performed at [facility], [place] [address] [place], [place] [address]      Labs: BNP (last 3 results) No results for input(s): BNP in the last 8760 hours. Basic Metabolic Panel: Recent Labs  Lab 01/19/18 2127 01/20/18 0158 01/22/18 0223  NA 139 139 142  K 3.3* 4.4 4.1  CL 105 108 110  CO2 22 22 23   GLUCOSE 248* 205* 225*  BUN 35* 33* 30*  CREATININE 2.67* 2.31* 1.85*  CALCIUM 8.4* 8.7* 8.9  MG  --  2.1 2.3   Liver Function Tests: Recent Labs  Lab 01/19/18 2127  AST 25  ALT 29  ALKPHOS 54  BILITOT 0.8  PROT 5.9*  ALBUMIN 3.3*   No results for input(s): LIPASE, AMYLASE in the last 168 hours. No results for input(s): AMMONIA in the last 168 hours. CBC: Recent Labs  Lab 01/19/18 2127 01/20/18 0158  WBC 9.3 7.9  NEUTROABS 5.7 7.4  HGB 11.7* 11.1*  HCT 35.5* 33.5*  MCV 95.2 94.9  PLT 254 211   Cardiac Enzymes: Recent Labs  Lab 01/21/18 1822 01/22/18 0223 01/22/18 0659  TROPONINI <0.03 <0.03 <0.03   BNP: Invalid input(s): POCBNP CBG: Recent Labs  Lab 01/21/18 0727 01/21/18 1157 01/21/18 1711 01/21/18 2115 01/22/18 0721  GLUCAP 215* 143* 164* 255* 78   D-Dimer No results for input(s): DDIMER in the last 72 hours. Hgb A1c No results for input(s): HGBA1C in the last 72 hours. Lipid Profile Recent Labs    01/20/18 1239  CHOL 145  HDL 56  LDLCALC 73  TRIG 78  CHOLHDL 2.6   Thyroid function studies No results for input(s): TSH, T4TOTAL, T3FREE, THYROIDAB in the last 72 hours.  Invalid input(s): FREET3 Anemia work up Recent Labs    01/20/18 1239  VITAMINB12 335  FOLATE 6.5  FERRITIN 122  TIBC 347  IRON 65  RETICCTPCT 3.0   Urinalysis     Component  Value Date/Time   COLORURINE STRAW (A) 11/18/2015 1314   APPEARANCEUR CLEAR 11/18/2015 1314   LABSPEC neg 06/10/2017 1129   PHURINE 6.5 11/18/2015 1314   GLUCOSEU NEGATIVE 11/18/2015 1314   HGBUR SMALL (A) 11/18/2015 1314   BILIRUBINUR negative 06/10/2017 1129   BILIRUBINUR n 04/21/2016 1315   KETONESUR negative 06/10/2017 1129   KETONESUR NEGATIVE 11/18/2015 1314   PROTEINUR negative 06/10/2017 1129   PROTEINUR 1+ 04/21/2016 1315   PROTEINUR 100 (A) 11/18/2015 1314   UROBILINOGEN negative 04/21/2016 1315   UROBILINOGEN 0.2 03/18/2014 1558   NITRITE Negative 06/10/2017 1129   NITRITE n 04/21/2016 1315   NITRITE NEGATIVE 11/18/2015 1314   LEUKOCYTESUR Negative 06/10/2017 1129   Sepsis Labs Invalid input(s): PROCALCITONIN,  WBC,  LACTICIDVEN Microbiology Recent Results (from the past 240 hour(s))  MRSA PCR Screening     Status: None   Collection Time: 01/20/18 12:41 AM  Result Value Ref Range Status   MRSA by PCR NEGATIVE NEGATIVE Final    Comment:        The GeneXpert MRSA Assay (FDA approved for NASAL specimens only), is one component of a comprehensive MRSA colonization surveillance program. It is not intended to diagnose MRSA infection nor to guide or monitor treatment for MRSA infections. Performed at [facility], [place] [address], [place], [place] [address]     Please note: You were cared for by a hospitalist during your hospital stay. Once you are discharged, your primary care physician will handle any further medical issues. Please note that NO REFILLS for any discharge medications will be authorized once you are discharged, as it is imperative that you return to your primary care physician (or establish a relationship with a primary care physician if you do not have one) for your post hospital discharge needs so that they can reassess your need for medications and monitor your lab values.    Time coordinating discharge: 40  minutes  SIGNED:   Shelly Coss, MD  [facility] 01/22/2018, 10:08 AM Pager [phone]  If 7PM-7AM, please contact night-coverage [email or web address] Password [email or web address]

## 2018-01-22 NOTE — Progress Notes (Signed)
Pt informed and agreed to be discharged home. Son present in room to translate. Printed off discharge instructions in [place] for pt. Pt alert and oriented x4. Pt vital signs stable on room air. Went over discharge instructions with pt and answered all questions. Gave pt prescriptions and removed pt IV. Pt wheeled off unit by nurse tech.

## 2018-01-25 ENCOUNTER — Ambulatory Visit: Payer: Medicaid Other | Admitting: Medical

## 2018-01-25 VITALS — BP 130/84 | HR 76 | Temp 97.9°F | Resp 16 | Ht 61.0 in | Wt 177.4 lb

## 2018-01-25 DIAGNOSIS — R609 Edema, unspecified: Secondary | ICD-10-CM

## 2018-01-25 DIAGNOSIS — I10 Essential (primary) hypertension: Secondary | ICD-10-CM | POA: Diagnosis not present

## 2018-01-25 DIAGNOSIS — E876 Hypokalemia: Secondary | ICD-10-CM | POA: Diagnosis not present

## 2018-01-25 DIAGNOSIS — T7840XD Allergy, unspecified, subsequent encounter: Secondary | ICD-10-CM

## 2018-01-25 DIAGNOSIS — E1149 Type 2 diabetes mellitus with other diabetic neurological complication: Secondary | ICD-10-CM

## 2018-01-25 DIAGNOSIS — E785 Hyperlipidemia, unspecified: Secondary | ICD-10-CM

## 2018-01-25 DIAGNOSIS — R7989 Other specified abnormal findings of blood chemistry: Secondary | ICD-10-CM

## 2018-01-25 DIAGNOSIS — T7840XA Allergy, unspecified, initial encounter: Secondary | ICD-10-CM

## 2018-01-25 NOTE — Patient Instructions (Addendum)
From the hospitalization report, several medications were changed   Recommendations  Do not take anymore lisinopril  Do not eat anymore Barnabas Lister fruit   her amlodipine blood pressure pill was increased from 5 mg to 10 mg daily.  Thus the remaining 5 mg tablets she has left she can double up and take 2 of these daily to equal 10 mg  her Crestor cholesterol pill was increased to 40 mg daily   She can finish out famotidine and prednisone which was for allergic reaction.  When she finishes these she will be done with these  One blood pressure pill was added called hydralazine.  This medicine is 10 mg, 1 tablet 3 times a day for blood pressure  She can resume Lasix 40 mg daily for swelling in her legs  On days in which her swelling is worse she could take 1-1/2 tablet a day for a few days to see if this helps  Continue the potassium, thyroid pill, gabapentin, cyclophosphamide, omeprazole, as usual  Consider switching pharmacies to [facility] and asked for the 30-day bubble pack for all the medicines are in one place divided out per day so she can just use this instead of having to open every single bottle   [facility] Address: [address] [place], [place] [address] Phone: [phone]

## 2018-01-25 NOTE — Progress Notes (Signed)
Subjective: Chief Complaint  Patient presents with  . Follow-up    hospital f/u    Here with daughter and interpreter from language resources.   Admit date: 01/19/2018 Discharge date: 01/22/2018  She was hospitalized for anaphylaxis, angioedema.  It is not clear if this was from lisinopril or Barnabas Lister fruit, but could be either.  She has not taken any more lisinopril or consumed any more fruit since hospitalization.  No current swelling or issues.  She does note ongoing leg swelling.  She has made the medication changes.  She has no other new complaint.  Past Medical History:  Diagnosis Date  . Diabetes mellitus without complication ([place])   . Hypertension   . IgA nephropathy 2019  . Language barrier   . Obesity   . Proteinuria   . Renal disorder    Current Outpatient Medications on File Prior to Visit  Medication Sig Dispense Refill  . amLODipine (NORVASC) 10 MG tablet Take 1 tablet (10 mg total) by mouth daily. 30 tablet 0  . cholecalciferol (VITAMIN D) 1000 units tablet Take 1 tablet (1,000 Units total) by mouth daily. 90 tablet 3  . cyclophosphamide (CYTOXAN) 50 MG capsule Take 100 mg by mouth daily. Give on an empty stomach 1 hour before or 2 hours after meals.    . diphenhydrAMINE (BENADRYL) 25 mg capsule Take 1 capsule (25 mg total) by mouth every 12 (twelve) hours for 5 days. 10 capsule 0  . EPINEPHrine 0.3 mg/0.3 mL IJ SOAJ injection Only use during anaphylaxis 1 Device 0  . famotidine (PEPCID) 20 MG tablet Take 1 tablet (20 mg total) by mouth 2 (two) times daily for 5 days. 10 tablet 0  . furosemide (LASIX) 40 MG tablet Take only after a week after doing a BMP test 90 tablet 3  . gabapentin (NEURONTIN) 100 MG capsule Take 1 capsule (100 mg total) by mouth at bedtime. 30 capsule 11  . hydrALAZINE (APRESOLINE) 10 MG tablet Take 1 tablet (10 mg total) by mouth 3 (three) times daily. 90 tablet 0  . Insulin Glargine (BASAGLAR KWIKPEN) 100 UNIT/ML SOPN Inject 0.53 mLs (53 Units total)  into the skin at bedtime. 3 mL 11  . insulin glargine (LANTUS) 100 unit/mL SOPN Begin with 5 units at bedtime.   Can increase 2 units weekly until morning glucose consistenly <130 15 mL 5  . Insulin Pen Needle 32G X 4 MM MISC 1 each by Does not apply route at bedtime. 100 each 11  . levothyroxine (SYNTHROID) 50 MCG tablet Take 1 tablet (50 mcg total) by mouth daily. 30 tablet 11  . omeprazole (PRILOSEC) 40 MG capsule Take 1 capsule (40 mg total) by mouth daily. 90 capsule 3  . ondansetron (ZOFRAN) 4 MG tablet Take 1 tablet (4 mg total) by mouth every 8 (eight) hours as needed for nausea or vomiting. 20 tablet 0  . potassium chloride (KLOR-CON 10) 10 MEQ tablet Take 1 tablet (10 mEq total) by mouth 2 (two) times daily. 180 tablet 3  . predniSONE (DELTASONE) 10 MG tablet Take 4 pills a day for 3 days then 2 pills a day for 3 days then 1 pill a day for 3 days then stop 21 tablet 0  . promethazine (PHENERGAN) 25 MG tablet 1/2/-1 tablet po q6hr prn nausea (Patient taking differently: Take 12.5-25 mg by mouth every 6 (six) hours as needed for nausea. ) 20 tablet 0  . rosuvastatin (CRESTOR) 20 MG tablet Take 1 tablet (20 mg total) by  mouth daily. (Patient taking differently: Take 40 mg by mouth daily. ) 90 tablet 3  . hydrocortisone (ANUSOL-HC) 2.5 % rectal cream Place 1 application rectally 2 (two) times daily. (Patient not taking: Reported on 01/19/2018) 30 g 0   Current Facility-Administered Medications on File Prior to Visit  Medication Dose Route Frequency Provider Last Rate Last Dose  . promethazine (PHENERGAN) injection 50 mg  50 mg Intramuscular Q6H PRN Carlena Hurl, PA-C   50 mg at 09/25/17 1215   ROS as in subjective    Objective BP 130/84   Pulse 76   Temp 97.9 F (36.6 C) (Oral)   Resp 16   Ht 5\' 1"  (1.549 m)   Wt 177 lb 6.4 oz (80.5 kg)   SpO2 98%   BMI 33.52 kg/m   Wt Readings from Last 3 Encounters:  01/25/18 177 lb 6.4 oz (80.5 kg)  01/22/18 196 lb 10.4 oz (89.2 kg)   01/15/18 177 lb 6.4 oz (80.5 kg)   BP Readings from Last 3 Encounters:  01/25/18 130/84  01/22/18 (!) 148/79  01/15/18 110/70   General appearance: alert, no distress, WD/WN,  No obvious angioderm or rash HEENT: normocephalic, sclerae anicteric, TMs pearly, nares patent, no discharge or erythema, pharynx normal Oral cavity: MMM, no lesions Neck: supple, no lymphadenopathy, no thyromegaly, no masses Heart: RRR, normal S1, S2, no murmurs Lungs: CTA bilaterally, no wheezes, rhonchi, or rales Ext: 1+ nonpitting LE edema Pulses: 2+ symmetric, upper and lower extremities, normal cap refill    Assessment: Encounter Diagnoses  Name Primary?  . Allergic reaction, subsequent encounter Yes  . Allergic reaction, initial encounter   . Essential hypertension   . Type 2 diabetes mellitus with neurological complications ([place])   . Hyperlipidemia, unspecified hyperlipidemia type   . Hypokalemia   . Abnormal thyroid blood test   . Edema, unspecified type      Plan: I reviewed her hospital discharge summary and recommendations.  BMET lab today.  We reviewed the medication changes from the hospital summary, clarified her questions and reviewed the recommendations below.  Of note she continues to complain of lower extremity edema although there is not much obvious edema on exam.  At best 1+ nonpitting, but her obesity may be part of what she perceives as swelling, lipidemia as well as edema.  I strongly recommended she [facility] to have monthly bubble wrap medications mailed to her house to make it easier on her day-to-day medications compliance.  Follow-up pending labs.  Patient Instructions  From the hospitalization report, several medications were changed   Recommendations  Do not take anymore lisinopril  Do not eat anymore Barnabas Lister fruit   her amlodipine blood pressure pill was increased from 5 mg to 10 mg daily.  Thus the remaining 5 mg tablets she has left she can double up  and take 2 of these daily to equal 10 mg  her Crestor cholesterol pill was increased to 40 mg daily   She can finish out famotidine and prednisone which was for allergic reaction.  When she finishes these she will be done with these  One blood pressure pill was added called hydralazine.  This medicine is 10 mg, 1 tablet 3 times a day for blood pressure  She can resume Lasix 40 mg daily for swelling in her legs  On days in which her swelling is worse she could take 1-1/2 tablet a day for a few days to see if this helps  Continue  the potassium, thyroid pill, gabapentin, cyclophosphamide, omeprazole, as usual  Consider switching pharmacies to [facility] and asked for the 30-day bubble pack for all the medicines are in one place divided out per day so she can just use this instead of having to open every single bottle   [facility] Address: [address] [place], [place] [address] Phone: [phone]

## 2018-01-26 ENCOUNTER — Other Ambulatory Visit: Payer: Self-pay | Admitting: Medical

## 2018-01-26 LAB — BASIC METABOLIC PANEL
BUN / CREAT RATIO: 21 (ref 9–23)
BUN: 45 mg/dL — AB (ref 6–24)
CHLORIDE: 101 mmol/L (ref 96–106)
CO2: 18 mmol/L — AB (ref 20–29)
Calcium: 8.7 mg/dL (ref 8.7–10.2)
Creatinine, Ser: 2.13 mg/dL — ABNORMAL HIGH (ref 0.57–1.00)
GFR calc Af Amer: 31 mL/min/{1.73_m2} — ABNORMAL LOW (ref >59)
GFR calc non Af Amer: 27 mL/min/{1.73_m2} — ABNORMAL LOW (ref >59)
GLUCOSE: 373 mg/dL — AB (ref 65–99)
Potassium: 5 mmol/L (ref 3.5–5.2)
SODIUM: 136 mmol/L (ref 134–144)

## 2018-01-26 MED ORDER — INSULIN GLARGINE 100 UNITS/ML SOLOSTAR PEN
15.0000 [IU] | PEN_INJECTOR | Freq: Every day | SUBCUTANEOUS | 5 refills | Status: DC
Start: 2018-01-26 — End: 2018-05-18

## 2018-01-26 MED ORDER — AMLODIPINE BESYLATE 10 MG PO TABS: 10.0000 mg | ORAL_TABLET | Freq: Every day | ORAL | 3 refills | Status: DC

## 2018-01-27 NOTE — Telephone Encounter (Signed)
P.A. Approved til 01/14/19, called pharmacy went thru and called daughter and informed

## 2018-02-08 ENCOUNTER — Ambulatory Visit: Payer: Medicaid Other | Admitting: Medical

## 2018-02-08 VITALS — BP 130/80 | HR 75 | Temp 97.9°F | Resp 16 | Ht 61.0 in | Wt 172.2 lb

## 2018-02-08 DIAGNOSIS — N02B9 Other recurrent and persistent immunoglobulin A nephropathy: Secondary | ICD-10-CM

## 2018-02-08 DIAGNOSIS — E876 Hypokalemia: Secondary | ICD-10-CM | POA: Diagnosis not present

## 2018-02-08 DIAGNOSIS — N028 Recurrent and persistent hematuria with other morphologic changes: Secondary | ICD-10-CM | POA: Diagnosis not present

## 2018-02-08 DIAGNOSIS — E1149 Type 2 diabetes mellitus with other diabetic neurological complication: Secondary | ICD-10-CM | POA: Diagnosis not present

## 2018-02-08 DIAGNOSIS — R609 Edema, unspecified: Secondary | ICD-10-CM

## 2018-02-08 DIAGNOSIS — R202 Paresthesia of skin: Secondary | ICD-10-CM | POA: Diagnosis not present

## 2018-02-08 DIAGNOSIS — Z789 Other specified health status: Secondary | ICD-10-CM | POA: Diagnosis not present

## 2018-02-08 DIAGNOSIS — R5383 Other fatigue: Secondary | ICD-10-CM

## 2018-02-08 DIAGNOSIS — Z758 Other problems related to medical facilities and other health care: Secondary | ICD-10-CM

## 2018-02-08 LAB — POCT GLYCOSYLATED HEMOGLOBIN (HGB A1C): HEMOGLOBIN A1C: 6.4 % — AB (ref 4.0–5.6)

## 2018-02-08 NOTE — Progress Notes (Signed)
Subjective: Chief Complaint  Patient presents with  . Follow-up    facial swelling, feet swelling sugar running 130 at night   Here with daughter and interpreter from [place]  Her main concern today leg swelling.  She gets this somewhat intermittent but somewhat regularly and when she gets leg swelling feels tight in the chest.  She is only taking Lasix daily although nephrology wanted her to take it twice daily.  She says she urinates too much so she does not take it once daily.  She tried a leg compression hose over-the-counter once and threw them away.  Not doing much for exercise.  She is avoiding salt and fast food and fried food.    She is taking Lantus 5 units daily, sugars have been improving since last visit when she was on the prednisone for allergic reaction.  Taking gabapentin daily at bedtime and it does help with numbness and tingling in the feet  She reports fatigue, but no chest pain, denies snoring, no witnessed apnea, no daytime somnolence  Past Medical History:  Diagnosis Date  . Diabetes mellitus without complication ([place])   . Hypertension   . IgA nephropathy 2019  . Language barrier   . Obesity   . Proteinuria   . Renal disorder    Current Outpatient Medications on File Prior to Visit  Medication Sig Dispense Refill  . amLODipine (NORVASC) 10 MG tablet Take 1 tablet (10 mg total) by mouth daily. 90 tablet 3  . cholecalciferol (VITAMIN D) 1000 units tablet Take 1 tablet (1,000 Units total) by mouth daily. 90 tablet 3  . cyclophosphamide (CYTOXAN) 50 MG capsule Take 100 mg by mouth daily. Give on an empty stomach 1 hour before or 2 hours after meals.    Marland Kitchen EPINEPHrine 0.3 mg/0.3 mL IJ SOAJ injection Only use during anaphylaxis 1 Device 0  . furosemide (LASIX) 40 MG tablet Take only after a week after doing a BMP test 90 tablet 3  . gabapentin (NEURONTIN) 100 MG capsule Take 1 capsule (100 mg total) by mouth at bedtime. 30 capsule 11  . hydrALAZINE  (APRESOLINE) 10 MG tablet Take 1 tablet (10 mg total) by mouth 3 (three) times daily. 90 tablet 0  . hydrocortisone (ANUSOL-HC) 2.5 % rectal cream Place 1 application rectally 2 (two) times daily. 30 g 0  . Insulin Glargine (BASAGLAR KWIKPEN) 100 UNIT/ML SOPN Inject 0.53 mLs (53 Units total) into the skin at bedtime. 3 mL 11  . insulin glargine (LANTUS) 100 unit/mL SOPN Inject 0.15 mLs (15 Units total) into the skin at bedtime. Begin with 5 units at bedtime.   Can increase 2 units weekly until morning glucose consistenly <130 15 mL 5  . Insulin Pen Needle 32G X 4 MM MISC 1 each by Does not apply route at bedtime. 100 each 11  . levothyroxine (SYNTHROID) 50 MCG tablet Take 1 tablet (50 mcg total) by mouth daily. 30 tablet 11  . omeprazole (PRILOSEC) 40 MG capsule Take 1 capsule (40 mg total) by mouth daily. 90 capsule 3  . ondansetron (ZOFRAN) 4 MG tablet Take 1 tablet (4 mg total) by mouth every 8 (eight) hours as needed for nausea or vomiting. 20 tablet 0  . potassium chloride (KLOR-CON 10) 10 MEQ tablet Take 1 tablet (10 mEq total) by mouth 2 (two) times daily. 180 tablet 3  . promethazine (PHENERGAN) 25 MG tablet 1/2/-1 tablet po q6hr prn nausea (Patient taking differently: Take 12.5-25 mg by mouth every 6 (six)  hours as needed for nausea. ) 20 tablet 0  . rosuvastatin (CRESTOR) 20 MG tablet Take 1 tablet (20 mg total) by mouth daily. (Patient taking differently: Take 40 mg by mouth daily. ) 90 tablet 3  . diphenhydrAMINE (BENADRYL) 25 mg capsule Take 1 capsule (25 mg total) by mouth every 12 (twelve) hours for 5 days. 10 capsule 0  . famotidine (PEPCID) 20 MG tablet Take 1 tablet (20 mg total) by mouth 2 (two) times daily for 5 days. 10 tablet 0   Current Facility-Administered Medications on File Prior to Visit  Medication Dose Route Frequency Provider Last Rate Last Dose  . promethazine (PHENERGAN) injection 50 mg  50 mg Intramuscular Q6H PRN Carlena Hurl, PA-C   50 mg at 09/25/17 1215    ROS as in subjective   Objective: BP 130/80   Pulse 75   Temp 97.9 F (36.6 C) (Oral)   Resp 16   Ht 5\' 1"  (1.549 m)   Wt 172 lb 3.2 oz (78.1 kg)   SpO2 99%   BMI 32.54 kg/m   General appearance: alert, no distress, WD/WN, well appearing Oral cavity: MMM, no lesions Neck: supple, no lymphadenopathy, no thyromegaly, no masses, no JVD Heart: RRR, normal S1, S2, no murmurs Lungs: CTA bilaterally, no wheezes, rhonchi, or rales Pulses: 2+ symmetric, upper and lower extremities, normal cap refill Ext: 1+ nonpitting minimal edema today bilat LE, no calve tenderness or palpable cord    Assessment: Encounter Diagnoses  Name Primary?  . Edema, unspecified type Yes  . Type 2 diabetes mellitus with neurological complications ([place])   . IgA nephropathy   . Hypokalemia   . Language barrier   . Paresthesia   . Other fatigue     Plan: Edema-reviewed her recent labs, will check a BMP today, discussed possible causes swelling and she has had recent elevated sugars been on prednisone.  Although nephrology want her to be on Lasix 40 twice daily she is only using it once daily.  We discussed trying to increase to at least adding a 20 mg in the afternoon, we discussed trying over-the-counter compression hose again, walking for exercise, elevating the legs in the evening  Fatigue-consider sleep study   Paresthesias-improved on current medication gabapentin at bedtime  Diabetes-hemoglobin A1c at goal today, continue Lantus 5 units daily  Himani was seen today for follow-up.  Diagnoses and all orders for this visit:  Edema, unspecified type -     Brain natriuretic peptide  Type 2 diabetes mellitus with neurological complications ([facility]) -     POCT A1C  IgA nephropathy  Hypokalemia  Language barrier  Paresthesia  Other fatigue

## 2018-02-08 NOTE — Patient Instructions (Signed)
Recommendations:  I recommend you take Lasix 40 mg once in the morning and at least one half tablet in the afternoon  Consider over-the-counter compression hose, elevate the leg in the evening, walk for exercise  You can continue Lantus 5 units nightly, if your morning sugars average over 130 you can go up 2 units/week of the Lantus  We are checking labs today called the BNP heart marker.  If this is normal we might want to consider a sleep study to check for sleep apnea  Continue plan to see kidney doctor soon

## 2018-02-09 LAB — BRAIN NATRIURETIC PEPTIDE: BNP: 16.5 pg/mL (ref 0.0–100.0)

## 2018-03-24 DIAGNOSIS — Z0279 Encounter for issue of other medical certificate: Secondary | ICD-10-CM

## 2018-04-13 ENCOUNTER — Ambulatory Visit: Payer: Medicaid Other | Admitting: Medical

## 2018-04-13 VITALS — BP 144/90 | HR 76 | Temp 97.9°F | Resp 16 | Ht 61.0 in | Wt 174.6 lb

## 2018-04-13 DIAGNOSIS — R7989 Other specified abnormal findings of blood chemistry: Secondary | ICD-10-CM

## 2018-04-13 DIAGNOSIS — R609 Edema, unspecified: Secondary | ICD-10-CM

## 2018-04-13 DIAGNOSIS — R202 Paresthesia of skin: Secondary | ICD-10-CM

## 2018-04-13 DIAGNOSIS — E1149 Type 2 diabetes mellitus with other diabetic neurological complication: Secondary | ICD-10-CM | POA: Diagnosis not present

## 2018-04-13 DIAGNOSIS — Z23 Encounter for immunization: Secondary | ICD-10-CM | POA: Diagnosis not present

## 2018-04-13 DIAGNOSIS — E559 Vitamin D deficiency, unspecified: Secondary | ICD-10-CM

## 2018-04-13 DIAGNOSIS — I10 Essential (primary) hypertension: Secondary | ICD-10-CM

## 2018-04-13 DIAGNOSIS — N028 Recurrent and persistent hematuria with other morphologic changes: Secondary | ICD-10-CM | POA: Diagnosis not present

## 2018-04-13 DIAGNOSIS — E876 Hypokalemia: Secondary | ICD-10-CM

## 2018-04-13 DIAGNOSIS — Z79899 Other long term (current) drug therapy: Secondary | ICD-10-CM

## 2018-04-13 NOTE — Progress Notes (Signed)
Subjective: Chief Complaint  Patient presents with  . swollen\    swollen legs, face X 3 weeks   Here today with her daughter and interpreter from [employer] that helps interpret  Her main concern like prior visits is swelling in general, face, eyelids, legs.  No itching, no sneezing, no rash.   She feels like the feet swelling contributes to her feet pain and numbness in her feet.  She denies SOB, wheezing, chest pain.  Denies using a lot of salt, cooks her food at home, no eating fast food.    Blood pressure- from what I can tell through the interpreter and her daughter, she ran out of hydralazine and Lasix, is taking amlodipine 10 mg twice daily, and apparently is no longer on Cytoxan.  She states nephrology stopped Cytoxan last visit.  She does wear her compression hose and elevates her legs  Diabetes-compliant with lantus 5u.  Morning glucose is always under 100 but in the evening her sugars run up in the 160 range sometimes.  Last visit with her kidney doctor was in July at which time some medications were changed.  She is not sure when her last Pap smear or pelvic exam was  Hyperlipidemia-compliant with Crestor  Thyroid disease-compliant with her thyroid medicine daily in the morning   Past Medical History:  Diagnosis Date  . Diabetes mellitus without complication ([place])   . Hypertension   . IgA nephropathy 2019  . Language barrier   . Obesity   . Proteinuria   . Renal disorder    Current Outpatient Medications on File Prior to Visit  Medication Sig Dispense Refill  . amLODipine (NORVASC) 10 MG tablet Take 1 tablet (10 mg total) by mouth daily. 90 tablet 3  . cholecalciferol (VITAMIN D) 1000 units tablet Take 1 tablet (1,000 Units total) by mouth daily. 90 tablet 3  . EPINEPHrine 0.3 mg/0.3 mL IJ SOAJ injection Only use during anaphylaxis 1 Device 0  . furosemide (LASIX) 40 MG tablet Take only after a week after doing a BMP test 90 tablet 3  . gabapentin  (NEURONTIN) 100 MG capsule Take 1 capsule (100 mg total) by mouth at bedtime. 30 capsule 11  . Insulin Glargine (BASAGLAR KWIKPEN) 100 UNIT/ML SOPN Inject 0.53 mLs (53 Units total) into the skin at bedtime. 3 mL 11  . insulin glargine (LANTUS) 100 unit/mL SOPN Inject 0.15 mLs (15 Units total) into the skin at bedtime. Begin with 5 units at bedtime.   Can increase 2 units weekly until morning glucose consistenly <130 15 mL 5  . Insulin Pen Needle 32G X 4 MM MISC 1 each by Does not apply route at bedtime. 100 each 11  . levothyroxine (SYNTHROID) 50 MCG tablet Take 1 tablet (50 mcg total) by mouth daily. 30 tablet 11  . omeprazole (PRILOSEC) 40 MG capsule Take 1 capsule (40 mg total) by mouth daily. 90 capsule 3  . rosuvastatin (CRESTOR) 20 MG tablet Take 1 tablet (20 mg total) by mouth daily. (Patient taking differently: Take 40 mg by mouth daily. ) 90 tablet 3  . cyclophosphamide (CYTOXAN) 50 MG capsule Take 100 mg by mouth daily. Give on an empty stomach 1 hour before or 2 hours after meals.    . diphenhydrAMINE (BENADRYL) 25 mg capsule Take 1 capsule (25 mg total) by mouth every 12 (twelve) hours for 5 days. 10 capsule 0  . famotidine (PEPCID) 20 MG tablet Take 1 tablet (20 mg total) by mouth 2 (two) times  daily for 5 days. 10 tablet 0  . hydrALAZINE (APRESOLINE) 10 MG tablet Take 1 tablet (10 mg total) by mouth 3 (three) times daily. (Patient not taking: Reported on 04/13/2018) 90 tablet 0  . hydrocortisone (ANUSOL-HC) 2.5 % rectal cream Place 1 application rectally 2 (two) times daily. (Patient not taking: Reported on 04/13/2018) 30 g 0  . ondansetron (ZOFRAN) 4 MG tablet Take 1 tablet (4 mg total) by mouth every 8 (eight) hours as needed for nausea or vomiting. (Patient not taking: Reported on 04/13/2018) 20 tablet 0  . potassium chloride (KLOR-CON 10) 10 MEQ tablet Take 1 tablet (10 mEq total) by mouth 2 (two) times daily. (Patient not taking: Reported on 04/13/2018) 180 tablet 3  . promethazine  (PHENERGAN) 25 MG tablet 1/2/-1 tablet po q6hr prn nausea (Patient not taking: Reported on 04/13/2018) 20 tablet 0   Current Facility-Administered Medications on File Prior to Visit  Medication Dose Route Frequency Provider Last Rate Last Dose  . promethazine (PHENERGAN) injection 50 mg  50 mg Intramuscular Q6H PRN Carlena Hurl, PA-C   50 mg at 09/25/17 1215   ROS as in subjective    Objective BP (!) 144/90   Pulse 76   Temp 97.9 F (36.6 C) (Oral)   Resp 16   Ht 5\' 1"  (1.549 m)   Wt 174 lb 9.6 oz (79.2 kg)   SpO2 98%   BMI 32.99 kg/m    General appearence: alert, no distress, WD/WN Round face, somewhat puffy around cheeks and eyelids HEENT: normocephalic, sclerae anicteric, TMs pearly, nares patent, no discharge or erythema, pharynx normal Oral cavity: MMM, no lesions Neck: supple, no lymphadenopathy, no thyromegaly, no masses, no bruits Heart: RRR, normal S1, S2, no murmurs Lungs: CTA bilaterally, no wheezes, rhonchi, or rales Abdomen: +bs, soft, non tender, non distended, no masses, no hepatomegaly, no splenomegaly Pulses: 2+ symmetric, upper and lower extremities, normal cap refill Ext: 1+ nonpitting edema bilat, no palpable cord or tendnerss of calves    Assessment: Encounter Diagnoses  Name Primary?  . Essential hypertension Yes  . Type 2 diabetes mellitus with neurological complications ([place])   . IgA nephropathy   . Abnormal thyroid blood test   . Hypokalemia   . High risk medication use   . Vitamin D deficiency   . Paresthesia   . Need for influenza vaccination   . Edema, unspecified type      Plan: We discussed her swelling that we have discussed the last several visits.  We discussed the possible differential that could cause swelling.  I still suspect this is related to her kidney disease, possibly thyroid and blood sugar control can be playing a role.  We discussed other causes that may be harder to determine.  Is not apparent that she has an  allergy issue at this point.  At her last visit in July we checked a BNP which was normal, we discussed the possibility of venous stasis disease, cannot rule out mass or other tumor.  I advised she let nephrology manage her blood pressure medications and kidney disease, and she has follow-up there soon  Counseled on the influenza virus vaccine.  Vaccine information sheet given.  Influenza vaccine given after consent obtained.  We discussed the following recommendations and clarification of medications   Recommendations: Follow-up with your kidney doctor as planned  Make sure you talk to your kidney doctor about swelling in about clearing up any confusion about your medications  Schedule an appointment for Pap  smear and gynecological exam.  This can be either with me, with [place] nurse practitioner here or if she prefers to see a gynecologist we can refer to female gynecologist  We updated her flu shot today    Her main concern continues to be swelling.  There are many potential causes for this including kidney disease, heart disease, medication side effect, foods that can contribute, thyroid disease, tumors or pelvic mass, poor blood flow in the legs called venous disease, high blood sugar, and other causes.  I suspect that her case is a combination of thyroid and kidney disease issues.    The following medicines are prescribed by me here at [facility] family medicine  Diabetes  Lantus nightly insulin injection, currently at 5 units per night  High cholesterol  Rosuvastatin/Crestor 20 mg daily at bedtime  Numbness and pains in the feet called neuropathy or paresthesias  Gabapentin 100 mg capsule daily at bedtime  Thyroid disease  Levothyroxine 50 mcg daily in the morning an hour before breakfast on empty stomach  Acid reflux medicine  Omeprazole/Prilosec and famotidine/Pepcid  These can be taken either as needed or daily depending on how bad the problem you have a  reflux  Vitamin D deficiency  Vitamin D 1000 units daily  For nausea  Zofran 4 mg daily as needed every 8 hours  Or for worse nausea, promethazine 25 mg as needed every 6 hours but this can cause drowsiness    Your current blood pressure medications according to my list are as below.  However this may not be accurate if your kidney doctor changed medications back in July.  Primarily let your kidney doctor control your blood pressure medications and kidney medications  Prior to July I am showing the following: Amlodipine 10 mg once daily Lasix/furosemide 40 mg 1 tablet twice daily Hydralazine 10 mg 3 times daily    Lunah was seen today for swollen  Diagnoses and all orders for this visit:  Essential hypertension -     Renal Function Panel -     CBC with Differential/Platelet -     Cortisol  Type 2 diabetes mellitus with neurological complications (HCC) -     Hepatic function panel  IgA nephropathy -     Renal Function Panel -     Hepatic function panel  Abnormal thyroid blood test -     TSH -     T4, free  Hypokalemia  High risk medication use  Vitamin D deficiency  Paresthesia -     Renal Function Panel -     Hepatic function panel -     CBC with Differential/Platelet -     Cortisol -     TSH -     T4, free  Need for influenza vaccination  Edema, unspecified type -     Renal Function Panel -     Hepatic function panel -     TSH -     T4, free

## 2018-04-13 NOTE — Patient Instructions (Signed)
  Counseled on the influenza virus vaccine.  Vaccine information sheet given.  Influenza vaccine given after consent obtained.   Recommendations: Follow-up with your kidney doctor as planned  Make sure you talk to your kidney doctor about swelling in about clearing up any confusion about your medications  Schedule an appointment for Pap smear and gynecological exam.  This can be either with me, with [place] nurse practitioner here or if she prefers to see a gynecologist we can refer to female gynecologist  We updated her flu shot today   Her main concern continues to be swelling.  There are many potential causes for this including kidney disease, heart disease, medication side effect, foods that can contribute, thyroid disease, tumors or pelvic mass, poor blood flow in the legs called venous disease, high blood sugar, and other causes.  I suspect that her case is a combination of thyroid and kidney disease issues.    The following medicines are prescribed by me here at [facility] family medicine  Diabetes  Lantus nightly insulin injection, currently at 5 units per night  High cholesterol  Rosuvastatin/Crestor 20 mg daily at bedtime  Numbness and pains in the feet called neuropathy or paresthesias  Gabapentin 100 mg capsule daily at bedtime  Thyroid disease  Levothyroxine 50 mcg daily in the morning an hour before breakfast on empty stomach  Acid reflux medicine  Omeprazole/Prilosec and famotidine/Pepcid  These can be taken either as needed or daily depending on how bad the problem you have a reflux  Vitamin D deficiency  Vitamin D 1000 units daily  For nausea  Zofran 4 mg daily as needed every 8 hours  Or for worse nausea, promethazine 25 mg as needed every 6 hours but this can cause drowsiness    Your current blood pressure medications according to my list are as below.  However this may not be accurate if your kidney doctor changed medications back in  July.  Primarily let your kidney doctor control your blood pressure medications and kidney medications  Prior to July I am showing the following: Amlodipine 10 mg once daily Lasix/furosemide 40 mg 1 tablet twice daily Hydralazine 10 mg 3 times daily

## 2018-04-14 ENCOUNTER — Other Ambulatory Visit: Payer: Self-pay

## 2018-04-14 ENCOUNTER — Other Ambulatory Visit: Payer: Self-pay | Admitting: Medical

## 2018-04-14 DIAGNOSIS — E785 Hyperlipidemia, unspecified: Secondary | ICD-10-CM

## 2018-04-14 LAB — T4, FREE: Free T4: 1.2 ng/dL (ref 0.82–1.77)

## 2018-04-14 LAB — CBC WITH DIFFERENTIAL/PLATELET
Basophils Absolute: 0 10*3/uL (ref 0.0–0.2)
Basos: 0 %
EOS (ABSOLUTE): 0.5 10*3/uL — ABNORMAL HIGH (ref 0.0–0.4)
EOS: 7 %
HEMATOCRIT: 39.1 % (ref 34.0–46.6)
HEMOGLOBIN: 12.9 g/dL (ref 11.1–15.9)
Immature Grans (Abs): 0 10*3/uL (ref 0.0–0.1)
Immature Granulocytes: 0 %
LYMPHS ABS: 2.2 10*3/uL (ref 0.7–3.1)
LYMPHS: 32 %
MCH: 30.6 pg (ref 26.6–33.0)
MCHC: 33 g/dL (ref 31.5–35.7)
MCV: 93 fL (ref 79–97)
MONOCYTES: 6 %
Monocytes Absolute: 0.4 10*3/uL (ref 0.1–0.9)
Neutrophils Absolute: 3.7 10*3/uL (ref 1.4–7.0)
Neutrophils: 55 %
Platelets: 248 10*3/uL (ref 150–450)
RBC: 4.21 x10E6/uL (ref 3.77–5.28)
RDW: 12.6 % (ref 12.3–15.4)
WBC: 6.7 10*3/uL (ref 3.4–10.8)

## 2018-04-14 LAB — HEPATIC FUNCTION PANEL
ALT: 47 IU/L — ABNORMAL HIGH (ref 0–32)
AST: 31 IU/L (ref 0–40)
Alkaline Phosphatase: 99 IU/L (ref 39–117)
BILIRUBIN TOTAL: 0.5 mg/dL (ref 0.0–1.2)
BILIRUBIN, DIRECT: 0.12 mg/dL (ref 0.00–0.40)
Total Protein: 7.4 g/dL (ref 6.0–8.5)

## 2018-04-14 LAB — RENAL FUNCTION PANEL
ALBUMIN: 4.5 g/dL (ref 3.5–5.5)
BUN/Creatinine Ratio: 17 (ref 9–23)
BUN: 27 mg/dL — ABNORMAL HIGH (ref 6–24)
CHLORIDE: 102 mmol/L (ref 96–106)
CO2: 21 mmol/L (ref 20–29)
Calcium: 10 mg/dL (ref 8.7–10.2)
Creatinine, Ser: 1.57 mg/dL — ABNORMAL HIGH (ref 0.57–1.00)
GFR calc non Af Amer: 39 mL/min/{1.73_m2} — ABNORMAL LOW (ref >59)
GFR, EST AFRICAN AMERICAN: 45 mL/min/{1.73_m2} — AB (ref >59)
GLUCOSE: 71 mg/dL (ref 65–99)
PHOSPHORUS: 4.3 mg/dL (ref 2.5–4.5)
POTASSIUM: 3.8 mmol/L (ref 3.5–5.2)
SODIUM: 142 mmol/L (ref 134–144)

## 2018-04-14 LAB — TSH: TSH: 0.343 u[IU]/mL — AB (ref 0.450–4.500)

## 2018-04-14 LAB — CORTISOL: CORTISOL: 10.8 ug/dL

## 2018-04-14 MED ORDER — LEVOTHYROXINE SODIUM 25 MCG PO TABS: 25.0000 ug | ORAL_TABLET | Freq: Every day | ORAL | 1 refills | Status: DC

## 2018-04-15 LAB — HEPATITIS C ANTIBODY: Hep C Virus Ab: <0.1 s/co ratio (ref 0.0–0.9)

## 2018-04-15 LAB — SPECIMEN STATUS REPORT

## 2018-04-15 LAB — HEPATITIS B SURFACE ANTIGEN: Hepatitis B Surface Ag: NEGATIVE

## 2018-04-30 ENCOUNTER — Telehealth: Payer: Self-pay

## 2018-04-30 NOTE — Telephone Encounter (Signed)
Patient has appointment on 06-07-18 at 10 am at [facility].  Patient's daughter notified of appointment.

## 2018-05-17 ENCOUNTER — Other Ambulatory Visit (HOSPITAL_COMMUNITY)
Admission: RE | Admit: 2018-05-17 | Discharge: 2018-05-17 | Disposition: A | Discharge status: Home / Self Care | Payer: Medicaid Other | Source: Ambulatory Visit | Attending: Medical | Admitting: Medical

## 2018-05-17 ENCOUNTER — Ambulatory Visit: Payer: Medicaid Other | Admitting: Medical

## 2018-05-17 ENCOUNTER — Encounter: Payer: Self-pay | Admitting: Medical

## 2018-05-17 VITALS — BP 140/94 | HR 64 | Temp 97.7°F | Resp 16 | Ht 61.0 in | Wt 178.4 lb

## 2018-05-17 DIAGNOSIS — Z124 Encounter for screening for malignant neoplasm of cervix: Secondary | ICD-10-CM | POA: Insufficient documentation

## 2018-05-17 DIAGNOSIS — K760 Fatty (change of) liver, not elsewhere classified: Secondary | ICD-10-CM

## 2018-05-17 DIAGNOSIS — E1149 Type 2 diabetes mellitus with other diabetic neurological complication: Secondary | ICD-10-CM

## 2018-05-17 DIAGNOSIS — Z1239 Encounter for other screening for malignant neoplasm of breast: Secondary | ICD-10-CM

## 2018-05-17 DIAGNOSIS — E039 Hypothyroidism, unspecified: Secondary | ICD-10-CM | POA: Insufficient documentation

## 2018-05-17 DIAGNOSIS — Z23 Encounter for immunization: Secondary | ICD-10-CM | POA: Diagnosis not present

## 2018-05-17 DIAGNOSIS — Z0000 Encounter for general adult medical examination without abnormal findings: Secondary | ICD-10-CM | POA: Diagnosis present

## 2018-05-17 DIAGNOSIS — R10817 Generalized abdominal tenderness: Secondary | ICD-10-CM

## 2018-05-17 DIAGNOSIS — N028 Recurrent and persistent hematuria with other morphologic changes: Secondary | ICD-10-CM

## 2018-05-17 DIAGNOSIS — Z789 Other specified health status: Secondary | ICD-10-CM

## 2018-05-17 DIAGNOSIS — Z7189 Other specified counseling: Secondary | ICD-10-CM

## 2018-05-17 DIAGNOSIS — E559 Vitamin D deficiency, unspecified: Secondary | ICD-10-CM

## 2018-05-17 DIAGNOSIS — K219 Gastro-esophageal reflux disease without esophagitis: Secondary | ICD-10-CM

## 2018-05-17 DIAGNOSIS — Z1211 Encounter for screening for malignant neoplasm of colon: Secondary | ICD-10-CM | POA: Insufficient documentation

## 2018-05-17 DIAGNOSIS — I10 Essential (primary) hypertension: Secondary | ICD-10-CM

## 2018-05-17 DIAGNOSIS — Z79899 Other long term (current) drug therapy: Secondary | ICD-10-CM

## 2018-05-17 DIAGNOSIS — R609 Edema, unspecified: Secondary | ICD-10-CM

## 2018-05-17 DIAGNOSIS — Z7185 Encounter for immunization safety counseling: Secondary | ICD-10-CM

## 2018-05-17 DIAGNOSIS — R809 Proteinuria, unspecified: Secondary | ICD-10-CM

## 2018-05-17 DIAGNOSIS — E785 Hyperlipidemia, unspecified: Secondary | ICD-10-CM

## 2018-05-17 LAB — POCT URINALYSIS DIP (PROADVANTAGE DEVICE)
BILIRUBIN UA: NEGATIVE mg/dL
Bilirubin, UA: NEGATIVE
Glucose, UA: NEGATIVE mg/dL
LEUKOCYTES UA: NEGATIVE
Nitrite, UA: NEGATIVE
Specific Gravity, Urine: 1.015
UUROB: NEGATIVE
pH, UA: 6 (ref 5.0–8.0)

## 2018-05-17 NOTE — Patient Instructions (Addendum)
Thank you for coming in for a physical  We updated your pneumonia shot today  We will try to get records from the health department to confirm whether your tetanus immunization is up-to-date  Call insurance to see if they cover colon cancer screening at this age. The 2 choices are Colonoscopy and Cologard A colonoscopy requires 2 visits with the gastrointestinal doctor, the second visit being the procedure where you are sedated  The Cologuard is a stool test to get shipped off in the mail  Exercise regularly such as walking or riding a bicycle 30 minutes 5 days/week  Call and schedule your mammogram.  Plan to do this every other year  We updated your Pap smear cancer screening today  See your eye doctor yearly for routine vision care.  Make sure they do a diabetic eye exam and send me a copy of the results  See your dentist yearly for routine dental care including hygiene visits twice yearly.  Call and schedule a follow-up with your kidney doctor Dr. Moshe Cipro with [facility]

## 2018-05-17 NOTE — Progress Notes (Signed)
Subjective:   HPI  Linda Black is a 47 y.o. female who presents for Chief Complaint  Patient presents with  . CPE    fasting CPE   pap   Here with adult daughter who interprets and interpreter from [employer].  Medical care team includes: Tysinger, Camelia Eng, PA-C here for primary care Dentist Eye doctor  Concerns: Having some pain in right upper abdomen, worse bending down.   No pain after eating, no nausea, no vomiting.    She is using 5 units of long-acting insulin daily, checking glucose, no complaints currently  There was questions about her medications today and refills.  She notes certain things have run out although my records says that she has refills at the pharmacy.  We will make sure she is got refills today but she needs to check with pharmacy as well  Reviewed their medical, surgical, family, social, medication, and allergy history and updated chart as appropriate.  Past Medical History:  Diagnosis Date  . Diabetes mellitus without complication ([place])   . Fatty liver 2019   elevated LFTs, negative for Hep B and Hep C 04/2018  . Hypertension   . Hypothyroidism 2019  . IgA nephropathy 2018   Dr. Corliss Parish  . Language barrier   . Obesity   . Proteinuria     Past Surgical History:  Procedure Laterality Date  . RENAL BIOPSY  2018   IgA nephropathy, Dr. Corliss Parish    Social History   Socioeconomic History  . Marital status: Married    Spouse name: Not on file  . Number of children: Not on file  . Years of education: Not on file  . Highest education level: Not on file  Occupational History  . Not on file  Social Needs  . Financial resource strain: Not on file  . Food insecurity:    Worry: Not on file    Inability: Not on file  . Transportation needs:    Medical: Not on file    Non-medical: Not on file  Tobacco Use  . Smoking status: Never Smoker  . Smokeless tobacco: Never Used  Substance and Sexual Activity  .  Alcohol use: No  . Drug use: No  . Sexual activity: Not on file  Lifestyle  . Physical activity:    Days per week: Not on file    Minutes per session: Not on file  . Stress: Not on file  Relationships  . Social connections:    Talks on phone: Not on file    Gets together: Not on file    Attends religious service: Not on file    Active member of club or organization: Not on file    Attends meetings of clubs or organizations: Not on file    Relationship status: Not on file  . Intimate partner violence:    Fear of current or ex partner: Not on file    Emotionally abused: Not on file    Physically abused: Not on file    Forced sexual activity: Not on file  Other Topics Concern  . Not on file  Social History Narrative  . Not on file    Family History  Problem Relation Age of Onset  . Diabetes Mother   . Hypertension Father      Current Outpatient Medications:  .  amLODipine (NORVASC) 10 MG tablet, Take 1 tablet (10 mg total) by mouth daily., Disp: 90 tablet, Rfl: 3 .  cholecalciferol (VITAMIN D) 1000  units tablet, Take 1 tablet (1,000 Units total) by mouth daily., Disp: 90 tablet, Rfl: 3 .  EPINEPHrine 0.3 mg/0.3 mL IJ SOAJ injection, Only use during anaphylaxis, Disp: 1 Device, Rfl: 0 .  furosemide (LASIX) 40 MG tablet, Take only after a week after doing a BMP test, Disp: 90 tablet, Rfl: 3 .  gabapentin (NEURONTIN) 100 MG capsule, Take 1 capsule (100 mg total) by mouth at bedtime., Disp: 30 capsule, Rfl: 11 .  Insulin Pen Needle 32G X 4 MM MISC, 1 each by Does not apply route at bedtime., Disp: 100 each, Rfl: 11 .  levothyroxine (SYNTHROID, LEVOTHROID) 25 MCG tablet, Take 1 tablet (25 mcg total) by mouth daily before breakfast., Disp: 30 tablet, Rfl: 1 .  omeprazole (PRILOSEC) 40 MG capsule, Take 1 capsule (40 mg total) by mouth daily., Disp: 90 capsule, Rfl: 3 .  rosuvastatin (CRESTOR) 20 MG tablet, Take 1 tablet (20 mg total) by mouth daily. (Patient taking differently: Take  40 mg by mouth daily. ), Disp: 90 tablet, Rfl: 3 .  cyclophosphamide (CYTOXAN) 50 MG capsule, Take 100 mg by mouth daily. Give on an empty stomach 1 hour before or 2 hours after meals., Disp: , Rfl:  .  diphenhydrAMINE (BENADRYL) 25 mg capsule, Take 1 capsule (25 mg total) by mouth every 12 (twelve) hours for 5 days., Disp: 10 capsule, Rfl: 0 .  famotidine (PEPCID) 20 MG tablet, Take 1 tablet (20 mg total) by mouth 2 (two) times daily for 5 days., Disp: 10 tablet, Rfl: 0 .  hydrALAZINE (APRESOLINE) 10 MG tablet, Take 1 tablet (10 mg total) by mouth 3 (three) times daily. (Patient not taking: Reported on 04/13/2018), Disp: 90 tablet, Rfl: 0 .  hydrocortisone (ANUSOL-HC) 2.5 % rectal cream, Place 1 application rectally 2 (two) times daily. (Patient not taking: Reported on 04/13/2018), Disp: 30 g, Rfl: 0 .  Insulin Glargine (BASAGLAR KWIKPEN) 100 UNIT/ML SOPN, Inject 0.53 mLs (53 Units total) into the skin at bedtime., Disp: 3 mL, Rfl: 11 .  insulin glargine (LANTUS) 100 unit/mL SOPN, Inject 0.15 mLs (15 Units total) into the skin at bedtime. Begin with 5 units at bedtime.   Can increase 2 units weekly until morning glucose consistenly <130, Disp: 15 mL, Rfl: 5 .  ondansetron (ZOFRAN) 4 MG tablet, Take 1 tablet (4 mg total) by mouth every 8 (eight) hours as needed for nausea or vomiting. (Patient not taking: Reported on 04/13/2018), Disp: 20 tablet, Rfl: 0 .  potassium chloride (KLOR-CON 10) 10 MEQ tablet, Take 1 tablet (10 mEq total) by mouth 2 (two) times daily. (Patient not taking: Reported on 04/13/2018), Disp: 180 tablet, Rfl: 3 .  promethazine (PHENERGAN) 25 MG tablet, 1/2/-1 tablet po q6hr prn nausea (Patient not taking: Reported on 04/13/2018), Disp: 20 tablet, Rfl: 0  Current Facility-Administered Medications:  .  promethazine (PHENERGAN) injection 50 mg, 50 mg, Intramuscular, Q6H PRN, Carlena Hurl, PA-C, 50 mg at 09/25/17 1215  Allergies  Allergen Reactions  . Lisinopril Swelling     Angioedema      Review of Systems Constitutional: -fever, -chills, -sweats, -unexpected weight change, -decreased appetite, -fatigue Allergy: -sneezing, -itching, -congestion Dermatology: -changing moles, --rash, -lumps ENT: -runny nose, -ear pain, -sore throat, -hoarseness, -sinus pain, -teeth pain, - ringing in ears, -hearing loss, -nosebleeds Cardiology: -chest pain, -palpitations, -swelling, -difficulty breathing when lying flat, -waking up short of breath Respiratory: -cough, -shortness of breath, -difficulty breathing with exercise or exertion, -wheezing, -coughing up blood Gastroenterology: +abdominal pain, -nausea, -vomiting, -  diarrhea, -constipation, -blood in stool, -changes in bowel movement, -difficulty swallowing or eating Hematology: -bleeding, -bruising  Musculoskeletal: -joint aches, -muscle aches, -joint swelling, -back pain, -neck pain, -cramping, -changes in gait Ophthalmology: denies vision changes, eye redness, itching, discharge Urology: -burning with urination, -difficulty urinating, -blood in urine, -urinary frequency, -urgency, -incontinence Neurology: -headache, -weakness, -tingling, -numbness, -memory loss, -falls, -dizziness Psychology: -depressed mood, -agitation, -sleep problems Breast/gyn: -breast tenderness, -discharge, -lumps, -vaginal discharge,- irregular periods, -heavy periods     Objective:  BP (!) 140/94   Pulse 64   Temp 97.7 F (36.5 C) (Oral)   Resp 16   Ht 5\' 1"  (1.549 m)   Wt 178 lb 6.4 oz (80.9 kg)   LMP 03/23/2017   SpO2 97%   BMI 33.71 kg/m    Wt Readings from Last 3 Encounters:  05/17/18 178 lb 6.4 oz (80.9 kg)  04/13/18 174 lb 9.6 oz (79.2 kg)  02/08/18 172 lb 3.2 oz (78.1 kg)   BP Readings from Last 3 Encounters:  05/17/18 (!) 140/94  04/13/18 (!) 144/90  02/08/18 130/80   General appearance: alert, no distress, WD/WN, Asian female Skin: right forearm distally just proximal to wrist posterior surface with 37mm raised  somewhat crusted lesions, scattered freckles, otherwise no worrisome lesions HEENT: normocephalic, conjunctiva/corneas normal, sclerae anicteric, PERRLA, EOMi, nares patent, no discharge or erythema, pharynx normal Oral cavity: MMM, tongue normal, teeth normal Neck: supple, no lymphadenopathy, no thyromegaly, no masses, normal ROM, no bruits Chest: non tender, normal shape and expansion Heart: RRR, normal S1, S2, no murmurs Lungs: CTA bilaterally, no wheezes, rhonchi, or rales Abdomen: +bs, soft, non tender, non distended, no masses, no hepatomegaly, no splenomegaly, no bruits Back: non tender, normal ROM, no scoliosis Musculoskeletal: upper extremities non tender, no obvious deformity, normal ROM throughout, lower extremities non tender, no obvious deformity, normal ROM throughout Extremities: no edema, no cyanosis, no clubbing Pulses: 2+ symmetric, upper and lower extremities, normal cap refill Neurological: alert, oriented x 3, CN2-12 intact, strength normal upper extremities and lower extremities, sensation normal throughout, DTRs 2+ throughout, no cerebellar signs, gait normal Psychiatric: normal affect, behavior normal, pleasant  Breast: nontender, no masses or lumps, no skin changes, no nipple discharge or inversion, no axillary lymphadenopathy Gyn: Normal external genitalia without lesions, vagina with normal mucosa, cervix with mild erythema, no cervical motion tenderness, no abnormal vaginal discharge.  Uterus and adnexa not enlarged, nontender, no masses.  Pap performed.  Exam chaperoned by nurse. Rectal: normal appearing    Assessment and Plan :   Encounter Diagnoses  Name Primary?  . Routine general medical examination at a health care facility Yes  . Need for influenza vaccination   . Essential hypertension   . Gastroesophageal reflux disease, esophagitis presence not specified   . Type 2 diabetes mellitus with neurological complications ([place])   . IgA nephropathy   .  Edema, unspecified type   . High risk medication use   . Hyperlipidemia, unspecified hyperlipidemia type   . Language barrier   . Morbid obesity ([place])   . Proteinuria, unspecified type   . Vitamin D deficiency   . Vaccine counseling   . Screening for breast cancer   . Screening for cervical cancer   . Screen for colon cancer   . Fatty liver   . Hypothyroidism, unspecified type   . Need for pneumococcal vaccination   . Generalized abdominal tenderness, rebound tenderness presence not specified     Physical exam - discussed and counseled on healthy  lifestyle, diet, exercise, preventative care, vaccinations, sick and well care, proper use of emergency dept and after hours care, and addressed their concerns.    Health screening: See your eye doctor yearly for routine vision care. See your dentist yearly for routine dental care including hygiene visits twice yearly.  Cancer screening  Colonoscopy:  Advised to check insurance coverage  Pap smear today  She will call and schedule her first mammogram, advise monthly self breast exam   Vaccinations: We will call and request last tetanus from the health department.  She came to this [place] in [place] Flu shot is up-to-date  We updated pneumococcal vaccine today.  Given her multiple health issues including IgA nephropathy diabetes and thyroid disease, we recommended both pneumococcal 13 and 23.  Counseled on the pneumococcal vaccine.  Vaccine information sheet given.  Pneumococcal vaccine Prevnar 13 given after consent obtained.   Acute issues discussed: Right abdominal tenderness- seems musculoskeletal related.  Advised  the symptoms would gradually resolve.  No other obvious etiology, no concern for gastric or urinary issue.  Separate significant chronic issues discussed: Advise she check with pharmacy about refills that are already on file  Continue glucose monitoring, can advise she call and schedule appointment with Dr.  Moshe Cipro right away as she is due  Nesha was seen today for cpe.  Diagnoses and all orders for this visit:  Routine general medical examination at a health care facility -     POCT Urinalysis DIP (Proadvantage Device) -     Hemoglobin A1c -     TSH -     VITAMIN D 25 Hydroxy (Vit-D Deficiency, Fractures) -     Cytology - PAP -     MM DIGITAL SCREENING BILATERAL; Future  Need for influenza vaccination -     Cancel: Flu Vaccine QUAD 6+ mos PF IM (Fluarix Quad PF)  Essential hypertension  Gastroesophageal reflux disease, esophagitis presence not specified  Type 2 diabetes mellitus with neurological complications (HCC) -     Hemoglobin A1c  IgA nephropathy  Edema, unspecified type  High risk medication use  Hyperlipidemia, unspecified hyperlipidemia type  Language barrier  Morbid obesity (HCC)  Proteinuria, unspecified type  Vitamin D deficiency -     VITAMIN D 25 Hydroxy (Vit-D Deficiency, Fractures)  Vaccine counseling  Screening for breast cancer -     MM DIGITAL SCREENING BILATERAL; Future  Screening for cervical cancer -     Cytology - PAP  Screen for colon cancer  Fatty liver  Hypothyroidism, unspecified type -     TSH  Need for pneumococcal vaccination -     Pneumococcal conjugate vaccine 13-valent  Generalized abdominal tenderness, rebound tenderness presence not specified   Follow-up pending labs, yearly for physical

## 2018-05-18 ENCOUNTER — Other Ambulatory Visit: Payer: Self-pay | Admitting: Medical

## 2018-05-18 LAB — CYTOLOGY - PAP
Diagnosis: NEGATIVE
HPV (WINDOPATH): NOT DETECTED

## 2018-05-18 LAB — HEMOGLOBIN A1C
Est. average glucose Bld gHb Est-mCnc: 114 mg/dL
HEMOGLOBIN A1C: 5.6 % (ref 4.8–5.6)

## 2018-05-18 LAB — VITAMIN D 25 HYDROXY (VIT D DEFICIENCY, FRACTURES): VIT D 25 HYDROXY: 28.5 ng/mL — AB (ref 30.0–100.0)

## 2018-05-18 LAB — TSH: TSH: 1.07 u[IU]/mL (ref 0.450–4.500)

## 2018-05-18 MED ORDER — ROSUVASTATIN CALCIUM 40 MG PO TABS: 40.0000 mg | ORAL_TABLET | Freq: Every day | ORAL | 3 refills | Status: DC

## 2018-05-18 MED ORDER — LEVOTHYROXINE SODIUM 25 MCG PO TABS: 25.0000 ug | ORAL_TABLET | Freq: Every day | ORAL | 11 refills | Status: DC

## 2018-05-18 MED ORDER — POTASSIUM CHLORIDE ER 10 MEQ PO TBCR: 10.0000 meq | EXTENDED_RELEASE_TABLET | Freq: Two times a day (BID) | ORAL | 3 refills | Status: DC

## 2018-05-18 MED ORDER — BASAGLAR KWIKPEN 100 UNIT/ML ~~LOC~~ SOPN: 5.0000 [IU] | PEN_INJECTOR | Freq: Every day | SUBCUTANEOUS | 11 refills | Status: DC

## 2018-05-20 ENCOUNTER — Other Ambulatory Visit: Payer: Self-pay | Admitting: Medical

## 2018-05-20 MED ORDER — VITAMIN D 1000 UNITS PO TABS: 2000.0000 [IU] | ORAL_TABLET | Freq: Every day | ORAL | 3 refills | Status: DC

## 2018-06-02 ENCOUNTER — Encounter (HOSPITAL_COMMUNITY): Payer: Self-pay | Admitting: Emergency Medicine

## 2018-06-02 ENCOUNTER — Emergency Department (HOSPITAL_COMMUNITY)
Admission: EM | Admit: 2018-06-02 | Discharge: 2018-06-02 | Disposition: A | Discharge status: Home / Self Care | Payer: Medicaid Other | Attending: Emergency Medicine | Admitting: Emergency Medicine

## 2018-06-02 DIAGNOSIS — E119 Type 2 diabetes mellitus without complications: Secondary | ICD-10-CM | POA: Diagnosis not present

## 2018-06-02 DIAGNOSIS — Z79899 Other long term (current) drug therapy: Secondary | ICD-10-CM | POA: Diagnosis not present

## 2018-06-02 DIAGNOSIS — R51 Headache: Secondary | ICD-10-CM | POA: Insufficient documentation

## 2018-06-02 DIAGNOSIS — R519 Headache, unspecified: Secondary | ICD-10-CM

## 2018-06-02 DIAGNOSIS — I160 Hypertensive urgency: Secondary | ICD-10-CM | POA: Diagnosis not present

## 2018-06-02 DIAGNOSIS — N189 Chronic kidney disease, unspecified: Secondary | ICD-10-CM | POA: Diagnosis not present

## 2018-06-02 DIAGNOSIS — I129 Hypertensive chronic kidney disease with stage 1 through stage 4 chronic kidney disease, or unspecified chronic kidney disease: Secondary | ICD-10-CM | POA: Diagnosis not present

## 2018-06-02 LAB — CBC WITH DIFFERENTIAL/PLATELET
ABS IMMATURE GRANULOCYTES: 0.02 10*3/uL (ref 0.00–0.07)
BASOS ABS: 0 10*3/uL (ref 0.0–0.1)
Basophils Relative: 0 %
Eosinophils Absolute: 0.5 10*3/uL (ref 0.0–0.5)
Eosinophils Relative: 7 %
HCT: 43 % (ref 36.0–46.0)
HEMOGLOBIN: 14.1 g/dL (ref 12.0–15.0)
Immature Granulocytes: 0 %
LYMPHS PCT: 28 %
Lymphs Abs: 2.1 10*3/uL (ref 0.7–4.0)
MCH: 28.8 pg (ref 26.0–34.0)
MCHC: 32.8 g/dL (ref 30.0–36.0)
MCV: 87.9 fL (ref 80.0–100.0)
MONO ABS: 0.5 10*3/uL (ref 0.1–1.0)
Monocytes Relative: 7 %
NEUTROS ABS: 4.3 10*3/uL (ref 1.7–7.7)
Neutrophils Relative %: 58 %
Platelets: 287 10*3/uL (ref 150–400)
RBC: 4.89 MIL/uL (ref 3.87–5.11)
RDW: 11.9 % (ref 11.5–15.5)
WBC: 7.5 10*3/uL (ref 4.0–10.5)
nRBC: 0 % (ref 0.0–0.2)

## 2018-06-02 LAB — BASIC METABOLIC PANEL
Anion gap: 9 (ref 5–15)
BUN: 36 mg/dL — AB (ref 6–20)
CHLORIDE: 101 mmol/L (ref 98–111)
CO2: 30 mmol/L (ref 22–32)
Calcium: 9.7 mg/dL (ref 8.9–10.3)
Creatinine, Ser: 1.87 mg/dL — ABNORMAL HIGH (ref 0.44–1.00)
GFR calc Af Amer: 36 mL/min — ABNORMAL LOW (ref >60)
GFR calc non Af Amer: 31 mL/min — ABNORMAL LOW (ref >60)
GLUCOSE: 80 mg/dL (ref 70–99)
POTASSIUM: 3.4 mmol/L — AB (ref 3.5–5.1)
SODIUM: 140 mmol/L (ref 135–145)

## 2018-06-02 LAB — CBG MONITORING, ED: Glucose-Capillary: 71 mg/dL (ref 70–99)

## 2018-06-02 MED ORDER — DIPHENHYDRAMINE HCL 50 MG/ML IJ SOLN
25.0000 mg | Freq: Once | INTRAMUSCULAR | Status: AC
  Administered 2018-06-02: 25 mg via INTRAVENOUS
  Filled 2018-06-02: qty 1

## 2018-06-02 MED ORDER — METOCLOPRAMIDE HCL 5 MG/ML IJ SOLN
10.0000 mg | Freq: Once | INTRAMUSCULAR | Status: AC
  Administered 2018-06-02: 10 mg via INTRAVENOUS
  Filled 2018-06-02: qty 2

## 2018-06-02 MED ORDER — HYDRALAZINE HCL 10 MG PO TABS
10.0000 mg | ORAL_TABLET | Freq: Once | ORAL | Status: AC
  Administered 2018-06-02: 10 mg via ORAL
  Filled 2018-06-02: qty 1

## 2018-06-02 MED ORDER — POTASSIUM CHLORIDE CRYS ER 20 MEQ PO TBCR: 20.0000 meq | EXTENDED_RELEASE_TABLET | Freq: Once | ORAL | Status: DC

## 2018-06-02 MED ORDER — AMLODIPINE BESYLATE 5 MG PO TABS
10.0000 mg | ORAL_TABLET | Freq: Once | ORAL | Status: AC
  Administered 2018-06-02: 10 mg via ORAL
  Filled 2018-06-02: qty 2

## 2018-06-02 MED ORDER — SODIUM CHLORIDE 0.9 % IV BOLUS
1000.0000 mL | Freq: Once | INTRAVENOUS | Status: AC
  Administered 2018-06-02: 1000 mL via INTRAVENOUS

## 2018-06-02 NOTE — ED Notes (Signed)
ED Provider at bedside. 

## 2018-06-02 NOTE — Discharge Instructions (Addendum)
If your headache worsens, you develop recurrent dizziness, blurred vision, or if you develop chest pain, shortness of breath, confusion, or any other new/concerning symptoms or return to the ER for evaluation.  Otherwise follow-up with your primary care physician.  Be sure to take your blood pressure medicines as prescribed.

## 2018-06-02 NOTE — ED Notes (Signed)
Bed: [facility]PT4 Expected date:  Expected time:  Means of arrival:  Comments: 

## 2018-06-02 NOTE — ED Provider Notes (Signed)
[facility] DEPT Provider Note   CSN: [id number] Arrival date & time: 06/02/18  1329     History   Chief Complaint Chief Complaint  Patient presents with  . Hypertension  . Headache    HPI Linda Black is a 47 y.o. female.  HPI  47 year old female presents with headache.  History is taken with the assistance of the daughter due to language barrier.  Patient has been out of her blood pressure medicine for the last 3 or 4 days.  She is due to pick them up today however the patient started having headache so she came in here instead.  The patient started to develop a mild headache last night and has progressively worsened.  It is right occipital.  She vomited once today.  She is been having some blurry vision bilaterally as well as dizziness.  There is no focal weakness or numbness she denies chest pain or shortness of breath.  This is typical of whenever she runs out of her blood pressure medicine, she always gets a headache similar to this.  She rates it as a 10/10.  Past Medical History:  Diagnosis Date  . Diabetes mellitus without complication ([place])   . Fatty liver 2019   elevated LFTs, negative for Hep B and Hep C 04/2018  . Hypertension   . Hypothyroidism 2019  . IgA nephropathy 2018   Dr. Corliss Parish  . Language barrier   . Obesity   . Proteinuria     Patient Active Problem List   Diagnosis Date Noted  . Screen for colon cancer 05/17/2018  . Vaccine counseling 05/17/2018  . Fatty liver 05/17/2018  . Screening for cervical cancer 05/17/2018  . Hypothyroidism 05/17/2018  . Need for pneumococcal vaccination 05/17/2018  . Generalized abdominal tenderness 05/17/2018  . Need for influenza vaccination 04/13/2018  . Other fatigue 02/08/2018  . Edema 01/25/2018  . Renal failure (ARF), acute on chronic (HCC) 01/20/2018  . Normocytic normochromic anemia 01/20/2018  . Tremor 01/05/2018  . Vitamin D deficiency 01/05/2018  . Hemorrhoids  10/27/2017  . High risk medication use 09/25/2017  . Type 2 diabetes mellitus with neurological complications ([place]) 02/77/4128  . Proteinuria 09/02/2017  . IgA nephropathy 06/10/2017  . Language barrier 06/10/2017  . Gastroesophageal reflux disease 06/10/2017  . SOB (shortness of breath) 02/24/2017  . Hyperlipidemia 04/21/2016  . Bilateral low back pain without sciatica 01/29/2016  . Morbid obesity ([place]) 01/29/2016  . Hypokalemia 01/29/2016  . Essential hypertension 01/29/2016    Past Surgical History:  Procedure Laterality Date  . RENAL BIOPSY  2018   IgA nephropathy, Dr. Corliss Parish     OB History   None      Home Medications    Prior to Admission medications   Medication Sig Start Date End Date Taking? Authorizing Provider  amLODipine (NORVASC) 10 MG tablet Take 1 tablet (10 mg total) by mouth daily. 01/26/18  Yes Tysinger, Camelia Eng, PA-C  cholecalciferol (VITAMIN D) 1000 units tablet Take 2 tablets (2,000 Units total) by mouth daily. 05/20/18  Yes Tysinger, Camelia Eng, PA-C  EPINEPHrine 0.3 mg/0.3 mL IJ SOAJ injection Only use during anaphylaxis 01/22/18  Yes Adhikari, Tamsen Meek, MD  furosemide (LASIX) 40 MG tablet Take only after a week after doing a BMP test 01/22/18  Yes Adhikari, Tamsen Meek, MD  gabapentin (NEURONTIN) 100 MG capsule Take 1 capsule (100 mg total) by mouth at bedtime. 01/06/18 01/06/19 Yes Tysinger, Camelia Eng, PA-C  Insulin Glargine [facility])  100 UNIT/ML SOPN Inject 0.05 mLs (5 Units total) into the skin at bedtime. 05/18/18  Yes Tysinger, Camelia Eng, PA-C  Insulin Pen Needle 32G X 4 MM MISC 1 each by Does not apply route at bedtime. 01/06/18  Yes Tysinger, Camelia Eng, PA-C  levothyroxine (SYNTHROID, LEVOTHROID) 25 MCG tablet Take 1 tablet (25 mcg total) by mouth daily before breakfast. 05/18/18  Yes Tysinger, Camelia Eng, PA-C  omeprazole (PRILOSEC) 40 MG capsule Take 1 capsule (40 mg total) by mouth daily. 09/02/17  Yes Tysinger, Camelia Eng, PA-C  potassium chloride  (KLOR-CON 10) 10 MEQ tablet Take 1 tablet (10 mEq total) by mouth 2 (two) times daily. 05/18/18  Yes Tysinger, Camelia Eng, PA-C  rosuvastatin (CRESTOR) 40 MG tablet Take 1 tablet (40 mg total) by mouth daily. 05/18/18  Yes Tysinger, Camelia Eng, PA-C  diphenhydrAMINE (BENADRYL) 25 mg capsule Take 1 capsule (25 mg total) by mouth every 12 (twelve) hours for 5 days. 01/22/18 01/27/18  Shelly Coss, MD  hydrALAZINE (APRESOLINE) 10 MG tablet Take 1 tablet (10 mg total) by mouth 3 (three) times daily. Patient not taking: Reported on 04/13/2018 01/22/18   Shelly Coss, MD  hydrocortisone (ANUSOL-HC) 2.5 % rectal cream Place 1 application rectally 2 (two) times daily. Patient not taking: Reported on 04/13/2018 10/27/17   Tysinger, Camelia Eng, PA-C  ondansetron (ZOFRAN) 4 MG tablet Take 1 tablet (4 mg total) by mouth every 8 (eight) hours as needed for nausea or vomiting. Patient not taking: Reported on 04/13/2018 10/27/17   Carlena Hurl, PA-C  promethazine (PHENERGAN) 25 MG tablet 1/2/-1 tablet po q6hr prn nausea Patient not taking: Reported on 04/13/2018 10/27/17   Tysinger, Camelia Eng, PA-C    Family History Family History  Problem Relation Age of Onset  . Diabetes Mother   . Hypertension Father     Social History Social History   Tobacco Use  . Smoking status: Never Smoker  . Smokeless tobacco: Never Used  Substance Use Topics  . Alcohol use: No  . Drug use: No     Allergies   Lisinopril   Review of Systems Review of Systems  Eyes: Positive for visual disturbance.  Respiratory: Negative for shortness of breath.   Cardiovascular: Negative for chest pain.  Gastrointestinal: Positive for vomiting. Negative for abdominal pain.  Neurological: Positive for dizziness and headaches. Negative for weakness and numbness.  All other systems reviewed and are negative.    Physical Exam Updated Vital Signs BP (!) 227/113 (BP Location: Left Arm)   Pulse 64   Temp 97.9 F (36.6 C) (Oral)   Resp  18   LMP 03/23/2017   SpO2 98%   Physical Exam  Constitutional: She appears well-developed and well-nourished.  Non-toxic appearance. She does not appear ill. No distress.  HENT:  Head: Normocephalic and atraumatic.  Right Ear: External ear normal.  Left Ear: External ear normal.  Nose: Nose normal.  Eyes: Pupils are equal, round, and reactive to light. EOM are normal. Right eye exhibits no discharge. Left eye exhibits no discharge.  Neck: Normal range of motion. Neck supple.  Cardiovascular: Normal rate, regular rhythm and normal heart sounds.  Pulmonary/Chest: Effort normal and breath sounds normal.  Abdominal: Soft. There is no tenderness.  Neurological: She is alert.  CN 3-12 grossly intact. 5/5 strength in all 4 extremities. Grossly normal sensation. Normal finger to nose.   Skin: Skin is warm and dry.  Psychiatric: Her mood appears not anxious.  Nursing note and vitals reviewed.  ED Treatments / Results  Labs (all labs ordered are listed, but only abnormal results are displayed) Labs Reviewed  BASIC METABOLIC PANEL - Abnormal; Notable for the following components:      Result Value   Potassium 3.4 (*)    BUN 36 (*)    Creatinine, Ser 1.87 (*)    GFR calc non Af Amer 31 (*)    GFR calc Af Amer 36 (*)    All other components within normal limits  CBC WITH DIFFERENTIAL/PLATELET  CBG MONITORING, ED    EKG EKG Interpretation  Date/Time:  Wednesday June 02 2018 15:44:34 EDT Ventricular Rate:  55 PR Interval:    QRS Duration: 91 QT Interval:  474 QTC Calculation: 454 R Axis:   49 Text Interpretation:  Sinus rhythm no acute ST/T changes no significant change since June 2019 Confirmed by Sherwood Gambler [phone]) on 06/02/2018 3:59:05 PM   Radiology No results found.  Procedures Procedures (including critical care time)  Medications Ordered in ED Medications  potassium chloride SA (K-DUR,KLOR-CON) CR tablet 20 mEq (has no administration in time range)    sodium chloride 0.9 % bolus 1,000 mL (0 mLs Intravenous Stopped 06/02/18 1820)  metoCLOPramide (REGLAN) injection 10 mg (10 mg Intravenous Given 06/02/18 1640)  diphenhydrAMINE (BENADRYL) injection 25 mg (25 mg Intravenous Given 06/02/18 1641)  hydrALAZINE (APRESOLINE) tablet 10 mg (10 mg Oral Given 06/02/18 1619)  amLODipine (NORVASC) tablet 10 mg (10 mg Oral Given 06/02/18 1619)     Initial Impression / Assessment and Plan / ED Course  I have reviewed the triage vital signs and the nursing notes.  Pertinent labs & imaging results that were available during my care of the patient were reviewed by me and considered in my medical decision making (see chart for details).     Patient's headache is resolved.  The patient is overall feeling better and has 0 out of 10 pain in the blurry vision and dizziness have gone.  Likely hypertensive urgency.  Blood pressure has come down some with her oral meds and I think it is reasonable to continue oral medications as an outpatient.  Labs are reassuring besides minimal hypokalemia.  This is been repleted.  Creatinine is near baseline.  I doubt subarachnoid hemorrhage or other acute CNS emergency and do not think emergent CT is needed.  No strokelike symptoms.  Discussed importance of keeping up with the blood pressure medicine and following up with PCP.  Return precautions.  Final Clinical Impressions(s) / ED Diagnoses   Final diagnoses:  Occipital headache  Hypertensive urgency    ED Discharge Orders    None       Sherwood Gambler, MD 06/02/18 1902

## 2018-06-02 NOTE — ED Triage Notes (Signed)
Pt daughter states that patient ran out of HTN medications yesterday and is supposed to pick up from pharmacy today. Pt BP at home was 203/115. Also c/o headache and dizziness.

## 2018-06-03 ENCOUNTER — Telehealth: Payer: Self-pay | Admitting: Medical

## 2018-06-03 ENCOUNTER — Ambulatory Visit: Payer: Medicaid Other | Admitting: Medical

## 2018-06-03 VITALS — BP 140/106 | HR 70 | Temp 98.0°F | Resp 16 | Ht 61.0 in | Wt 175.2 lb

## 2018-06-03 DIAGNOSIS — K219 Gastro-esophageal reflux disease without esophagitis: Secondary | ICD-10-CM

## 2018-06-03 DIAGNOSIS — E039 Hypothyroidism, unspecified: Secondary | ICD-10-CM | POA: Diagnosis not present

## 2018-06-03 DIAGNOSIS — R609 Edema, unspecified: Secondary | ICD-10-CM

## 2018-06-03 DIAGNOSIS — I10 Essential (primary) hypertension: Secondary | ICD-10-CM

## 2018-06-03 DIAGNOSIS — E559 Vitamin D deficiency, unspecified: Secondary | ICD-10-CM

## 2018-06-03 DIAGNOSIS — E1149 Type 2 diabetes mellitus with other diabetic neurological complication: Secondary | ICD-10-CM

## 2018-06-03 DIAGNOSIS — E785 Hyperlipidemia, unspecified: Secondary | ICD-10-CM

## 2018-06-03 DIAGNOSIS — N028 Recurrent and persistent hematuria with other morphologic changes: Secondary | ICD-10-CM

## 2018-06-03 MED ORDER — POTASSIUM CHLORIDE ER 10 MEQ PO TBCR: 10.0000 meq | EXTENDED_RELEASE_TABLET | Freq: Two times a day (BID) | ORAL | 3 refills | Status: DC

## 2018-06-03 MED ORDER — ONDANSETRON HCL 4 MG PO TABS: 4.0000 mg | ORAL_TABLET | Freq: Three times a day (TID) | ORAL | 0 refills | Status: DC | PRN

## 2018-06-03 MED ORDER — FUROSEMIDE 40 MG PO TABS: 40.0000 mg | ORAL_TABLET | Freq: Two times a day (BID) | ORAL | 0 refills | Status: DC

## 2018-06-03 MED ORDER — AMLODIPINE BESYLATE 10 MG PO TABS: 10.0000 mg | ORAL_TABLET | Freq: Every day | ORAL | 3 refills | Status: DC

## 2018-06-03 NOTE — Telephone Encounter (Signed)
done

## 2018-06-03 NOTE — Progress Notes (Signed)
Subjective: Chief Complaint  Patient presents with  . follow up    BP hospital folllow up   Here for BP f/u.   Accompanied by her daughter who normally comes in with her who interprets.    She went to the emergency dept yesterday for elevated BPs and headache.   She apparently had run out of her BP medication for 30 4 days.  Per my chart record she has refills on everything except for hydralazine which was initially prescribed by the emergency department.  So she should not have been out of Lasix or amlodipine.  Part of the confusion it seems is that her other siblings sometimes go to the pharmacy and pick up medications and this led to some medicines not being picked up recently.  Her daughter today says that she should be the only one helping her with her medicines but unfortunately her other siblings sometimes fail to tell her when they have picked up the medication from the pharmacy.  So in the process Linda Black had not been taking some of her medicines.  She developed a headache the last few days and then that was emergency department with extremely high blood pressure readings.  Today she feels better but still has a low-grade headache.  She also saw [facility] back in September although I do not have those records to review.  Her daughter does not think any medications were changed at the September visit.  However when I saw her here at her last visit it was not clear whether she was taking everything that her kidney doctor wanted her to be taking.   It does not seem that she is taking hydralazine and she apparently is out of her potassium  She denies chest pain, shortness of breath, edema, palpitations.  Past Medical History:  Diagnosis Date  . Diabetes mellitus without complication ([place])   . Fatty liver 2019   elevated LFTs, negative for Hep B and Hep C 04/2018  . Hypertension   . Hypothyroidism 2019  . IgA nephropathy 2018   Dr. Corliss Parish  . Language barrier   . Obesity    . Proteinuria    Current Outpatient Medications on File Prior to Visit  Medication Sig Dispense Refill  . cholecalciferol (VITAMIN D) 1000 units tablet Take 2 tablets (2,000 Units total) by mouth daily. 180 tablet 3  . EPINEPHrine 0.3 mg/0.3 mL IJ SOAJ injection Only use during anaphylaxis 1 Device 0  . gabapentin (NEURONTIN) 100 MG capsule Take 1 capsule (100 mg total) by mouth at bedtime. 30 capsule 11  . Insulin Glargine (BASAGLAR KWIKPEN) 100 UNIT/ML SOPN Inject 0.05 mLs (5 Units total) into the skin at bedtime. 5 mL 11  . Insulin Pen Needle 32G X 4 MM MISC 1 each by Does not apply route at bedtime. 100 each 11  . levothyroxine (SYNTHROID, LEVOTHROID) 25 MCG tablet Take 1 tablet (25 mcg total) by mouth daily before breakfast. 30 tablet 11  . omeprazole (PRILOSEC) 40 MG capsule Take 1 capsule (40 mg total) by mouth daily. 90 capsule 3  . rosuvastatin (CRESTOR) 40 MG tablet Take 1 tablet (40 mg total) by mouth daily. 90 tablet 3  . diphenhydrAMINE (BENADRYL) 25 mg capsule Take 1 capsule (25 mg total) by mouth every 12 (twelve) hours for 5 days. 10 capsule 0  . hydrocortisone (ANUSOL-HC) 2.5 % rectal cream Place 1 application rectally 2 (two) times daily. (Patient not taking: Reported on 04/13/2018) 30 g 0   No current  facility-administered medications on file prior to visit.    ROS as in subjective    Objective: BP (!) 140/106   Pulse 70   Temp 98 F (36.7 C) (Oral)   Resp 16   Ht 5\' 1"  (1.549 m)   Wt 175 lb 3.2 oz (79.5 kg)   LMP 03/23/2017   SpO2 98%   BMI 33.10 kg/m   BP Readings from Last 3 Encounters:  06/03/18 (!) 140/106  06/02/18 (!) 200/98  05/17/18 (!) 140/94   Gen: wd, wn, nad Heart rrr, normal s1, s2, no murmrus Lungs clear Neck without JVD Ext: 1+ nonpitting edema Pulses wnl Neuro: nonfocal exam     Assessment: Encounter Diagnoses  Name Primary?  . Essential hypertension Yes  . Gastroesophageal reflux disease, esophagitis presence not specified    . Hypothyroidism, unspecified type   . Type 2 diabetes mellitus with neurological complications ([place])   . IgA nephropathy   . Edema, unspecified type   . Hyperlipidemia, unspecified hyperlipidemia type   . Vitamin D deficiency      Plan: I reviewed her emergency department visit from yesterday, labs, no CT performed.  I called over to [facility] to get a copy of her most recent visit notes from September 2019.  I reviewed these once they came in a few minutes later  We reviewed the following recommendations today:   Here is your current medication log  High blood pressure  Amlodipine/Norvasc 10 mg 1 tablet daily in the morning  Furosemide/Lasix 40 mg, 2 tablets daily in the morning, 1 tablet every evening (3 tablets daily in total)  Potassium chloride/Klor-Con 10 units, 1 tablet twice daily (always take this along with Lasix/furosemide)  Monitor your blood pressures and call us or [facility] within 2 weeks to give [place] an update on your blood pressure readings.  If your readings are not at goal within 2 weeks we may have to change something  (You were formerly prescribed hydralazine 10 mg 3 times a day, but per your recent kidney doctor visit, this is no longer active in the med list)  High cholesterol  Rosuvastatin/Crestor 40 mg, 1 tablet daily at bedtime  Vitamin D deficiency  Vitamin D 1000 units, 2 tablets daily in the morning  For allergic reaction or rash as needed  Benadryl/diphenhydramine 25 mg 1 tablet twice daily for allergic reaction  EpiPen as needed for emergency response to allergic reaction  Diabetic neuropathy/pain in the legs  Gabapentin/Neurontin 100 mg daily at bedtime  Hypothyroidism  Levothyroxine/Synthroid 25 mcg daily in the morning 1 hour before breakfast  Nausea  Ondansetron/Zofran 4 mg every 8 hours as needed  Acid reflux/heartburn  Omeprazole/Prilosec 40 mg daily in the morning on empty stomach, but wait 30 to 40 minutes  after taking the thyroid medicine  Diabetes  Basaglar /insulin glargine pen, 5 unites daily at bedtime   If sugar readings in the morning are running on average > 130, then go up 2 units per week    Try to keep your water intake consistent  Avoid adding salt to your diet  Exercise regularly such as walking daily for 30 minutes or more   Please use the phone numbers below to contact those pharmacies for possible home delivery or mail order.  Your insurance may dictate which mail order pharmacy you can use  [facility] [address] [place], [place] [address] [phone]    Linda Black was seen today  for follow up.  Diagnoses and all orders for this visit:  Essential hypertension  Gastroesophageal reflux disease, esophagitis presence not specified  Hypothyroidism, unspecified type  Type 2 diabetes mellitus with neurological complications ([facility])  IgA nephropathy  Edema, unspecified type  Hyperlipidemia, unspecified hyperlipidemia type  Vitamin D deficiency  Other orders -     amLODipine (NORVASC) 10 MG tablet; Take 1 tablet (10 mg total) by mouth daily. -     furosemide (LASIX) 40 MG tablet; Take 1 tablet (40 mg total) by mouth 2 (two) times daily. 2 am, 1 pm (3 total daily PER DR. GOLDSBOROUGH!!! -     ondansetron (ZOFRAN) 4 MG tablet; Take 1 tablet (4 mg total) by mouth every 8 (eight) hours as needed for nausea or vomiting. -     potassium chloride (KLOR-CON 10) 10 MEQ tablet; Take 1 tablet (10 mEq total) by mouth 2 (two) times daily.

## 2018-06-03 NOTE — Patient Instructions (Signed)
  Here is your current medication log  High blood pressure  Amlodipine/Norvasc 10 mg 1 tablet daily in the morning  Furosemide/Lasix 40 mg, 2 tablets daily in the morning, 1 tablet every evening (3 tablets daily in total)  Potassium chloride/Klor-Con 10 units, 1 tablet twice daily (always take this along with Lasix/furosemide)  Monitor your blood pressures and call us or [facility] within 2 weeks to give [place] an update on your blood pressure readings.  If your readings are not at goal within 2 weeks we may have to change something  (You were formerly prescribed hydralazine 10 mg 3 times a day, but per your recent kidney doctor visit, this is no longer active in the med list)  High cholesterol  Rosuvastatin/Crestor 40 mg, 1 tablet daily at bedtime  Vitamin D deficiency  Vitamin D 1000 units, 2 tablets daily in the morning  For allergic reaction or rash as needed  Benadryl/diphenhydramine 25 mg 1 tablet twice daily for allergic reaction  EpiPen as needed for emergency response to allergic reaction  Diabetic neuropathy/pain in the legs  Gabapentin/Neurontin 100 mg daily at bedtime  Hypothyroidism  Levothyroxine/Synthroid 25 mcg daily in the morning 1 hour before breakfast  Nausea  Ondansetron/Zofran 4 mg every 8 hours as needed  Acid reflux/heartburn  Omeprazole/Prilosec 40 mg daily in the morning on empty stomach, but wait 30 to 40 minutes after taking the thyroid medicine  Diabetes  Basilar /insulin glargine pen, 5 units daily at bedtime   If sugar readings in the morning are running on average > 130, then go up 2 units per week    Try to keep your water intake consistent  Avoid adding salt to your diet  Exercise regularly such as walking daily for 30 minutes or more   Please use the phone numbers below to contact those pharmacies for possible home delivery or mail order.  Your insurance may dictate which mail order pharmacy you can use  [facility] [address] [place], [place] [address] [phone]

## 2018-06-03 NOTE — Telephone Encounter (Signed)
Please send a copy of my office note from today to Dr. Moshe Cipro at [facility].

## 2018-06-06 NOTE — Progress Notes (Signed)
Name: Linda Black  MRN/ DOB: [id number], February 28, 1971   Age/ Sex: 47 y.o., female    PCP: Carlena Hurl, PA-C   Reason for Endocrinology Evaluation: Type 2 Diabetes Mellitus     Date of Initial Endocrinology Visit: 06/07/2018     PATIENT IDENTIFIER: Linda Black is a 47 y.o. female with a past medical history of, HTN, Dyslipidemia, CKD and hypothyroidism as well as hyperglycemia . The patient presented for initial ndocrinology clinic visit on 06/07/2018 for consultative assistance with her diabetes management.   Interpreter present today    HPI: Ms. Obi was told she has diabetes ~ 1 year ago. She was started on Basal insulin with instructions to titrate the dose up based on glucose readings. Patient has been checking glucose once a day, during the evening hours, her checks are variable as sometimes before or after a meal. Her readings average 90-100 mg/dL , the highest she has seen was 180 mg/dL . She has not been on oral glycemic agents in the past. She denies hypoglycemia with glucose of < 70 mg/dL or symptoms of hypoglycemia.    Hemoglobin A1c has ranged from 5.2% in 2017, peaking at 6.4% in 2019 Patient has not required hospitalization within the last 1 year from hyper or hypoglycemia.   In terms of diet, the patient avoids sugar-sweetened beverages. Eats 2 meals a day (lunch and supper). She tends to snack on fruits.  In review of her chart, it seems that DM diagnosis appeared in her chart sometime between 2016 and 2017. The highest A1c has been 6.4 % which is in the pre-diabetes category. Ironically hyperglycemia did not appear on her plasma until early this year, which it seems she was on cyclophosphamide and prednisone for IgA Nephropathy the end of 2018.   Hypertension :  In review of her records, she has been diagnosed with HTN since 2015. She has been on multiple anti-hypertensive medications over the years, she was also noted to have hypokalemia at some point. She was  tried on Lisinopril, B-blockers, spironolactone and hydralazine. She follows with nephrology. Has been off Prednisone and cyclophosphamide for sometime.  Patient does have episodic spells of hypertension associated with headaches and palpitations. No triggering factors. She has LE edema . She is compliant with her meds.  She has gained ~ 30 lbs in the past year. No menses for the past 3 years.       HOME DIABETES REGIMEN: Basaglar 5-6 units daily    Statin: Yes ACE-I/ARB: No  Prior Diabetic Education: No   METER DOWNLOAD SUMMARY: Did not bring    Evening BG range 90-100 - highest 180 mg/dL       DIABETIC COMPLICATIONS: Microvascular complications:   Neuropathy  Denies: retinopathy   Last eye exam: Never   Macrovascular complications:    Denies: CAD, PVD, CVA   PAST HISTORY: Past Medical History:  Past Medical History:  Diagnosis Date  . Diabetes mellitus without complication ([place])   . Fatty liver 2019   elevated LFTs, negative for Hep B and Hep C 04/2018  . Hypertension   . Hypothyroidism 2019  . IgA nephropathy 2018   Dr. Corliss Parish  . Language barrier   . Obesity   . Proteinuria    Past Surgical History:  Past Surgical History:  Procedure Laterality Date  . RENAL BIOPSY  2018   IgA nephropathy, Dr. Corliss Parish      Social History:  reports that she has never smoked.  She has never used smokeless tobacco. She reports that she does not drink alcohol or use drugs. Family History:  Family History  Problem Relation Age of Onset  . Diabetes Mother   . Hypertension Father      HOME MEDICATIONS: Allergies as of 06/07/2018      Reactions   Lisinopril Swelling   Angioedema       Medication List        Accurate as of 06/07/18 12:57 PM. Always use your most recent med list.          amLODipine 10 MG tablet Commonly known as:  NORVASC Take 1 tablet (10 mg total) by mouth daily.   cholecalciferol 1000 units tablet Commonly  known as:  VITAMIN D Take 2 tablets (2,000 Units total) by mouth daily.   diphenhydrAMINE 25 mg capsule Commonly known as:  BENADRYL Take 1 capsule (25 mg total) by mouth every 12 (twelve) hours for 5 days.   EPINEPHrine 0.3 mg/0.3 mL Soaj injection Commonly known as:  EPI-PEN Only use during anaphylaxis   furosemide 40 MG tablet Commonly known as:  LASIX Take 1 tablet (40 mg total) by mouth 2 (two) times daily. 2 am, 1 pm (3 total daily PER DR. GOLDSBOROUGH!!!   gabapentin 100 MG capsule Commonly known as:  NEURONTIN Take 1 capsule (100 mg total) by mouth at bedtime.   hydrocortisone 2.5 % rectal cream Commonly known as:  ANUSOL-HC Place 1 application rectally 2 (two) times daily.   Insulin Pen Needle 32G X 4 MM Misc 1 each by Does not apply route at bedtime.   levothyroxine 25 MCG tablet Commonly known as:  SYNTHROID, LEVOTHROID Take 1 tablet (25 mcg total) by mouth daily before breakfast.   metoprolol succinate 25 MG 24 hr tablet Commonly known as:  TOPROL-XL Take 1 tablet (25 mg total) by mouth daily. PLEASE  DISCARD VALSARTAN ORDER   omeprazole 40 MG capsule Commonly known as:  PRILOSEC Take 1 capsule (40 mg total) by mouth daily.   ondansetron 4 MG tablet Commonly known as:  ZOFRAN Take 1 tablet (4 mg total) by mouth every 8 (eight) hours as needed for nausea or vomiting.   potassium chloride 10 MEQ tablet Commonly known as:  K-DUR Take 1 tablet (10 mEq total) by mouth 2 (two) times daily.   rosuvastatin 40 MG tablet Commonly known as:  CRESTOR Take 1 tablet (40 mg total) by mouth daily.        ALLERGIES: Allergies  Allergen Reactions  . Lisinopril Swelling    Angioedema      REVIEW OF SYSTEMS: A comprehensive ROS was conducted with the patient and is negative except as per HPI and below:  Review of Systems  Constitutional: Positive for malaise/fatigue. Negative for weight loss.  HENT: Negative for congestion and sore throat.   Eyes: Positive  for blurred vision. Negative for pain.  Respiratory: Positive for shortness of breath. Negative for cough.        When walking   Cardiovascular: Positive for chest pain. Negative for palpitations.       This seems to be chronic , evaluated by PCP.   Gastrointestinal: Negative for constipation and nausea.  Skin: Negative for itching and rash.  Neurological: Positive for dizziness, tingling and headaches.  Psychiatric/Behavioral: Positive for depression. The patient is nervous/anxious.       OBJECTIVE:   VITAL SIGNS: BP (!) 152/98 (BP Location: Right Arm, Patient Position: Sitting, Cuff Size: Normal)   Pulse 81  Ht 5\' 1"  (1.549 m)   Wt 175 lb 6.4 oz (79.6 kg)   LMP 03/23/2017   SpO2 99%   BMI 33.14 kg/m    PHYSICAL EXAM:  General: Pt appears well and is in NAD  Hydration: Well-hydrated with moist mucous membranes and good skin turgor  HEENT: Head: Unremarkable with good dentition. Oropharynx clear without exudate.  Eyes: External eye exam normal without stare, lid lag or exophthalmos.  EOM intact.  PERRL.  Neck: General: Supple without adenopathy or carotid bruits. Thyroid: Thyroid size normal.  No goiter or nodules appreciated. No thyroid bruit.  Lungs: Clear with good BS bilat with no rales, rhonchi, or wheezes  Heart: RRR with normal S1 and S2 and no gallops; no murmurs; no rub  Abdomen: Normoactive bowel sounds, soft, nontender, without masses or organomegaly palpable  Extremities:  Lower extremities - Compression stocking in place. No lesions.  Skin: Normal texture and temperature to palpation. No rash noted. No Acanthosis nigricans/skin tags. No purple striae  Neuro: MS is good with appropriate affect, pt is alert and Ox3     DATA REVIEWED:  Lab Results  Component Value Date   HGBA1C 5.6 05/17/2018   HGBA1C 6.4 (A) 02/08/2018   HGBA1C 6.2 (H) 09/02/2017   Lab Results  Component Value Date   MICROALBUR 62.7 (H) 01/29/2016   LDLCALC 73 01/20/2018   CREATININE  1.87 (H) 06/02/2018          Results for BRAILEIGH, LANDENBERGER (MRN [id number]) as of 06/07/2018 10:25  Ref. Range 05/17/2018 09:26  Hemoglobin A1C Latest Ref Range: 4.8 - 5.6 % 5.6    Old records , labs and images have been reviewed.   ASSESSMENT / PLAN / RECOMMENDATIONS:   1) Pre-Diabetes :  - Most recent A1c of 5.6 %.   Plan: GENERAL:  Her A1c today is 5.6% while on basal insulin, which is very concerning for hypoglycemia with such a low A1c on insulin.   In 2016-2017 there's no evidence of diabetes - she did have prediabetes . She did have a fasting finger stick of 191 mg/dL  in the office but per the ADA criteria, patient should have a fasting plasma glucose of  Equal or > 126 mg/dL to be diagnosed with diabetes.  American Diabetes Association Criteria for the diagnosis of diabetes:  A1c   equal or > 6.5% .        OR   2. Fasting Plasma Glucose  equal or > 126 mg/dL . Fasting is defined as no caloric intake for at least 8 hrs.         OR   3. 2-hour plasma glucose of equal or > 200 mg/dL during an oral glucose tolerance test .        OR    4. In a patient with classic symptoms of hyperglycemia or hyperglycemic crises , a random plasma glucose of equal or > 200 mg/dL .    I believe her hyperglycemia in 2019 is due to immunotherapy for her IgA nephropathy.     RECOMMENDATIONS:  Stop insulin  Check FSBS twice a day (fasting and bedtime)  Call office if BG > 250 mg/dL   Recheck in 1 month to determine status        2) Hypertension: This is out of control and continues to be, despite multiple regimen. Her IgA nephropathy is a major contributor, in the meantime, I will start her on Metoprolol XL 25 mg daily  - I will  check plasma metanephrines to rule out pheochromocytoma - I will check aldo: renin ratio , despite difficult interpretation in the presents of decreased GFR.     F/U in 1 months    Time spent with patient was over 45 minutes. 50% of the time was  spent counseling and answering questions.   Signed electronically by: Mack Guise, MD  [facility] Endocrinology  [facility] [address] [place], [place] [address] Phone: [phone] FAX: [phone]   CC: Carlena Hurl, PA-C [place] [address] Phone: [phone]  Fax: [phone]    Return to Endocrinology clinic as below: Future Appointments  Date Time Provider [place]  07/05/2018  8:50 AM Shamleffer, Melanie Crazier, MD [id number] None

## 2018-06-07 ENCOUNTER — Telehealth: Payer: Self-pay | Admitting: Medical

## 2018-06-07 ENCOUNTER — Ambulatory Visit: Payer: Medicaid Other | Admitting: Internal Medicine

## 2018-06-07 ENCOUNTER — Encounter: Payer: Self-pay | Admitting: Internal Medicine

## 2018-06-07 VITALS — BP 152/98 | HR 81 | Ht 61.0 in | Wt 175.4 lb

## 2018-06-07 DIAGNOSIS — I10 Essential (primary) hypertension: Secondary | ICD-10-CM

## 2018-06-07 DIAGNOSIS — R739 Hyperglycemia, unspecified: Secondary | ICD-10-CM | POA: Diagnosis not present

## 2018-06-07 LAB — POTASSIUM: Potassium: 3.4 mEq/L — ABNORMAL LOW (ref 3.5–5.1)

## 2018-06-07 MED ORDER — VALSARTAN 40 MG PO TABS: 40.0000 mg | ORAL_TABLET | Freq: Every day | ORAL | 3 refills | Status: DC

## 2018-06-07 MED ORDER — METOPROLOL SUCCINATE ER 25 MG PO TB24: 25.0000 mg | ORAL_TABLET | Freq: Every day | ORAL | 3 refills | Status: DC

## 2018-06-07 NOTE — Telephone Encounter (Signed)
I reviewed her endocrinology notes today, and I sent message to endocrinology clarifying some of my medical decisions from this past year regarding insulin in light of chronic prednisone therapy and diabetic symptoms she was having in early 2019.   FYI: The reasoning behind referral to endocrinology today was not only for hyperglycemia but also consult regarding thyroid disease, obesity, nutrition counseling with your in house dietician, and overall advice on therapy options for her care in light of IgA nephropathy.    When I read through today's endocrinology note, I realized that I never received her prior records regarding initial diagnosis of diabetes before I met her.  When I started seeing Linda Black back in late 2017, she came to me with a reported diagnosis of diabetes, CKD, and questionable thyroid disease as well.   Her glucose and HgbA1C had been in the prediabetic range until just before 08/2017. At her 08/2017 visit I had started insulin low dose for a few reasons. Prior to 08/2017 she had been reportedly diabetic but diet controlled, although I never was able to get prior records proving diabetic criteria. However, by 08/2017, she was having neuropathy symptoms in her legs, she already had known kidney disease, and she was at that time on prednisone 36m DAILY for the foreseeable next few months givne the IgA nephropathy. When we got a fasting 191 at that visit, I decided to try and combat the effect of prednisone and her hyperglycinemia with lose dose insulin instead of Metformin and other options that could potentially harm the kidneys. She was going to be on prednisone for months, but since we have a hard time getting records from nephrology, I wasn't exactly sure when nephrology was going to stop her prednisone.  I had follow up planned with [name] that time.      Sometimes its good to get a new perspective, so I am glad she had the consult with endocrinology today, and happy they  discontinued the insulin.

## 2018-06-07 NOTE — Patient Instructions (Addendum)
-   Stop Insulin (Basaglar) - Check sugar twice  a day (fasting and bedtime) - Bring meter on next visit  - Call us with sugars > 250 mg/dL

## 2018-06-08 ENCOUNTER — Other Ambulatory Visit: Payer: Self-pay | Admitting: Medical

## 2018-06-08 NOTE — Telephone Encounter (Signed)
Is this ok to refill?  

## 2018-06-09 LAB — ALDOSTERONE + RENIN ACTIVITY W/O RATIO
ALDOSTERONE: 27 ng/dL (ref 0.0–30.0)
RENIN: 1.674 ng/mL/h (ref 0.167–5.380)

## 2018-06-11 ENCOUNTER — Telehealth: Payer: Self-pay | Admitting: Internal Medicine

## 2018-06-11 LAB — METANEPHRINES, PLASMA
Metanephrine, Free: 30 pg/mL (ref <=57)
NORMETANEPHRINE FREE: 231 pg/mL — AB (ref <=148)
TOTAL METANEPHRINES-PLASMA: 261 pg/mL — AB (ref <=205)

## 2018-06-11 MED ORDER — POTASSIUM CHLORIDE CRYS ER 20 MEQ PO TBCR: 20.0000 meq | EXTENDED_RELEASE_TABLET | Freq: Every day | ORAL | 3 refills | Status: DC

## 2018-06-11 NOTE — Telephone Encounter (Signed)
  Attempted to call pt through interpretor Mr. Linda Black  Unable to reach patient on home #. We called the other contact phone # on her list, her son Linda Black answered.  We discussed low potassium, prescription sent to the pharmacy to be taken daily until next visit , will repeat aldo:renin ratio should potassium     Son expressed understanding.    Linda Black Linda Black

## 2018-06-29 NOTE — Progress Notes (Signed)
Name: Linda Black  Age/ Sex: 47 y.o., female   MRN/ DOB: [id number], Mar 04, 1971     PCP: Caryl Ada   Reason for Endocrinology Evaluation: Hyperglycemia  Initial Endocrine Consultative Visit: 06/07/18    PATIENT IDENTIFIER: [name] is a 47 y.o. female with a past medical history of HTN, CKD and hypothyroidism. The patient has followed with Endocrinology clinic since 06/07/2018 for consultative assistance with management of her hyperglcyemia.  DIABETIC HISTORY:  Linda Black was diagnosed with pre-diabetes years ago, she was on a diet plan. She was diagnosed with IgA nephropathy in 2018 and while on cyclophosphamide and prednisone she was noted to have hyperglycemia and basal insulin was started by PCP. Her hemoglobin A1c has ranged from 5.2% in 2017, peaking at 6.4% in 2019.  Thyroid : She was diagnosed with hypothyroidism during an ED visit on 01/16/2018 for an allergic reaction. She was initially started on Levothyroxine 50 mcg daily but this has been reduced to 25 mcg daily due to low TSH.  She has been taking it after a meal. She is compliant with taking it daily. Denies any intolerance.   SUBJECTIVE:   During the last visit (06/07/2018): Due to questionable diagnosis of DM and an A1c of  5.6% while on basal insulin. We stopped basal insulin and advised the patient to start checking glucose to determine BG's while off prednisone.   Today (07/05/2018): Linda Black is here for a 4 week DM follow up. She is here with a [place] interpreter "Lek".   She checks her blood sugars 2 imes daily, preprandial to breakfast and supper. The patient has not had hypoglycemic episodes since the last clinic visit. Otherwise, the patient has not required any recent emergency interventions for hypoglycemia and has not had recent hospitalizations secondary to hyper or hypoglycemic episodes.    Pt did notice weight  gain with decreased exercise. She has cut out snacking, she has been eating 2 meals a day (brunch and supper) She is c/o lower extremity pain and back pain  Somehow there was a miscommunication with potassium intake and has been taking a total of 40 mEq/daily   ROS: As per HPI and as detailed below: Review of Systems  Constitutional: Positive for malaise/fatigue. Negative for weight loss.  Eyes: Positive for blurred vision. Negative for pain.  Respiratory: Negative for cough and shortness of breath.   Skin: Negative for rash.       + hair loss.   Neurological: Positive for tingling and headaches.      HOME DIABETES REGIMEN:  N/A    GLUCOSE LOG:   Date Before Breakfast Before Supper   06/09/18 197   11/7 117   11/8 116 137  11/9 188 151  11/10 116 148  11/13 177   11/14  112  11/16 170   12/1 148      HISTORY:  Past Medical History:  Past Medical History:  Diagnosis Date  . Diabetes mellitus without complication ([place])   . Fatty liver 2019   elevated LFTs, negative for Hep B and Hep C 04/2018  . Hypertension   . Hypothyroidism 2019  . IgA nephropathy 2018   Dr. Corliss Parish  . Language barrier   . Obesity   . Proteinuria    Past Surgical History:  Past Surgical History:  Procedure Laterality Date  . RENAL BIOPSY  2018   IgA nephropathy, Dr. Corliss Parish    Social History:  reports that she  has never smoked. She has never used smokeless tobacco. She reports that she does not drink alcohol or use drugs. Family History:  Family History  Problem Relation Age of Onset  . Diabetes Mother   . Hypertension Father      HOME MEDICATIONS: Allergies as of 07/05/2018      Reactions   Lisinopril Swelling   Angioedema       Medication List        Accurate as of 07/05/18  8:55 AM. Always use your most recent med list.          amLODipine 10 MG tablet Commonly known as:  NORVASC Take 1 tablet (10 mg total) by mouth daily.   cholecalciferol  1000 units tablet Commonly known as:  VITAMIN D Take 2 tablets (2,000 Units total) by mouth daily.   diphenhydrAMINE 25 mg capsule Commonly known as:  BENADRYL Take 1 capsule (25 mg total) by mouth every 12 (twelve) hours for 5 days.   EPINEPHrine 0.3 mg/0.3 mL Soaj injection Commonly known as:  EPI-PEN Only use during anaphylaxis   furosemide 40 MG tablet Commonly known as:  LASIX Take 1 tablet (40 mg total) by mouth 2 (two) times daily. 2 am, 1 pm (3 total daily PER DR. GOLDSBOROUGH!!!   gabapentin 100 MG capsule Commonly known as:  NEURONTIN Take 1 capsule (100 mg total) by mouth at bedtime.   hydrocortisone 2.5 % rectal cream Commonly known as:  ANUSOL-HC Place 1 application rectally 2 (two) times daily.   Insulin Pen Needle 32G X 4 MM Misc 1 each by Does not apply route at bedtime.   levothyroxine 25 MCG tablet Commonly known as:  SYNTHROID, LEVOTHROID Take 1 tablet (25 mcg total) by mouth daily before breakfast.   metoprolol succinate 25 MG 24 hr tablet Commonly known as:  TOPROL-XL Take 1 tablet (25 mg total) by mouth daily. PLEASE  DISCARD VALSARTAN ORDER   omeprazole 40 MG capsule Commonly known as:  PRILOSEC Take 1 capsule (40 mg total) by mouth daily.   ondansetron 4 MG tablet Commonly known as:  ZOFRAN Take 1 tablet (4 mg total) by mouth every 8 (eight) hours as needed for nausea or vomiting.   potassium chloride 10 MEQ tablet Commonly known as:  K-DUR Take 1 tablet (10 mEq total) by mouth 2 (two) times daily.   potassium chloride SA 20 MEQ tablet Commonly known as:  K-DUR,KLOR-CON Take 1 tablet (20 mEq total) by mouth daily.   promethazine 25 MG tablet Commonly known as:  PHENERGAN TAKE 1/2 TO 1 TABLET EVERY 6 HOURS AS NEEDED FOR NAUSEA   rosuvastatin 40 MG tablet Commonly known as:  CRESTOR Take 1 tablet (40 mg total) by mouth daily.        OBJECTIVE:   Vital Signs: BP (!) 142/84 (BP Location: Left Arm, Patient Position: Sitting, Cuff  Size: Normal)   Pulse 74   Ht 5\' 1"  (1.549 m)   Wt 180 lb 9.6 oz (81.9 kg)   LMP 03/23/2017   SpO2 99%   BMI 34.12 kg/m   Wt Readings from Last 3 Encounters:  07/05/18 180 lb 9.6 oz (81.9 kg)  06/07/18 175 lb 6.4 oz (79.6 kg)  06/03/18 175 lb 3.2 oz (79.5 kg)     Exam: General: Pt appears well and is in NAD  Lungs: Clear with good BS bilat with no rales, rhonchi, or wheezes  Heart: RRR with normal S1 and S2 and no gallops; no murmurs; no rub  Abdomen: Normoactive bowel sounds, soft, nontender, without masses or organomegaly palpable  Extremities: No pretibial edema.  Skin: Normal texture and temperature to palpation. No rash noted.   Neuro: MS is good with appropriate affect, pt is alert and Ox3     DATA REVIEWED:  Lab Results  Component Value Date   HGBA1C 5.6 05/17/2018   HGBA1C 6.4 (A) 02/08/2018   HGBA1C 6.2 (H) 09/02/2017    Lab Results  Component Value Date   CHOL 145 01/20/2018   HDL 56 01/20/2018   LDLCALC 73 01/20/2018   TRIG 78 01/20/2018   CHOLHDL 2.6 01/20/2018       Results for Linda Black, Linda Black (MRN [id number]) as of 07/06/2018 11:22  Ref. Range 07/05/2018 09:26  TSH Latest Ref Range: 0.35 - 4.50 uIU/mL 3.48  T4,Free(Direct) Latest Ref Range: 0.60 - 1.60 ng/dL 0.63    ASSESSMENT / PLAN / RECOMMENDATIONS:   1) Impaired fasting Glucose:-   - Praised patient on glucose checks and lifestyle changes  - Her fasting glucose is variable and for the most part in the high range of > 130 mg/dlL, her pre-supper glucose is acceptable.  - We will not make any additional changes and continue to monitor glucose.  - She was advised to call us with BG's consistently > 200 mg/dL  - She was advised to continue avoiding snacks and sugar- sweetened beverages  MEDICATIONS:  N/A  EDUCATION / INSTRUCTIONS:  BG monitoring instructions: Patient is instructed to check her blood sugars 2 times a day, fasting and bedtime.  Call [facility] if: BG  persistently < 70 or > 300. . I reviewed the Rule of 15 for the treatment of hypoglycemia in detail with the patient. Literature supplied.  2) Lipids: Patient is on a statin and LDL at 72 mg/dL . No changes   3) Hypertension: She is above goal of < 140/90 mmHg. Will have to repeat renin/aldo ratio,knowing there will be some skewed numbers due to her renal insufficieny but I am concerned due to uncontrolled HTN and consistent hypokalemia.  We can only run renin/aldo ratio if the potassium is normal , preferably > 4.0 mEq/L. Repeat K+ today is lower then it has been at 3.1 mEq/L. Will increase potassium to 20 mEq/L BID  In the meantime, she was advised to start taking  more then 20 mEq of K+ a day , repeat labs in 2 weeks   4.) Hypothyroidism   - Pt is clinically and bio-chemically euthyroid - Pt educated extensively on the correct way to take levothyroxine (first thing in the morning with water, 30 minutes before eating or taking other medications). - Pt encouraged to double dose the following day if she were to miss a dose given long half-life of levothyroxine. - Continue 25 mcg of Levothyroxine    5.) Hypokalemia : - Increase potassium chloride to 20 meq bid  - recheck in 2 weeks   F/U on or after 08/18/2018  Addendum: Discussed potassium levels with daughter on 12/3 @ 11:30 am. She was advised to increase potassium to BID and repeat labs in 2 weeks . Daughter expressed understanding   Signed electronically by: Mack Guise, MD  [facility]  [facility] [address] [place],  [address] Phone: [phone] FAX: [phone]   CC: Carlena Hurl, PA-C [place] [address] Phone: [phone]  Fax: [phone]  Return to Endocrinology clinic as below: No future appointments.

## 2018-07-05 ENCOUNTER — Ambulatory Visit (INDEPENDENT_AMBULATORY_CARE_PROVIDER_SITE_OTHER): Payer: Medicaid Other | Admitting: Internal Medicine

## 2018-07-05 ENCOUNTER — Encounter: Payer: Self-pay | Admitting: Internal Medicine

## 2018-07-05 VITALS — BP 142/84 | HR 74 | Ht 61.0 in | Wt 180.6 lb

## 2018-07-05 DIAGNOSIS — E876 Hypokalemia: Secondary | ICD-10-CM | POA: Diagnosis not present

## 2018-07-05 DIAGNOSIS — I10 Essential (primary) hypertension: Secondary | ICD-10-CM | POA: Diagnosis not present

## 2018-07-05 DIAGNOSIS — R7301 Impaired fasting glucose: Secondary | ICD-10-CM | POA: Diagnosis not present

## 2018-07-05 DIAGNOSIS — R7989 Other specified abnormal findings of blood chemistry: Secondary | ICD-10-CM

## 2018-07-05 LAB — POTASSIUM: POTASSIUM: 3.1 meq/L — AB (ref 3.5–5.1)

## 2018-07-05 LAB — TSH: TSH: 3.48 u[IU]/mL (ref 0.35–4.50)

## 2018-07-05 LAB — T4, FREE: FREE T4: 0.63 ng/dL (ref 0.60–1.60)

## 2018-07-05 NOTE — Patient Instructions (Addendum)
-   Continue to check sugar twice a day (breakfast and Bedtime) - Take no more then 20 mEq of potassium a day  - If sugar is over 200 consistently, please call us to start you on some medications to lower your sugar -

## 2018-07-06 MED ORDER — POTASSIUM CHLORIDE CRYS ER 20 MEQ PO TBCR: 20.0000 meq | EXTENDED_RELEASE_TABLET | Freq: Two times a day (BID) | ORAL | 3 refills | Status: DC

## 2018-07-20 ENCOUNTER — Other Ambulatory Visit: Payer: Medicaid Other

## 2018-07-22 ENCOUNTER — Other Ambulatory Visit (INDEPENDENT_AMBULATORY_CARE_PROVIDER_SITE_OTHER): Payer: Medicaid Other

## 2018-07-22 DIAGNOSIS — I10 Essential (primary) hypertension: Secondary | ICD-10-CM

## 2018-07-22 LAB — POTASSIUM: Potassium: 3.4 mEq/L — ABNORMAL LOW (ref 3.5–5.1)

## 2018-07-26 ENCOUNTER — Telehealth: Payer: Self-pay | Admitting: Internal Medicine

## 2018-07-26 NOTE — Telephone Encounter (Signed)
Petrol returning call from our office. Please call patient at ph# [phone].

## 2018-07-26 NOTE — Telephone Encounter (Signed)
Called the patient through the interpreter line on 07/26/18 @ 9 am but was told by the interpreter patient hung up on him .Marland Kitchen  Attempted to call the son, but went straight to voice mail. Left him a message for a call back     Need to discuss low potassium and the need to increase Potassium chloride , after confirming how much she is taking at this time.    Abby Nena Jordan, MD  [facility]  [facility] [address] [place], [place] [address] Phone: [phone] FAX: [phone]

## 2018-07-26 NOTE — Telephone Encounter (Signed)
(   late Entry for Sec. Attempt). LVM thru interpreter regarding Dr. Quin Hoop with Pt. need to discuss low potassium and the need to increase Potassium chloride, after confirming how much she is taking at this time.

## 2018-07-26 NOTE — Telephone Encounter (Signed)
Left VM to return call second attempt.

## 2018-07-27 NOTE — Telephone Encounter (Signed)
Several attempts made for patient to give the office a call to discuss lab result as while as go over current dose of potassium chloride with out success. With each attempt and interpreter was present. A letter was mailed out with translation of the same .

## 2018-07-29 LAB — ALDOSTERONE + RENIN ACTIVITY W/O RATIO
ALDOSTERONE: 18.2 ng/dL (ref 0.0–30.0)
Renin: 0.838 ng/mL/hr (ref 0.167–5.380)

## 2018-08-23 ENCOUNTER — Ambulatory Visit: Payer: Medicaid Other | Admitting: Internal Medicine

## 2018-09-08 ENCOUNTER — Other Ambulatory Visit: Payer: Self-pay | Admitting: Medical

## 2018-09-08 ENCOUNTER — Other Ambulatory Visit: Payer: Self-pay

## 2018-09-08 MED ORDER — LOSARTAN POTASSIUM 50 MG PO TABS: 50.0000 mg | ORAL_TABLET | Freq: Every day | ORAL | 0 refills | Status: DC

## 2018-09-08 MED ORDER — METOPROLOL SUCCINATE ER 25 MG PO TB24: 25.0000 mg | ORAL_TABLET | Freq: Every day | ORAL | 3 refills | Status: DC

## 2018-09-08 NOTE — Telephone Encounter (Signed)
Is this ok to refill?  

## 2018-09-09 ENCOUNTER — Ambulatory Visit: Payer: Medicaid Other | Admitting: Internal Medicine

## 2018-09-09 NOTE — Progress Notes (Deleted)
Name: Linda Black  Age/ Sex: 48 y.o., female   MRN/ DOB: [id number], 09-Sep-1970     PCP: Caryl Ada   Reason for Endocrinology Evaluation: Hyperglycemia  Initial Endocrine Consultative Visit: 06/07/18    PATIENT IDENTIFIER: Linda Black is a 48 y.o. female with a past medical history of HTN, CKD and hypothyroidism. The patient has followed with Endocrinology clinic since 06/07/2018 for consultative assistance with management of her hyperglcyemia.  DIABETIC HISTORY:  Linda Black was diagnosed with pre-diabetes years ago, she was on a diet plan. She was diagnosed with IgA nephropathy in 2018 and while on cyclophosphamide and prednisone she was noted to have hyperglycemia and basal insulin was started by PCP. Her hemoglobin A1c has ranged from 5.2% in 2017, peaking at 6.4% in 2019.  Thyroid : She was diagnosed with hypothyroidism during an ED visit on 01/16/2018 for an allergic reaction. She was initially started on Levothyroxine 50 mcg daily but this has been reduced to 25 mcg daily due to low TSH.  She has been taking it after a meal. She is compliant with taking it daily. Denies any intolerance.   SUBJECTIVE:   During the last visit (06/07/2018): Due to questionable diagnosis of DM and an A1c of  5.6% while on basal insulin. We stopped basal insulin and advised the patient to start checking glucose to determine BG's while off prednisone.   Today (09/09/2018): Linda Black is here for a 4 week DM follow up. She is here with a [place] interpreter "Lek".   She checks her blood sugars 2 imes daily, preprandial to breakfast and supper. The patient has not had hypoglycemic episodes since the last clinic visit. Otherwise, the patient has not required any recent emergency interventions for hypoglycemia and has not had recent hospitalizations secondary to hyper or hypoglycemic episodes.    Pt did notice weight  gain with decreased exercise. She has cut out snacking, she has been eating 2 meals a day (brunch and supper) She is c/o lower extremity pain and back pain  Somehow there was a miscommunication with potassium intake and has been taking a total of 40 mEq/daily   ROS: As per HPI and as detailed below: Review of Systems  Constitutional: Positive for malaise/fatigue. Negative for weight loss.  Eyes: Positive for blurred vision. Negative for pain.  Respiratory: Negative for cough and shortness of breath.   Skin: Negative for rash.       + hair loss.   Neurological: Positive for tingling and headaches.      HOME DIABETES REGIMEN:  N/A    GLUCOSE LOG:   Date Before Breakfast Before Supper   06/09/18 197   11/7 117   11/8 116 137  11/9 188 151  11/10 116 148  11/13 177   11/14  112  11/16 170   12/1 148      HISTORY:  Past Medical History:  Past Medical History:  Diagnosis Date  . Diabetes mellitus without complication ([place])   . Fatty liver 2019   elevated LFTs, negative for Hep B and Hep C 04/2018  . Hypertension   . Hypothyroidism 2019  . IgA nephropathy 2018   Dr. Corliss Parish  . Language barrier   . Obesity   . Proteinuria    Past Surgical History:  Past Surgical History:  Procedure Laterality Date  . RENAL BIOPSY  2018   IgA nephropathy, Dr. Corliss Parish    Social History:  reports that she  has never smoked. She has never used smokeless tobacco. She reports that she does not drink alcohol or use drugs. Family History:  Family History  Problem Relation Age of Onset  . Diabetes Mother   . Hypertension Father      HOME MEDICATIONS: Allergies as of 09/09/2018      Reactions   Lisinopril Swelling   Angioedema       Medication List       Accurate as of September 09, 2018  7:40 AM. Always use your most recent med list.        ACCU-CHEK AVIVA PLUS test strip Generic drug:  glucose blood 1 strip by Other route 2 (two) times daily. for  testing   amLODipine 10 MG tablet Commonly known as:  NORVASC Take 1 tablet (10 mg total) by mouth daily.   cholecalciferol 1000 units tablet Commonly known as:  VITAMIN D Take 2 tablets (2,000 Units total) by mouth daily.   diphenhydrAMINE 25 mg capsule Commonly known as:  BENADRYL Take 1 capsule (25 mg total) by mouth every 12 (twelve) hours for 5 days.   EPINEPHrine 0.3 mg/0.3 mL Soaj injection Commonly known as:  EPI-PEN Only use during anaphylaxis   furosemide 40 MG tablet Commonly known as:  LASIX Take 1 tablet (40 mg total) by mouth 2 (two) times daily. 2 am, 1 pm (3 total daily PER DR. GOLDSBOROUGH!!!   gabapentin 100 MG capsule Commonly known as:  NEURONTIN Take 1 capsule (100 mg total) by mouth at bedtime.   hydrocortisone 2.5 % rectal cream Commonly known as:  ANUSOL-HC Place 1 application rectally 2 (two) times daily.   Insulin Pen Needle 32G X 4 MM Misc 1 each by Does not apply route at bedtime.   levothyroxine 25 MCG tablet Commonly known as:  SYNTHROID, LEVOTHROID Take 1 tablet (25 mcg total) by mouth daily before breakfast.   losartan 50 MG tablet Commonly known as:  COZAAR Take 1 tablet (50 mg total) by mouth daily.   metoprolol succinate 25 MG 24 hr tablet Commonly known as:  TOPROL-XL Take 1 tablet (25 mg total) by mouth daily. PLEASE  DISCARD VALSARTAN ORDER   omeprazole 40 MG capsule Commonly known as:  PRILOSEC Take 1 capsule (40 mg total) by mouth daily.   ondansetron 4 MG tablet Commonly known as:  ZOFRAN Take 1 tablet (4 mg total) by mouth every 8 (eight) hours as needed for nausea or vomiting.   potassium chloride SA 20 MEQ tablet Commonly known as:  K-DUR,KLOR-CON Take 1 tablet (20 mEq total) by mouth 2 (two) times daily.   promethazine 25 MG tablet Commonly known as:  PHENERGAN TAKE 1/2 TO 1 TABLET EVERY 6 HOURS AS NEEDED FOR NAUSEA   rosuvastatin 40 MG tablet Commonly known as:  CRESTOR Take 1 tablet (40 mg total) by mouth  daily.        OBJECTIVE:   Vital Signs: LMP 03/23/2017   Wt Readings from Last 3 Encounters:  07/05/18 180 lb 9.6 oz (81.9 kg)  06/07/18 175 lb 6.4 oz (79.6 kg)  06/03/18 175 lb 3.2 oz (79.5 kg)     Exam: General: Pt appears well and is in NAD  Lungs: Clear with good BS bilat with no rales, rhonchi, or wheezes  Heart: RRR with normal S1 and S2 and no gallops; no murmurs; no rub  Abdomen: Normoactive bowel sounds, soft, nontender, without masses or organomegaly palpable  Extremities: No pretibial edema.  Skin: Normal texture and temperature to  palpation. No rash noted.   Neuro: MS is good with appropriate affect, pt is alert and Ox3     DATA REVIEWED:  Lab Results  Component Value Date   HGBA1C 5.6 05/17/2018   HGBA1C 6.4 (A) 02/08/2018   HGBA1C 6.2 (H) 09/02/2017    Lab Results  Component Value Date   CHOL 145 01/20/2018   HDL 56 01/20/2018   LDLCALC 73 01/20/2018   TRIG 78 01/20/2018   CHOLHDL 2.6 01/20/2018       Results for AMAURIE, SCHRECKENGOST (MRN [id number]) as of 07/06/2018 11:22  Ref. Range 07/05/2018 09:26  TSH Latest Ref Range: 0.35 - 4.50 uIU/mL 3.48  T4,Free(Direct) Latest Ref Range: 0.60 - 1.60 ng/dL 0.63    ASSESSMENT / PLAN / RECOMMENDATIONS:   1) Impaired fasting Glucose:-   - Praised patient on glucose checks and lifestyle changes  - Her fasting glucose is variable and for the most part in the high range of > 130 mg/dlL, her pre-supper glucose is acceptable.  - We will not make any additional changes and continue to monitor glucose.  - She was advised to call us with BG's consistently > 200 mg/dL  - She was advised to continue avoiding snacks and sugar- sweetened beverages  MEDICATIONS:  N/A  EDUCATION / INSTRUCTIONS:  BG monitoring instructions: Patient is instructed to check her blood sugars 2 times a day, fasting and bedtime.  Call [place] [facility] clinic if: BG persistently < 70 or > 300. . I reviewed the Rule of 15 for the  treatment of hypoglycemia in detail with the patient. Literature supplied.  2) Lipids: Patient is on a statin and LDL at 72 mg/dL . No changes   3) Hypertension: She is above goal of < 140/90 mmHg. Will have to repeat renin/aldo ratio,knowing there will be some skewed numbers due to her renal insufficieny but I am concerned due to uncontrolled HTN and consistent hypokalemia.  We can only run renin/aldo ratio if the potassium is normal , preferably > 4.0 mEq/L. Repeat K+ today is lower then it has been at 3.1 mEq/L. Will increase potassium to 20 mEq/L BID  In the meantime, she was advised to start taking  more then 20 mEq of K+ a day , repeat labs in 2 weeks   4.) Hypothyroidism   - Pt is clinically and bio-chemically euthyroid - Pt educated extensively on the correct way to take levothyroxine (first thing in the morning with water, 30 minutes before eating or taking other medications). - Pt encouraged to double dose the following day if she were to miss a dose given long half-life of levothyroxine. - Continue 25 mcg of Levothyroxine    5.) Hypokalemia : - Increase potassium chloride to 20 meq bid  - recheck in 2 weeks   F/U on or after 08/18/2018  Addendum: Discussed potassium levels with daughter on 12/3 @ 11:30 am. She was advised to increase potassium to BID and repeat labs in 2 weeks . Daughter expressed understanding   Signed electronically by: Mack Guise, MD  [facility]  [facility] [address] [place], [place] [address] Phone: [phone] FAX: [phone]   CC: Carlena Hurl, PA-C [place] [address] Phone: [phone]  Fax: [phone]  Return to Endocrinology clinic as below: Future Appointments  Date Time Provider [place]  09/09/2018  9:10 AM Anesha Hackert, Melanie Crazier, MD [id number] None

## 2018-09-10 ENCOUNTER — Encounter: Payer: Self-pay | Admitting: Internal Medicine

## 2018-09-10 ENCOUNTER — Ambulatory Visit (INDEPENDENT_AMBULATORY_CARE_PROVIDER_SITE_OTHER): Payer: Medicaid Other | Admitting: Internal Medicine

## 2018-09-10 VITALS — BP 144/88 | HR 74 | Ht 61.0 in | Wt 186.4 lb

## 2018-09-10 DIAGNOSIS — I10 Essential (primary) hypertension: Secondary | ICD-10-CM

## 2018-09-10 DIAGNOSIS — E1149 Type 2 diabetes mellitus with other diabetic neurological complication: Secondary | ICD-10-CM | POA: Diagnosis not present

## 2018-09-10 LAB — POCT GLYCOSYLATED HEMOGLOBIN (HGB A1C): HEMOGLOBIN A1C: 6.1 % — AB (ref 4.0–5.6)

## 2018-09-10 NOTE — Progress Notes (Signed)
Name: Linda Black  Age/ Sex: 48 y.o., female   MRN/ DOB: [id number], 1970/10/29     PCP: Caryl Ada   Reason for Endocrinology Evaluation: Hyperglycemia  Initial Endocrine Consultative Visit: 06/07/18    PATIENT IDENTIFIER: Linda Black is a 47 y.o. female with a past medical history of HTN, CKD and hypothyroidism. The patient has followed with Endocrinology clinic since 06/07/2018 for consultative assistance with management of her hyperglcyemia.  DIABETIC HISTORY:  Linda Black was diagnosed with pre-diabetes years ago, she was on a diet plan initially . She was diagnosed with IgA nephropathy in 2018 and while on cyclophosphamide and prednisone. In review of her chart, it seems that DM diagnosis appeared in her chart sometime between 2016 and 2017. she was noted to have hyperglycemia and basal insulin was started by PCP. Her hemoglobin A1c has ranged from 5.2% in 2017, peaking at 6.4% in 2019.  ON her initial visit to [place] she was on basal insulin 5-6 units daily    Hypertension :  In review of her records, she has been diagnosed with HTN since 2015. She has been on multiple anti-hypertensive medications over the years, she was also noted to have hypokalemia at some point. She was tried on Lisinopril, B-blockers, spironolactone and hydralazine. She follows with nephrology. Has been off Prednisone and cyclophosphamide.   Patient does have episodic spells of hypertension associated with headaches and palpitations. No triggering factors. She has LE edema . She has not always been compliant with her medications.   SUBJECTIVE:   During the last visit (07/05/2018): Due to questionable diagnosis of DM and an A1c of  5.6% while on basal insulin. We stopped basal insulin and advised the patient to start checking glucose to determine BG's while off prednisone.   Today (09/12/2018): [name] is here for a 8 week DM  follow up. She is here with a [place] interpreter "Lek".   She checks her blood sugars 2 imes daily, preprandial to breakfast and supper. The patient has not had hypoglycemic episodes since the last clinic visit. Otherwise, the patient has not required any recent emergency interventions for hypoglycemia and has not had recent hospitalizations secondary to hyper or hypoglycemic episodes.    She is c/o right upper quadrant pain no exacerbating relieving factors denies nausea and vomiting, she also denies diarrhea She has been without her potassium supplements for the past 3 weeks.      ROS: As per HPI and as detailed below: Review of Systems  Constitutional: Positive for malaise/fatigue. Negative for weight loss.  Eyes: Positive for blurred vision. Negative for pain.  Respiratory: Negative for cough and shortness of breath.   Gastrointestinal: Positive for abdominal pain. Negative for diarrhea.  Skin: Negative for rash.       + hair loss.   Neurological: Positive for tingling and headaches.      HOME DIABETES REGIMEN:  N/A    GLUCOSE LOG:   Date Before Breakfast Before Supper   1/18 130 110  1/19 90 100  1/20 80 90  1/21 100   1/22 97 130  1/23 87 113  1/24 90 99  1/25 80   1/26 166 132     HISTORY:  Past Medical History:  Past Medical History:  Diagnosis Date  . Diabetes mellitus without complication ([place])   . Fatty liver 2019   elevated LFTs, negative for Hep B and Hep C 04/2018  . Hypertension   . Hypothyroidism 2019  .  IgA nephropathy 2018   Dr. Corliss Parish  . Language barrier   . Obesity   . Proteinuria    Past Surgical History:  Past Surgical History:  Procedure Laterality Date  . RENAL BIOPSY  2018   IgA nephropathy, Dr. Corliss Parish    Social History:  reports that she has never smoked. She has never used smokeless tobacco. She reports that she does not drink alcohol or use drugs. Family History:  Family History  Problem  Relation Age of Onset  . Diabetes Mother   . Hypertension Father      HOME MEDICATIONS: Allergies as of 09/10/2018      Reactions   Lisinopril Swelling   Angioedema       Medication List       Accurate as of September 10, 2018 11:59 PM. Always use your most recent med list.        ACCU-CHEK AVIVA PLUS test strip Generic drug:  glucose blood 1 strip by Other route 2 (two) times daily. for testing   amLODipine 10 MG tablet Commonly known as:  NORVASC Take 1 tablet (10 mg total) by mouth daily.   cholecalciferol 1000 units tablet Commonly known as:  VITAMIN D Take 2 tablets (2,000 Units total) by mouth daily.   diphenhydrAMINE 25 mg capsule Commonly known as:  BENADRYL Take 1 capsule (25 mg total) by mouth every 12 (twelve) hours for 5 days.   EPINEPHrine 0.3 mg/0.3 mL Soaj injection Commonly known as:  EPI-PEN Only use during anaphylaxis   furosemide 40 MG tablet Commonly known as:  LASIX Take 1 tablet (40 mg total) by mouth 2 (two) times daily. 2 am, 1 pm (3 total daily PER DR. GOLDSBOROUGH!!!   gabapentin 100 MG capsule Commonly known as:  NEURONTIN Take 1 capsule (100 mg total) by mouth at bedtime.   hydrocortisone 2.5 % rectal cream Commonly known as:  ANUSOL-HC Place 1 application rectally 2 (two) times daily.   Insulin Pen Needle 32G X 4 MM Misc 1 each by Does not apply route at bedtime.   levothyroxine 25 MCG tablet Commonly known as:  SYNTHROID, LEVOTHROID Take 1 tablet (25 mcg total) by mouth daily before breakfast.   losartan 50 MG tablet Commonly known as:  COZAAR Take 1 tablet (50 mg total) by mouth daily.   metoprolol succinate 25 MG 24 hr tablet Commonly known as:  TOPROL-XL Take 1 tablet (25 mg total) by mouth daily. PLEASE  DISCARD VALSARTAN ORDER   omeprazole 40 MG capsule Commonly known as:  PRILOSEC Take 1 capsule (40 mg total) by mouth daily.   ondansetron 4 MG tablet Commonly known as:  ZOFRAN Take 1 tablet (4 mg total) by mouth  every 8 (eight) hours as needed for nausea or vomiting.   potassium chloride SA 20 MEQ tablet Commonly known as:  K-DUR,KLOR-CON Take 1 tablet (20 mEq total) by mouth 2 (two) times daily.   promethazine 25 MG tablet Commonly known as:  PHENERGAN TAKE 1/2 TO 1 TABLET EVERY 6 HOURS AS NEEDED FOR NAUSEA   rosuvastatin 40 MG tablet Commonly known as:  CRESTOR Take 1 tablet (40 mg total) by mouth daily.        OBJECTIVE:   Vital Signs: BP (!) 144/88 (BP Location: Left Arm, Patient Position: Sitting)   Pulse 74   Ht 5\' 1"  (1.549 m)   Wt 186 lb 6.4 oz (84.6 kg)   LMP 03/23/2017   SpO2 98%   BMI 35.22 kg/m  Wt Readings from Last 3 Encounters:  09/10/18 186 lb 6.4 oz (84.6 kg)  07/05/18 180 lb 9.6 oz (81.9 kg)  06/07/18 175 lb 6.4 oz (79.6 kg)     Exam: General: Pt appears well and is in NAD  Lungs: Clear with good BS bilat with no rales, rhonchi, or wheezes  Heart: RRR with normal S1 and S2 and no gallops; no murmurs; no rub  Abdomen: Normoactive bowel sounds, soft, nontender, without masses or organomegaly palpable  Extremities:  Trace pretibial edema.  Skin: Normal texture and temperature to palpation. No rash noted.   Neuro: MS is good with appropriate affect, pt is alert and Ox3     DATA REVIEWED:  Lab Results  Component Value Date   HGBA1C 6.1 (A) 09/10/2018   HGBA1C 5.6 05/17/2018   HGBA1C 6.4 (A) 02/08/2018    Lab Results  Component Value Date   CHOL 145 01/20/2018   HDL 56 01/20/2018   LDLCALC 73 01/20/2018   TRIG 78 01/20/2018   CHOLHDL 2.6 01/20/2018       Results for LACREASHA, HINDS (MRN [id number]) as of 07/06/2018 11:22  Ref. Range 07/05/2018 09:26  TSH Latest Ref Range: 0.35 - 4.50 uIU/mL 3.48  T4,Free(Direct) Latest Ref Range: 0.60 - 1.60 ng/dL 0.63    ASSESSMENT / PLAN / RECOMMENDATIONS:   1) Impaired fasting Glucose:-   - Praised patient on glucose checks and lifestyle changes , she continues to be on a low-carb diet - We will not make  any additional changes and continue to monitor glucose.  - She was advised to call us with BG's consistently > 200 mg/dL  - She was advised to continue avoiding snacks and and eating between meals  MEDICATIONS:  N/A  EDUCATION / INSTRUCTIONS:  BG monitoring instructions: Patient is instructed to check her blood sugars 2 times a day, fasting and bedtime.  Call [facility] if: BG persistently < 70 or > 300. . I reviewed the Rule of 15 for the treatment of hypoglycemia in detail with the patient. Literature supplied.    2) Hypertension:  - She is above goal of < 140/90 mmHg.  Unfortunately I have tried multiple times to check her renin and aldosterone values , knowing there will be some skewed numbers due to her renal insufficieny but I am concerned due to uncontrolled HTN and consistent hypokalemia.  - I have explained to her that we can only run renin/aldo ratio if the potassium is normal , preferably > 4.0 mEq/L.  - Unfortunately she has been noncompliant with her potassium supplements, as she has not taken them in 3 weeks, I explained the importance of compliance with medications, we have contacted the pharmacy and she does have refills available for pickup -Also it has been difficult to get in touch with her and her family members since December 2019, multiple attempts have been made to contact her about her potassium levels and there was no answer and no call back, I explained to the patient importance of communications between ice and her. - I will not be checking renin and aldosterone normal potassium level at this time, unless I know she has been compliant with potassium supplements     F/U in 3 months   Signed electronically by: Mack Guise, MD  [facility]  [address] [place], [place] [address] Phone: [phone] FAX: [phone]   CC: Carlena Hurl, PA-C [place]  [id number] Phone: [phone]  Fax: [phone]  Return to Endocrinology clinic as below: Future Appointments  Date Time Provider [place]  12/13/2018  9:30 AM Shamleffer, Melanie Crazier, MD [id number] None

## 2018-09-23 ENCOUNTER — Encounter: Payer: Self-pay | Admitting: Medical

## 2018-09-23 ENCOUNTER — Ambulatory Visit: Payer: Medicaid Other | Admitting: Medical

## 2018-09-23 VITALS — BP 140/94 | HR 75 | Temp 98.1°F | Resp 16 | Ht 60.0 in | Wt 185.8 lb

## 2018-09-23 DIAGNOSIS — K760 Fatty (change of) liver, not elsewhere classified: Secondary | ICD-10-CM

## 2018-09-23 DIAGNOSIS — N02B9 Other recurrent and persistent immunoglobulin A nephropathy: Secondary | ICD-10-CM

## 2018-09-23 DIAGNOSIS — R1011 Right upper quadrant pain: Secondary | ICD-10-CM

## 2018-09-23 DIAGNOSIS — M545 Low back pain, unspecified: Secondary | ICD-10-CM

## 2018-09-23 DIAGNOSIS — G5793 Unspecified mononeuropathy of bilateral lower limbs: Secondary | ICD-10-CM

## 2018-09-23 DIAGNOSIS — Z79899 Other long term (current) drug therapy: Secondary | ICD-10-CM

## 2018-09-23 DIAGNOSIS — N028 Recurrent and persistent hematuria with other morphologic changes: Secondary | ICD-10-CM

## 2018-09-23 DIAGNOSIS — I10 Essential (primary) hypertension: Secondary | ICD-10-CM

## 2018-09-23 DIAGNOSIS — R079 Chest pain, unspecified: Secondary | ICD-10-CM | POA: Diagnosis not present

## 2018-09-23 DIAGNOSIS — E039 Hypothyroidism, unspecified: Secondary | ICD-10-CM | POA: Diagnosis not present

## 2018-09-23 DIAGNOSIS — E785 Hyperlipidemia, unspecified: Secondary | ICD-10-CM

## 2018-09-23 NOTE — Progress Notes (Addendum)
Subjective:     Patient ID: Linda Black, female   DOB: 05-12-1971, 48 y.o.   MRN: [id number]  HPI Chief Complaint  Patient presents with  . numb feet and hands    numb feet and hands X 1 week   Here with adult daughter for multiple c/o.   Daughter translates.    She notes 2 months of intermittent abdominal pain, RUQ, worse with bending or eating, some nausea.  No vomiting.  Some back pain.   No diarrhea. Has daily BM.    No urinary c/o, no blood in urine, no frequency, no urgency.   She notes some central chest pain when working out or yard work.  Sometimes SOB. Does report associated nausea and sweats.   She reports numbness in feet and hands bilat x 2 weeks.   She has glucose readings, and most are 70-100, but a few over 200, some mid 100s.   Most are at goal fasting.   Past Medical History:  Diagnosis Date  . Diabetes mellitus without complication ([place])   . Fatty liver 2019   elevated LFTs, negative for Hep B and Hep C 04/2018  . Hypertension   . Hypothyroidism 2019  . IgA nephropathy 2018   Dr. Corliss Parish  . Language barrier   . Obesity   . Proteinuria    Past Surgical History:  Procedure Laterality Date  . RENAL BIOPSY  2018   IgA nephropathy, Dr. Corliss Parish    Review of Systems     Objective:   Physical Exam BP (!) 140/94   Pulse 75   Temp 98.1 F (36.7 C) (Oral)   Resp 16   Ht 5' (1.524 m)   Wt 185 lb 12.8 oz (84.3 kg)   LMP 03/23/2017   SpO2 98%   BMI 36.29 kg/m   Wt Readings from Last 3 Encounters:  09/23/18 185 lb 12.8 oz (84.3 kg)  09/10/18 186 lb 6.4 oz (84.6 kg)  07/05/18 180 lb 9.6 oz (81.9 kg)   BP Readings from Last 3 Encounters:  09/23/18 (!) 140/94  09/10/18 (!) 144/88  07/05/18 (!) 142/84   General appearance: alert, no distress, WD/WN, obese vietnamese female Oral cavity: MMM, no lesions Neck: supple, no lymphadenopathy, no thyromegaly, no masses Heart: RRR, normal S1, S2, no murmurs Lungs: CTA bilaterally,  no wheezes, rhonchi, or rales Chest wall nontender Abdomen: +bs, soft, RUQ and epigastric tenderness, otherwise non tender, non distended, no masses, no hepatomegaly, no splenomegaly Back: nontender Pulses: 2+ symmetric, upper and lower extremities, normal cap refill Ext: no edema   EKG Indication, chest pain.  Rate 64 bpm, PR interval 156 ms, QRS 92 ms, QTC 435 ms, axis 51 degrees, normal sinus rhythm.  Compared to October 2019 EKG no acute changes     Assessment:     Encounter Diagnoses  Name Primary?  . RUQ abdominal pain Yes  . Chest pain, unspecified type   . Essential hypertension   . Hypothyroidism, unspecified type   . Fatty liver   . High risk medication use   . IgA nephropathy   . Morbid obesity ([place])   . Hyperlipidemia, unspecified hyperlipidemia type   . Bilateral low back pain without sciatica, unspecified chronicity   . Neuropathy involving both lower extremities        Plan:     Right upper quadrant abdominal pain- she has had this pain in the past, ultrasound from 10/29/2017 and abdomen reviewed and at that time the gallbladder  was normal but the liver suggested by liver disease.  There is still a good chance this could be a gallbladder issue.  I will check hepatic panel today and will set up for ultrasound abdomen again.  I will hold off on the HIDA scan at the moment due to risk to the kidney with radio nuclear dye risk.  She may ultimately need a HIDA scan but we will await ultrasound first.  I reviewed her basic metabolic panel/renal function panel from nephrology from August 11, 2018.  This was done at [facility].   Chest pain- EKG okay today, no acute changes.  Her chest pain could be more associated with the abdominal pain but she also notes some symptoms with exercise.  Is not clear speaking to her whether or not these are more cardiac symptoms are more musculoskeletal related to the physical activity she is doing at the time.  She does have cardiac risk  factors including hypertension, impaired glucose/borderline diabetes, hyperlipidemia, obesity, fatty liver disease along with kidney disease.  She has never seen a cardiologist.  Of note chest x-ray from April 2017 with normal heart size no obvious cardiomegaly.  Consider cardiology referral but for now I think her chest pain is related to the abdominal pain  High blood pressure not at goal.  Per nephrology note from 08/10/2027, they do not have the metoprolol listed.  She says today she is compliant with Lasix 40 mg twice a day, amlodipine 10 mg daily, Cozaar 50 mg daily which was added 08/2018, compliant with potassium 20 mEq twice daily, and she is still taking metoprolol 25 mg twice daily.  Nephrology notes did not have this listed.  Given her kidney function, worsening kidney function, high blood pressure, I am going to send information to nephrology to request the help decide on adjustments to her regimen in light of her blood pressure.  I reviewed her recent endocrinology notes as well.  They are currently managing her impaired glucose and hypothyroidism.  Neuropathy, lower extremity pain-for whatever reason she thought her gabapentin did not have refills but in fact it does have refills.  She will restart this  Similarly to gabapentin, and she did not realize she had refills on her statin, so she will restart this.  Of note the best number to reach this family is [phone] which is her daughter  Linda Black was seen today for numb feet and hands.  Diagnoses and all orders for this visit:  RUQ abdominal pain -     Hepatic function panel -     US Abdomen Complete; Future  Chest pain, unspecified type -     EKG 12-Lead -     Hepatic function panel -     US Abdomen Complete; Future  Essential hypertension  Hypothyroidism, unspecified type  Fatty liver  High risk medication use  IgA nephropathy  Morbid obesity ([facility])  Hyperlipidemia, unspecified hyperlipidemia type  Bilateral low  back pain without sciatica, unspecified chronicity  Neuropathy involving both lower extremities

## 2018-09-23 NOTE — Addendum Note (Signed)
Addended by: Carlena Hurl on: 09/23/2018 09:43 AM   Modules accepted: Level of Service

## 2018-09-24 LAB — HEPATIC FUNCTION PANEL
ALBUMIN: 4.4 g/dL (ref 3.8–4.8)
ALK PHOS: 132 IU/L — AB (ref 39–117)
ALT: 32 IU/L (ref 0–32)
AST: 20 IU/L (ref 0–40)
BILIRUBIN TOTAL: 0.6 mg/dL (ref 0.0–1.2)
BILIRUBIN, DIRECT: 0.14 mg/dL (ref 0.00–0.40)
TOTAL PROTEIN: 7 g/dL (ref 6.0–8.5)

## 2018-09-28 ENCOUNTER — Ambulatory Visit
Admission: RE | Admit: 2018-09-28 | Discharge: 2018-09-28 | Disposition: A | Discharge status: Home / Self Care | Payer: Medicaid Other | Source: Ambulatory Visit | Attending: Medical | Admitting: Medical

## 2018-09-28 DIAGNOSIS — R079 Chest pain, unspecified: Secondary | ICD-10-CM

## 2018-09-28 DIAGNOSIS — R1011 Right upper quadrant pain: Secondary | ICD-10-CM

## 2018-10-04 ENCOUNTER — Other Ambulatory Visit: Payer: Self-pay

## 2018-10-14 ENCOUNTER — Ambulatory Visit: Payer: Medicaid Other | Admitting: Medical

## 2018-10-15 ENCOUNTER — Ambulatory Visit: Payer: Self-pay | Admitting: Medical

## 2018-10-18 ENCOUNTER — Other Ambulatory Visit: Payer: Self-pay

## 2018-10-18 ENCOUNTER — Ambulatory Visit: Payer: Medicaid Other | Admitting: Medical

## 2018-10-18 ENCOUNTER — Telehealth: Payer: Self-pay | Admitting: Medical

## 2018-10-18 ENCOUNTER — Encounter: Payer: Self-pay | Admitting: Medical

## 2018-10-18 VITALS — BP 160/100 | HR 78 | Temp 98.0°F | Resp 16 | Ht 62.0 in | Wt 189.6 lb

## 2018-10-18 DIAGNOSIS — M545 Low back pain, unspecified: Secondary | ICD-10-CM

## 2018-10-18 DIAGNOSIS — J988 Other specified respiratory disorders: Secondary | ICD-10-CM

## 2018-10-18 DIAGNOSIS — B001 Herpesviral vesicular dermatitis: Secondary | ICD-10-CM

## 2018-10-18 DIAGNOSIS — R3129 Other microscopic hematuria: Secondary | ICD-10-CM

## 2018-10-18 DIAGNOSIS — R05 Cough: Secondary | ICD-10-CM

## 2018-10-18 DIAGNOSIS — S39012A Strain of muscle, fascia and tendon of lower back, initial encounter: Secondary | ICD-10-CM | POA: Diagnosis not present

## 2018-10-18 DIAGNOSIS — I10 Essential (primary) hypertension: Secondary | ICD-10-CM

## 2018-10-18 DIAGNOSIS — R059 Cough, unspecified: Secondary | ICD-10-CM

## 2018-10-18 LAB — POCT UA - MICROSCOPIC ONLY

## 2018-10-18 LAB — POCT URINALYSIS DIP (PROADVANTAGE DEVICE)
BILIRUBIN UA: NEGATIVE
Glucose, UA: NEGATIVE mg/dL
Ketones, POC UA: NEGATIVE mg/dL
Leukocytes, UA: NEGATIVE
NITRITE UA: NEGATIVE
PH UA: 6 (ref 5.0–8.0)
Specific Gravity, Urine: 1.02
UUROB: NEGATIVE

## 2018-10-18 NOTE — Patient Instructions (Addendum)
Recommendations:  Rest  Hydrate well with water  You can use Robitussin DM for cough/congestion  Your symptoms of cough /congestion should improve over the next 4-5 days  Regarding back pain, use some gentle stretching  You can use Tylenol for pain  Avoid heavy lifting the next few days  Your blood pressure is high Avoid using Sudafed, phenylephrine or other sinus decongestants over the counter  Make sure you are taking your blood pressure medication every day Your pressure is too high today I will review your recent kidney doctor notes, and we may call back with some other recommendations

## 2018-10-18 NOTE — Telephone Encounter (Signed)
Have her call to get her back in with diabetes specialist for special fasting labs regarding her uncontrolled high blood pressure

## 2018-10-18 NOTE — Progress Notes (Signed)
Subjective:     Patient ID: Linda Black, female   DOB: 14-Jul-1971, 48 y.o.   MRN: [id number]  HPI Chief Complaint  Patient presents with  . allergies    cough, chills, back pain X 3 days   Here for illness.  She reports cough, pain in back and chills.  No fever, but has felt some SOB.  No runny nose, no sneezing.  Mild sore throat.  + sinus pressure, some ear pressure.  No NVD.   No recent travel.  No sick contacts.   Using Tylenol OTC.       Has some low back pain.  Has been doing some work out in the yard this weekend including getting up leaves and prepping for planting a garden.  No specific injury or illness.  No other aggravating or relieving factors. No other complaint.   Past Medical History:  Diagnosis Date  . Diabetes mellitus without complication ([place])   . Fatty liver 2019   elevated LFTs, negative for Hep B and Hep C 04/2018  . Hypertension   . Hypothyroidism 2019  . IgA nephropathy 2018   Dr. Corliss Parish  . Language barrier   . Obesity   . Proteinuria      Review of Systems As in subjective    Objective:   Physical Exam BP (!) 160/100   Pulse 78   Temp 98 F (36.7 C) (Oral)   Resp 16   Ht 5\' 2"  (1.575 m)   Wt 189 lb 9.6 oz (86 kg)   LMP 03/23/2017   SpO2 99%   BMI 34.68 kg/m   BP Readings from Last 3 Encounters:  10/18/18 (!) 160/100  09/23/18 (!) 140/94  09/10/18 (!) 144/88   General appearance: alert, no distress, WD/WN HEENT: normocephalic, sclerae anicteric, TMs pearly, nares with mild clear discharge, mild  erythema, pharynx normal Oral cavity: Left lower lip with small patch of erythema and erosion from recent cold sore, MMM, no lesions Neck: supple, no lymphadenopathy, no thyromegaly, no masses Heart: RRR, normal S1, S2, no murmurs Lungs: CTA bilaterally, no wheezes, rhonchi, or rales Abdomen: +bs, soft, non tender, non distended, no masses, no hepatomegaly, no splenomegaly Mild tenderness in the left lumbar spine paraspinal  region, no midline tenderness noted tenderness.  Mild pain with range of motion which is relatively full.  No rash.  She has 2 over-the-counter pain patches applied to her lower back. Pulses: 2+ symmetric, upper and lower extremities, normal cap refill      Assessment:     Encounter Diagnoses  Name Primary?  Marland Kitchen Respiratory tract infection Yes  . Cough   . Acute bilateral low back pain without sciatica   . Back strain, initial encounter   . Cold sore   . Microscopic hematuria   . Essential hypertension        Plan:     Respiratory tract infection-discussed supportive care, rest, hydration, Robitussin-DM over the counter, avoid other sinus de congestion so they wont raise her blood pressure.  We discussed usual timeframe to see symptoms improved.  If worse or not improving over the next 3 to 4 days then call back  Back strain, low back pain-discussed few days of relative rest, stretching, can use heat pad if desired, and use Tylenol over-the-counter.  Avoid heavy lifting for the next week.  If symptoms do not gradually improve the next several days, then let me know.  microscopic hematuria - no RBCs on micro, but + wbc and  bacteria.  Will send for urine culture.   HTN - f/u with endocrinology and nephrology

## 2018-10-19 ENCOUNTER — Other Ambulatory Visit: Payer: Self-pay | Admitting: Medical

## 2018-10-19 NOTE — Telephone Encounter (Signed)
Patient's son notified of recommendations.   He states he will call and schedule an appointment.

## 2018-10-20 ENCOUNTER — Encounter: Payer: Self-pay | Admitting: Internal Medicine

## 2018-10-20 ENCOUNTER — Other Ambulatory Visit: Payer: Self-pay | Admitting: Medical

## 2018-10-20 LAB — CULTURE, URINE COMPREHENSIVE

## 2018-10-20 MED ORDER — AMOXICILLIN 875 MG PO TABS: 875.0000 mg | ORAL_TABLET | Freq: Two times a day (BID) | ORAL | 0 refills | Status: DC

## 2018-11-25 ENCOUNTER — Encounter: Payer: Self-pay | Admitting: Medical

## 2018-12-13 ENCOUNTER — Other Ambulatory Visit: Payer: Self-pay

## 2018-12-13 ENCOUNTER — Encounter: Payer: Medicaid Other | Admitting: Internal Medicine

## 2018-12-13 NOTE — Progress Notes (Deleted)
Name: Linda Black  Age/ Sex: 48 y.o., female   MRN/ DOB: [id number], [birth year]     PCP: Linda Black   Reason for Endocrinology Evaluation: Hyperglycemia  Initial Endocrine Consultative Visit: 06/07/18    PATIENT IDENTIFIER: Linda Black is a 48 y.o. female with a past medical history of HTN, CKD and hypothyroidism. The patient has followed with Endocrinology clinic since 06/07/2018 for consultative assistance with management of her hyperglcyemia.  DIABETIC HISTORY:  Linda Black was diagnosed with pre-diabetes years ago, she was on a diet plan initially . She was diagnosed with IgA nephropathy in 2018 and while on cyclophosphamide and prednisone. In review of her chart, it seems that DM diagnosis appeared in her chart sometime between 2016 and 2017. she was noted to have hyperglycemia and basal insulin was started by PCP. Her hemoglobin A1c has ranged from 5.2% in 2017, peaking at 6.4% in 2019.  ON her initial visit to [place] she was on basal insulin 5-6 units daily    Hypertension :  In review of her records, she has been diagnosed with HTN since 2015. She has been on multiple anti-hypertensive medications over the years, she was also noted to have hypokalemia at some point. She was tried on Lisinopril, B-blockers, spironolactone and hydralazine. She follows with nephrology. Has been off Prednisone and cyclophosphamide.   Patient does have episodic spells of hypertension associated with headaches and palpitations. No triggering factors. She has LE edema . She has not always been compliant with her medications.   SUBJECTIVE:   During the last visit (09/10/2018): Continued with dietary changes for glucose control. She was non-compliant with potassium intake, hence unable to perform aldo:renin ratio  Today (12/13/2018): Linda Black is here for a 3 month follow up on pre-diabetes care. She is here with a  [place] interpreter "Linda Black".   She checks her blood sugars 2 imes daily, preprandial to breakfast and supper. The patient has not had hypoglycemic episodes since the last clinic visit. Otherwise, the patient has not required any recent emergency interventions for hypoglycemia and has not had recent hospitalizations secondary to hyper or hypoglycemic episodes.    She is c/o right upper quadrant pain no exacerbating relieving factors denies nausea and vomiting, she also denies diarrhea She has been without her potassium supplements for the past 3 weeks.      ROS: As per HPI and as detailed below: Review of Systems  Constitutional: Positive for malaise/fatigue. Negative for weight loss.  Eyes: Positive for blurred vision. Negative for pain.  Respiratory: Negative for cough and shortness of breath.   Gastrointestinal: Positive for abdominal pain. Negative for diarrhea.  Skin: Negative for rash.       + hair loss.   Neurological: Positive for tingling and headaches.      HOME DIABETES REGIMEN:  N/A    GLUCOSE LOG:   Date Before Breakfast Before Supper   1/18 130 110  1/19 90 100  1/20 80 90  1/21 100   1/22 97 130  1/23 87 113  1/24 90 99  1/25 80   1/26 166 132     HISTORY:  Past Medical History:  Past Medical History:  Diagnosis Date  . Diabetes mellitus without complication ([place])   . Fatty liver 2019   elevated LFTs, negative for Hep B and Hep C 04/2018  . Hypertension   . Hypothyroidism 2019  . IgA nephropathy 2018   Linda Black  . Language barrier   .  Obesity   . Proteinuria    Past Surgical History:  Past Surgical History:  Procedure Laterality Date  . RENAL BIOPSY  2018   IgA nephropathy, Linda Black    Social History:  reports that she has never smoked. She has never used smokeless tobacco. She reports that she does not drink alcohol or use drugs. Family History:  Family History  Problem Relation Age of Onset  . Diabetes  Mother   . Hypertension Father      HOME MEDICATIONS: Allergies as of 12/13/2018      Reactions   Lisinopril Swelling   Angioedema       Medication List       Accurate as of Dec 13, 2018  7:54 AM. If you have any questions, ask your nurse or doctor.        Accu-Chek Aviva Plus test strip Generic drug:  glucose blood 1 strip by Other route 2 (two) times daily. for testing   amLODipine 10 MG tablet Commonly known as:  NORVASC Take 1 tablet (10 mg total) by mouth daily.   amoxicillin 875 MG tablet Commonly known as:  AMOXIL Take 1 tablet (875 mg total) by mouth 2 (two) times daily.   cholecalciferol 1000 units tablet Commonly known as:  VITAMIN D Take 2 tablets (2,000 Units total) by mouth daily.   diphenhydrAMINE 25 mg capsule Commonly known as:  BENADRYL Take 1 capsule (25 mg total) by mouth every 12 (twelve) hours for 5 days.   EPINEPHrine 0.3 mg/0.3 mL Soaj injection Commonly known as:  EPI-PEN Only use during anaphylaxis   furosemide 40 MG tablet Commonly known as:  LASIX TAKE 2 TABLETS BY MOUTH EVERY MORNING THEN 1 TABLET EVERY EVENING   gabapentin 100 MG capsule Commonly known as:  Neurontin Take 1 capsule (100 mg total) by mouth at bedtime.   hydrocortisone 2.5 % rectal cream Commonly known as:  Anusol-HC Place 1 application rectally 2 (two) times daily.   Insulin Pen Needle 32G X 4 MM Misc 1 each by Does not apply route at bedtime.   levothyroxine 25 MCG tablet Commonly known as:  SYNTHROID Take 1 tablet (25 mcg total) by mouth daily before breakfast.   losartan 50 MG tablet Commonly known as:  Cozaar Take 1 tablet (50 mg total) by mouth daily.   metoprolol succinate 25 MG 24 hr tablet Commonly known as:  TOPROL-XL Take 1 tablet (25 mg total) by mouth daily. PLEASE  DISCARD VALSARTAN ORDER   omeprazole 40 MG capsule Commonly known as:  PRILOSEC Take 1 capsule (40 mg total) by mouth daily.   ondansetron 4 MG tablet Commonly known as:   Zofran Take 1 tablet (4 mg total) by mouth every 8 (eight) hours as needed for nausea or vomiting.   potassium chloride SA 20 MEQ tablet Commonly known as:  K-DUR Take 1 tablet (20 mEq total) by mouth 2 (two) times daily.   promethazine 25 MG tablet Commonly known as:  PHENERGAN TAKE 1/2 TO 1 TABLET EVERY 6 HOURS AS NEEDED FOR NAUSEA   rosuvastatin 40 MG tablet Commonly known as:  Crestor Take 1 tablet (40 mg total) by mouth daily.        OBJECTIVE:   Vital Signs: LMP 03/23/2017   Wt Readings from Last 3 Encounters:  10/18/18 189 lb 9.6 oz (86 kg)  09/23/18 185 lb 12.8 oz (84.3 kg)  09/10/18 186 lb 6.4 oz (84.6 kg)     Exam: General: Pt appears well  and is in NAD  Lungs: Clear with good BS bilat with no rales, rhonchi, or wheezes  Heart: RRR with normal S1 and S2 and no gallops; no murmurs; no rub  Abdomen: Normoactive bowel sounds, soft, nontender, without masses or organomegaly palpable  Extremities:  Trace pretibial edema.  Skin: Normal texture and temperature to palpation. No rash noted.   Neuro: MS is good with appropriate affect, pt is alert and Ox3     DATA REVIEWED:  Lab Results  Component Value Date   HGBA1C 6.1 (A) 09/10/2018   HGBA1C 5.6 05/17/2018   HGBA1C 6.4 (A) 02/08/2018    Lab Results  Component Value Date   CHOL 145 01/20/2018   HDL 56 01/20/2018   LDLCALC 73 01/20/2018   TRIG 78 01/20/2018   CHOLHDL 2.6 01/20/2018       Results for LAMARI, YOUNGERS (MRN [id number]) as of 07/06/2018 11:22  Ref. Range 07/05/2018 09:26  TSH Latest Ref Range: 0.35 - 4.50 uIU/mL 3.48  T4,Free(Direct) Latest Ref Range: 0.60 - 1.60 ng/dL 0.63    ASSESSMENT / PLAN / RECOMMENDATIONS:   1) Impaired fasting Glucose:-   - Praised patient on glucose checks and lifestyle changes , she continues to be on a low-carb diet - We will not make any additional changes and continue to monitor glucose.  - She was advised to call us with BG's consistently > 200 mg/dL  - She  was advised to continue avoiding snacks and and eating between meals  MEDICATIONS:  N/A  EDUCATION / INSTRUCTIONS:  BG monitoring instructions: Patient is instructed to check her blood sugars 2 times a day, fasting and bedtime.  Call [facility] if: BG persistently < 70 or > 300. . I reviewed the Rule of 15 for the treatment of hypoglycemia in detail with the patient. Literature supplied.    2) Hypertension:  - She is above goal of < 140/90 mmHg.  Unfortunately I have tried multiple times to check her renin and aldosterone values , knowing there will be some skewed numbers due to her renal insufficieny but I am concerned due to uncontrolled HTN and consistent hypokalemia.  - I have explained to her that we can only run renin/aldo ratio if the potassium is normal , preferably > 4.0 mEq/L.  - Unfortunately she has been noncompliant with her potassium supplements, as she has not taken them in 3 weeks, I explained the importance of compliance with medications, we have contacted the pharmacy and she does have refills available for pickup -Also it has been difficult to get in touch with her and her family members since December 2019, multiple attempts have been made to contact her about her potassium levels and there was no answer and no call back, I explained to the patient importance of communications between ice and her. - I will not be checking renin and aldosterone normal potassium level at this time, unless I know she has been compliant with potassium supplements     F/U in 3 months   Signed electronically by: Mack Guise, MD  [facility]  [address] [place], [place] [address] Phone: [phone] FAX: [phone]   CC: Carlena Hurl, PA-C [place] [address] Phone: [phone]  Fax: [phone]  Return to Endocrinology clinic as below: Future Appointments  Date Time  Provider Rosa  12/13/2018  9:30 AM Astaria Nanez, Melanie Crazier, MD [facility] None

## 2019-01-06 ENCOUNTER — Other Ambulatory Visit: Payer: Self-pay | Admitting: Internal Medicine

## 2019-02-17 ENCOUNTER — Other Ambulatory Visit: Payer: Self-pay | Admitting: Internal Medicine

## 2019-02-21 ENCOUNTER — Other Ambulatory Visit: Payer: Self-pay | Admitting: Internal Medicine

## 2019-02-23 ENCOUNTER — Ambulatory Visit: Payer: Medicaid Other | Admitting: Medical

## 2019-02-23 ENCOUNTER — Ambulatory Visit
Admission: RE | Admit: 2019-02-23 | Discharge: 2019-02-23 | Disposition: A | Discharge status: Home / Self Care | Payer: Medicaid Other | Source: Ambulatory Visit | Attending: Medical | Admitting: Medical

## 2019-02-23 ENCOUNTER — Other Ambulatory Visit: Payer: Self-pay

## 2019-02-23 ENCOUNTER — Telehealth: Payer: Self-pay | Admitting: Medical

## 2019-02-23 ENCOUNTER — Encounter: Payer: Self-pay | Admitting: Medical

## 2019-02-23 VITALS — BP 160/100 | HR 71 | Temp 98.1°F | Resp 16 | Ht 62.0 in | Wt 194.6 lb

## 2019-02-23 DIAGNOSIS — G8929 Other chronic pain: Secondary | ICD-10-CM

## 2019-02-23 DIAGNOSIS — I10 Essential (primary) hypertension: Secondary | ICD-10-CM | POA: Diagnosis not present

## 2019-02-23 DIAGNOSIS — M545 Low back pain, unspecified: Secondary | ICD-10-CM

## 2019-02-23 DIAGNOSIS — E039 Hypothyroidism, unspecified: Secondary | ICD-10-CM

## 2019-02-23 DIAGNOSIS — Z789 Other specified health status: Secondary | ICD-10-CM

## 2019-02-23 DIAGNOSIS — N028 Recurrent and persistent hematuria with other morphologic changes: Secondary | ICD-10-CM

## 2019-02-23 DIAGNOSIS — M25561 Pain in right knee: Secondary | ICD-10-CM

## 2019-02-23 DIAGNOSIS — K760 Fatty (change of) liver, not elsewhere classified: Secondary | ICD-10-CM

## 2019-02-23 DIAGNOSIS — R7301 Impaired fasting glucose: Secondary | ICD-10-CM

## 2019-02-23 DIAGNOSIS — R1011 Right upper quadrant pain: Secondary | ICD-10-CM

## 2019-02-23 MED ORDER — HYDROCODONE-ACETAMINOPHEN 5-325 MG PO TABS: 1.0000 | ORAL_TABLET | Freq: Four times a day (QID) | ORAL | 0 refills | Status: DC | PRN

## 2019-02-23 NOTE — Addendum Note (Signed)
Addended by: Carlena Hurl on: 02/23/2019 01:26 PM   Modules accepted: Orders

## 2019-02-23 NOTE — Telephone Encounter (Signed)
Please set patient up for HIDA scan gall bladder test through [facility] hospital.  Call [phone] for scheduling.  The actual nuclear medicine department number is [phone].  Symptoms include right upper quadrant pain, pain with eating, nausea.

## 2019-02-23 NOTE — Telephone Encounter (Signed)
Faxed notes for prior approval to [employer].

## 2019-02-23 NOTE — Progress Notes (Addendum)
Subjective: Chief Complaint  Patient presents with  . right side pain    right knee, back and side pain X 1 week   Here with daughter who interprets.   She c/o right knee pain x 1 week.  No injury, no trauma.  No swelling.  No pop.     Back been hurting about a week, hurts to bend down or walk.   Again, no injury, no trauma.  No burning with urination, no blood in urine.  No fever.     She reports pain in right upper abdomen, worse with eating x last week.  No nausea or vomiting.  Within 10 minutes of eating.   Pain lasts for about an hour.  Feels like something is poking her.  No other aggravating relieving factors.  Is the same issue we discussed at her last visit a few months ago.  At that time I had planned to set her up for HIDA scan.  She does not recall [place] discussing this.  So somehow this got lost to follow-up  Although she is not here today to discuss blood pressure or other issues, her blood pressure is high today.  In looking over her medications, she apparently is not taking her amlodipine.  Her blood pressure is managed by nephrology.  She says that she just saw them in June and had labs with no medications adjusted.  She denies chest pain, difficulty breathing, no new swelling.  Her daughter notes that she is eating 2 or 3 meals daily, eats rice every meal but says some days she may just eat 1 bowl of rice all day, she eats a lot of fruit.  She denies eating a lot of fast food or fried food, mostly eats at home.  She works a lot out in the garden.   Past Medical History:  Diagnosis Date  . Diabetes mellitus without complication ([place])   . Fatty liver 2019   elevated LFTs, negative for Hep B and Hep C 04/2018  . Hypertension   . Hypothyroidism 2019  . IgA nephropathy 2018   Dr. Corliss Parish  . Language barrier   . Obesity   . Proteinuria    Past Surgical History:  Procedure Laterality Date  . RENAL BIOPSY  2018   IgA nephropathy, Dr. Corliss Parish    Current Outpatient Medications on File Prior to Visit  Medication Sig Dispense Refill  . ACCU-CHEK AVIVA PLUS test strip 1 strip by Other route 2 (two) times daily. for testing  4  . cholecalciferol (VITAMIN D) 1000 units tablet Take 2 tablets (2,000 Units total) by mouth daily. 180 tablet 3  . EPINEPHrine 0.3 mg/0.3 mL IJ SOAJ injection Only use during anaphylaxis 1 Device 0  . furosemide (LASIX) 40 MG tablet TAKE 2 TABLETS BY MOUTH EVERY MORNING THEN 1 TABLET EVERY EVENING 270 tablet 0  . Insulin Pen Needle 32G X 4 MM MISC 1 each by Does not apply route at bedtime. 100 each 11  . levothyroxine (SYNTHROID, LEVOTHROID) 25 MCG tablet Take 1 tablet (25 mcg total) by mouth daily before breakfast. 30 tablet 11  . losartan (COZAAR) 50 MG tablet Take 1 tablet (50 mg total) by mouth daily. 30 tablet 0  . metoprolol succinate (TOPROL-XL) 25 MG 24 hr tablet TAKE 1 TABLET(25 MG) BY MOUTH DAILY. DISCARD VALSARTAN ORDER 90 tablet 1  . potassium chloride SA (K-DUR) 20 MEQ tablet TAKE 1 TABLET(20 MEQ) BY MOUTH TWICE DAILY 60 tablet 3  . rosuvastatin (  CRESTOR) 40 MG tablet Take 1 tablet (40 mg total) by mouth daily. 90 tablet 3  . amLODipine (NORVASC) 10 MG tablet Take 1 tablet (10 mg total) by mouth daily. (Patient not taking: Reported on 02/23/2019) 90 tablet 3  . diphenhydrAMINE (BENADRYL) 25 mg capsule Take 1 capsule (25 mg total) by mouth every 12 (twelve) hours for 5 days. 10 capsule 0  . gabapentin (NEURONTIN) 100 MG capsule Take 1 capsule (100 mg total) by mouth at bedtime. (Patient not taking: Reported on 02/23/2019) 30 capsule 11  . hydrocortisone (ANUSOL-HC) 2.5 % rectal cream Place 1 application rectally 2 (two) times daily. (Patient not taking: Reported on 07/05/2018) 30 g 0  . omeprazole (PRILOSEC) 40 MG capsule Take 1 capsule (40 mg total) by mouth daily. (Patient not taking: Reported on 02/23/2019) 90 capsule 3  . ondansetron (ZOFRAN) 4 MG tablet Take 1 tablet (4 mg total) by mouth every 8 (eight)  hours as needed for nausea or vomiting. (Patient not taking: Reported on 07/05/2018) 20 tablet 0  . promethazine (PHENERGAN) 25 MG tablet TAKE 1/2 TO 1 TABLET EVERY 6 HOURS AS NEEDED FOR NAUSEA (Patient not taking: Reported on 07/05/2018) 20 tablet 0   No current facility-administered medications on file prior to visit.    ROS as in subjective   Objective: BP (!) 160/100   Pulse 71   Temp 98.1 F (36.7 C) (Oral)   Resp 16   Ht 5\' 2"  (1.575 m)   Wt 194 lb 9.6 oz (88.3 kg)   LMP 03/23/2017   SpO2 98%   BMI 35.59 kg/m   Wt Readings from Last 3 Encounters:  02/23/19 194 lb 9.6 oz (88.3 kg)  10/18/18 189 lb 9.6 oz (86 kg)  09/23/18 185 lb 12.8 oz (84.3 kg)   BP Readings from Last 3 Encounters:  02/23/19 (!) 160/100  10/18/18 (!) 160/100  09/23/18 (!) 140/94   General: Well-developed well-nourished no acute distress Abdomen: Tender right upper quadrant, no mass no rebound no organomegaly, positive bowel sounds Back tender in the right paraspinal region, mild pain with range of motion which seems mostly full, no scoliosis no deformity no swelling no rash Right knee seems to be tender in general mildly, there is a click noted with McMurray's, she also seems to be in pain with varus and valgus stress but no obvious laxity, no swelling otherwise leg nontender no other deformity Legs otherwise nontender normal range of motion No obvious swelling in her legs Pulses 1+ lower extremity Legs with normal DTRs and sensation, normal leg straight leg raise, normal leg strength    Assessment: Encounter Diagnoses  Name Primary?  . RUQ pain Yes  . Chronic right-sided low back pain, unspecified whether sciatica present   . Acute pain of right knee   . Essential hypertension   . Hypothyroidism, unspecified type   . IgA nephropathy   . Language barrier   . Morbid obesity ([place])   . Fatty liver   . Impaired fasting blood sugar   . Right upper quadrant pain      Plan: Right upper  quadrant pain- somehow she got lost to follow-up back in February when we had planned to get a HIDA scan.  I assume with the Covid pandemic that the scheduling was postponed.  We will pursue this again because I still suspect gallbladder as the issue.  Chronic right sided low back pain, likely musculoskeletal.  We discussed the need to lose weight to work on regular stretching  and exercise.  She can use short-term medication as below.  I reviewed her 2016 lumbar spine x-ray showing mild degenerative change in L3-4.  Acute right knee pain- we will send for x-ray.  She seemed to be tender in general but no swelling, no precipitating factor or injury.  There was a little bit of a click with McMurray's.  So she may ultimately need to see orthopedist  Her blood pressure is not under control-in reviewing her medications she has with her today she apparently is not taking amlodipine.  I do not have her most recent note from [facility] from June or her most recent labs from June.  Based on her April 2020 nephrology note she is supposed to be on amlodipine as well.  She seems to be compliant with the rest of her medications though.  Likewise she is supposed to have had follow-up with endocrinology including some renal aldosterone testing.  Advise she make that follow-up.  We will make contact with nephrology about her uncontrolled blood pressure as they manage this.  I advise she make follow-up with both endocrinology and kidney doctor  Morbid obesity, fatty liver disease- we discussed the need to make significant changes in weight loss.  We discussed the significance of fatty liver disease and long-term complications with this.  I advised that she needs to work on both exercise and calorie reduction.  We discussed that she needs to continue to eat 3 meals a day but to work on calorie changes.  We discussed some strategies to deal with this.  She may need to see weight management clinic   Faiga was seen  today for right side pain.  Diagnoses and all orders for this visit:  RUQ pain  Chronic right-sided low back pain, unspecified whether sciatica present  Acute pain of right knee -     DG Knee Complete 4 Views Right; Future  Essential hypertension  Hypothyroidism, unspecified type  IgA nephropathy  Language barrier  Morbid obesity ([place])  Fatty liver  Impaired fasting blood sugar  Right upper quadrant pain -     NM Hepato W/Eject Fract; Future  Other orders -     HYDROcodone-acetaminophen (NORCO) 5-325 MG tablet; Take 1 tablet by mouth every 6 (six) hours as needed for moderate pain.

## 2019-02-23 NOTE — Telephone Encounter (Signed)
Patient's daughter notified of xray.  Notes faxed to [facility].

## 2019-02-23 NOTE — Telephone Encounter (Signed)
Call her daughter [name] at [phone]  I would like her to go have a right knee x-ray [facility]  See separate message about the HIDA scan that we need to set up.  I had ordered this back in February but I believe the COVID-19 pandemic interfere with the scheduling of this back in February  Let her know that sent some medicine to help with pain to the pharmacy, and let her know that she is due for follow-up with the endocrine doctor too  Please send my office note today to Dr. Sheilah Pigeon kidney, and please call her office and let their nurse know to be advised that her blood pressure is not under control and that she apparently has not been taking the amlodipine.  I am not sure if she is confused about her medications or if she just simply ran out

## 2019-02-23 NOTE — Telephone Encounter (Signed)
I called and spoke to her pharmacy today just to verify that they did have her blood pressure medicines on file active and they do in fact have these on file including amlodipine.   I asked them to remove me as being the primary person prescribing the blood pressure medications as Dr.Goldsborough, her nephrologist has been the primary one refilling those medications.  This way we can avoid confusion for future refills on her blood pressure medications.  I advised a sending future refill request for blood pressure medicine to Dr. Moshe Cipro.  She does have active refills currently

## 2019-03-21 ENCOUNTER — Other Ambulatory Visit: Payer: Self-pay

## 2019-03-21 ENCOUNTER — Ambulatory Visit: Payer: Medicaid Other | Admitting: Medical

## 2019-03-21 VITALS — BP 120/94 | HR 77 | Temp 98.4°F | Resp 16 | Ht 63.0 in | Wt 191.2 lb

## 2019-03-21 DIAGNOSIS — M79604 Pain in right leg: Secondary | ICD-10-CM

## 2019-03-21 DIAGNOSIS — M545 Low back pain, unspecified: Secondary | ICD-10-CM

## 2019-03-21 DIAGNOSIS — R1011 Right upper quadrant pain: Secondary | ICD-10-CM | POA: Diagnosis not present

## 2019-03-21 DIAGNOSIS — M5417 Radiculopathy, lumbosacral region: Secondary | ICD-10-CM

## 2019-03-21 MED ORDER — HYDROCODONE-ACETAMINOPHEN 5-325 MG PO TABS: 1.0000 | ORAL_TABLET | Freq: Four times a day (QID) | ORAL | 0 refills | Status: DC | PRN

## 2019-03-21 NOTE — Progress Notes (Signed)
Subjective: Chief Complaint  Patient presents with  . leg pain    right leg pain down leg    Here today with interpreter and daughter.   She has ongoing pain in her right low back going down her right leg.  Pain can radiate all the way down to the foot.  Also gets tingling in the leg.  This is unchanged since her last visit here.  She denies injury or fall or trauma.  The pain just started getting worse out of nowhere weeks ago.  She denies any other aggravating or relieving factors.  No incontinence, no blood in the stool, no fever, no saddle anesthesia.   She wants to exercise to help her diabetes, and she wants to be able to work out in her garden but cannot do either of these things now as it even hurts to walk.  It hurts to push a buggy at the grocery store.  No other aggravating or relieving factors. No other complaint.  Past Medical History:  Diagnosis Date  . Diabetes mellitus without complication ([place])   . Fatty liver 2019   elevated LFTs, negative for Hep B and Hep C 04/2018  . Hypertension   . Hypothyroidism 2019  . IgA nephropathy 2018   Dr. Corliss Parish  . Language barrier   . Obesity   . Proteinuria    Current Outpatient Medications on File Prior to Visit  Medication Sig Dispense Refill  . ACCU-CHEK AVIVA PLUS test strip 1 strip by Other route 2 (two) times daily. for testing  4  . amLODipine (NORVASC) 10 MG tablet Take 1 tablet (10 mg total) by mouth daily. 90 tablet 3  . EPINEPHrine 0.3 mg/0.3 mL IJ SOAJ injection Only use during anaphylaxis 1 Device 0  . furosemide (LASIX) 40 MG tablet TAKE 2 TABLETS BY MOUTH EVERY MORNING THEN 1 TABLET EVERY EVENING 270 tablet 0  . Insulin Pen Needle 32G X 4 MM MISC 1 each by Does not apply route at bedtime. 100 each 11  . levothyroxine (SYNTHROID, LEVOTHROID) 25 MCG tablet Take 1 tablet (25 mcg total) by mouth daily before breakfast. 30 tablet 11  . losartan (COZAAR) 50 MG tablet Take 1 tablet (50 mg total) by mouth daily. 30  tablet 0  . metoprolol succinate (TOPROL-XL) 25 MG 24 hr tablet TAKE 1 TABLET(25 MG) BY MOUTH DAILY. DISCARD VALSARTAN ORDER 90 tablet 1  . rosuvastatin (CRESTOR) 40 MG tablet Take 1 tablet (40 mg total) by mouth daily. 90 tablet 3  . cholecalciferol (VITAMIN D) 1000 units tablet Take 2 tablets (2,000 Units total) by mouth daily. (Patient not taking: Reported on 03/21/2019) 180 tablet 3  . diphenhydrAMINE (BENADRYL) 25 mg capsule Take 1 capsule (25 mg total) by mouth every 12 (twelve) hours for 5 days. 10 capsule 0  . gabapentin (NEURONTIN) 100 MG capsule Take 1 capsule (100 mg total) by mouth at bedtime. (Patient not taking: Reported on 02/23/2019) 30 capsule 11  . hydrocortisone (ANUSOL-HC) 2.5 % rectal cream Place 1 application rectally 2 (two) times daily. (Patient not taking: Reported on 07/05/2018) 30 g 0  . omeprazole (PRILOSEC) 40 MG capsule Take 1 capsule (40 mg total) by mouth daily. (Patient not taking: Reported on 02/23/2019) 90 capsule 3  . ondansetron (ZOFRAN) 4 MG tablet Take 1 tablet (4 mg total) by mouth every 8 (eight) hours as needed for nausea or vomiting. (Patient not taking: Reported on 07/05/2018) 20 tablet 0  . potassium chloride SA (K-DUR) 20 MEQ  tablet TAKE 1 TABLET(20 MEQ) BY MOUTH TWICE DAILY (Patient not taking: Reported on 03/21/2019) 60 tablet 3  . promethazine (PHENERGAN) 25 MG tablet TAKE 1/2 TO 1 TABLET EVERY 6 HOURS AS NEEDED FOR NAUSEA (Patient not taking: Reported on 07/05/2018) 20 tablet 0   No current facility-administered medications on file prior to visit.    ROS as in subjective     Objective: BP (!) 120/94   Pulse 77   Temp 98.4 F (36.9 C) (Temporal)   Resp 16   Ht 5\' 3"  (1.6 m)   Wt 191 lb 3.2 oz (86.7 kg)   LMP 03/23/2017   SpO2 98%   BMI 33.87 kg/m   Gen: wd, wn, nad Skin unremarkable Back: Tender in the right lumbar spinal region paraspinal, tender in the right buttock, otherwise back nontender no deformity.  Range of motion is limited due to  pain, only seems to have about 50% range of motion Right leg with some generalized right knee tenderness but otherwise nontender to palpation, range of motion seems normal, no swelling Strength seems okay in both legs but unable to do heel or toe walk on the right, positive straight leg raise at 30 degrees on the right, DTRs seem to be 1+ bilateral lower extremities     Assessment: Encounter Diagnoses  Name Primary?  . Lumbar back pain Yes  . Radiculopathy of lumbosacral region   . Right leg pain   . RUQ abdominal pain     Plan: Lumbar back pain, radiculopathy, right leg pain-I reviewed her 2016 lumbar spine x-ray that showed mild degenerative disc disease.  I suspect this is a flareup possibly related to pinched nerve or bulging disc or facet degenerative changes.  We will send to physical therapy initially.  She can use Tylenol over-the-counter for pain as needed or for worse pain and use the hydrocodone prescribed today.  We discussed the risk and benefits and proper use of this medicine.  Right upper quadrant pain- we are still waiting on approval of the HIDA scan but ran into some insurance issues with this.  I advised that she should be getting a call about this appointment pending insurance clearance.  I still suspect her gallbladder is the culprit of her ongoing chronic right upper quadrant abdominal pain.   Linda Black was seen today for leg pain.  Diagnoses and all orders for this visit:  Lumbar back pain -     Ambulatory referral to Physical Therapy  Radiculopathy of lumbosacral region -     Ambulatory referral to Physical Therapy  Right leg pain -     Ambulatory referral to Physical Therapy  RUQ abdominal pain  Other orders -     HYDROcodone-acetaminophen (NORCO) 5-325 MG tablet; Take 1 tablet by mouth every 6 (six) hours as needed for moderate pain.

## 2019-03-21 NOTE — Patient Instructions (Addendum)
Encounter Diagnoses  Name Primary?  . Lumbar back pain Yes  . Radiculopathy of lumbosacral region   . Right leg pain   . RUQ abdominal pain    Your back pain and leg pain symptoms suggest sciatica or possibly a pinched nerve in your lumbar spine  We will refer you to physical therapy to do treatment as well as they will give you stretches and exercises to do at home  Typically this is 2 times a week for 4 weeks  If you are not any improved at all within a month then we would pursue an MRI of your lumbar spine to see if there is a ruptured disc or other abnormality  You can use Tylenol for pain over-the-counter as needed, or if worse pain you can use the hydrocodone I gave you today.  Hydrocodone is a narcotic and is a controlled substance so we have to limit this use  Do not use any anti-inflammatory given potential damage to your kidney.  Do not use ibuprofen, Aleve, Advil, Motrin, Naprosyn etc.   Regarding your right upper quadrant pain, I have ordered a HIDA scan test on your gallbladder.  There was an insurance issue with the order of this test.  We are waiting to get covered by insurance  As soon as we have clearance for this test we will call you with an appointment time     Lumbosacral Radiculopathy Lumbosacral radiculopathy is a condition that involves the spinal nerves and nerve roots in the low back and bottom of the spine. The condition develops when these nerves and nerve roots move out of place or become inflamed and cause symptoms. What are the causes? This condition may be caused by:  Pressure from a disk that bulges out of place (herniated disk). A disk is a plate of soft cartilage that separates bones in the spine.  Disk changes that occur with age (disk degeneration).  A narrowing of the bones of the lower back (spinal stenosis).  A tumor.  An infection.  An injury that places sudden pressure on the disks that cushion the bones of your lower spine. What  increases the risk? You are more likely to develop this condition if:  You are a female who is 49-70 years old.  You are a female who is 2-6 years old.  You use improper technique when lifting things.  You are overweight or live a sedentary lifestyle.  Your work requires frequent lifting.  You smoke.  You do repetitive activities that strain the spine. What are the signs or symptoms? Symptoms of this condition include:  Pain that goes down from your back into your legs (sciatica), usually on one side of the body. This is the most common symptom. The pain may be worse with sitting, coughing, or sneezing.  Pain and numbness in your legs.  Muscle weakness.  Tingling.  Loss of bladder control or bowel control. How is this diagnosed? This condition may be diagnosed based on:  Your symptoms and medical history.  A physical exam. If the pain is lasting, you may have tests, such as:  MRI scan.  X-ray.  CT scan.  A type of X-ray used to examine the spinal canal after injecting a dye into your spine (myelogram).  A test to measure how electrical impulses move through a nerve (nerve conduction study). How is this treated? Treatment may depend on the cause of the condition and may include:  Working with a Community education officer.  Taking pain  medicine.  Applying heat and ice to affected areas.  Doing stretches to improve flexibility.  Doing exercises to strengthen back muscles.  Having chiropractic spinal manipulation.  Using transcutaneous electrical nerve stimulation (TENS) therapy.  Getting a steroid injection in the spine. In some cases, no treatment is needed. If the condition is long-lasting (chronic), or if symptoms are severe, treatment may involve surgery or lifestyle changes, such as following a weight-loss plan. Follow these instructions at home: Activity  Avoid bending and other activities that make the problem worse.  Maintain a proper position when  standing or sitting: ? When standing, keep your upper back and neck straight, with your shoulders pulled back. Avoid slouching. ? When sitting, keep your back straight and relax your shoulders. Do not round your shoulders or pull them backward.  Do not sit or stand in one place for long periods of time.  Take brief periods of rest throughout the day. This will reduce your pain. It is usually better to rest by lying down or standing, not sitting.  When you are resting for longer periods, mix in some mild activity or stretching between periods of rest. This will help to prevent stiffness and pain.  Get regular exercise. Ask your health care provider what activities are safe for you. If you were shown how to do any exercises or stretches, do them as directed by your health care provider.  Do not lift anything that is heavier than 10 lb (4.5 kg) or the limit that you are told by your health care provider. Always use proper lifting technique, which includes: ? Bending your knees. ? Keeping the load close to your body. ? Avoiding twisting. Managing pain  If directed, put ice on the affected area: ? Put ice in a plastic bag. ? Place a towel between your skin and the bag. ? Leave the ice on for 20 minutes, 2-3 times a day.  If directed, apply heat to the affected area as often as told by your health care provider. Use the heat source that your health care provider recommends, such as a moist heat pack or a heating pad. ? Place a towel between your skin and the heat source. ? Leave the heat on for 20-30 minutes. ? Remove the heat if your skin turns bright red. This is especially important if you are unable to feel pain, heat, or cold. You may have a greater risk of getting burned.  Take over-the-counter and prescription medicines only as told by your health care provider. General instructions  Sleep on a firm mattress in a comfortable position. Try lying on your side with your knees slightly  bent. If you lie on your back, put a pillow under your knees.  Do not drive or use heavy machinery while taking prescription pain medicine.  If your health care provider prescribed a diet or exercise program, follow it as directed.  Keep all follow-up visits as told by your health care provider. This is important. Contact a health care provider if:  Your pain does not improve over time, even when taking pain medicines. Get help right away if:  You develop severe pain.  Your pain suddenly gets worse.  You develop increasing weakness in your legs.  You lose the ability to control your bladder or bowel.  You have difficulty walking or balancing.  You have a fever. Summary  Lumbosacral radiculopathy is a condition that occurs when the spinal nerves and nerve roots in the lower part of the spine  move out of place or become inflamed and cause symptoms.  Symptoms include pain, numbness, and tingling that go down from your back into your legs (sciatica), muscle weakness, and loss of bladder control or bowel control.  If directed, apply ice or heat to the affected area as told by your health care provider.  Follow instructions about activity, rest, and proper lifting technique. This information is not intended to replace advice given to you by your health care provider. Make sure you discuss any questions you have with your health care provider. Document Released: 07/21/2005 Document Revised: 07/09/2017 Document Reviewed: 07/09/2017 Elsevier Patient Education  [place] in [place]  Cc tri?u ch?ng ?au l?ng v ?au chn cho th?y b?n b? ?au th?n kinh t?a ho?c c th? l dy th?n kinh b? chn p ? c?t s?ng th?t l?ng c?a b?n Chng ti s? gi?i thi?u cho b?n v?t l tr? li?u ?? th?c hi?n ?i?u tr? c?ng nh? h? s? cung c?p cho b?n cc ??ng tc ko gin v t?p luy?n t?i nh Thng th??ng, ?i?u ny l 2 l?n m?t tu?n trong 4 tu?n N?u b?n khng c?i thi?n cht no trong  vng m?t thng th chng ti s? ti?n hnh ch?p MRI c?t s?ng th?t l?ng c?a b?n ?? xem li?u c b? v? ??a ??m hay b?t th??ng khc khng B?n c th? s? d?ng Tylenol ?? gi?m ?au khng k ??n khi c?n thi?t ho?c n?u c?n ?au n?ng h?n, b?n c th? s? d?ng hydrocodone m ti ? ??a cho b?n hm nay. Hydrocodone l m?t ch?t gy nghi?n v l m?t ch?t ???c ki?m sot v v?y chng ta ph?i h?n ch? s? d?ng ch?t ny Khng s? d?ng b?t k? ch?t ch?ng vim no v c th? gy h?i cho th?n c?a b?n. Khng s? d?ng ibuprofen, Aleve, Advil, Motrin, Naprosyn, v.v.   V? c?n ?au h? s??n ph?i c?a b?n, ti ? yu c?u lm xt nghi?m HIDA trn ti m?t c?a b?n. ? c v?n ?? v? b?o hi?m v?i th? t? c?a bi ki?m tra ny. Chng ti ?ang ch? ??i ?? ???c b?o hi?m  Ngay sau khi chng ti c gi?y php cho bi ki?m tra ny, chng ti s? g?i cho b?n ?? h?n th?i gian

## 2019-03-24 ENCOUNTER — Other Ambulatory Visit: Payer: Self-pay

## 2019-03-24 ENCOUNTER — Ambulatory Visit: Payer: Medicaid Other | Attending: Medical | Admitting: Physical Therapy

## 2019-03-24 ENCOUNTER — Encounter: Payer: Self-pay | Admitting: Physical Therapy

## 2019-03-24 DIAGNOSIS — M79604 Pain in right leg: Secondary | ICD-10-CM | POA: Insufficient documentation

## 2019-03-24 DIAGNOSIS — M545 Low back pain, unspecified: Secondary | ICD-10-CM

## 2019-03-24 NOTE — Therapy (Signed)
[name], [name], [address] Phone: [phone]   Fax:  [phone]  Physical Therapy Evaluation  Patient Details  Name: Linda Black MRN: [id number] Date of Birth: September 14, 1970 Referring Provider (PT): Glade Lloyd Camelia Eng, PA-C   Encounter Date: 03/24/2019  PT End of Session - 03/24/19 1206    Visit Number  1    Authorization Type  MCD- auth submitted 8/20    PT Start Time  1155    PT Stop Time  1227    PT Time Calculation (min)  32 min    Activity Tolerance  Patient tolerated treatment well    Behavior During Therapy  [facility] for tasks assessed/performed       Past Medical History:  Diagnosis Date  . Diabetes mellitus without complication ([place])   . Fatty liver 2019   elevated LFTs, negative for Hep B and Hep C 04/2018  . Hypertension   . Hypothyroidism 2019  . IgA nephropathy 2018   Dr. Corliss Parish  . Language barrier   . Obesity   . Proteinuria     Past Surgical History:  Procedure Laterality Date  . RENAL BIOPSY  2018   IgA nephropathy, Dr. Corliss Parish    There were no vitals filed for this visit.   Subjective Assessment - 03/24/19 1202    Subjective  About 3 weeks ago began having pain of insidious onset. Pain began in back and spread down Right leg. Had pain similar about 4 years ago but it reduced with medications. Notices Right knee swells.    How long can you stand comfortably?  shortly after walking pain gets worse in her knee on Right    Patient Stated Goals  decrease pain, standing/walking    Currently in Pain?  Yes    Pain Location  Back    Pain Orientation  Right;Lower    Pain Descriptors / Indicators  --   stopping/knotting   Pain Onset  1 to 4 weeks ago    Pain Frequency  Constant    Aggravating Factors   standing    Pain Relieving Factors  unknown         [facility] PT Assessment - 03/24/19 0001      Assessment   Medical Diagnosis  LBP    Referring Provider  (PT)  Tysinger, Camelia Eng, PA-C    Onset Date/Surgical Date  --   3 weeks ago   Hand Dominance  Right      Precautions   Precautions  None      Restrictions   Weight Bearing Restrictions  No      Balance Screen   Has the patient fallen in the past 6 months  No      [place] residence    Living Arrangements  Children    Additional Comments  no stairs      Prior Function   Level of [place]  Retired      Associate Professor   Overall Cognitive Status  Within Functional Limits for tasks assessed      Sensation   Additional Comments  reports decreased sensation in Rt LE      ROM / Strength   AROM / PROM / Strength  AROM;PROM;Strength      AROM   AROM Assessment Site  Lumbar    Lumbar Flexion  pain    Lumbar - Right Side Bend  pain      Strength   Strength Assessment Site  Hip    Right/Left Hip  Right;Left    Right Hip Flexion  4/5   pain   Right Hip Extension  3+/5    Right Hip ABduction  4+/5    Left Hip Flexion  4/5    Left Hip Extension  3+/5    Left Hip ABduction  5/5      Palpation   Palpation comment  only mild TTP at L5, concordant pain at Rt SIJ and piriformis      Special Tests   Other special tests  SLR- pt reports increased numbness during SLR on Rt side                Objective measurements completed on examination: See above findings.      [facility] PT Treatment/Exercise - 03/24/19 0001      Exercises   Exercises  Lumbar      Lumbar Exercises: Stretches   Lower Trunk Rotation  5 reps   both directions,5s holds   Figure 4 Stretch Limitations  press & pull knee      Manual Therapy   Manual therapy comments  pt applies, PT educated STM with tennis ball at wall             PT Education - 03/24/19 1326    Education Details  anatomy of condition, POC, HEP, exercise form/rationale    Person(s) Educated  Patient   via interpreter   Methods   Explanation;Demonstration;Tactile cues;Verbal cues;Handout    Comprehension  Verbalized understanding;Returned demonstration;Verbal cues required;Tactile cues required;Need further instruction       PT Short Term Goals - 03/24/19 1319      PT SHORT TERM GOAL #1   Title  Pt will be able to perofrm light household chores    Baseline  unable to complete any at eval due to pain    Time  3    Period  Weeks    Status  New    [place] Date  04/15/19      PT SHORT TERM GOAL #2   Title  Pt will demo proper form with short term HEP as it has been established    Baseline  began creating at eval    Time  3    Period  Weeks    Status  New    Target Date  04/15/19      PT SHORT TERM GOAL #3   Title  Pt will be able to complete short walks for exercise    Baseline  unable due to pain at eval    Time  3    Period  Weeks    Status  New    Target Date  04/15/19        PT Long Term Goals - 03/24/19 1322      PT LONG TERM GOAL #1   Title  to be set at [place] - 03/24/19 1313    Clinical Impression Statement  Pt presents to PT with complaints of low back pain and right leg pain of insidious onset beginning about 3 weeks ago. Began in her back and spread to her Rt LE with reported numbness. Difficulty obtaining further description of pain due to language barrier. TTP at Rt SIJ and at L5 with tightness along Rt LE. Pt denies regular exercise and tried walking after MD  visit but reported that it increased her pain so she stopped. Provided with stretches and self STM at eval and asked her to do them BID. Pt will benefit from skilled PT in order to decrease pain and reach functional goals.    Personal Factors and Comorbidities  Fitness;Comorbidity 1    Comorbidities  h/o back pain, obesity    Examination-Activity Limitations  Locomotion Level;Bed Mobility;Bend;Sit;Sleep;Squat;Stairs;Stand    Examination-Participation Restrictions  Meal Prep;Cleaning;Community Activity;Dorita Sciara    Stability/Clinical Decision Making  Evolving/Moderate complexity    Clinical Decision Making  Moderate    Rehab Potential  Good    PT Frequency  --   3 visits in first authorization   PT Treatment/Interventions  ADLs/Self Care Home Management;Cryotherapy;Electrical Stimulation;Ultrasound;Traction;Moist Heat;Iontophoresis 4mg /ml Dexamethasone;Functional mobility training;Therapeutic activities;Therapeutic exercise;Patient/family education;Neuromuscular re-education;Manual techniques;Passive range of motion;Dry needling;Spinal Manipulations;Taping    PT Next Visit Plan  consider DN/STM, core strength    PT Home Exercise Plan  piriformis stretch, LTR, self STM with tennis ball    Consulted and Agree with Plan of Care  Patient       Patient will benefit from skilled therapeutic intervention in order to improve the following deficits and impairments:  Decreased range of motion, Difficulty walking, Decreased activity tolerance, Pain, Impaired flexibility, Improper body mechanics, Decreased strength, Impaired sensation, Postural dysfunction  Visit Diagnosis: Acute right-sided low back pain, unspecified whether sciatica present - Plan: PT plan of care cert/re-cert  Pain in right leg - Plan: PT plan of care cert/re-cert     Problem List Patient Active Problem List   Diagnosis Date Noted  . Radiculopathy of lumbosacral region 03/21/2019  . Right leg pain 03/21/2019  . Impaired fasting blood sugar 02/23/2019  . Cold sore 10/18/2018  . Chest pain 09/23/2018  . Neuropathy involving both lower extremities 09/23/2018  . Screen for colon cancer 05/17/2018  . Vaccine counseling 05/17/2018  . Fatty liver 05/17/2018  . Screening for cervical cancer 05/17/2018  . Hypothyroidism 05/17/2018  . Need for pneumococcal vaccination 05/17/2018  . Generalized abdominal tenderness 05/17/2018  . Need for influenza vaccination 04/13/2018  . Other fatigue 02/08/2018  . Edema 01/25/2018  .  Renal failure (ARF), acute on chronic (HCC) 01/20/2018  . Normocytic normochromic anemia 01/20/2018  . Tremor 01/05/2018  . Vitamin D deficiency 01/05/2018  . RUQ abdominal pain 10/27/2017  . Hemorrhoids 10/27/2017  . High risk medication use 09/25/2017  . Type 2 diabetes mellitus with neurological complications ([place]) [id number]  . Proteinuria 09/02/2017  . IgA nephropathy 06/10/2017  . Language barrier 06/10/2017  . Gastroesophageal reflux disease 06/10/2017  . SOB (shortness of breath) 02/24/2017  . Hyperlipidemia 04/21/2016  . Lumbar back pain 01/29/2016  . Morbid obesity ([place]) 01/29/2016  . Hypokalemia 01/29/2016  . Essential hypertension 01/29/2016  Carrson Lightcap C. Mike Hamre PT, DPT 03/24/19 2:26 PM   [facility] [address] [place], [name], [address] Phone: [phone]   Fax:  [phone]  Name: STACHA SCHRANZ MRN: [id number] Date of Birth: Sep 23, 1970

## 2019-04-02 ENCOUNTER — Ambulatory Visit (HOSPITAL_COMMUNITY): Payer: Medicaid Other

## 2019-04-04 ENCOUNTER — Other Ambulatory Visit: Payer: Self-pay | Admitting: Medical

## 2019-04-05 ENCOUNTER — Ambulatory Visit (HOSPITAL_COMMUNITY)
Admission: RE | Admit: 2019-04-05 | Discharge: 2019-04-05 | Disposition: A | Discharge status: Home / Self Care | Payer: Medicaid Other | Source: Ambulatory Visit | Attending: Medical | Admitting: Medical

## 2019-04-05 ENCOUNTER — Other Ambulatory Visit: Payer: Self-pay

## 2019-04-05 DIAGNOSIS — R1011 Right upper quadrant pain: Secondary | ICD-10-CM

## 2019-04-05 MED ORDER — TECHNETIUM TC 99M MEBROFENIN IV KIT
5.0000 | PACK | Freq: Once | INTRAVENOUS | Status: AC | PRN
  Administered 2019-04-05: 13:00:00 5 via INTRAVENOUS

## 2019-04-06 ENCOUNTER — Other Ambulatory Visit: Payer: Self-pay | Admitting: Medical

## 2019-04-06 ENCOUNTER — Ambulatory Visit: Payer: Medicaid Other | Attending: Medical | Admitting: Physical Therapy

## 2019-04-06 ENCOUNTER — Encounter: Payer: Self-pay | Admitting: Physical Therapy

## 2019-04-06 DIAGNOSIS — M79604 Pain in right leg: Secondary | ICD-10-CM | POA: Insufficient documentation

## 2019-04-06 DIAGNOSIS — Z789 Other specified health status: Secondary | ICD-10-CM

## 2019-04-06 DIAGNOSIS — E039 Hypothyroidism, unspecified: Secondary | ICD-10-CM

## 2019-04-06 DIAGNOSIS — M545 Low back pain, unspecified: Secondary | ICD-10-CM

## 2019-04-06 DIAGNOSIS — E1149 Type 2 diabetes mellitus with other diabetic neurological complication: Secondary | ICD-10-CM

## 2019-04-06 DIAGNOSIS — N028 Recurrent and persistent hematuria with other morphologic changes: Secondary | ICD-10-CM

## 2019-04-06 DIAGNOSIS — R1011 Right upper quadrant pain: Secondary | ICD-10-CM

## 2019-04-06 NOTE — Therapy (Signed)
[place], [name], [address] Phone: [phone]   Fax:  [phone]  Physical Therapy Treatment  Patient Details  Name: Linda Black MRN: [id number] Date of Birth: 1970/08/07 Referring Provider (PT): Glade Lloyd Camelia Eng, PA-C   Encounter Date: 04/06/2019  PT End of Session - 04/06/19 0926    Visit Number  2    Authorization Type  MCD    Authorization Time Period  9/1-9/21    Authorization - Visit Number  1    Authorization - Number of Visits  3    PT Start Time  0845    PT Stop Time  0926    PT Time Calculation (min)  41 min    Activity Tolerance  Patient tolerated treatment well    Behavior During Therapy  [facility] for tasks assessed/performed       Past Medical History:  Diagnosis Date  . Diabetes mellitus without complication ([place])   . Fatty liver 2019   elevated LFTs, negative for Hep B and Hep C 04/2018  . Hypertension   . Hypothyroidism 2019  . IgA nephropathy 2018   Dr. Corliss Parish  . Language barrier   . Obesity   . Proteinuria     Past Surgical History:  Procedure Laterality Date  . RENAL BIOPSY  2018   IgA nephropathy, Dr. Corliss Parish    There were no vitals filed for this visit.  Subjective Assessment - 04/06/19 0848    Subjective  There seems to be no change with doing HEP BID. The whole leg hurts. Hydrocodone does not help pain. Ice pack on knee is helpful. Walking is very painful. The knee hurts first and then the back begins to hurt.    Patient Stated Goals  decrease pain, standing/walking                       [facility] Adult PT Treatment/Exercise - 04/06/19 0001      Exercises   Exercises  Ankle      Lumbar Exercises: Stretches   Passive Hamstring Stretch Limitations  supine with strap    Gastroc Stretch  Right;Left;30 seconds      Lumbar Exercises: Supine   Bridge Limitations  dorsiflexion in feet      Lumbar Exercises: Sidelying   Clam   Both;20 reps      Ankle Exercises: Seated   Towel Inversion/Eversion Limitations  inversion/eversion with red tband resistance             PT Education - 04/06/19 0930    Education Details  shoes, anatomy of condition- incr time for interpretation    Person(s) Educated  Patient    Methods  Explanation;Handout;Demonstration;Tactile cues;Verbal cues    Comprehension  Verbalized understanding;Need further instruction;Returned demonstration;Verbal cues required;Tactile cues required       PT Short Term Goals - 03/24/19 1319      PT SHORT TERM GOAL #1   Title  Pt will be able to perofrm light household chores    Baseline  unable to complete any at eval due to pain    Time  3    Period  Weeks    Status  New    Target Date  04/15/19      PT SHORT TERM GOAL #2   Title  Pt will demo proper form with short term HEP as it has been established    Baseline  began creating at eval  Time  3    Period  Weeks    Status  New    Target Date  04/15/19      PT SHORT TERM GOAL #3   Title  Pt will be able to complete short walks for exercise    Baseline  unable due to pain at eval    Time  3    Period  Weeks    Status  New    Target Date  04/15/19        PT Long Term Goals - 03/24/19 1322      PT LONG TERM GOAL #1   Title  to be set at [place] - 04/06/19 0927    Clinical Impression Statement  Concordant pain in Rt gastroc trigger point reduced with manual therapy and discussed that she should feel sore/achey rather than the pain she came in. Added stretches and stabilization for bip abductors and ankles. Notable ankle instability in standing. Pt reports she is wearing compression stockings due to kidney issues but we discussed wearing supportive shoes as well as stockings.    PT Treatment/Interventions  ADLs/Self Care Home Management;Cryotherapy;Electrical Stimulation;Ultrasound;Traction;Moist Heat;Iontophoresis 4mg /ml Dexamethasone;Functional mobility  training;Therapeutic activities;Therapeutic exercise;Patient/family education;Neuromuscular re-education;Manual techniques;Passive range of motion;Dry needling;Spinal Manipulations;Taping    PT Next Visit Plan  consider DN to gastroc, ankle stability    PT Home Exercise Plan  piriformis stretch, LTR, self STM with tennis ball, gastroc stretch, clam, supine HSS, bridge, ankle eversion/inversion red tband    Consulted and Agree with Plan of Care  Patient       Patient will benefit from skilled therapeutic intervention in order to improve the following deficits and impairments:  Decreased range of motion, Difficulty walking, Decreased activity tolerance, Pain, Impaired flexibility, Improper body mechanics, Decreased strength, Impaired sensation, Postural dysfunction  Visit Diagnosis: Acute right-sided low back pain, unspecified whether sciatica present  Pain in right leg     Problem List Patient Active Problem List   Diagnosis Date Noted  . Radiculopathy of lumbosacral region 03/21/2019  . Right leg pain 03/21/2019  . Impaired fasting blood sugar 02/23/2019  . Cold sore 10/18/2018  . Chest pain 09/23/2018  . Neuropathy involving both lower extremities 09/23/2018  . Screen for colon cancer 05/17/2018  . Vaccine counseling 05/17/2018  . Fatty liver 05/17/2018  . Screening for cervical cancer 05/17/2018  . Hypothyroidism 05/17/2018  . Need for pneumococcal vaccination 05/17/2018  . Generalized abdominal tenderness 05/17/2018  . Need for influenza vaccination 04/13/2018  . Other fatigue 02/08/2018  . Edema 01/25/2018  . Renal failure (ARF), acute on chronic (HCC) 01/20/2018  . Normocytic normochromic anemia 01/20/2018  . Tremor 01/05/2018  . Vitamin D deficiency 01/05/2018  . RUQ abdominal pain 10/27/2017  . Hemorrhoids 10/27/2017  . High risk medication use 09/25/2017  . Type 2 diabetes mellitus with neurological complications ([place]) [id number]  . Proteinuria 09/02/2017  . IgA  nephropathy 06/10/2017  . Language barrier 06/10/2017  . Gastroesophageal reflux disease 06/10/2017  . SOB (shortness of breath) 02/24/2017  . Hyperlipidemia 04/21/2016  . Lumbar back pain 01/29/2016  . Morbid obesity ([place]) 01/29/2016  . Hypokalemia 01/29/2016  . Essential hypertension 01/29/2016    Keneth Borg C. Cantrell Larouche PT, DPT 04/06/19 9:31 AM   [facility] [address] [place], [name], [address] Phone: [phone]   Fax:  [phone]  Name: Linda Black MRN: [id number] Date of Birth: 03/05/1971

## 2019-04-08 ENCOUNTER — Ambulatory Visit: Payer: Medicaid Other | Admitting: Physical Therapy

## 2019-04-08 ENCOUNTER — Other Ambulatory Visit: Payer: Self-pay

## 2019-04-08 ENCOUNTER — Encounter: Payer: Self-pay | Admitting: Physical Therapy

## 2019-04-08 DIAGNOSIS — M545 Low back pain, unspecified: Secondary | ICD-10-CM

## 2019-04-08 DIAGNOSIS — M79604 Pain in right leg: Secondary | ICD-10-CM

## 2019-04-08 NOTE — Therapy (Signed)
[place], [name], [address] Phone: [phone]   Fax:  [phone]  Physical Therapy Treatment  Patient Details  Name: Linda Black MRN: [id number] Date of Birth: 1970-10-04 Referring Provider (PT): Glade Lloyd Camelia Eng, PA-C   Encounter Date: 04/08/2019  PT End of Session - 04/08/19 1018    Visit Number  3    Authorization Type  MCD    Authorization - Visit Number  2    Authorization - Number of Visits  3    PT Start Time  [id number]    PT Stop Time  1053    PT Time Calculation (min)  38 min    Activity Tolerance  Patient tolerated treatment well    Behavior During Therapy  [facility] for tasks assessed/performed       Past Medical History:  Diagnosis Date  . Diabetes mellitus without complication ([place])   . Fatty liver 2019   elevated LFTs, negative for Hep B and Hep C 04/2018  . Hypertension   . Hypothyroidism 2019  . IgA nephropathy 2018   Dr. Corliss Parish  . Language barrier   . Obesity   . Proteinuria     Past Surgical History:  Procedure Laterality Date  . RENAL BIOPSY  2018   IgA nephropathy, Dr. Corliss Parish    There were no vitals filed for this visit.  Subjective Assessment - 04/08/19 1017    Subjective  It is getting a little bit better but it still hurts (back). The back of my knee still hurts. When I have to do a lot of moving around it gets to about a 5 or 6.    Patient Stated Goals  decrease pain, standing/walking    Currently in Pain?  Yes    Pain Location  Back                       [facility] Adult PT Treatment/Exercise - 04/08/19 0001      Lumbar Exercises: Stretches   Passive Hamstring Stretch Limitations  seated edge of bed      Lumbar Exercises: Supine   Bridge  15 reps   3 sets   Bridge Limitations  in dorsiflexion    Straight Leg Raise  10 reps    Other Supine Lumbar Exercises  x10 SLR with ER      Lumbar Exercises: Sidelying   Hip Abduction   15 reps;Both      Lumbar Exercises: Prone   Straight Leg Raise  15 reps    Other Prone Lumbar Exercises  hamstring curls      Manual Therapy   Manual Therapy  Soft tissue mobilization    Soft tissue mobilization  Rt gastroc/soleus      Ankle Exercises: Seated   Towel Inversion/Eversion Limitations  guided eversion AROM      Ankle Exercises: Standing   Heel Raises  Both;20 reps   2 sets     Ankle Exercises: Stretches   Gastroc Stretch  2 reps;10 seconds               PT Short Term Goals - 03/24/19 1319      PT SHORT TERM GOAL #1   Title  Pt will be able to perofrm light household chores    Baseline  unable to complete any at eval due to pain    Time  3    Period  Weeks    Status  New  Target Date  04/15/19      PT SHORT TERM GOAL #2   Title  Pt will demo proper form with short term HEP as it has been established    Baseline  began creating at eval    Time  3    Period  Weeks    Status  New    Target Date  04/15/19      PT SHORT TERM GOAL #3   Title  Pt will be able to complete short walks for exercise    Baseline  unable due to pain at eval    Time  3    Period  Weeks    Status  New    Target Date  04/15/19        PT Long Term Goals - 03/24/19 1322      PT LONG TERM GOAL #1   Title  to be set at [place] - 04/08/19 1054    Clinical Impression Statement  Heavy verbal and tactile cues required from PT and interpreter today. Pt has difficulty distinguishing different types of pain and reported that stretches and exercises were "pain" in different ares. Did not see visual signs of pain but pt was fatigued and sweating and was able to ambulate without antalgic gait following treatment. Reports compliance with HEP, will perform ERO at next visit to request further visits from MCD.    PT Treatment/Interventions  ADLs/Self Care Home Management;Cryotherapy;Electrical Stimulation;Ultrasound;Traction;Moist Heat;Iontophoresis 4mg /ml  Dexamethasone;Functional mobility training;Therapeutic activities;Therapeutic exercise;Patient/family education;Neuromuscular re-education;Manual techniques;Passive range of motion;Dry needling;Spinal Manipulations;Taping    PT Next Visit Plan  MCD ERO    PT Home Exercise Plan  piriformis stretch, LTR, self STM with tennis ball, gastroc stretch, clam, supine HSS, bridge, ankle eversion/inversion red tband, SLR neutral & ER, SL abd, prone HS curl & hip ext    Consulted and Agree with Plan of Care  Patient       Patient will benefit from skilled therapeutic intervention in order to improve the following deficits and impairments:  Decreased range of motion, Difficulty walking, Decreased activity tolerance, Pain, Impaired flexibility, Improper body mechanics, Decreased strength, Impaired sensation, Postural dysfunction  Visit Diagnosis: Acute right-sided low back pain, unspecified whether sciatica present  Pain in right leg     Problem List Patient Active Problem List   Diagnosis Date Noted  . Radiculopathy of lumbosacral region 03/21/2019  . Right leg pain 03/21/2019  . Impaired fasting blood sugar 02/23/2019  . Cold sore 10/18/2018  . Chest pain 09/23/2018  . Neuropathy involving both lower extremities 09/23/2018  . Screen for colon cancer 05/17/2018  . Vaccine counseling 05/17/2018  . Fatty liver 05/17/2018  . Screening for cervical cancer 05/17/2018  . Hypothyroidism 05/17/2018  . Need for pneumococcal vaccination 05/17/2018  . Generalized abdominal tenderness 05/17/2018  . Need for influenza vaccination 04/13/2018  . Other fatigue 02/08/2018  . Edema 01/25/2018  . Renal failure (ARF), acute on chronic (HCC) 01/20/2018  . Normocytic normochromic anemia 01/20/2018  . Tremor 01/05/2018  . Vitamin D deficiency 01/05/2018  . RUQ abdominal pain 10/27/2017  . Hemorrhoids 10/27/2017  . High risk medication use 09/25/2017  . Type 2 diabetes mellitus with neurological complications  ([place]) [id number]  . Proteinuria 09/02/2017  . IgA nephropathy 06/10/2017  . Language barrier 06/10/2017  . Gastroesophageal reflux disease 06/10/2017  . SOB (shortness of breath) 02/24/2017  . Hyperlipidemia 04/21/2016  . Lumbar back pain 01/29/2016  .  Morbid obesity ([place]) 01/29/2016  . Hypokalemia 01/29/2016  . Essential hypertension 01/29/2016    Kali Deadwyler C. Christpher Stogsdill PT, DPT 04/08/19 10:58 AM   [facility] [address] [place], [name], [address] Phone: [phone]   Fax:  [phone]  Name: Linda Black MRN: [id number] Date of Birth: [birth year]

## 2019-04-13 ENCOUNTER — Encounter: Payer: Self-pay | Admitting: Physical Therapy

## 2019-04-13 ENCOUNTER — Ambulatory Visit: Payer: Medicaid Other | Admitting: Physical Therapy

## 2019-04-13 ENCOUNTER — Other Ambulatory Visit: Payer: Self-pay

## 2019-04-13 DIAGNOSIS — M545 Low back pain, unspecified: Secondary | ICD-10-CM

## 2019-04-13 DIAGNOSIS — M79604 Pain in right leg: Secondary | ICD-10-CM

## 2019-04-13 NOTE — Therapy (Signed)
[place], [name], [address] Phone: [phone]   Fax:  [phone]  Physical Therapy Treatment  Patient Details  Name: Linda Black MRN: [id number] Date of Birth: 12-29-1970 Referring Provider (PT): Glade Lloyd Camelia Eng, PA-C   Encounter Date: 04/13/2019  PT End of Session - 04/13/19 1041    Visit Number  4    Authorization Type  MCD    Authorization Time Period  9/1-9/21    Authorization - Visit Number  3    Authorization - Number of Visits  3    PT Start Time  8003    PT Stop Time  1041    PT Time Calculation (min)  26 min    Activity Tolerance  Patient tolerated treatment well    Behavior During Therapy  [facility] for tasks assessed/performed       Past Medical History:  Diagnosis Date  . Diabetes mellitus without complication ([place])   . Fatty liver 2019   elevated LFTs, negative for Hep B and Hep C 04/2018  . Hypertension   . Hypothyroidism 2019  . IgA nephropathy 2018   Dr. Corliss Parish  . Language barrier   . Obesity   . Proteinuria     Past Surgical History:  Procedure Laterality Date  . RENAL BIOPSY  2018   IgA nephropathy, Dr. Corliss Parish    There were no vitals filed for this visit.  Subjective Assessment - 04/13/19 1018    Subjective  The more I exercise, the worse my knee hurts. I think I was overworking myself. Overall I do not think PT has been helpful. My knees become very swollen and it travels up to my back. Tension feeling below knee with some tingling which is helped a little with compression socks.    How long can you stand comfortably?  30 min and back will start hurting and move to knees- knees will swell following.    Patient Stated Goals  decrease pain, standing/walking    Currently in Pain?  Yes    Pain Location  Back    Pain Orientation  Lower    Pain Descriptors / Indicators  --   "pain"   Aggravating Factors   standing, walking    Pain Relieving Factors   unknown         [facility] PT Assessment - 04/13/19 0001      Strength   Right Hip Flexion  5/5    Right Hip Extension  4/5    Right Hip ABduction  5/5    Left Hip Flexion  4+/5    Left Hip Extension  4/5    Left Hip ABduction  5/5      Palpation   Palpation comment  reports tenderness in Rt SIJ and L1-LB, soreness in Rt piriformis      Special Tests   Other special tests  HS stretch with passive SLR                           PT Education - 04/13/19 1051    Education Details  goals, strength, discussion of her pain- incr time for clarity through interpretation    Person(s) Educated  Patient    Methods  Explanation    Comprehension  Verbalized understanding       PT Short Term Goals - 04/13/19 1024      PT SHORT TERM GOAL #1   Title  Pt  will be able to perofrm light household chores    Baseline  still not able to do chores    Status  Not Met      PT SHORT TERM GOAL #2   Title  Pt will demo proper form with short term HEP as it has been established    Status  Achieved      PT SHORT TERM GOAL #3   Title  Pt will be able to complete short walks for exercise    Baseline  just walking neighborhood or house, but is painful    Status  Partially Met        PT Long Term Goals - 03/24/19 1322      PT LONG TERM GOAL #1   Title  to be set at [place] - 04/13/19 1045    Clinical Impression Statement  At this time, pt has made improvements in strength but continues to report the same levels of pain between low back and knee. Reports swelling causing knee to feel like it is moving too much- pt does have h/o edema in Rt LE secondary to kidney issues and we discussed possible need for thigh-high compression stockings to combat this as a contributing factor. Due to lack of change with pain limiting functional ability, pt will return to referring provider to discuss further testing while performing HEP to continue strengthening.    PT  Treatment/Interventions  ADLs/Self Care Home Management;Cryotherapy;Electrical Stimulation;Ultrasound;Traction;Moist Heat;Iontophoresis '4mg'$ /ml Dexamethasone;Functional mobility training;Therapeutic activities;Therapeutic exercise;Patient/family education;Neuromuscular re-education;Manual techniques;Passive range of motion;Dry needling;Spinal Manipulations;Taping    PT Home Exercise Plan  piriformis stretch, LTR, self STM with tennis ball, gastroc stretch, clam, supine HSS, bridge, ankle eversion/inversion red tband, SLR neutral & ER, SL abd, prone HS curl & hip ext    Consulted and Agree with Plan of Care  Patient       Patient will benefit from skilled therapeutic intervention in order to improve the following deficits and impairments:  Decreased range of motion, Difficulty walking, Decreased activity tolerance, Pain, Impaired flexibility, Improper body mechanics, Decreased strength, Impaired sensation, Postural dysfunction  Visit Diagnosis: Acute right-sided low back pain, unspecified whether sciatica present  Pain in right leg     Problem List Patient Active Problem List   Diagnosis Date Noted  . Radiculopathy of lumbosacral region 03/21/2019  . Right leg pain 03/21/2019  . Impaired fasting blood sugar 02/23/2019  . Cold sore 10/18/2018  . Chest pain 09/23/2018  . Neuropathy involving both lower extremities 09/23/2018  . Screen for colon cancer 05/17/2018  . Vaccine counseling 05/17/2018  . Fatty liver 05/17/2018  . Screening for cervical cancer 05/17/2018  . Hypothyroidism 05/17/2018  . Need for pneumococcal vaccination 05/17/2018  . Generalized abdominal tenderness 05/17/2018  . Need for influenza vaccination 04/13/2018  . Other fatigue 02/08/2018  . Edema 01/25/2018  . Renal failure (ARF), acute on chronic (HCC) 01/20/2018  . Normocytic normochromic anemia 01/20/2018  . Tremor 01/05/2018  . Vitamin D deficiency 01/05/2018  . RUQ abdominal pain 10/27/2017  . Hemorrhoids  10/27/2017  . High risk medication use 09/25/2017  . Type 2 diabetes mellitus with neurological complications ([place]) 40/98/1191  . Proteinuria 09/02/2017  . IgA nephropathy 06/10/2017  . Language barrier 06/10/2017  . Gastroesophageal reflux disease 06/10/2017  . SOB (shortness of breath) 02/24/2017  . Hyperlipidemia 04/21/2016  . Lumbar back pain 01/29/2016  . Morbid obesity ([place]) 01/29/2016  . Hypokalemia 01/29/2016  .  Essential hypertension 01/29/2016   Kiandre Spagnolo C. Corbyn Wildey PT, DPT 04/13/19 10:52 AM   [facility] [address] [place], [name], [address] Phone: [phone]   Fax:  [phone]  Name: Linda Black MRN: [id number] Date of Birth: 04/17/1971

## 2019-04-14 ENCOUNTER — Encounter: Payer: Self-pay | Admitting: Nurse Practitioner

## 2019-04-18 ENCOUNTER — Ambulatory Visit (INDEPENDENT_AMBULATORY_CARE_PROVIDER_SITE_OTHER): Payer: Medicaid Other | Admitting: Medical

## 2019-04-18 ENCOUNTER — Other Ambulatory Visit: Payer: Self-pay

## 2019-04-18 ENCOUNTER — Encounter: Payer: Self-pay | Admitting: Medical

## 2019-04-18 VITALS — BP 130/80 | HR 73 | Temp 98.0°F | Wt 192.8 lb

## 2019-04-18 DIAGNOSIS — R202 Paresthesia of skin: Secondary | ICD-10-CM

## 2019-04-18 DIAGNOSIS — M5136 Other intervertebral disc degeneration, lumbar region: Secondary | ICD-10-CM

## 2019-04-18 DIAGNOSIS — M5441 Lumbago with sciatica, right side: Secondary | ICD-10-CM

## 2019-04-18 DIAGNOSIS — Z23 Encounter for immunization: Secondary | ICD-10-CM | POA: Diagnosis not present

## 2019-04-18 DIAGNOSIS — M5410 Radiculopathy, site unspecified: Secondary | ICD-10-CM | POA: Diagnosis not present

## 2019-04-18 DIAGNOSIS — G8929 Other chronic pain: Secondary | ICD-10-CM

## 2019-04-18 MED ORDER — TRAMADOL HCL 50 MG PO TABS: 50.0000 mg | ORAL_TABLET | Freq: Three times a day (TID) | ORAL | 0 refills | Status: DC | PRN

## 2019-04-18 NOTE — Progress Notes (Signed)
Subjective: Chief Complaint  Patient presents with  . Back Pain    rigth lower radiating down right leg x1 month    Here with daughter who interprets.  She reports ongoing problems with right leg pain and low back pain for months now.  She has been doing physical therapy but that does not seem to be helping.  This morning she could barely bend over.  She notes ongoing pain down the right leg and the right low back, burning hot feeling in the leg, numbness and tingling at times in the right leg.  No recent injury trauma or fall.  No fever, no blood in the stool or urine, no saddle anesthesia, no weight loss.  No other aggravating or relieving factors. No other complaint.  Past Medical History:  Diagnosis Date  . Diabetes mellitus without complication ([place])   . Fatty liver 2019   elevated LFTs, negative for Hep B and Hep C 04/2018  . Hypertension   . Hypothyroidism 2019  . IgA nephropathy 2018   Dr. Corliss Parish  . Language barrier   . Obesity   . Proteinuria    Current Outpatient Medications on File Prior to Visit  Medication Sig Dispense Refill  . ACCU-CHEK AVIVA PLUS test strip TEST TWICE A DAY 100 strip 3  . amLODipine (NORVASC) 10 MG tablet Take 1 tablet (10 mg total) by mouth daily. 90 tablet 3  . EPINEPHrine 0.3 mg/0.3 mL IJ SOAJ injection Only use during anaphylaxis 1 Device 0  . furosemide (LASIX) 40 MG tablet TAKE 2 TABLETS BY MOUTH EVERY MORNING THEN 1 TABLET EVERY EVENING 270 tablet 0  . levothyroxine (SYNTHROID, LEVOTHROID) 25 MCG tablet Take 1 tablet (25 mcg total) by mouth daily before breakfast. 30 tablet 11  . losartan (COZAAR) 50 MG tablet Take 1 tablet (50 mg total) by mouth daily. 30 tablet 0  . metoprolol succinate (TOPROL-XL) 25 MG 24 hr tablet TAKE 1 TABLET(25 MG) BY MOUTH DAILY. DISCARD VALSARTAN ORDER 90 tablet 1  . omeprazole (PRILOSEC) 40 MG capsule Take 1 capsule (40 mg total) by mouth daily. 90 capsule 3  . ondansetron (ZOFRAN) 4 MG tablet Take 1  tablet (4 mg total) by mouth every 8 (eight) hours as needed for nausea or vomiting. 20 tablet 0  . potassium chloride SA (K-DUR) 20 MEQ tablet TAKE 1 TABLET(20 MEQ) BY MOUTH TWICE DAILY 60 tablet 3  . promethazine (PHENERGAN) 25 MG tablet TAKE 1/2 TO 1 TABLET EVERY 6 HOURS AS NEEDED FOR NAUSEA 20 tablet 0  . rosuvastatin (CRESTOR) 40 MG tablet Take 1 tablet (40 mg total) by mouth daily. 90 tablet 3  . cholecalciferol (VITAMIN D) 1000 units tablet Take 2 tablets (2,000 Units total) by mouth daily. (Patient not taking: Reported on 03/21/2019) 180 tablet 3  . diphenhydrAMINE (BENADRYL) 25 mg capsule Take 1 capsule (25 mg total) by mouth every 12 (twelve) hours for 5 days. 10 capsule 0  . gabapentin (NEURONTIN) 100 MG capsule Take 1 capsule (100 mg total) by mouth at bedtime. (Patient not taking: Reported on 02/23/2019) 30 capsule 11  . HYDROcodone-acetaminophen (NORCO) 5-325 MG tablet Take 1 tablet by mouth every 6 (six) hours as needed for moderate pain. (Patient not taking: Reported on 04/18/2019) 20 tablet 0  . hydrocortisone (ANUSOL-HC) 2.5 % rectal cream Place 1 application rectally 2 (two) times daily. (Patient not taking: Reported on 07/05/2018) 30 g 0  . Insulin Pen Needle 32G X 4 MM MISC 1 each by Does not  apply route at bedtime. (Patient not taking: Reported on 04/18/2019) 100 each 11   No current facility-administered medications on file prior to visit.    ROS as in subjective   Objective: BP 130/80   Pulse 73   Temp 98 F (36.7 C)   Wt 192 lb 12.8 oz (87.5 kg)   LMP 03/23/2017   SpO2 98%   BMI 34.15 kg/m   General well-developed well-nourished no acute distress, obese [name] Tender right lumbar spine, very limited range of motion today due to pain, no scoliosis or deformity Legs non tender normal range of motion, no hip pain, there is general 1+ nonpitting edema lower legs but this is chronic Neuro: Unable to do heel and toe walk, negative straight leg raise, but appears  to be normal strength of bilateral lower extremities, normal sensation, DTRs decreased 1+ bilaterally   Assessment: Encounter Diagnoses  Name Primary?  . Chronic right-sided low back pain with right-sided sciatica Yes  . Radicular pain of right lower extremity   . Paresthesia   . Other intervertebral disc degeneration, lumbar region   . Need for influenza vaccination      Plan: She continues to have low back pain and radicular pain to the right leg.  She has been going to physical therapy but not improving at all.  We will go ahead and set up for MRI of lumbar spine.  I reviewed her 2016 lumbar spine x-ray.  Can use pain medicine below as needed short-term  Counseled on the influenza virus vaccine.  Vaccine information sheet given.  Influenza vaccine given after consent obtained.   Linda Black was seen today for back pain.  Diagnoses and all orders for this visit:  Chronic right-sided low back pain with right-sided sciatica  Radicular pain of right lower extremity  Paresthesia  Other intervertebral disc degeneration, lumbar region -     MR Lumbar Spine Wo Contrast; Future  Need for influenza vaccination  Other orders -     traMADol (ULTRAM) 50 MG tablet; Take 1 tablet (50 mg total) by mouth every 8 (eight) hours as needed for up to 5 days. -     Flu Vaccine QUAD 6+ mos PF IM (Fluarix Quad PF)

## 2019-04-19 ENCOUNTER — Telehealth: Payer: Self-pay | Admitting: Internal Medicine

## 2019-04-19 NOTE — Telephone Encounter (Signed)
MRI Lumbar Spine as been denied by [facility] but I have faxed over clinical info for review to see if they will approve this. FYI

## 2019-04-28 ENCOUNTER — Ambulatory Visit: Payer: Medicaid Other | Admitting: Nurse Practitioner

## 2019-05-11 ENCOUNTER — Other Ambulatory Visit: Payer: Self-pay | Admitting: Medical

## 2019-05-12 ENCOUNTER — Ambulatory Visit: Payer: Medicaid Other | Admitting: Physician Assistant

## 2019-05-12 ENCOUNTER — Encounter: Payer: Self-pay | Admitting: Physician Assistant

## 2019-05-12 VITALS — BP 128/90 | HR 80 | Temp 98.3°F | Ht 61.0 in | Wt 189.0 lb

## 2019-05-12 DIAGNOSIS — R1013 Epigastric pain: Secondary | ICD-10-CM | POA: Diagnosis not present

## 2019-05-12 DIAGNOSIS — K219 Gastro-esophageal reflux disease without esophagitis: Secondary | ICD-10-CM

## 2019-05-12 MED ORDER — PANTOPRAZOLE SODIUM 40 MG PO TBEC: 40.0000 mg | DELAYED_RELEASE_TABLET | Freq: Two times a day (BID) | ORAL | 3 refills | Status: DC

## 2019-05-12 NOTE — Progress Notes (Signed)
Please order h.pylori serology IGG per Dr. Havery Moros. Thanks-JLL

## 2019-05-12 NOTE — Patient Instructions (Addendum)
If you are age 47 or older, your body mass index should be between 23-30. Your Body mass index is 35.71 kg/m. If this is out of the aforementioned range listed, please consider follow up with your Primary Care Provider.  If you are age 2 or younger, your body mass index should be between 19-25. Your Body mass index is 35.71 kg/m. If this is out of the aformentioned range listed, please consider follow up with your Primary Care Provider.   We have sent the following medications to your pharmacy for you to pick up at your convenience: Pantoprazole 40 mg twice daily.  Follow up with me in 4-6 weeks.  The schedule is not available at this time.  Please call the office in a couple of weeks to schedule appointment.  Thank you for choosing me and [facility].    Ellouise Newer, PA-C

## 2019-05-12 NOTE — Progress Notes (Signed)
Chief Complaint: Abdominal pain  HPI:    Linda Black is a 48 year old female, who presents to clinic accompanied by interpreter and her daughter, who was referred to me by Carlena Hurl, PA-C for a complaint of abdominal pain.     09/28/2018 ultrasound abdomen complete showed hepatic steatosis, normal sonographic appearance of the gallbladder with no evidence for cholelithiasis, acute cholecystitis or biliary dilatation, increased echogenicity within the renal parenchyma bilaterally, compatible with medical renal disease.  No hydronephrosis.    04/05/2019 HIDA scan was negative/normal.      Today, the patient presents to clinic and explains that she started with a right upper quadrant/epigastric pain which is "sharp" and feels like "burning", this is constant throughout the day but can radiate in intensity.  At its worst is a 6/10.  Tells me this starts 5 minutes after eating just about anything.  Admits to pretty healthy diet with steamed vegetables and baked meats.  Also hurts some when she is being "active".  Associated with nausea.  Does not disturb her sleep.  Has tried some Tums which does not help.  She has been gaining weight.  Also with reflux symptoms on a daily basis.    Denies fever, chills, weight loss, anorexia or vomiting.  Past Medical History:  Diagnosis Date  . Diabetes mellitus without complication ([place])   . Fatty liver 2019   elevated LFTs, negative for Hep B and Hep C 04/2018  . Hypertension   . Hypothyroidism 2019  . IgA nephropathy 2018   Dr. Corliss Parish  . Language barrier   . Obesity   . Proteinuria     Past Surgical History:  Procedure Laterality Date  . RENAL BIOPSY  2018   IgA nephropathy, Dr. Corliss Parish    Current Outpatient Medications on File Prior to Visit  Medication Sig Dispense Refill  . amLODipine (NORVASC) 10 MG tablet Take 1 tablet (10 mg total) by mouth daily. 90 tablet 3  . diphenhydrAMINE (BENADRYL) 25 mg capsule Take 1  capsule (25 mg total) by mouth every 12 (twelve) hours for 5 days. 10 capsule 0  . furosemide (LASIX) 40 MG tablet TAKE 2 TABLETS BY MOUTH EVERY MORNING THEN 1 TABLET EVERY EVENING 270 tablet 0  . losartan (COZAAR) 50 MG tablet Take 1 tablet (50 mg total) by mouth daily. 30 tablet 0  . metoprolol succinate (TOPROL-XL) 25 MG 24 hr tablet TAKE 1 TABLET(25 MG) BY MOUTH DAILY. DISCARD VALSARTAN ORDER 90 tablet 1  . potassium chloride SA (K-DUR) 20 MEQ tablet TAKE 1 TABLET(20 MEQ) BY MOUTH TWICE DAILY 60 tablet 3  . rosuvastatin (CRESTOR) 40 MG tablet Take 1 tablet (40 mg total) by mouth daily. 90 tablet 3  . ACCU-CHEK AVIVA PLUS test strip TEST TWICE A DAY 100 strip 3  . EPINEPHrine 0.3 mg/0.3 mL IJ SOAJ injection Only use during anaphylaxis 1 Device 0  . Insulin Pen Needle 32G X 4 MM MISC 1 each by Does not apply route at bedtime. (Patient not taking: Reported on 04/18/2019) 100 each 11  . levothyroxine (SYNTHROID, LEVOTHROID) 25 MCG tablet Take 1 tablet (25 mcg total) by mouth daily before breakfast. 30 tablet 11   No current facility-administered medications on file prior to visit.     Allergies as of 05/12/2019 - Review Complete 05/12/2019  Allergen Reaction Noted  . Lisinopril Swelling 01/20/2018    Family History  Problem Relation Age of Onset  . Diabetes Mother   . Hypertension Father  Social History   Socioeconomic History  . Marital status: Married    Spouse name: Not on file  . Number of children: Not on file  . Years of education: Not on file  . Highest education level: Not on file  Occupational History  . Not on file  Social Needs  . Financial resource strain: Not on file  . Food insecurity    Worry: Not on file    Inability: Not on file  . Transportation needs    Medical: Not on file    Non-medical: Not on file  Tobacco Use  . Smoking status: Never Smoker  . Smokeless tobacco: Never Used  Substance and Sexual Activity  . Alcohol use: No  . Drug use: No  .  Sexual activity: Not on file  Lifestyle  . Physical activity    Days per week: Not on file    Minutes per session: Not on file  . Stress: Not on file  Relationships  . Social Herbalist on phone: Not on file    Gets together: Not on file    Attends religious service: Not on file    Active member of club or organization: Not on file    Attends meetings of clubs or organizations: Not on file    Relationship status: Not on file  . Intimate partner violence    Fear of current or ex partner: Not on file    Emotionally abused: Not on file    Physically abused: Not on file    Forced sexual activity: Not on file  Other Topics Concern  . Not on file  Social History Narrative  . Not on file    Review of Systems:    Constitutional: No weight loss, fever or chills Skin: No rash Cardiovascular: No chest pain Respiratory: No SOB  Gastrointestinal: See HPI and otherwise negative Genitourinary: No dysuria  Neurological: No headache, dizziness or syncope Musculoskeletal: No new muscle or joint pain Hematologic: No bleeding  Psychiatric: No history of depression or anxiety   Physical Exam:  Vital signs: BP 128/90   Pulse 80   Temp 98.3 F (36.8 C)   Ht 5\' 1"  (1.549 m)   Wt 189 lb (85.7 kg)   LMP 03/23/2017   BMI 35.71 kg/m   Constitutional:   Pleasant overweight female appears to be in NAD, Well developed, Well nourished, alert and cooperative Head:  Normocephalic and atraumatic. Eyes:   PEERL, EOMI. No icterus. Conjunctiva pink. Ears:  Normal auditory acuity. Neck:  Supple Throat: Oral cavity and pharynx without inflammation, swelling or lesion.  Respiratory: Respirations even and unlabored. Lungs clear to auscultation bilaterally.   No wheezes, crackles, or rhonchi.  Cardiovascular: Normal S1, S2. No MRG. Regular rate and rhythm. No peripheral edema, cyanosis or pallor.  Gastrointestinal:  Soft, nondistended, Mild Epigastric/ RUQ ttp, No rebound or guarding. Normal  bowel sounds. No appreciable masses or hepatomegaly. Rectal:  Not performed.  Msk:  Symmetrical without gross deformities. Without edema, no deformity or joint abnormality.  Neurologic:  Alert and  oriented x4;  grossly normal neurologically.  Skin:   Dry and intact without significant lesions or rashes. Psychiatric: Demonstrates good judgement and reason without abnormal affect or behaviors.  Assessment: 1.  Epigastric pain: Pain on a daily basis fluctuates in intensity, worse 5 minutes after eating with constant reflux, gallbladder evaluation negative; most likely gastritis versus PUD+/-H. pylori and reflux 2.  GERD  Plan: 1.  Start patient on Pantoprazole  40 mg twice daily, 30 to 60 minutes for breakfast and dinner #60 with 3 refills. 2.  Reviewed antireflux diet and lifestyle modifications. 3.  Discussed with patient that if above measures do not help then would recommend she have an EGD for further evaluation. 4.  Patient to follow in clinic with me in 4 to 6 weeks.  She was assigned to Dr. Havery Moros today.  Ellouise Newer, PA-C [facility] 05/12/2019, 9:20 AM  Cc: Carlena Hurl, PA-C

## 2019-05-12 NOTE — Progress Notes (Signed)
Agree with assessment and plan as outlined. I agree with empiric PPI, I would also screen for H pylori with IgG serology and treat if positive. Can you please order this for her? If serology is positive would treat. If symptoms persist would proceed with EGD. Thanks

## 2019-05-13 ENCOUNTER — Other Ambulatory Visit: Payer: Self-pay

## 2019-05-13 DIAGNOSIS — K219 Gastro-esophageal reflux disease without esophagitis: Secondary | ICD-10-CM

## 2019-05-15 ENCOUNTER — Other Ambulatory Visit: Payer: Self-pay | Admitting: Medical

## 2019-05-22 ENCOUNTER — Other Ambulatory Visit: Payer: Self-pay | Admitting: Medical

## 2019-06-06 ENCOUNTER — Other Ambulatory Visit: Payer: Self-pay | Admitting: Medical

## 2019-06-10 ENCOUNTER — Other Ambulatory Visit: Payer: Self-pay | Admitting: Medical

## 2019-06-10 NOTE — Telephone Encounter (Signed)
Ok to refill med

## 2019-06-21 ENCOUNTER — Other Ambulatory Visit: Payer: Self-pay | Admitting: Medical

## 2019-07-04 ENCOUNTER — Other Ambulatory Visit: Payer: Self-pay | Admitting: Medical

## 2019-07-04 NOTE — Telephone Encounter (Signed)
Is this okay to refill? 

## 2019-07-04 NOTE — Telephone Encounter (Signed)
Lets schedule December med check appt

## 2019-07-12 ENCOUNTER — Other Ambulatory Visit: Payer: Self-pay | Admitting: Internal Medicine

## 2019-07-12 ENCOUNTER — Other Ambulatory Visit: Payer: Self-pay | Admitting: Medical

## 2019-07-15 ENCOUNTER — Other Ambulatory Visit: Payer: Self-pay | Admitting: Medical

## 2019-07-21 ENCOUNTER — Encounter: Payer: Self-pay | Admitting: Medical

## 2019-07-21 ENCOUNTER — Other Ambulatory Visit: Payer: Self-pay

## 2019-07-21 ENCOUNTER — Other Ambulatory Visit: Payer: Self-pay | Admitting: Medical

## 2019-07-21 ENCOUNTER — Other Ambulatory Visit: Payer: Self-pay | Admitting: Internal Medicine

## 2019-07-21 ENCOUNTER — Ambulatory Visit (INDEPENDENT_AMBULATORY_CARE_PROVIDER_SITE_OTHER): Payer: Medicaid Other | Admitting: Medical

## 2019-07-21 VITALS — BP 146/80 | HR 75 | Temp 98.2°F | Ht 61.0 in | Wt 191.2 lb

## 2019-07-21 DIAGNOSIS — K219 Gastro-esophageal reflux disease without esophagitis: Secondary | ICD-10-CM

## 2019-07-21 DIAGNOSIS — E785 Hyperlipidemia, unspecified: Secondary | ICD-10-CM

## 2019-07-21 DIAGNOSIS — M5441 Lumbago with sciatica, right side: Secondary | ICD-10-CM | POA: Diagnosis not present

## 2019-07-21 DIAGNOSIS — M5410 Radiculopathy, site unspecified: Secondary | ICD-10-CM

## 2019-07-21 DIAGNOSIS — E039 Hypothyroidism, unspecified: Secondary | ICD-10-CM

## 2019-07-21 DIAGNOSIS — E876 Hypokalemia: Secondary | ICD-10-CM

## 2019-07-21 DIAGNOSIS — G5793 Unspecified mononeuropathy of bilateral lower limbs: Secondary | ICD-10-CM

## 2019-07-21 DIAGNOSIS — Z1231 Encounter for screening mammogram for malignant neoplasm of breast: Secondary | ICD-10-CM | POA: Diagnosis not present

## 2019-07-21 DIAGNOSIS — G8929 Other chronic pain: Secondary | ICD-10-CM

## 2019-07-21 DIAGNOSIS — Z603 Acculturation difficulty: Secondary | ICD-10-CM

## 2019-07-21 DIAGNOSIS — M5417 Radiculopathy, lumbosacral region: Secondary | ICD-10-CM

## 2019-07-21 DIAGNOSIS — I10 Essential (primary) hypertension: Secondary | ICD-10-CM

## 2019-07-21 DIAGNOSIS — Z789 Other specified health status: Secondary | ICD-10-CM

## 2019-07-21 DIAGNOSIS — K760 Fatty (change of) liver, not elsewhere classified: Secondary | ICD-10-CM

## 2019-07-21 DIAGNOSIS — E1149 Type 2 diabetes mellitus with other diabetic neurological complication: Secondary | ICD-10-CM

## 2019-07-21 MED ORDER — LEVOTHYROXINE SODIUM 25 MCG PO TABS: ORAL_TABLET | ORAL | 1 refills | Status: DC

## 2019-07-21 MED ORDER — TRAMADOL HCL 50 MG PO TABS: ORAL_TABLET | ORAL | 0 refills | Status: DC

## 2019-07-21 MED ORDER — POTASSIUM CHLORIDE CRYS ER 20 MEQ PO TBCR: EXTENDED_RELEASE_TABLET | ORAL | 0 refills | Status: DC

## 2019-07-21 NOTE — Patient Instructions (Addendum)
Please call to schedule your mammogram  [facility]  [phone] [place], [place] [address]    We will refer you to orthopedics since we had trouble getting MRI approved for your ongoing leg and back pain  I refilled your potassium, pain medication, and thyroid medication today   Please call and do a follow up appointment with endocrinology, Dr. Kelton Pillar regarding thyroid.    [phone]   We will request records from [facility]

## 2019-07-21 NOTE — Progress Notes (Signed)
Subjective: Chief Complaint  Patient presents with  . Leg Pain    bilateral   . Back Pain    right is worse   . Foot Pain    left heel    Here today accompanied by her daughter Shawnelle Bouknight, and interpreter Lek Su.  She is mainly here for ongoing right low back pain and leg pain.  However she has not been here for routine follow-up on chronic issues and med check in several months.  Having ongoing pains in low back, worse on right.  Pains in right leg, whole leg.  Has pain in bottom of left foot.   No pain down left leg otherwise.   No recent injury or trauma.   She did complete physical therapy recently but did not feel like it helped at all.  She is still waiting to hear back on MRI lumbar spine  She saw her kidney specialist 2 months ago, had labs, but no medication changes  She is compliant with her medications.  She ran out of tramadol which was helping her back pain  She is trying to stretch and get some home exercise  Past Medical History:  Diagnosis Date  . Diabetes mellitus without complication ([place])   . Fatty liver 2019   elevated LFTs, negative for Hep B and Hep C 04/2018  . Hypertension   . Hypothyroidism 2019  . IgA nephropathy 2018   Dr. Corliss Parish  . Language barrier   . Obesity   . Proteinuria    Current Outpatient Medications on File Prior to Visit  Medication Sig Dispense Refill  . amLODipine (NORVASC) 10 MG tablet Take 1 tablet (10 mg total) by mouth daily. 90 tablet 3  . furosemide (LASIX) 40 MG tablet TAKE 2 TABLETS BY MOUTH EVERY MORNING THEN TAKE 1 TABLET BY MOUTH EVERY EVENING 270 tablet 0  . losartan (COZAAR) 50 MG tablet Take 1 tablet (50 mg total) by mouth daily. 30 tablet 0  . metoprolol succinate (TOPROL-XL) 25 MG 24 hr tablet TAKE 1 TABLET(25 MG) BY MOUTH DAILY. DISCARD VALSARTAN ORDER 90 tablet 1  . pantoprazole (PROTONIX) 40 MG tablet Take 1 tablet (40 mg total) by mouth 2 (two) times daily. 60 tablet 3  . rosuvastatin (CRESTOR) 40 MG  tablet TAKE 1 TABLET(40 MG) BY MOUTH DAILY 90 tablet 3  . ACCU-CHEK AVIVA PLUS test strip TEST TWICE A DAY (Patient not taking: Reported on 07/21/2019) 100 strip 3  . diphenhydrAMINE (BENADRYL) 25 mg capsule Take 1 capsule (25 mg total) by mouth every 12 (twelve) hours for 5 days. 10 capsule 0  . EPINEPHrine 0.3 mg/0.3 mL IJ SOAJ injection Only use during anaphylaxis (Patient not taking: Reported on 07/21/2019) 1 Device 0  . Insulin Pen Needle 32G X 4 MM MISC 1 each by Does not apply route at bedtime. (Patient not taking: Reported on 04/18/2019) 100 each 11   No current facility-administered medications on file prior to visit.   ROS as in subjective    Objective: BP (!) 146/80   Pulse 75   Temp 98.2 F (36.8 C)   Ht 5\' 1"  (1.549 m)   Wt 191 lb 3.2 oz (86.7 kg)   LMP 03/23/2017   SpO2 98%   BMI 36.13 kg/m   Gen: wd, wn, nad Tender right lumbar spine and paraspinal region as well as midline lumbar spine, mild pain with range of motion in general which is somewhat reduced, no deformity Right leg seems to be tender  throughout but no swelling no deformity no palpable cord of calf negative Homans, no hip pain with range of motion, she seems generally tender over the right knee, no swelling of the knee no laxity of the knee, left leg unremarkable 1+ pedal pulses, no obvious edema today in the lower extremities Sensation: Decreased to the feet in general, strength is normal of lower extremities Lungs clear Heart regular rate and rhythm, normal S1-S2, no murmurs Neck supple nontender no mass no lymphadenopathy no thyromegaly    Assessment: Encounter Diagnoses  Name Primary?  . Chronic right-sided low back pain with right-sided sciatica Yes  . Screening mammogram, encounter for   . Essential hypertension   . Gastroesophageal reflux disease, unspecified whether esophagitis present   . Fatty liver   . Type 2 diabetes mellitus with neurological complications ([place])   . Hypothyroidism,  unspecified type   . Neuropathy involving both lower extremities   . Radiculopathy of lumbosacral region   . Morbid obesity ([place])   . Hypokalemia   . Hyperlipidemia, unspecified hyperlipidemia type   . Language barrier   . Radicular pain of right lower extremity      Plan: Chronic right-sided low back pain, radicular leg pain, neuropathy of feet-neuropathy is likely combination of diabetes related as well as lumbar spine radicular issues.  She did not see any improvement with physical therapy.  She does get improvement with tramadol so I will refill this.  We want to avoid NSAIDs in general given her kidney function.  We had a very difficult time trying to get MRI lumbar spine covered with her Medicaid insurance.  We will refer to back specialist/orthopedics for further eval and treatment4  Hypertension -managed by [facility]  Kidney disease-we will request recent records from [facility].  She saw them 2 months ago and had routine labs.  She reportedly had no med changes at that visit.  Diabetes type 2, hypothyroidism -she for whatever reason has not followed up with endocrinology, possibly due to miscommunication with the language barrier.  Labs today  Hyperlipidemia-continue statin  Screening for breast cancer-I advise she call and schedule screening mammogram.  She has never had a mammogram before.  Linda Black was seen today for leg pain, back pain and foot pain.  Diagnoses and all orders for this visit:  Chronic right-sided low back pain with right-sided sciatica  Screening mammogram, encounter for -     MM Digital Screening; Future  Essential hypertension  Gastroesophageal reflux disease, unspecified whether esophagitis present  Fatty liver  Type 2 diabetes mellitus with neurological complications ([facility]) -     Hemoglobin A1c  Hypothyroidism, unspecified type -     TSH regular -     T4, free  Neuropathy involving both lower extremities -     AMB  referral to orthopedics  Radiculopathy of lumbosacral region -     AMB referral to orthopedics  Morbid obesity ([place])  Hypokalemia  Hyperlipidemia, unspecified hyperlipidemia type -     Lipid Panel  Language barrier  Radicular pain of right lower extremity -     AMB referral to orthopedics  Other orders -     traMADol (ULTRAM) 50 MG tablet; TAKE 1 TABLET(50 MG) BY MOUTH EVERY 8 HOURS FOR UP TO 5 DAYS AS NEEDED -     potassium chloride SA (KLOR-CON) 20 MEQ tablet; TAKE 1 TABLET(20 MEQ) BY MOUTH DAILY -     levothyroxine (SYNTHROID) 25 MCG tablet; TAKE 1 TABLET(25 MCG) BY MOUTH DAILY BEFORE  BREAKFAST

## 2019-07-22 LAB — LIPID PANEL
Chol/HDL Ratio: 2.6 ratio (ref 0.0–4.4)
Cholesterol, Total: 168 mg/dL (ref 100–199)
HDL: 65 mg/dL (ref >39)
LDL Chol Calc (NIH): 67 mg/dL (ref 0–99)
Triglycerides: 227 mg/dL — ABNORMAL HIGH (ref 0–149)
VLDL Cholesterol Cal: 36 mg/dL (ref 5–40)

## 2019-07-22 LAB — T4, FREE: Free T4: 1.04 ng/dL (ref 0.82–1.77)

## 2019-07-22 LAB — TSH: TSH: 2.13 u[IU]/mL (ref 0.450–4.500)

## 2019-07-22 LAB — HEMOGLOBIN A1C
Est. average glucose Bld gHb Est-mCnc: 114 mg/dL
Hgb A1c MFr Bld: 5.6 % (ref 4.8–5.6)

## 2019-08-04 ENCOUNTER — Other Ambulatory Visit: Payer: Self-pay

## 2019-08-04 ENCOUNTER — Ambulatory Visit (INDEPENDENT_AMBULATORY_CARE_PROVIDER_SITE_OTHER): Payer: Medicaid Other | Admitting: Surgery

## 2019-08-04 ENCOUNTER — Ambulatory Visit: Payer: Self-pay

## 2019-08-04 DIAGNOSIS — M5441 Lumbago with sciatica, right side: Secondary | ICD-10-CM

## 2019-08-04 DIAGNOSIS — G8929 Other chronic pain: Secondary | ICD-10-CM

## 2019-08-04 DIAGNOSIS — M5442 Lumbago with sciatica, left side: Secondary | ICD-10-CM

## 2019-08-04 DIAGNOSIS — M4316 Spondylolisthesis, lumbar region: Secondary | ICD-10-CM

## 2019-08-04 NOTE — Progress Notes (Signed)
Office Visit Note   Patient: Linda Black           Date of Birth: [birth year]           MRN: [id number] Visit Date: 08/04/2019              Requested by: Carlena Hurl, PA-C [address] [place],  [place] [address] PCP: Carlena Hurl, PA-C   Assessment & Plan: Visit Diagnoses:  1. Chronic bilateral low back pain with bilateral sciatica   2. Spondylolisthesis of lumbar region     Plan: At this point patient has failed multiple modes of conservative management provided by primary care PA Chana Bode which does include formal PT.  Patient's past medical history is significant for liver issues, renal issues, diabetes.  I do not recommend giving her or advising her to take Tylenol, oral NSAIDs or oral prednisone.  I will order stat lumbar MRI due to her progressive symptoms.  Scan was ordered previously by Chana Bode, PA which I think was very appropriate but for some reason it was not approved by Medicaid.  I think the scan is indicated due to her x-ray findings, duration of symptoms and exam findings.  She will follow-up with my attending Dr. Basil Dess after completion of the study to discuss results and further treatment options.  All questions answered.  Interpreter was extremely helpful during office visit.  Follow-Up Instructions: Return in about 2 weeks (around 08/18/2019) for Needs return office visit with Dr. Louanne Skye in 2 weeks to review lumbar MRI.   Orders:  Orders Placed This Encounter  Procedures  . XR Lumbar Spine 2-3 Views  . MR Lumbar Spine w/o contrast   No orders of the defined types were placed in this encounter.     Procedures: No procedures performed   Clinical Data: No additional findings.   Subjective: Chief Complaint  Patient presents with  . Lower Back - Pain    HPI 48 year old female comes in today with chronic low back pain and bilateral lower extremity radiculopathy.  States that this is been ongoing for several months.  She has  been followed by her primary care provider was attempted multiple methods of conservative treatment but these all have failed.  States that low back pain radiates down to the right knee and also down to the left ankle.  Describes pain as being constant.  Aggravated with walking.  Uses a cane and does have to hold onto a grocery cart when she is in the store.  No complaints of bowel or bladder incontinence.   Objective: Vital Signs: LMP 03/23/2017   Physical Exam HENT:     Head: Normocephalic.  Eyes:     Extraocular Movements: Extraocular movements intact.     Pupils: Pupils are equal, round, and reactive to light.  Pulmonary:     Effort: No respiratory distress.  Musculoskeletal:     Comments: Gait is somewhat antalgic.  Patient has low back pain with lumbar extension.  Lumbar flexion hands to knees with discomfort.  Positive bilateral lumbar paraspinal tenderness/spasm.  Positive bilateral sciatic notch tenderness.  Negative logroll bilateral hips.  Positive right greater than left straight leg raise.  Bilateral knees good range of motion.  No joint swelling.  Bilateral calves nontender.  No focal motor deficits.  Neurological:     General: No focal deficit present.     Mental Status: She is alert and oriented to person, place, and time.  Psychiatric:  Mood and Affect: Mood normal.     Ortho Exam  Specialty Comments:  No specialty comments available.  Imaging: No results found.   PMFS History: Patient Active Problem List   Diagnosis Date Noted  . Radicular pain of right lower extremity 04/18/2019  . Other intervertebral disc degeneration, lumbar region 04/18/2019  . Radiculopathy of lumbosacral region 03/21/2019  . Right leg pain 03/21/2019  . Impaired fasting blood sugar 02/23/2019  . Cold sore 10/18/2018  . Chest pain 09/23/2018  . Neuropathy involving both lower extremities 09/23/2018  . Screen for colon cancer 05/17/2018  . Vaccine counseling 05/17/2018  .  Fatty liver 05/17/2018  . Screening for cervical cancer 05/17/2018  . Hypothyroidism 05/17/2018  . Need for pneumococcal vaccination 05/17/2018  . Generalized abdominal tenderness 05/17/2018  . Need for influenza vaccination 04/13/2018  . Other fatigue 02/08/2018  . Edema 01/25/2018  . Renal failure (ARF), acute on chronic (HCC) 01/20/2018  . Normocytic normochromic anemia 01/20/2018  . Tremor 01/05/2018  . Vitamin D deficiency 01/05/2018  . RUQ abdominal pain 10/27/2017  . Hemorrhoids 10/27/2017  . High risk medication use 09/25/2017  . Type 2 diabetes mellitus with neurological complications ([place]) [id number]  . Proteinuria 09/02/2017  . Paresthesia 09/02/2017  . IgA nephropathy 06/10/2017  . Language barrier 06/10/2017  . Gastroesophageal reflux disease 06/10/2017  . SOB (shortness of breath) 02/24/2017  . Hyperlipidemia 04/21/2016  . Chronic right-sided low back pain with right-sided sciatica 01/29/2016  . Morbid obesity ([place]) 01/29/2016  . Hypokalemia 01/29/2016  . Essential hypertension 01/29/2016   Past Medical History:  Diagnosis Date  . Diabetes mellitus without complication ([place])   . Fatty liver 2019   elevated LFTs, negative for Hep B and Hep C 04/2018  . Hypertension   . Hypothyroidism 2019  . IgA nephropathy 2018   Dr. Corliss Parish  . Language barrier   . Obesity   . Proteinuria     Family History  Problem Relation Age of Onset  . Diabetes Mother   . Hypertension Father   . Colon cancer Neg Hx   . Stomach cancer Neg Hx   . Esophageal cancer Neg Hx   . Rectal cancer Neg Hx   . Liver cancer Neg Hx     Past Surgical History:  Procedure Laterality Date  . RENAL BIOPSY  2018   IgA nephropathy, Dr. Corliss Parish   Social History   Occupational History  . Occupation: homemaker  Tobacco Use  . Smoking status: Never Smoker  . Smokeless tobacco: Never Used  Substance and Sexual Activity  . Alcohol use: Never  . Drug use: No  . Sexual  activity: Not on file

## 2019-08-13 ENCOUNTER — Other Ambulatory Visit: Payer: Medicaid Other

## 2019-08-17 ENCOUNTER — Other Ambulatory Visit: Payer: Self-pay | Admitting: Medical

## 2019-08-18 ENCOUNTER — Other Ambulatory Visit: Payer: Self-pay | Admitting: Internal Medicine

## 2019-08-24 ENCOUNTER — Ambulatory Visit
Admission: RE | Admit: 2019-08-24 | Discharge: 2019-08-24 | Disposition: A | Discharge status: Home / Self Care | Payer: Medicaid Other | Source: Ambulatory Visit | Attending: Surgery | Admitting: Surgery

## 2019-08-24 ENCOUNTER — Other Ambulatory Visit: Payer: Self-pay

## 2019-08-24 DIAGNOSIS — M4316 Spondylolisthesis, lumbar region: Secondary | ICD-10-CM

## 2019-08-25 ENCOUNTER — Ambulatory Visit (INDEPENDENT_AMBULATORY_CARE_PROVIDER_SITE_OTHER): Payer: Medicaid Other | Admitting: Specialist

## 2019-08-25 ENCOUNTER — Encounter: Payer: Self-pay | Admitting: Specialist

## 2019-08-25 VITALS — BP 153/92 | HR 71 | Ht 61.0 in | Wt 191.0 lb

## 2019-08-25 DIAGNOSIS — M1711 Unilateral primary osteoarthritis, right knee: Secondary | ICD-10-CM

## 2019-08-25 DIAGNOSIS — M48062 Spinal stenosis, lumbar region with neurogenic claudication: Secondary | ICD-10-CM | POA: Diagnosis not present

## 2019-08-25 DIAGNOSIS — M4316 Spondylolisthesis, lumbar region: Secondary | ICD-10-CM

## 2019-08-25 MED ORDER — GABAPENTIN 100 MG PO CAPS: 100.0000 mg | ORAL_CAPSULE | Freq: Every day | ORAL | 3 refills | Status: DC

## 2019-08-25 NOTE — Progress Notes (Signed)
Office Visit Note   Patient: Linda Black           Date of Birth: 02-08-1971           MRN: [id number] Visit Date: 08/25/2019              Requested by: Carlena Hurl, PA-C [address] [place],  [place] [address] PCP: Carlena Hurl, PA-C   Assessment & Plan: Visit Diagnoses:  1. Unilateral primary osteoarthritis, right knee   2. Spinal stenosis of lumbar region with neurogenic claudication   3. Spondylolisthesis of lumbar region     Plan: The main ways of treat osteoarthritis, that are found to be success. Weight loss helps to decrease pain. Exercise is important to maintaining cartilage and thickness and strengthening. NSAIDs like tylenol are meds decreasing the inflamation.She should not take motrin or alleve or other stronger arthritis medications Due to risk of injuring her kidneys.Ice is okay  In afternoon and evening and hot shower in the am Avoid bending, stooping and avoid lifting weights greater than 10 lbs. Avoid prolong standing and walking. Avoid frequent bending and stooping  No lifting greater than 10 lbs. May use ice or moist heat for pain. Weight loss is of benefit. Handicap license is approved.    Follow-Up Instructions: No follow-ups on file.   Orders:  No orders of the defined types were placed in this encounter.  No orders of the defined types were placed in this encounter.     Procedures: No procedures performed   Clinical Data: No additional findings.   Subjective: Chief Complaint  Patient presents with  . Lower Back - Follow-up    MRI Lumbar Spine review    HPI  Review of Systems   Objective: Vital Signs: BP (!) 153/92 (BP Location: Left Arm, Patient Position: Sitting)   Pulse 71   Ht 5\' 1"  (1.549 m)   Wt 191 lb (86.6 kg)   LMP 03/23/2017   BMI 36.09 kg/m   Physical Exam  Ortho Exam  Specialty Comments:  No specialty comments available.  Imaging: No results found.   PMFS History: Patient Active  Problem List   Diagnosis Date Noted  . Radicular pain of right lower extremity 04/18/2019  . Other intervertebral disc degeneration, lumbar region 04/18/2019  . Radiculopathy of lumbosacral region 03/21/2019  . Right leg pain 03/21/2019  . Impaired fasting blood sugar 02/23/2019  . Cold sore 10/18/2018  . Chest pain 09/23/2018  . Neuropathy involving both lower extremities 09/23/2018  . Screen for colon cancer 05/17/2018  . Vaccine counseling 05/17/2018  . Fatty liver 05/17/2018  . Screening for cervical cancer 05/17/2018  . Hypothyroidism 05/17/2018  . Need for pneumococcal vaccination 05/17/2018  . Generalized abdominal tenderness 05/17/2018  . Need for influenza vaccination 04/13/2018  . Other fatigue 02/08/2018  . Edema 01/25/2018  . Renal failure (ARF), acute on chronic (HCC) 01/20/2018  . Normocytic normochromic anemia 01/20/2018  . Tremor 01/05/2018  . Vitamin D deficiency 01/05/2018  . RUQ abdominal pain 10/27/2017  . Hemorrhoids 10/27/2017  . High risk medication use 09/25/2017  . Type 2 diabetes mellitus with neurological complications ([place]) [id number]  . Proteinuria 09/02/2017  . Paresthesia 09/02/2017  . IgA nephropathy 06/10/2017  . Language barrier 06/10/2017  . Gastroesophageal reflux disease 06/10/2017  . SOB (shortness of breath) 02/24/2017  . Hyperlipidemia 04/21/2016  . Chronic right-sided low back pain with right-sided sciatica 01/29/2016  . Morbid obesity ([place]) 01/29/2016  . Hypokalemia 01/29/2016  .  Essential hypertension 01/29/2016   Past Medical History:  Diagnosis Date  . Diabetes mellitus without complication ([place])   . Fatty liver 2019   elevated LFTs, negative for Hep B and Hep C 04/2018  . Hypertension   . Hypothyroidism 2019  . IgA nephropathy 2018   Dr. Corliss Parish  . Language barrier   . Obesity   . Proteinuria     Family History  Problem Relation Age of Onset  . Diabetes Mother   . Hypertension Father   . Colon cancer Neg  Hx   . Stomach cancer Neg Hx   . Esophageal cancer Neg Hx   . Rectal cancer Neg Hx   . Liver cancer Neg Hx     Past Surgical History:  Procedure Laterality Date  . RENAL BIOPSY  2018   IgA nephropathy, Dr. Corliss Parish   Social History   Occupational History  . Occupation: homemaker  Tobacco Use  . Smoking status: Never Smoker  . Smokeless tobacco: Never Used  Substance and Sexual Activity  . Alcohol use: Never  . Drug use: No  . Sexual activity: Not on file

## 2019-08-25 NOTE — Patient Instructions (Signed)
Plan: The main ways of treat osteoarthritis, that are found to be success. Weight loss helps to decrease pain. Exercise is important to maintaining cartilage and thickness and strengthening. NSAIDs like tylenol are meds decreasing the inflamation.She should not take motrin or alleve or other stronger arthritis medications Due to risk of injuring her kidneys.Ice is okay  In afternoon and evening and hot shower in the am Avoid bending, stooping and avoid lifting weights greater than 10 lbs. Avoid prolong standing and walking. Avoid frequent bending and stooping  No lifting greater than 10 lbs. May use ice or moist heat for pain. Weight loss is of benefit. Handicap license is approved.

## 2019-08-28 ENCOUNTER — Other Ambulatory Visit: Payer: Self-pay | Admitting: Internal Medicine

## 2019-08-29 ENCOUNTER — Other Ambulatory Visit: Payer: Self-pay | Admitting: Medical

## 2019-09-07 ENCOUNTER — Ambulatory Visit: Payer: Medicaid Other

## 2019-09-07 ENCOUNTER — Ambulatory Visit
Admission: RE | Admit: 2019-09-07 | Discharge: 2019-09-07 | Disposition: A | Discharge status: Home / Self Care | Payer: Medicaid Other | Source: Ambulatory Visit | Attending: Medical | Admitting: Medical

## 2019-09-07 ENCOUNTER — Telehealth: Payer: Self-pay

## 2019-09-07 ENCOUNTER — Other Ambulatory Visit: Payer: Self-pay

## 2019-09-07 DIAGNOSIS — Z1231 Encounter for screening mammogram for malignant neoplasm of breast: Secondary | ICD-10-CM

## 2019-09-07 NOTE — Telephone Encounter (Signed)
Pt actually had another referral in for other imaging and they never rescheduled . Looks as if pt is now being taking care of by Dr. Louanne Skye. [place]

## 2019-09-07 NOTE — Telephone Encounter (Signed)
Called to advise pt of closing of referral due to non-scheduling. No answer and could not leave voice mail due to both numbers mail box full. [place] 2-3--21

## 2019-09-07 NOTE — Telephone Encounter (Signed)
Pt. Son called back stating that his mom had a missed call I told him it was regarding her referral she had in the system that were being closed due to the fact they could not contact the pt. He told me she has been scheduled for her mammogram already and has already been scheduled with Ortho.

## 2019-09-11 ENCOUNTER — Other Ambulatory Visit: Payer: Self-pay | Admitting: Medical

## 2019-09-15 ENCOUNTER — Other Ambulatory Visit: Payer: Self-pay | Admitting: Medical

## 2019-09-19 ENCOUNTER — Ambulatory Visit: Payer: Medicaid Other

## 2019-09-21 ENCOUNTER — Encounter: Payer: Self-pay | Admitting: Physical Therapy

## 2019-09-21 ENCOUNTER — Other Ambulatory Visit: Payer: Self-pay

## 2019-09-21 ENCOUNTER — Ambulatory Visit: Payer: Medicaid Other | Attending: Specialist | Admitting: Physical Therapy

## 2019-09-21 DIAGNOSIS — R531 Weakness: Secondary | ICD-10-CM | POA: Diagnosis present

## 2019-09-21 DIAGNOSIS — M545 Low back pain: Secondary | ICD-10-CM | POA: Insufficient documentation

## 2019-09-21 DIAGNOSIS — M25561 Pain in right knee: Secondary | ICD-10-CM | POA: Diagnosis present

## 2019-09-21 DIAGNOSIS — G8929 Other chronic pain: Secondary | ICD-10-CM | POA: Diagnosis present

## 2019-09-21 DIAGNOSIS — M62830 Muscle spasm of back: Secondary | ICD-10-CM | POA: Insufficient documentation

## 2019-09-21 NOTE — Patient Instructions (Signed)
Access Code: [email or web address]  URL: [email or web address]  Date: 09/21/2019  Prepared by: Jeral Pinch   Exercises Supine Single Knee to Chest Stretch - 2 reps - 20-30 hold - 2x daily Supine Lower Trunk Rotation - 2 reps - 20-30 hold - 2x daily Supine Pelvic Tilt - 10 reps - 3 sets - 2x daily Quad Set - 10 reps - 5-10 hold - 2x daily

## 2019-09-21 NOTE — Therapy (Signed)
Linda Black, [name], [address] Phone: [phone]   Fax:  [phone]  Physical Therapy Evaluation  Patient Details  Name: Linda Black MRN: [id number] Date of Birth: [birth year] Referring Provider (PT): Dr Basil Dess    Encounter Date: 09/21/2019  PT End of Session - 09/21/19 0925    Visit Number  1    Number of Visits  12    Date for PT Re-Evaluation  11/02/19    Authorization Type  MCD request 3 initial visits    PT Start Time  0925    PT Stop Time  1013    PT Time Calculation (min)  48 min    Activity Tolerance  Patient tolerated treatment well    Behavior During Therapy  [facility] for tasks assessed/performed       Past Medical History:  Diagnosis Date  . Diabetes mellitus without complication ([place])   . Fatty liver 2019   elevated LFTs, negative for Hep B and Hep C 04/2018  . Hypertension   . Hypothyroidism 2019  . IgA nephropathy 2018   Dr. Corliss Parish  . Language barrier   . Obesity   . Proteinuria     Past Surgical History:  Procedure Laterality Date  . RENAL BIOPSY  2018   IgA nephropathy, Dr. Corliss Parish    There were no vitals filed for this visit.   Subjective Assessment - 09/21/19 0926    Subjective  Pt had PT at our [facility] - she stopped the exercise as they were causing her pain.  She was referred to this clinic because it is closer to her house. She used to be able clean the house and lean forward and get on the ground and now she isn't able d/t popping in her knee and back pain.    Patient is accompained by:  Interpreter   ykeo eban   Pertinent History  Had PT for this prior - she has stopped doing these because they hurt    How long can you walk comfortably?  has a cane as needed, pain with > 100'    Diagnostic tests  s-rays - spondylolisthesis    Patient Stated Goals  MD told her to come here for an assessment then she sees MD tomorrow.    Currently in Pain?  Yes    Pain Score  4    pain the same in both places   Pain Location  Back   and Rt knee   Pain Orientation  Right;Lower   Rt knee, mid low back   Pain Descriptors / Indicators  Tightness;Burning;Aching    Pain Type  Chronic pain    Pain Onset  More than a month ago    Pain Frequency  Constant    Aggravating Factors   walking bending over    Pain Relieving Factors  medication - perscription         [facility] PT Assessment - 09/21/19 0001      Assessment   Medical Diagnosis  Lumbar spondylolesthesis, Rt knee pain    Referring Provider (PT)  Dr Basil Dess     Onset Date/Surgical Date  09/20/18    Hand Dominance  Right    Next MD Visit  09/22/2019    Prior Therapy  04/2019      Precautions   Precautions  Other (comment)    Precaution Comments  nothing in particular  Balance Screen   Has the patient fallen in the past 6 months  No    Has the patient had a decrease in activity level because of a fear of falling?   No    Is the patient reluctant to leave their home because of a fear of falling?   No      Home Film/video editor residence    Home Layout  One level      Prior Function   Level of [place]  Unemployed    Leisure  garden,      Functional Tests   Functional tests  Squat;Single leg stance      Squat   Comments  slow and limited motion      Single Leg Stance   Comments  bilat 7 sec      Posture/Postural Control   Posture/Postural Control  Postural limitations    Postural Limitations  Increased lumbar lordosis   obsetiy     ROM / Strength   AROM / PROM / Strength  AROM;Strength      AROM   Overall AROM Comments  lateral tracking Rt patellla     AROM Assessment Site  Hip;Lumbar;Knee    Right/Left Hip  --   WNL   Right/Left Knee  --   flex Lt 135, Rt 125   Lumbar Flexion  mid shin - LBP    Lumbar Extension  NA d/t dx    Lumbar - Right Rotation  25% present pain in back     Lumbar - Left Rotation  50% present pain in bakc      Strength   Strength Assessment Site  Knee;Hip;Ankle;Lumbar    Right/Left Hip  --   WNL except ext 3+/5 with back    Right/Left Knee  --   Lt 5/5, Rt 4+/5   Right/Left Ankle  --   WNL   Lumbar Flexion  --   TA poor   Lumbar Extension  --   lumbar multifidi fair Rt with delayed contraction      Flexibility   Soft Tissue Assessment /Muscle Length  yes    Hamstrings  supine SLR 90 bilat    Quadriceps  WNL in prone knee flex     Piriformis  tight Rt       Palpation   Spinal mobility  NA d/t dx    Palpation comment  tight and tender Rt lumbar paraspinal and bilat gluts/piriformis.                 Objective measurements completed on examination: See above findings.      [place] Adult PT Treatment/Exercise - 09/21/19 0001      Self-Care   Self-Care  Other Self-Care Comments    Other Self-Care Comments   no lumbar extension - d/t dx      Exercises   Exercises  Lumbar      Lumbar Exercises: Stretches   Single Knee to Chest Stretch  Left;Right;30 seconds    Lower Trunk Rotation  1 rep;30 seconds      Lumbar Exercises: Seated   Other Seated Lumbar Exercises  long sit quad set - pt with less pain reported on Rt knee after taping      Lumbar Exercises: Supine   Pelvic Tilt  20 reps      Manual Therapy   Manual Therapy  Taping    Kinesiotex  Facilitate Muscle  Kinesiotix   Facilitate Muscle   for lateral tracking Rt patella, K strip lateral with I strip to pull patella medial.              PT Education - 09/21/19 1402    Education Details  POC and HEP, reason for taping    Person(s) Educated  Patient;Child(ren);Other (comment)   interpretor   Methods  Demonstration;Explanation;Handout    Comprehension  Returned demonstration;Verbalized understanding;Verbal cues required          PT Long Term Goals - 09/21/19 1425      PT LONG TERM GOAL #1   Title  I with HEP    Baseline  stopped doing  all exercise d/t pain    Time  6    Period  Weeks    Status  New    Target Date  11/02/19      PT LONG TERM GOAL #2   Title  demo full and painfree lumbar ROM all except extension    Baseline  limited in flexion and bilat rotation - pain, extension contraindicated    Time  6    Period  Weeks    Status  New    Target Date  11/02/19      PT LONG TERM GOAL #3   Title  improve bilat hip extension  4+/5 and core strength to good to support spine    Baseline  hip ext 3+/5, core fair    Time  6    Period  Weeks    Status  New    Target Date  11/02/19      PT LONG TERM GOAL #4   Title  report =/> 75% reduction of overall pain    Baseline  4/10 on a good day.    Time  6    Period  Weeks    Status  New    Target Date  11/02/19             Plan - 09/21/19 1403    Clinical Impression Statement  49 yo female presents to PT with dx of lumbar spondylolethesis and Rt knee pain.  She is with an interpretor.  She had a couple sessions of PT last year however stopped d/t having increased pain.  Currently she has painful and limited lumbar motion, generalized weakness in her hips and knees, lateral tracking of the Rt patella and muscular tightness.  Her ortho MD referred her back to PT and she is here as this facility is closer to her house.  She expressed through interpretor that she would be able to participate in therapy.  PT can help reduce muscular tightness, restore ROM/strength and reduce pain.  She reports if it doesn't help MD has mentioned surgery.    Personal Factors and Comorbidities  Comorbidity 3+;Behavior Pattern;Past/Current Experience;Social Background    Comorbidities  DM, HTN, obesity    Examination-Activity Limitations  Bathing;Carry;Lift    Examination-Participation Restrictions  Laundry;Other;Yard Work;Community Activity;Cleaning    Stability/Clinical Decision Making  Evolving/Moderate complexity    Clinical Decision Making  Moderate    Rehab Potential  Fair    PT  Frequency  2x / week    PT Duration  6 weeks    PT Treatment/Interventions  Taping;Iontophoresis 4mg /ml Dexamethasone;Patient/family education;Functional mobility training;Moist Heat;Ultrasound;Traction;Therapeutic activities;Passive range of motion;Therapeutic exercise;Cryotherapy;Electrical Stimulation;Balance training;Manual techniques;Dry needling;Spinal Manipulations;Joint Manipulations    PT Next Visit Plan  progress core and LE strengthening, manual work to back PRN, if taping helped continue.  Consulted and Agree with Plan of Care  Patient;Other (Comment)   interpretor      Patient will benefit from skilled therapeutic intervention in order to improve the following deficits and impairments:  Decreased range of motion, Obesity, Increased muscle spasms, Decreased knowledge of precautions, Pain, Decreased strength  Visit Diagnosis: Chronic pain of right knee  Chronic bilateral low back pain without sciatica  General weakness  Muscle spasm of back     Problem List Patient Active Problem List   Diagnosis Date Noted  . Radicular pain of right lower extremity 04/18/2019  . Other intervertebral disc degeneration, lumbar region 04/18/2019  . Radiculopathy of lumbosacral region 03/21/2019  . Right leg pain 03/21/2019  . Impaired fasting blood sugar 02/23/2019  . Cold sore 10/18/2018  . Chest pain 09/23/2018  . Neuropathy involving both lower extremities 09/23/2018  . Screen for colon cancer 05/17/2018  . Vaccine counseling 05/17/2018  . Fatty liver 05/17/2018  . Screening for cervical cancer 05/17/2018  . Hypothyroidism 05/17/2018  . Need for pneumococcal vaccination 05/17/2018  . Generalized abdominal tenderness 05/17/2018  . Need for influenza vaccination 04/13/2018  . Other fatigue 02/08/2018  . Edema 01/25/2018  . Renal failure (ARF), acute on chronic (HCC) 01/20/2018  . Normocytic normochromic anemia 01/20/2018  . Tremor 01/05/2018  . Vitamin D deficiency  01/05/2018  . RUQ abdominal pain 10/27/2017  . Hemorrhoids 10/27/2017  . High risk medication use 09/25/2017  . Type 2 diabetes mellitus with neurological complications ([place]) [id number]  . Proteinuria 09/02/2017  . Paresthesia 09/02/2017  . IgA nephropathy 06/10/2017  . Language barrier 06/10/2017  . Gastroesophageal reflux disease 06/10/2017  . SOB (shortness of breath) 02/24/2017  . Hyperlipidemia 04/21/2016  . Chronic right-sided low back pain with right-sided sciatica 01/29/2016  . Morbid obesity ([place]) 01/29/2016  . Hypokalemia 01/29/2016  . Essential hypertension 01/29/2016    Jeral Pinch PT  09/21/2019, 2:30 PM  [address] [place], [name], [address] Phone: [phone]   Fax:  [phone]  Name: Linda Black MRN: [id number] Date of Birth: [birth year]

## 2019-09-22 ENCOUNTER — Ambulatory Visit: Payer: Medicaid Other | Admitting: Specialist

## 2019-09-28 ENCOUNTER — Other Ambulatory Visit: Payer: Self-pay

## 2019-09-28 ENCOUNTER — Encounter: Payer: Self-pay | Admitting: Specialist

## 2019-09-28 ENCOUNTER — Ambulatory Visit (INDEPENDENT_AMBULATORY_CARE_PROVIDER_SITE_OTHER): Payer: Medicaid Other | Admitting: Specialist

## 2019-09-28 VITALS — BP 141/87 | HR 65 | Ht 61.0 in | Wt 191.0 lb

## 2019-09-28 DIAGNOSIS — M5441 Lumbago with sciatica, right side: Secondary | ICD-10-CM | POA: Diagnosis not present

## 2019-09-28 DIAGNOSIS — M5442 Lumbago with sciatica, left side: Secondary | ICD-10-CM

## 2019-09-28 DIAGNOSIS — M48062 Spinal stenosis, lumbar region with neurogenic claudication: Secondary | ICD-10-CM | POA: Diagnosis not present

## 2019-09-28 DIAGNOSIS — G8929 Other chronic pain: Secondary | ICD-10-CM

## 2019-09-28 DIAGNOSIS — M1711 Unilateral primary osteoarthritis, right knee: Secondary | ICD-10-CM

## 2019-09-28 MED ORDER — BUPIVACAINE HCL 0.25 % IJ SOLN
4.0000 mL | INTRAMUSCULAR | Status: AC | PRN
  Administered 2019-09-28: 4 mL via INTRA_ARTICULAR

## 2019-09-28 MED ORDER — METHYLPREDNISOLONE ACETATE 40 MG/ML IJ SUSP
40.0000 mg | INTRAMUSCULAR | Status: AC | PRN
  Administered 2019-09-28: 40 mg via INTRA_ARTICULAR

## 2019-09-28 NOTE — Progress Notes (Signed)
Office Visit Note   Patient: Linda Black           Date of Birth: 03-31-1971           MRN: [id number] Visit Date: 09/28/2019              Requested by: Carlena Hurl, PA-C [address] [place],  [place] [address] PCP: Carlena Hurl, PA-C   Assessment & Plan: Visit Diagnoses:  1. Unilateral primary osteoarthritis, right knee   2. Spinal stenosis of lumbar region with neurogenic claudication   3. Chronic bilateral low back pain with bilateral sciatica     Plan: Avoid bending, stooping and avoid lifting weights greater than 10 lbs. Avoid prolong standing and walking. Avoid frequent bending and stooping  No lifting greater than 10 lbs. May use ice or moist heat for pain. Weight loss is of benefit. Handicap license is approved. Dr. Romona Curls secretary/Assistant will call to arrange for epidural steroid injection    Right knee arthritis The main ways of treat osteoarthritis, that are found to be success. Weight loss helps to decrease pain. Exercise is important to maintaining cartilage and thickness and strengthening. NSAIDs like motrin, tylenol, alleve are meds decreasing the inflamation. Ice is okay  In afternoon and evening and hot shower in the am   Hemp CBD capsules, amazon.com 5,000-7,000 mg per bottle, 60 capsules per bottle, take one capsule twice a day. Cane in the right hand to use with right leg weight bearing. Follow-Up Instructions: No follow-ups on file.  Follow-Up Instructions: No follow-ups on file.   Orders:  Orders Placed This Encounter  Procedures  . Ambulatory referral to Physical Medicine Rehab   No orders of the defined types were placed in this encounter.     Procedures: Large Joint Inj: R knee on 09/28/2019 4:51 PM Indications: pain Details: 25 G 1.5 in needle, anteromedial approach  Arthrogram: No  Medications: 40 mg methylPREDNISolone acetate 40 MG/ML; 4 mL bupivacaine 0.25 % Outcome: tolerated well, no immediate  complications Procedure, treatment alternatives, risks and benefits explained, specific risks discussed. Consent was given by the patient. Immediately prior to procedure a time out was called to verify the correct patient, procedure, equipment, support staff and site/side marked as required. Patient was prepped and draped in the usual sterile fashion.       Clinical Data: Findings:  CLINICAL DATA:  Low back pain and bilateral lower extremity radiculopathy  EXAM: MRI LUMBAR SPINE WITHOUT CONTRAST  TECHNIQUE: Multiplanar, multisequence MR imaging of the lumbar spine was performed. No intravenous contrast was administered.  COMPARISON:  None.  FINDINGS: Segmentation:  Standard.  Alignment:  No significant listhesis.  Vertebrae: Vertebral body heights are maintained. There is no significant marrow edema or suspicious osseous lesion identified  Conus medullaris and cauda equina: Conus extends to the L1 level. Conus and cauda equina appear normal.  Paraspinal and other soft tissues: Unremarkable.  Disc levels:  L1-L2:  No significant canal or foraminal stenosis.  L2-L3:  No significant canal or foraminal stenosis.  L3-L4: Disc bulge and facet arthropathy with ligamentum flavum infolding. Mild canal stenosis. No significant foraminal stenosis.  L4-L5: Disc bulge with superimposed central disc protrusion and annular fissure. Endplate osteophytic ridging and facet arthropathy with ligamentum flavum infolding. Moderate canal stenosis with narrowing of lateral recesses, left greater than right. Mild to moderate foraminal stenosis, left greater than right.  L5-S1: Disc bulge with right subarticular annular fissure and facet arthropathy with ligamentum flavum infolding. No significant  canal stenosis with partial effacement of the right lateral recess. Minor foraminal stenosis.  IMPRESSION: Multilevel degenerative changes as detailed above, greatest at  L4-L5 and L5-S1.   Electronically Signed   By: Macy Mis M.D.   On: 08/24/2019 09:25       Subjective: Chief Complaint  Patient presents with  . Lower Back - Follow-up    49 year old female with history of back pain and right knee pain, the right knee pain is worse with standing and squatting and therapy is working to improve her right knee pain with patella taping. She has applied for disability and has applied for therapy. She has back pain with standing and walking and sitting and it improves with sitting when she has been standing too long. She has the right knee pain and she experiences  numbness or tingling into both leg and feet and she has pain into the left plantar heel. The feet hurt too much in the left heel withstanding and walking. On the left the heel is painful and on the right the right knee is painful.   Review of Systems  Constitutional: Negative.   HENT: Negative.   Eyes: Negative.   Respiratory: Negative.   Cardiovascular: Negative.   Gastrointestinal: Negative.   Endocrine: Negative.   Genitourinary: Negative.   Musculoskeletal: Negative.   Skin: Negative.   Allergic/Immunologic: Negative.   Neurological: Negative.   Hematological: Negative.   Psychiatric/Behavioral: Negative.      Objective: Vital Signs: BP (!) 141/87 (BP Location: Left Arm, Patient Position: Sitting)   Pulse 65   Ht 5\' 1"  (1.549 m)   Wt 191 lb (86.6 kg)   LMP 03/23/2017   BMI 36.09 kg/m   Physical Exam Constitutional:      Appearance: She is well-developed.  HENT:     Head: Normocephalic and atraumatic.  Eyes:     Pupils: Pupils are equal, round, and reactive to light.  Pulmonary:     Effort: Pulmonary effort is normal.     Breath sounds: Normal breath sounds.  Abdominal:     General: Bowel sounds are normal.     Palpations: Abdomen is soft.  Musculoskeletal:        General: Normal range of motion.     Cervical back: Normal range of motion and neck supple.   Skin:    General: Skin is warm and dry.  Neurological:     Mental Status: She is alert and oriented to person, place, and time.  Psychiatric:        Behavior: Behavior normal.        Thought Content: Thought content normal.        Judgment: Judgment normal.     Ortho Exam  Specialty Comments:  No specialty comments available.  Imaging: No results found.   PMFS History: Patient Active Problem List   Diagnosis Date Noted  . Radicular pain of right lower extremity 04/18/2019  . Other intervertebral disc degeneration, lumbar region 04/18/2019  . Radiculopathy of lumbosacral region 03/21/2019  . Right leg pain 03/21/2019  . Impaired fasting blood sugar 02/23/2019  . Cold sore 10/18/2018  . Chest pain 09/23/2018  . Neuropathy involving both lower extremities 09/23/2018  . Screen for colon cancer 05/17/2018  . Vaccine counseling 05/17/2018  . Fatty liver 05/17/2018  . Screening for cervical cancer 05/17/2018  . Hypothyroidism 05/17/2018  . Need for pneumococcal vaccination 05/17/2018  . Generalized abdominal tenderness 05/17/2018  . Need for influenza vaccination 04/13/2018  .  Other fatigue 02/08/2018  . Edema 01/25/2018  . Renal failure (ARF), acute on chronic (HCC) 01/20/2018  . Normocytic normochromic anemia 01/20/2018  . Tremor 01/05/2018  . Vitamin D deficiency 01/05/2018  . RUQ abdominal pain 10/27/2017  . Hemorrhoids 10/27/2017  . High risk medication use 09/25/2017  . Type 2 diabetes mellitus with neurological complications ([place]) [id number]  . Proteinuria 09/02/2017  . Paresthesia 09/02/2017  . IgA nephropathy 06/10/2017  . Language barrier 06/10/2017  . Gastroesophageal reflux disease 06/10/2017  . SOB (shortness of breath) 02/24/2017  . Hyperlipidemia 04/21/2016  . Chronic right-sided low back pain with right-sided sciatica 01/29/2016  . Morbid obesity ([place]) 01/29/2016  . Hypokalemia 01/29/2016  . Essential hypertension 01/29/2016   Past Medical  History:  Diagnosis Date  . Diabetes mellitus without complication ([place])   . Fatty liver 2019   elevated LFTs, negative for Hep B and Hep C 04/2018  . Hypertension   . Hypothyroidism 2019  . IgA nephropathy 2018   Dr. Corliss Parish  . Language barrier   . Obesity   . Proteinuria     Family History  Problem Relation Age of Onset  . Diabetes Mother   . Hypertension Father   . Colon cancer Neg Hx   . Stomach cancer Neg Hx   . Esophageal cancer Neg Hx   . Rectal cancer Neg Hx   . Liver cancer Neg Hx     Past Surgical History:  Procedure Laterality Date  . RENAL BIOPSY  2018   IgA nephropathy, Dr. Corliss Parish   Social History   Occupational History  . Occupation: homemaker  Tobacco Use  . Smoking status: Never Smoker  . Smokeless tobacco: Never Used  Substance and Sexual Activity  . Alcohol use: Never  . Drug use: No  . Sexual activity: Not on file

## 2019-09-30 ENCOUNTER — Other Ambulatory Visit: Payer: Self-pay

## 2019-09-30 ENCOUNTER — Encounter: Payer: Self-pay | Admitting: Physical Therapy

## 2019-09-30 ENCOUNTER — Ambulatory Visit: Payer: Medicaid Other | Admitting: Physical Therapy

## 2019-09-30 DIAGNOSIS — M62830 Muscle spasm of back: Secondary | ICD-10-CM

## 2019-09-30 DIAGNOSIS — G8929 Other chronic pain: Secondary | ICD-10-CM

## 2019-09-30 DIAGNOSIS — M25561 Pain in right knee: Secondary | ICD-10-CM | POA: Diagnosis not present

## 2019-09-30 DIAGNOSIS — R531 Weakness: Secondary | ICD-10-CM

## 2019-09-30 NOTE — Therapy (Signed)
[facility] [place], [name], [address] Phone: [phone]   Fax:  [phone]  Physical Therapy Treatment  Patient Details  Name: Linda Black MRN: [id number] Date of Birth: 1971-01-02 Referring Provider (PT): Dr Basil Dess    Encounter Date: 09/30/2019  PT End of Session - 09/30/19 1054    Visit Number  2    Number of Visits  12    Date for PT Re-Evaluation  11/02/19    Authorization Type  MCD request 3 initial visits    PT Start Time  1015    PT Stop Time  1111    PT Time Calculation (min)  56 min    Activity Tolerance  Patient tolerated treatment well    Behavior During Therapy  [facility] for tasks assessed/performed       Past Medical History:  Diagnosis Date  . Diabetes mellitus without complication ([place])   . Fatty liver 2019   elevated LFTs, negative for Hep B and Hep C 04/2018  . Hypertension   . Hypothyroidism 2019  . IgA nephropathy 2018   Dr. Corliss Parish  . Language barrier   . Obesity   . Proteinuria     Past Surgical History:  Procedure Laterality Date  . RENAL BIOPSY  2018   IgA nephropathy, Dr. Corliss Parish    There were no vitals filed for this visit.  Subjective Assessment - 09/30/19 1022    Subjective  Patient reports that she had injection in the irght knee earlier this week, and that this has helped, still hurting in the back    Currently in Pain?  Yes    Pain Score  5     Pain Location  Back    Aggravating Factors   walking                       [facility] Adult PT Treatment/Exercise - 09/30/19 0001      Lumbar Exercises: Aerobic   Recumbent Bike  5 minutes      Lumbar Exercises: Standing   Row  20 reps;Theraband    Theraband Level (Row)  Level 2 (Red)    Shoulder Extension  20 reps;Theraband    Theraband Level (Shoulder Extension)  Level 2 (Red)    Other Standing Lumbar Exercises  hip abduction and extension standing behind bike      Lumbar Exercises: Supine   Pelvic Tilt  20 reps    Other Supine Lumbar Exercises  feet on ball K2C, trunk rotration, small brides and isometric abs      Modalities   Modalities  Electrical Stimulation;Moist Heat      Moist Heat Therapy   Number Minutes Moist Heat  10 Minutes    Moist Heat Location  Lumbar Spine      Electrical Stimulation   Electrical Stimulation Location  lumbar area    Electrical Stimulation Action  IFC    Electrical Stimulation Parameters  supine    Electrical Stimulation Goals  Pain      Manual Therapy   Manual Therapy  Soft tissue mobilization    Soft tissue mobilization  to the lumbar parapsinals to the SI area with her in sitting                  PT Long Term Goals - 09/30/19 1056      PT LONG TERM GOAL #1   Title  I with HEP  Status  On-going      PT LONG TERM GOAL #2   Title  demo full and painfree lumbar ROM all except extension    Status  On-going            Plan - 09/30/19 1054    Clinical Impression Statement  Patient is very tight in the lumbar parapsinals, she reports that the injection in the knee earlier in the wek helped.  Needs a lot of cues to slow down with exercises and demonstration with tactile cues.  Difficulty engaging the core mms    PT Next Visit Plan  continue to work on core and flexibility    Consulted and Agree with Plan of Care  Patient;Other (Comment)       Patient will benefit from skilled therapeutic intervention in order to improve the following deficits and impairments:  Decreased range of motion, Obesity, Increased muscle spasms, Decreased knowledge of precautions, Pain, Decreased strength  Visit Diagnosis: Chronic pain of right knee  Chronic bilateral low back pain without sciatica  General weakness  Muscle spasm of back     Problem List Patient Active Problem List   Diagnosis Date Noted  . Radicular pain of right lower extremity 04/18/2019  . Other intervertebral disc degeneration,  lumbar region 04/18/2019  . Radiculopathy of lumbosacral region 03/21/2019  . Right leg pain 03/21/2019  . Impaired fasting blood sugar 02/23/2019  . Cold sore 10/18/2018  . Chest pain 09/23/2018  . Neuropathy involving both lower extremities 09/23/2018  . Screen for colon cancer 05/17/2018  . Vaccine counseling 05/17/2018  . Fatty liver 05/17/2018  . Screening for cervical cancer 05/17/2018  . Hypothyroidism 05/17/2018  . Need for pneumococcal vaccination 05/17/2018  . Generalized abdominal tenderness 05/17/2018  . Need for influenza vaccination 04/13/2018  . Other fatigue 02/08/2018  . Edema 01/25/2018  . Renal failure (ARF), acute on chronic (HCC) 01/20/2018  . Normocytic normochromic anemia 01/20/2018  . Tremor 01/05/2018  . Vitamin D deficiency 01/05/2018  . RUQ abdominal pain 10/27/2017  . Hemorrhoids 10/27/2017  . High risk medication use 09/25/2017  . Type 2 diabetes mellitus with neurological complications ([place]) [id number]  . Proteinuria 09/02/2017  . Paresthesia 09/02/2017  . IgA nephropathy 06/10/2017  . Language barrier 06/10/2017  . Gastroesophageal reflux disease 06/10/2017  . SOB (shortness of breath) 02/24/2017  . Hyperlipidemia 04/21/2016  . Chronic right-sided low back pain with right-sided sciatica 01/29/2016  . Morbid obesity ([place]) 01/29/2016  . Hypokalemia 01/29/2016  . Essential hypertension 01/29/2016    Sumner Boast., PT 09/30/2019, 10:57 AM  [address] [place], [name], [address] Phone: [phone]   Fax:  [phone]  Name: Linda Black MRN: [id number] Date of Birth: 1971/01/15

## 2019-10-03 ENCOUNTER — Encounter: Payer: Self-pay | Admitting: Physical Therapy

## 2019-10-03 ENCOUNTER — Other Ambulatory Visit: Payer: Self-pay

## 2019-10-03 ENCOUNTER — Ambulatory Visit: Payer: Medicaid Other | Attending: Specialist | Admitting: Physical Therapy

## 2019-10-03 DIAGNOSIS — M25561 Pain in right knee: Secondary | ICD-10-CM | POA: Insufficient documentation

## 2019-10-03 DIAGNOSIS — R531 Weakness: Secondary | ICD-10-CM | POA: Insufficient documentation

## 2019-10-03 DIAGNOSIS — G8929 Other chronic pain: Secondary | ICD-10-CM | POA: Insufficient documentation

## 2019-10-03 DIAGNOSIS — M545 Low back pain, unspecified: Secondary | ICD-10-CM

## 2019-10-03 DIAGNOSIS — M62830 Muscle spasm of back: Secondary | ICD-10-CM | POA: Insufficient documentation

## 2019-10-03 NOTE — Therapy (Signed)
[facility], [name], [address] Phone: [phone]   Fax:  [phone]  Physical Therapy Treatment  Patient Details  Name: Linda Black MRN: [id number] Date of Birth: 11-04-1970 Referring Provider (PT): Dr Basil Dess    Encounter Date: 10/03/2019  PT End of Session - 10/03/19 1051    Visit Number  3    Number of Visits  12    PT Start Time  [id number]    PT Stop Time  1105    PT Time Calculation (min)  57 min    Activity Tolerance  Patient tolerated treatment well    Behavior During Therapy  [facility] for tasks assessed/performed       Past Medical History:  Diagnosis Date  . Diabetes mellitus without complication ([place])   . Fatty liver 2019   elevated LFTs, negative for Hep B and Hep C 04/2018  . Hypertension   . Hypothyroidism 2019  . IgA nephropathy 2018   Dr. Corliss Parish  . Language barrier   . Obesity   . Proteinuria     Past Surgical History:  Procedure Laterality Date  . RENAL BIOPSY  2018   IgA nephropathy, Dr. Corliss Parish    There were no vitals filed for this visit.  Subjective Assessment - 10/03/19 1010    Subjective  Patient reports that exercises cause pain, I talked to her via interpreter about the need for strength to help stabilize the back    Currently in Pain?  Yes    Pain Score  5     Pain Location  Back    Pain Orientation  Right;Lower    Aggravating Factors   standing and walking                       [facility] Adult PT Treatment/Exercise - 10/03/19 0001      Lumbar Exercises: Aerobic   Nustep  level 3 x 5 minutes      Lumbar Exercises: Standing   Row  20 reps;Theraband    Theraband Level (Row)  Level 2 (Red)    Shoulder Extension  20 reps;Theraband    Theraband Level (Shoulder Extension)  Level 2 (Red)      Lumbar Exercises: Supine   Pelvic Tilt  20 reps    Other Supine Lumbar Exercises  feet on ball K2C, trunk rotration, small  brides and isometric abs      Modalities   Modalities  Electrical Stimulation;Moist Heat      Moist Heat Therapy   Number Minutes Moist Heat  10 Minutes    Moist Heat Location  Lumbar Spine      Electrical Stimulation   Electrical Stimulation Location  lumbar area    Electrical Stimulation Action  IFC    Electrical Stimulation Parameters  Supine    Electrical Stimulation Goals  Pain      Manual Therapy   Manual Therapy  Soft tissue mobilization    Soft tissue mobilization  to the lumbar parapsinals to the SI area with her in sitting                  PT Long Term Goals - 10/03/19 1053      PT LONG TERM GOAL #1   Title  I with HEP    Status  On-going      PT LONG TERM GOAL #2   Title  demo full and painfree  lumbar ROM all except extension    Status  On-going      PT LONG TERM GOAL #3   Title  improve bilat hip extension  4+/5 and core strength to good to support spine    Status  On-going            Plan - 10/03/19 1052    Clinical Impression Statement  Patient very tight spasms in the right lumbar area, has pain increased with hip extension in standing.  Needed a lot of cues for core activation, she has a lot of difficulty engaging the abs    PT Next Visit Plan  will need to do reauthorization for medicaid       Patient will benefit from skilled therapeutic intervention in order to improve the following deficits and impairments:  Decreased range of motion, Obesity, Increased muscle spasms, Decreased knowledge of precautions, Pain, Decreased strength  Visit Diagnosis: Chronic pain of right knee  Chronic bilateral low back pain without sciatica  General weakness  Muscle spasm of back     Problem List Patient Active Problem List   Diagnosis Date Noted  . Radicular pain of right lower extremity 04/18/2019  . Other intervertebral disc degeneration, lumbar region 04/18/2019  . Radiculopathy of lumbosacral region 03/21/2019  . Right leg pain  03/21/2019  . Impaired fasting blood sugar 02/23/2019  . Cold sore 10/18/2018  . Chest pain 09/23/2018  . Neuropathy involving both lower extremities 09/23/2018  . Screen for colon cancer 05/17/2018  . Vaccine counseling 05/17/2018  . Fatty liver 05/17/2018  . Screening for cervical cancer 05/17/2018  . Hypothyroidism 05/17/2018  . Need for pneumococcal vaccination 05/17/2018  . Generalized abdominal tenderness 05/17/2018  . Need for influenza vaccination 04/13/2018  . Other fatigue 02/08/2018  . Edema 01/25/2018  . Renal failure (ARF), acute on chronic (HCC) 01/20/2018  . Normocytic normochromic anemia 01/20/2018  . Tremor 01/05/2018  . Vitamin D deficiency 01/05/2018  . RUQ abdominal pain 10/27/2017  . Hemorrhoids 10/27/2017  . High risk medication use 09/25/2017  . Type 2 diabetes mellitus with neurological complications ([place]) [id number]  . Proteinuria 09/02/2017  . Paresthesia 09/02/2017  . IgA nephropathy 06/10/2017  . Language barrier 06/10/2017  . Gastroesophageal reflux disease 06/10/2017  . SOB (shortness of breath) 02/24/2017  . Hyperlipidemia 04/21/2016  . Chronic right-sided low back pain with right-sided sciatica 01/29/2016  . Morbid obesity ([place]) 01/29/2016  . Hypokalemia 01/29/2016  . Essential hypertension 01/29/2016    Sumner Boast., PT 10/03/2019, 10:54 AM  [address] [address] [place], [name], [address] Phone: [phone]   Fax:  [phone]  Name: Linda Black MRN: [id number] Date of Birth: [birth year]

## 2019-10-06 ENCOUNTER — Encounter: Payer: Medicaid Other | Admitting: Physical Therapy

## 2019-10-10 ENCOUNTER — Other Ambulatory Visit: Payer: Self-pay | Admitting: Medical

## 2019-10-11 ENCOUNTER — Encounter: Payer: Self-pay | Admitting: Physical Therapy

## 2019-10-11 ENCOUNTER — Ambulatory Visit: Payer: Medicaid Other | Admitting: Physical Therapy

## 2019-10-11 ENCOUNTER — Other Ambulatory Visit: Payer: Self-pay

## 2019-10-11 DIAGNOSIS — M62830 Muscle spasm of back: Secondary | ICD-10-CM

## 2019-10-11 DIAGNOSIS — M25561 Pain in right knee: Secondary | ICD-10-CM

## 2019-10-11 DIAGNOSIS — R531 Weakness: Secondary | ICD-10-CM

## 2019-10-11 DIAGNOSIS — G8929 Other chronic pain: Secondary | ICD-10-CM

## 2019-10-11 NOTE — Telephone Encounter (Signed)
[facility] is requesting to fill pt tramadol. Please advise [facility]

## 2019-10-11 NOTE — Therapy (Signed)
[address] [place], [name], [address] Phone: [phone]   Fax:  [phone]  Physical Therapy Treatment  Patient Details  Name: Linda Black MRN: [id number] Date of Birth: May 02, 1971 Referring Provider (PT): Dr Basil Dess    Encounter Date: 10/11/2019  PT End of Session - 10/11/19 1053    Visit Number  4    Number of Visits  4    Authorization Type  MCD request 3 initial visits    PT Start Time  1012    PT Stop Time  1055    PT Time Calculation (min)  43 min    Activity Tolerance  Patient tolerated treatment well    Behavior During Therapy  [facility] for tasks assessed/performed       Past Medical History:  Diagnosis Date  . Diabetes mellitus without complication ([place])   . Fatty liver 2019   elevated LFTs, negative for Hep B and Hep C 04/2018  . Hypertension   . Hypothyroidism 2019  . IgA nephropathy 2018   Dr. Corliss Parish  . Language barrier   . Obesity   . Proteinuria     Past Surgical History:  Procedure Laterality Date  . RENAL BIOPSY  2018   IgA nephropathy, Dr. Corliss Parish    There were no vitals filed for this visit.  Subjective Assessment - 10/11/19 1018    Subjective  Patient reports not much change with pain.  left low back    Currently in Pain?  Yes    Pain Score  5     Pain Location  Back    Pain Orientation  Left;Lower    Pain Relieving Factors  medicine only thing that helps                       [facility] Adult PT Treatment/Exercise - 10/11/19 0001      Lumbar Exercises: Stretches   Passive Hamstring Stretch  Right;Left;3 reps;20 seconds    ITB Stretch  Right;4 reps;20 seconds    Piriformis Stretch  Right;Left;4 reps;20 seconds      Lumbar Exercises: Aerobic   Recumbent Bike  5 minutes    Nustep  level 3 x 5 minutes      Lumbar Exercises: Standing   Row  20 reps;Theraband    Theraband Level (Row)  Level 2 (Red)    Shoulder Extension  20  reps;Theraband    Theraband Level (Shoulder Extension)  Level 2 (Red)    Other Standing Lumbar Exercises  hip abduction and extension standing behind bike    Other Standing Lumbar Exercises  ER for the shoulders      Lumbar Exercises: Supine   Other Supine Lumbar Exercises  feet on ball K2C, trunk rotration, small brides and isometric abs             PT Education - 10/11/19 1050    Education Details  HEP for ITB and piriformis    Person(s) Educated  Patient    Methods  Explanation;Demonstration;Handout    Comprehension  Verbalized understanding          PT Long Term Goals - 10/11/19 1057      PT LONG TERM GOAL #1   Title  I with HEP    Status  Partially Met      PT LONG TERM GOAL #2   Title  demo full and painfree lumbar ROM all except extension  Status  Partially Met      PT LONG TERM GOAL #3   Title  improve bilat hip extension  4+/5 and core strength to good to support spine    Status  On-going      PT LONG TERM GOAL #4   Title  report =/> 75% reduction of overall pain    Status  On-going            Plan - 10/11/19 1054    Clinical Impression Statement  Patient continues to report that she is hurting and the only thing that helps is the pain medication.  She is tight in the hips and I gave her this for her HEP, she is frustrated that she is not improving, seems to hvae the tightness that causes the hip and LBP    PT Next Visit Plan  due to lack of progress I asked her to make an appointment with the MD    Consulted and Agree with Plan of Care  Patient;Other (Comment)       Patient will benefit from skilled therapeutic intervention in order to improve the following deficits and impairments:  Decreased range of motion, Obesity, Increased muscle spasms, Decreased knowledge of precautions, Pain, Decreased strength  Visit Diagnosis: Chronic pain of right knee  Chronic bilateral low back pain without sciatica  General weakness  Muscle spasm of  back     Problem List Patient Active Problem List   Diagnosis Date Noted  . Radicular pain of right lower extremity 04/18/2019  . Other intervertebral disc degeneration, lumbar region 04/18/2019  . Radiculopathy of lumbosacral region 03/21/2019  . Right leg pain 03/21/2019  . Impaired fasting blood sugar 02/23/2019  . Cold sore 10/18/2018  . Chest pain 09/23/2018  . Neuropathy involving both lower extremities 09/23/2018  . Screen for colon cancer 05/17/2018  . Vaccine counseling 05/17/2018  . Fatty liver 05/17/2018  . Screening for cervical cancer 05/17/2018  . Hypothyroidism 05/17/2018  . Need for pneumococcal vaccination 05/17/2018  . Generalized abdominal tenderness 05/17/2018  . Need for influenza vaccination 04/13/2018  . Other fatigue 02/08/2018  . Edema 01/25/2018  . Renal failure (ARF), acute on chronic (HCC) 01/20/2018  . Normocytic normochromic anemia 01/20/2018  . Tremor 01/05/2018  . Vitamin D deficiency 01/05/2018  . RUQ abdominal pain 10/27/2017  . Hemorrhoids 10/27/2017  . High risk medication use 09/25/2017  . Type 2 diabetes mellitus with neurological complications ([place]) 91/63/8466  . Proteinuria 09/02/2017  . Paresthesia 09/02/2017  . IgA nephropathy 06/10/2017  . Language barrier 06/10/2017  . Gastroesophageal reflux disease 06/10/2017  . SOB (shortness of breath) 02/24/2017  . Hyperlipidemia 04/21/2016  . Chronic right-sided low back pain with right-sided sciatica 01/29/2016  . Morbid obesity ([place]) 01/29/2016  . Hypokalemia 01/29/2016  . Essential hypertension 01/29/2016    Sumner Boast., PT 10/11/2019, 10:58 AM  [address] [place], [name], [address] Phone: [phone]   Fax:  [phone]  Name: Linda Black MRN: [id number] Date of Birth: 08-19-1970

## 2019-10-12 NOTE — Telephone Encounter (Signed)
Please call her daughter  [name] [phone]  I got a refill request for the tramadol.    I have referred to orthopedics for her pains.  At this point they should be managing and treating her pain.  Ask them what orthopedics recommended for pain?

## 2019-10-12 NOTE — Telephone Encounter (Signed)
Daughter stated she was told she could take her tramadol. Daughter wants to know can you fill tramadol this time and they get ortho to fill next time.

## 2019-10-14 IMAGING — CR RIGHT KNEE - COMPLETE 4+ VIEW
4 series · 4 of 4 positions shown · non-contrast
Comparison: None.

CLINICAL DATA: Right knee pain and swelling 1 week.  No injury.

EXAM:
RIGHT KNEE - COMPLETE 4+ VIEW

[t knee ap right]
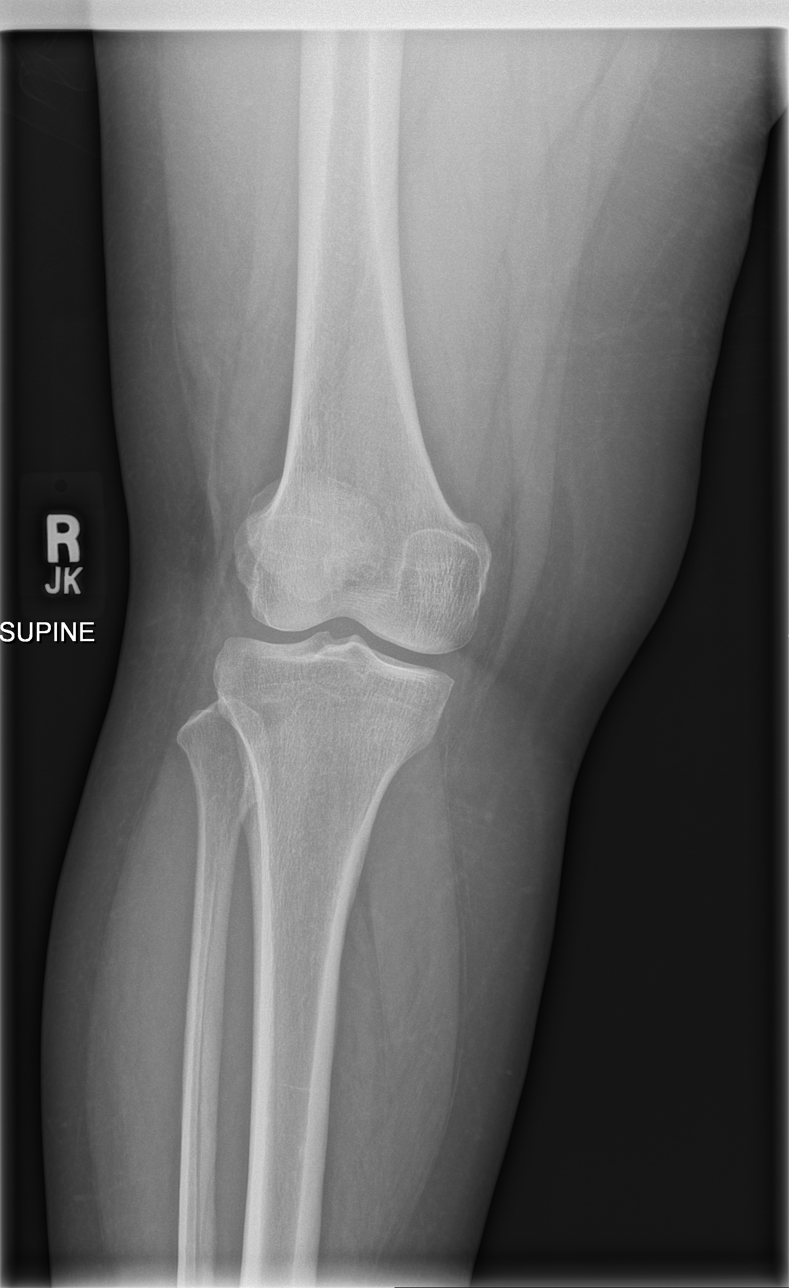

[t knee obl right (1 of 2)]
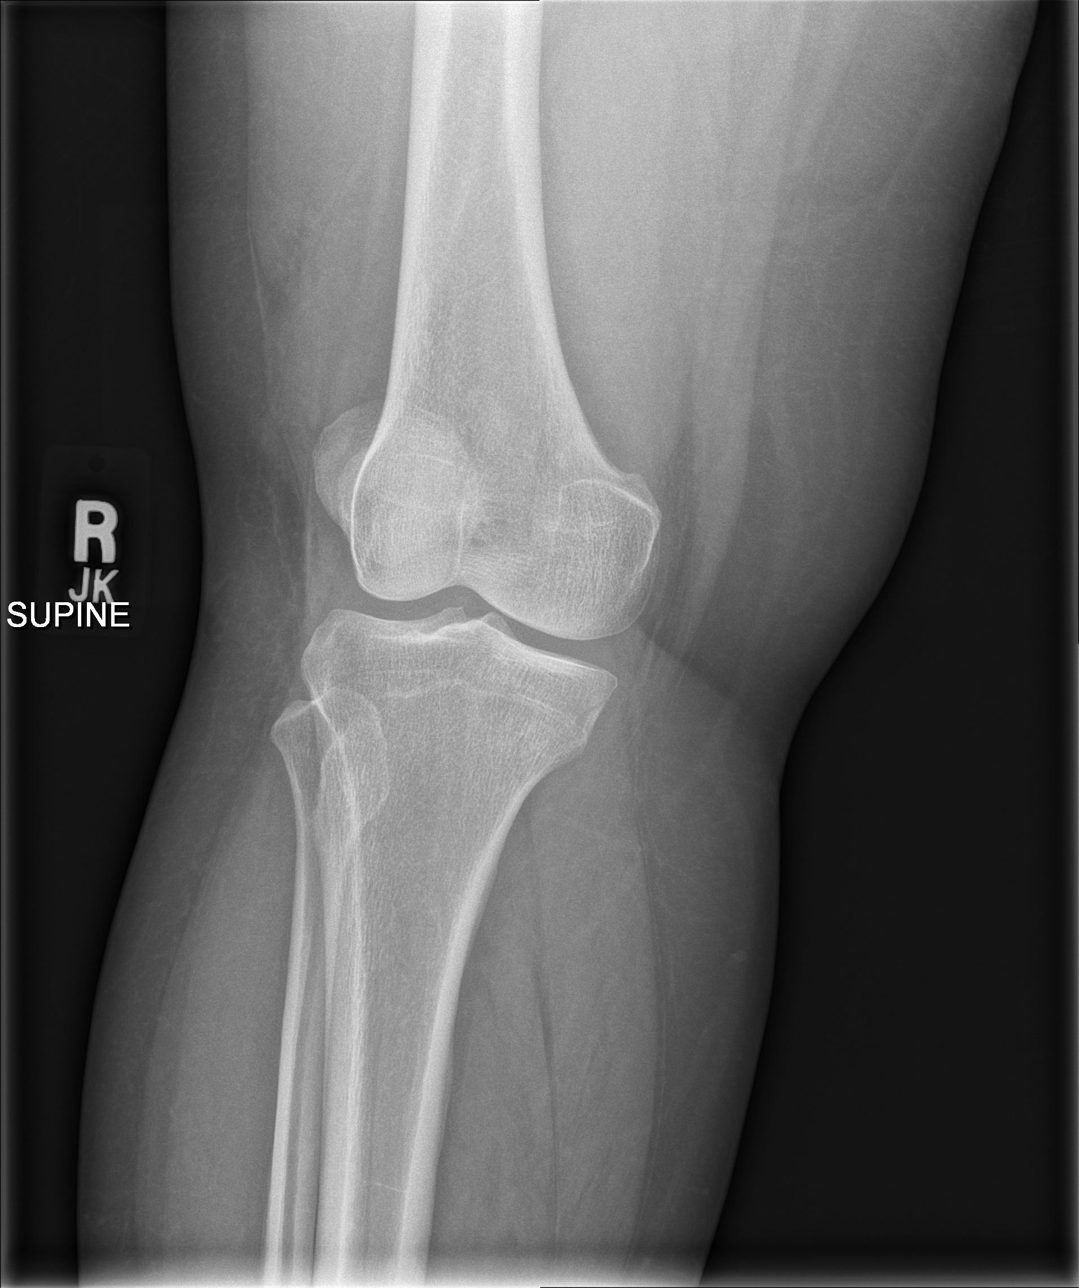

[t knee obl right (2 of 2)]
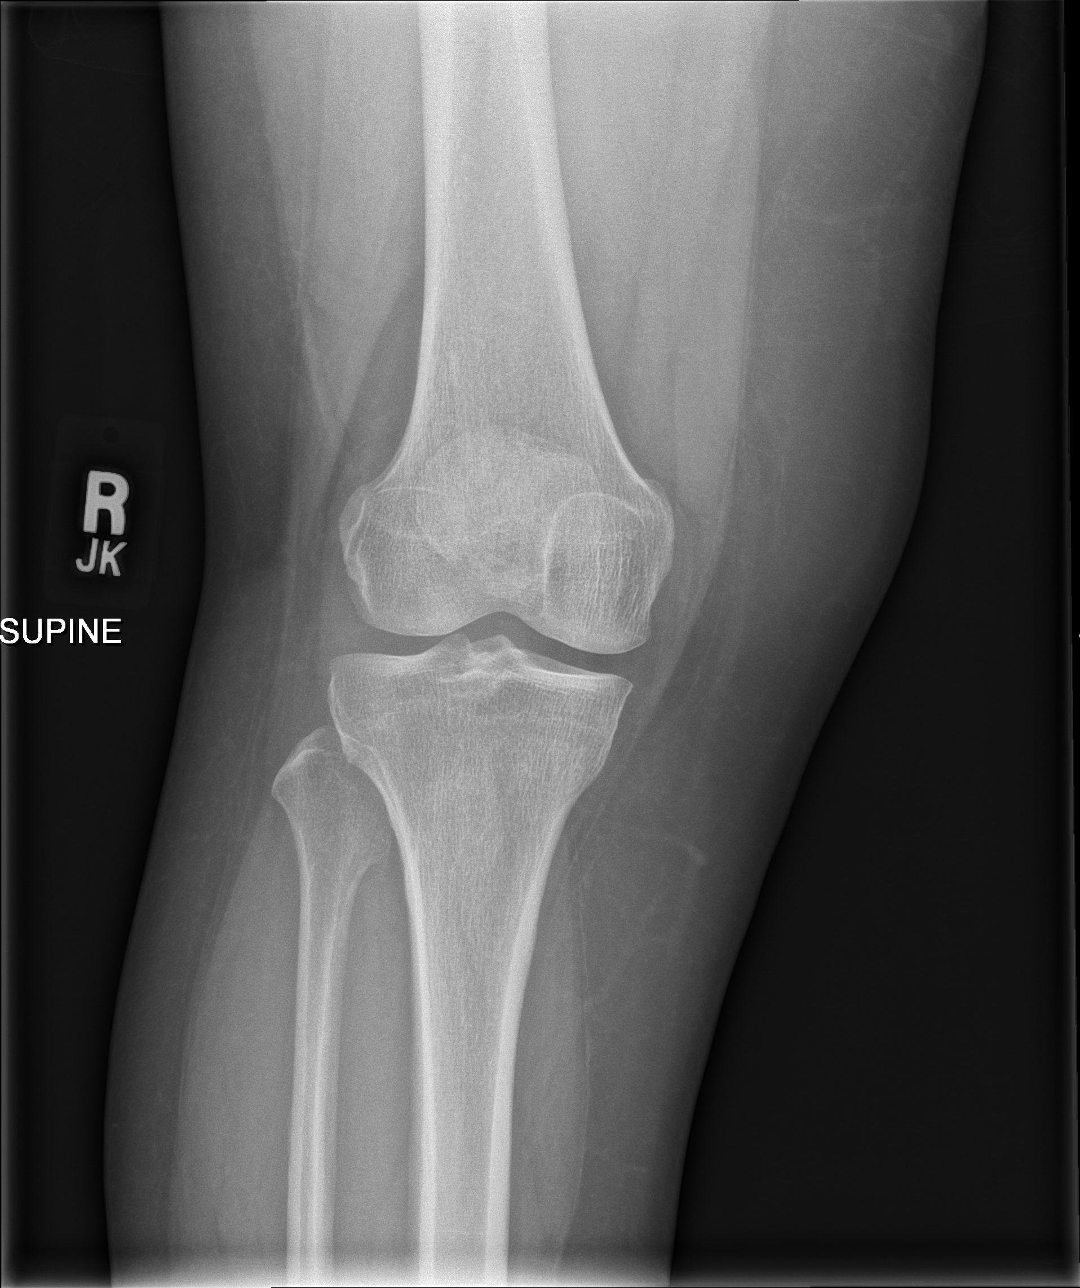

[t knee lat right]
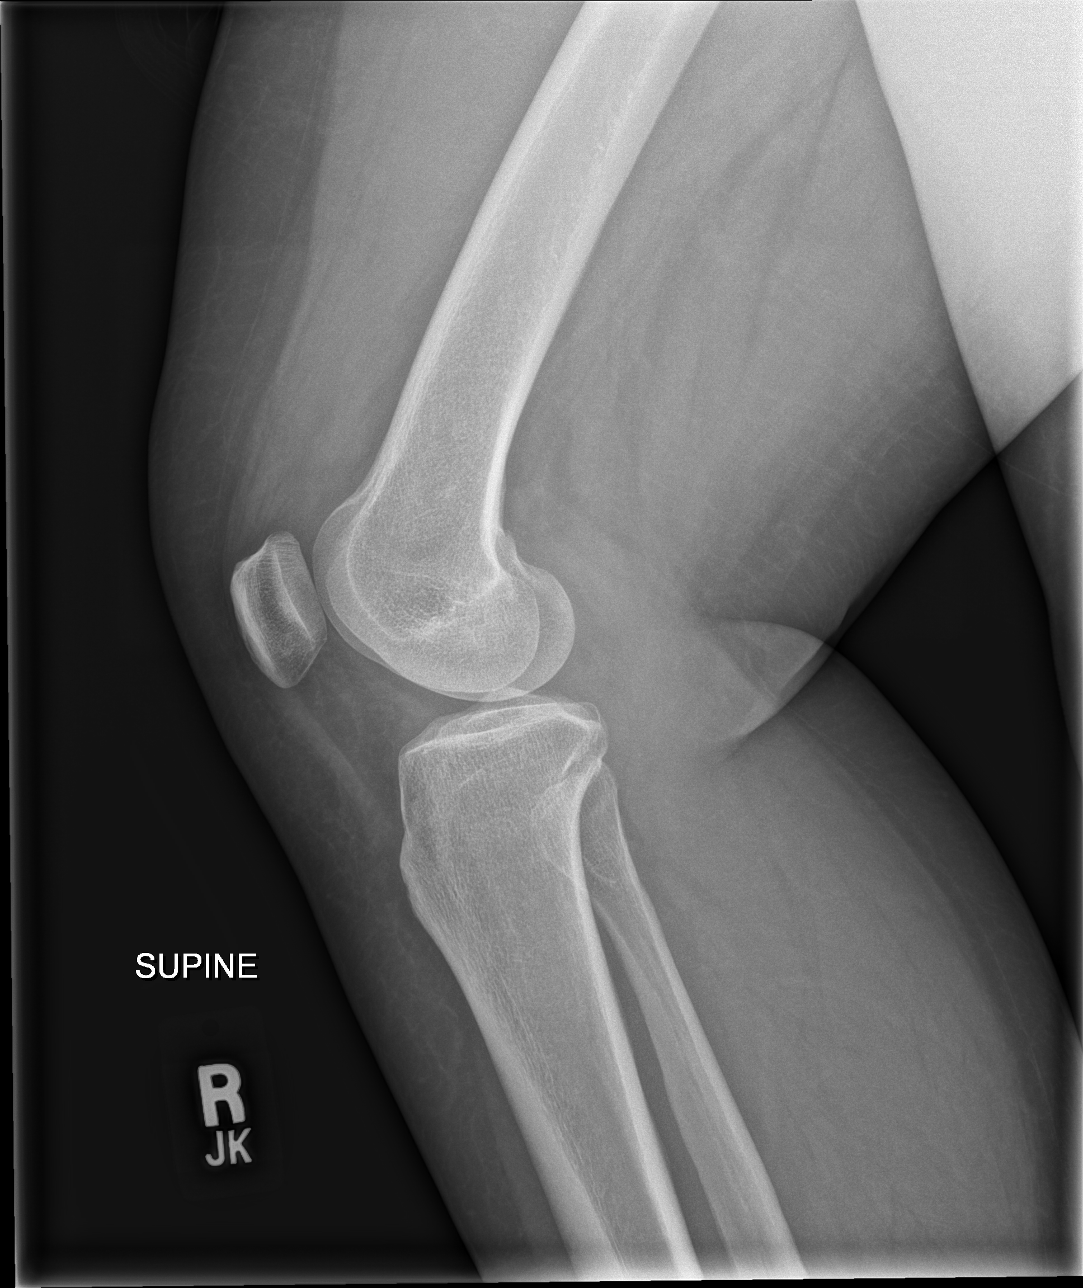

[4 of 4 positions shown; findings below may reference images not displayed]

FINDINGS: No evidence of fracture, dislocation, or joint effusion. No evidence
of arthropathy or other focal bone abnormality. Soft tissues are
unremarkable.
IMPRESSION: Negative.

## 2019-10-19 ENCOUNTER — Ambulatory Visit (INDEPENDENT_AMBULATORY_CARE_PROVIDER_SITE_OTHER): Payer: Medicaid Other | Admitting: Physical Medicine and Rehabilitation

## 2019-10-19 ENCOUNTER — Other Ambulatory Visit: Payer: Self-pay

## 2019-10-19 ENCOUNTER — Ambulatory Visit: Payer: Self-pay

## 2019-10-19 ENCOUNTER — Encounter: Payer: Self-pay | Admitting: Physical Medicine and Rehabilitation

## 2019-10-19 VITALS — BP 174/94 | HR 71

## 2019-10-19 DIAGNOSIS — M5416 Radiculopathy, lumbar region: Secondary | ICD-10-CM

## 2019-10-19 DIAGNOSIS — M48062 Spinal stenosis, lumbar region with neurogenic claudication: Secondary | ICD-10-CM

## 2019-10-19 MED ORDER — METHYLPREDNISOLONE ACETATE 80 MG/ML IJ SUSP
40.0000 mg | Freq: Once | INTRAMUSCULAR | Status: AC
  Administered 2019-10-19: 40 mg

## 2019-10-19 NOTE — Progress Notes (Signed)
  Numeric Pain Rating Scale and Functional Assessment Average Pain (5)   In the last MONTH (on 0-10 scale) has pain interfered with the following?  1. General activity like being  able to carry out your everyday physical activities such as walking, climbing stairs, carrying groceries, or moving a chair?  Rating(5)   +Driver, -BT, -Dye Allergies.  

## 2019-10-27 ENCOUNTER — Ambulatory Visit: Payer: Medicaid Other | Attending: Internal Medicine

## 2019-10-27 DIAGNOSIS — Z23 Encounter for immunization: Secondary | ICD-10-CM

## 2019-10-27 NOTE — Progress Notes (Signed)
   Covid-19 Vaccination Clinic  Name:  Linda Black    MRN: [id number] DOB: 05-18-1971  10/27/2019  [name] was observed post Covid-19 immunization for 15 minutes without incident. She was provided with Vaccine Information Sheet and instruction to access the V-Safe system.   [name] was instructed to call 911 with any severe reactions post vaccine: Marland Kitchen Difficulty breathing  . Swelling of face and throat  . A fast heartbeat  . A bad rash all over body  . Dizziness and weakness   Immunizations Administered    Name Date Dose VIS Date Route   Pfizer COVID-19 Vaccine 10/27/2019  3:59 PM 0.3 mL 07/15/2019 Intramuscular   Manufacturer: [place]   Lot: [id number]   [place]: [id number]

## 2019-10-30 ENCOUNTER — Emergency Department (HOSPITAL_COMMUNITY)
Admission: EM | Admit: 2019-10-30 | Discharge: 2019-10-30 | Disposition: A | Discharge status: Home / Self Care | Payer: Medicaid Other | Attending: Emergency Medicine | Admitting: Emergency Medicine

## 2019-10-30 ENCOUNTER — Encounter (HOSPITAL_COMMUNITY): Payer: Self-pay | Admitting: Emergency Medicine

## 2019-10-30 DIAGNOSIS — E119 Type 2 diabetes mellitus without complications: Secondary | ICD-10-CM | POA: Diagnosis not present

## 2019-10-30 DIAGNOSIS — E039 Hypothyroidism, unspecified: Secondary | ICD-10-CM | POA: Insufficient documentation

## 2019-10-30 DIAGNOSIS — I10 Essential (primary) hypertension: Secondary | ICD-10-CM | POA: Insufficient documentation

## 2019-10-30 DIAGNOSIS — R21 Rash and other nonspecific skin eruption: Secondary | ICD-10-CM | POA: Diagnosis present

## 2019-10-30 DIAGNOSIS — B029 Zoster without complications: Secondary | ICD-10-CM | POA: Diagnosis not present

## 2019-10-30 MED ORDER — VALACYCLOVIR HCL 1 G PO TABS: 1000.0000 mg | ORAL_TABLET | Freq: Three times a day (TID) | ORAL | 0 refills | Status: AC

## 2019-10-30 NOTE — ED Triage Notes (Signed)
Patient complaining of rash on neck and face swelling on the right side. This has been since Friday.

## 2019-10-30 NOTE — ED Provider Notes (Addendum)
[facility] Emergency Department Provider Note MRN:  [id number]  Arrival date & time: 10/30/19     Chief Complaint   Rash History of Present Illness   Linda Black is a 49 y.o. year-old female with a history of diabetes, hypertension presenting to the ED with chief complaint of rash.  Rash to right side of face, present for 2 days.  Painful, mildly itchy.  Denies vomiting, no diarrhea, no shortness of breath, no other complaints.  Review of Systems  A complete 10 system review of systems was obtained and all systems are negative except as noted in the HPI and PMH.   Patient's Health History    Past Medical History:  Diagnosis Date  . Diabetes mellitus without complication ([place])   . Fatty liver 2019   elevated LFTs, negative for Hep B and Hep C 04/2018  . Hypertension   . Hypothyroidism 2019  . IgA nephropathy 2018   Dr. Corliss Parish  . Language barrier   . Obesity   . Proteinuria     Past Surgical History:  Procedure Laterality Date  . RENAL BIOPSY  2018   IgA nephropathy, Dr. Corliss Parish    Family History  Problem Relation Age of Onset  . Diabetes Mother   . Hypertension Father   . Colon cancer Neg Hx   . Stomach cancer Neg Hx   . Esophageal cancer Neg Hx   . Rectal cancer Neg Hx   . Liver cancer Neg Hx     Social History   Socioeconomic History  . Marital status: Divorced    Spouse name: Not on file  . Number of children: 5  . Years of education: Not on file  . Highest education level: Not on file  Occupational History  . Occupation: homemaker  Tobacco Use  . Smoking status: Never Smoker  . Smokeless tobacco: Never Used  Substance and Sexual Activity  . Alcohol use: Never  . Drug use: No  . Sexual activity: Not on file  Other Topics Concern  . Not on file  Social History Narrative  . Not on file   Social Determinants of Health   Financial Resource Strain:   . Difficulty of Paying Living Expenses:   Food  Insecurity:   . Worried About Charity fundraiser in the Last Year:   . Arboriculturist in the Last Year:   Transportation Needs:   . Film/video editor (Medical):   Marland Kitchen Lack of Transportation (Non-Medical):   Physical Activity:   . Days of Exercise per Week:   . Minutes of Exercise per Session:   Stress:   . Feeling of Stress :   Social Connections:   . Frequency of Communication with Friends and Family:   . Frequency of Social Gatherings with Friends and Family:   . Attends Religious Services:   . Active Member of Clubs or Organizations:   . Attends Archivist Meetings:   Marland Kitchen Marital Status:   Intimate Partner Violence:   . Fear of Current or Ex-Partner:   . Emotionally Abused:   Marland Kitchen Physically Abused:   . Sexually Abused:      Physical Exam   Vitals:   10/30/19 2026  BP: (!) 159/97  Pulse: 74  Resp: 17  Temp: 98.4 F (36.9 C)  SpO2: 99%    CONSTITUTIONAL: Well-appearing, NAD NEURO:  Alert and oriented x 3, no focal deficits EYES:  eyes equal and reactive ENT/NECK:  no  LAD, no JVD CARDIO: Regular rate, well-perfused, normal S1 and S2 PULM:  CTAB no wheezing or rhonchi GI/GU:  normal bowel sounds, non-distended, non-tender MSK/SPINE:  No gross deformities, no edema SKIN: Papular erythematous rash in a dermatomal distribution to the right side of the face involving the area inferior to the ear extending to the submandibular region [facility]:  Appropriate speech and behavior  *Additional and/or pertinent findings included in MDM below  Diagnostic and Interventional Summary    EKG Interpretation  Date/Time:    Ventricular Rate:    PR Interval:    QRS Duration:   QT Interval:    QTC Calculation:   R Axis:     Text Interpretation:        Labs Reviewed - No data to display  No orders to display    Medications - No data to display   Procedures  /  Critical Care Procedures  ED Course and Medical Decision Making  I have reviewed the triage vital  signs, the nursing notes, and pertinent available records from the EMR.  Pertinent labs & imaging results that were available during my care of the patient were reviewed by me and considered in my medical decision making (see below for details).     Rash seems most consistent with shingles, no involvement of the eyes or TMs.  Seems to follow the C2 or C3 dermatomal distribution.  Began 2 days ago, appropriate for treatment with Valtrex.  Pain seems well controlled, advised Tylenol.  Patient received the first Covid vaccine the day before the symptoms began and so she was suspicious for allergic reaction or side effect.  No signs of allergic reaction, no rash to the injection site.    Barth Kirks. [place] Small, [place] [email or web address]  Final Clinical Impressions(s) / ED Diagnoses     ICD-10-CM   1. Herpes zoster without complication  P32.9     ED Discharge Orders         Ordered    valACYclovir (VALTREX) 1000 MG tablet  3 times daily     10/30/19 2141           Discharge Instructions Discussed with and Provided to Patient:     Discharge Instructions     You were evaluated in the Emergency Department and after careful evaluation, we did not find any emergent condition requiring admission or further testing in the hospital.  Your exam/testing today is overall reassuring.  Your symptoms seem to be due to shingles.  Please take the medication as directed and use Tylenol for pain.  Please return to the Emergency Department if you experience any worsening of your condition.  We encourage you to follow up with a primary care provider.  Thank you for allowing [place] to be a part of your care.      Maudie Flakes, MD 10/30/19 2146    Maudie Flakes, MD 10/30/19 2146

## 2019-10-30 NOTE — Discharge Instructions (Signed)
You were evaluated in the Emergency Department and after careful evaluation, we did not find any emergent condition requiring admission or further testing in the hospital.  Your exam/testing today is overall reassuring.  Your symptoms seem to be due to shingles.  Please take the medication as directed and use Tylenol for pain.  Please return to the Emergency Department if you experience any worsening of your condition.  We encourage you to follow up with a primary care provider.  Thank you for allowing [place] to be a part of your care.

## 2019-10-31 ENCOUNTER — Encounter: Payer: Self-pay | Admitting: Family Medicine

## 2019-10-31 ENCOUNTER — Other Ambulatory Visit: Payer: Self-pay

## 2019-10-31 ENCOUNTER — Encounter: Payer: Self-pay | Admitting: Medical

## 2019-10-31 ENCOUNTER — Ambulatory Visit: Payer: Medicaid Other | Admitting: Family Medicine

## 2019-10-31 VITALS — BP 147/93 | Ht 61.0 in | Wt 194.0 lb

## 2019-10-31 DIAGNOSIS — B029 Zoster without complications: Secondary | ICD-10-CM | POA: Diagnosis not present

## 2019-10-31 NOTE — Progress Notes (Signed)
Start time: 11:38 End time: 11:52  Virtual Visit via Video Note  I connected with Linda Black on 10/31/19 at 11:15 AM EDT by a video enabled telemedicine application and verified that I am speaking with the correct person using two identifiers.  Location: Patient: home (along with daughter and family) Provider: office   I discussed the limitations of evaluation and management by telemedicine and the availability of in person appointments. The patient expressed understanding and agreed to proceed.  History of Present Illness:  Chief Complaint  Patient presents with  . Rash    VIRTUAL had COVID vaccine 10/27/19-Fri am redness started between neck and right ear. Called 911 past night and they took her to ER-they said it is a reaction but NOT to vaccine and they did not treat her, told her to f/u up with PCP. (looks like given Valtrex per med list but she said pharmacy doesn't have). Blood sugar right now @ 10:45 -159.   Spoke with patient's daughter--Linda Black, who was translating, and holding phone to show mother's rash on the video.  ER note reviewed--rash to R side of face x 2d, painful, sl itchy. Dx'd with shingles in C2 or C3 dermatomal distribution. Takes tramadol (for back) and tylenol, and pain is controlled. Valtrex prescription was [facility] to [facility].  Daughter states she called this morning and they said they didn't have it (confirmed receipt through epic). Patient also has rx tramadol 3/10 #30 by Audelia Acton for back, as well as gabapentin at night.    PMH, PSH, SH reviewed  Outpatient Encounter Medications as of 10/31/2019  Medication Sig Note  . ACCU-CHEK AVIVA PLUS test strip TEST TWICE A DAY   . acetaminophen (TYLENOL) 325 MG tablet Take 650 mg by mouth every 6 (six) hours as needed. 10/31/2019: Last dose 8:45am  . amLODipine (NORVASC) 10 MG tablet Take 1 tablet (10 mg total) by mouth daily.   . furosemide (LASIX) 40 MG tablet TAKE 2 TABLETS BY MOUTH EVERY  MORNING THEN TAKE 1 TABLET BY MOUTH EVERY EVENING   . gabapentin (NEURONTIN) 100 MG capsule Take 1 capsule (100 mg total) by mouth at bedtime.   . Insulin Pen Needle 32G X 4 MM MISC 1 each by Does not apply route at bedtime.   Marland Kitchen levothyroxine (SYNTHROID) 25 MCG tablet TAKE 1 TABLET(25 MCG) BY MOUTH DAILY BEFORE BREAKFAST   . losartan (COZAAR) 50 MG tablet Take 1 tablet (50 mg total) by mouth daily.   . metoprolol succinate (TOPROL-XL) 25 MG 24 hr tablet TAKE 1 TABLET(25 MG) BY MOUTH DAILY. DISCARD VALSARTAN ORDER   . pantoprazole (PROTONIX) 40 MG tablet Take 1 tablet (40 mg total) by mouth 2 (two) times daily.   . potassium chloride SA (KLOR-CON) 20 MEQ tablet TAKE 1 TABLET(20 MEQ) BY MOUTH DAILY   . rosuvastatin (CRESTOR) 40 MG tablet TAKE 1 TABLET(40 MG) BY MOUTH DAILY   . traMADol (ULTRAM) 50 MG tablet TAKE 1 TABLET BY MOUTH EVERY 8 HOURS AS NEEDED FOR UP TO 5 DAYS AS NEEDED   . EPINEPHrine 0.3 mg/0.3 mL IJ SOAJ injection Only use during anaphylaxis (Patient not taking: Reported on 10/31/2019) 04/18/2019: Prn   . valACYclovir (VALTREX) 1000 MG tablet Take 1 tablet (1,000 mg total) by mouth 3 (three) times daily for 7 days. (Patient not taking: Reported on 10/31/2019)   . [DISCONTINUED] diphenhydrAMINE (BENADRYL) 25 mg capsule Take 1 capsule (25 mg total) by mouth every 12 (twelve) hours for 5 days.    No  facility-administered encounter medications on file as of 10/31/2019.   Allergies  Allergen Reactions  . Lisinopril Swelling    Angioedema    ROS: no fever, chills, rash elsewhere on body. No other complaints.    Observations/Objective:  BP (!) 147/93   Ht 5\' 1"  (1.549 m)   Wt 194 lb (88 kg)   LMP 03/23/2017   BMI 36.66 kg/m   Well-appearing, pleasant, obese female, who appears comfortable, in no distress. Skin: Behind the right ear, on the ear (pinna), and along the jaw (mandibular/sub-mandibular) there is erythema and papular vs vescular lesions.  Left side of face and elsewhere  is free of rash.   Assessment and Plan:  Herpes zoster without complication - R ear/jaw; advised to start Vatrex 1 gm TID ASAP--will ensure med is at pharmacy. Pain currently controlled. f/u prn  Advised to f/u if symptoms change/worsen. Advised to get Shingrix at age 49.  Pain is currently well controlled. If worsens, we can titrate up gabapentin.  All questions answered (from daughter, whose English is very good).   Follow Up Instructions:    I discussed the assessment and treatment plan with the patient. The patient was provided an opportunity to ask questions and all were answered. The patient agreed with the plan and demonstrated an understanding of the instructions.   The patient was advised to call back or seek an in-person evaluation if the symptoms worsen or if the condition fails to improve as anticipated.  I provided 14 minutes of non-face-to-face time during this encounter.   [name], MD

## 2019-11-08 NOTE — Progress Notes (Signed)
Linda Black - 49 y.o. female MRN [id number]  Date of birth: Mar 08, 1971  Office Visit Note: Visit Date: 10/19/2019 PCP: Carlena Hurl, PA-C Referred by: Carlena Hurl, PA-C  Subjective: Chief Complaint  Patient presents with  . Lower Back - Pain  . Right Thigh - Pain   HPI:  FRANCENE MCERLEAN is a 49 y.o. female who comes in today For planned interlaminar dural steroid injection at L4-5 as requested by Dr. Basil Dess.  Patient with right radicular leg pain.  Failure of conservative care otherwise.  MRI with moderate multifactorial stenosis at L4-5 with disc herniation.  ROS Otherwise per HPI.  Assessment & Plan: Visit Diagnoses:  1. Lumbar radiculopathy   2. Spinal stenosis of lumbar region with neurogenic claudication     Plan: No additional findings.   Meds & Orders:  Meds ordered this encounter  Medications  . methylPREDNISolone acetate (DEPO-MEDROL) injection 40 mg    Orders Placed This Encounter  Procedures  . XR C-ARM NO REPORT  . Epidural Steroid injection    Follow-up: Return for Basil Dess, MD as scheduled.   Procedures: No procedures performed  Lumbar Epidural Steroid Injection - Interlaminar Approach with Fluoroscopic Guidance  Patient: DAWSYN ZURN      Date of Birth: Dec 15, 1970 MRN: [id number] PCP: Carlena Hurl, PA-C      Visit Date: 10/19/2019   Universal Protocol:     Consent Given By: the patient  Position: PRONE  Additional Comments: Vital signs were monitored before and after the procedure. Patient was prepped and draped in the usual sterile fashion. The correct patient, procedure, and site was verified.   Injection Procedure Details:  Procedure Site One Meds Administered:  Meds ordered this encounter  Medications  . methylPREDNISolone acetate (DEPO-MEDROL) injection 40 mg     Laterality: Right  Location/Site:  L4-L5  Needle size: 20 G  Needle type: Tuohy  Needle Placement: Paramedian epidural  Findings:    -Comments: Excellent flow of contrast into the epidural space.  Procedure Details: Using a paramedian approach from the side mentioned above, the region overlying the inferior lamina was localized under fluoroscopic visualization and the soft tissues overlying this structure were infiltrated with 4 ml. of 1% Lidocaine without Epinephrine. The Tuohy needle was inserted into the epidural space using a paramedian approach.   The epidural space was localized using loss of resistance along with lateral and bi-planar fluoroscopic views.  After negative aspirate for air, blood, and CSF, a 2 ml. volume of Isovue-250 was injected into the epidural space and the flow of contrast was observed. Radiographs were obtained for documentation purposes.    The injectate was administered into the level noted above.   Additional Comments:  The patient tolerated the procedure well Dressing: 2 x 2 sterile gauze and Band-Aid    Post-procedure details: Patient was observed during the procedure. Post-procedure instructions were reviewed.  Patient left the clinic in stable condition.    Clinical History: MRI LUMBAR SPINE WITHOUT CONTRAST  TECHNIQUE: Multiplanar, multisequence MR imaging of the lumbar spine was performed. No intravenous contrast was administered.  COMPARISON:  None.  FINDINGS: Segmentation:  Standard.  Alignment:  No significant listhesis.  Vertebrae: Vertebral body heights are maintained. There is no significant marrow edema or suspicious osseous lesion identified  Conus medullaris and cauda equina: Conus extends to the L1 level. Conus and cauda equina appear normal.  Paraspinal and other soft tissues: Unremarkable.  Disc levels:  L1-L2:  No significant  canal or foraminal stenosis.  L2-L3:  No significant canal or foraminal stenosis.  L3-L4: Disc bulge and facet arthropathy with ligamentum flavum infolding. Mild canal stenosis. No significant foraminal  stenosis.  L4-L5: Disc bulge with superimposed central disc protrusion and annular fissure. Endplate osteophytic ridging and facet arthropathy with ligamentum flavum infolding. Moderate canal stenosis with narrowing of lateral recesses, left greater than right. Mild to moderate foraminal stenosis, left greater than right.  L5-S1: Disc bulge with right subarticular annular fissure and facet arthropathy with ligamentum flavum infolding. No significant canal stenosis with partial effacement of the right lateral recess. Minor foraminal stenosis.  IMPRESSION: Multilevel degenerative changes as detailed above, greatest at L4-L5 and L5-S1.   Electronically Signed   By: Macy Mis M.D.   On: 08/24/2019 09:25     Objective:  VS:  HT:    WT:   BMI:     BP:(!) 174/94  HR:71bpm  TEMP: ( )  RESP:  Physical Exam  Ortho Exam Imaging: No results found.

## 2019-11-08 NOTE — Procedures (Signed)
Lumbar Epidural Steroid Injection - Interlaminar Approach with Fluoroscopic Guidance  Patient: Linda Black      Date of Birth: 12/09/1970 MRN: [id number] PCP: Carlena Hurl, PA-C      Visit Date: 10/19/2019   Universal Protocol:     Consent Given By: the patient  Position: PRONE  Additional Comments: Vital signs were monitored before and after the procedure. Patient was prepped and draped in the usual sterile fashion. The correct patient, procedure, and site was verified.   Injection Procedure Details:  Procedure Site One Meds Administered:  Meds ordered this encounter  Medications  . methylPREDNISolone acetate (DEPO-MEDROL) injection 40 mg     Laterality: Right  Location/Site:  L4-L5  Needle size: 20 G  Needle type: Tuohy  Needle Placement: Paramedian epidural  Findings:   -Comments: Excellent flow of contrast into the epidural space.  Procedure Details: Using a paramedian approach from the side mentioned above, the region overlying the inferior lamina was localized under fluoroscopic visualization and the soft tissues overlying this structure were infiltrated with 4 ml. of 1% Lidocaine without Epinephrine. The Tuohy needle was inserted into the epidural space using a paramedian approach.   The epidural space was localized using loss of resistance along with lateral and bi-planar fluoroscopic views.  After negative aspirate for air, blood, and CSF, a 2 ml. volume of Isovue-250 was injected into the epidural space and the flow of contrast was observed. Radiographs were obtained for documentation purposes.    The injectate was administered into the level noted above.   Additional Comments:  The patient tolerated the procedure well Dressing: 2 x 2 sterile gauze and Band-Aid    Post-procedure details: Patient was observed during the procedure. Post-procedure instructions were reviewed.  Patient left the clinic in stable condition.

## 2019-11-10 ENCOUNTER — Encounter: Payer: Self-pay | Admitting: Specialist

## 2019-11-10 ENCOUNTER — Other Ambulatory Visit: Payer: Self-pay

## 2019-11-10 ENCOUNTER — Ambulatory Visit (INDEPENDENT_AMBULATORY_CARE_PROVIDER_SITE_OTHER): Payer: Medicaid Other | Admitting: Specialist

## 2019-11-10 VITALS — BP 163/67 | HR 66 | Ht 61.0 in | Wt 191.0 lb

## 2019-11-10 DIAGNOSIS — M1711 Unilateral primary osteoarthritis, right knee: Secondary | ICD-10-CM | POA: Diagnosis not present

## 2019-11-10 DIAGNOSIS — M48062 Spinal stenosis, lumbar region with neurogenic claudication: Secondary | ICD-10-CM

## 2019-11-10 DIAGNOSIS — M5442 Lumbago with sciatica, left side: Secondary | ICD-10-CM | POA: Diagnosis not present

## 2019-11-10 DIAGNOSIS — M4316 Spondylolisthesis, lumbar region: Secondary | ICD-10-CM

## 2019-11-10 DIAGNOSIS — G8929 Other chronic pain: Secondary | ICD-10-CM

## 2019-11-10 DIAGNOSIS — M5441 Lumbago with sciatica, right side: Secondary | ICD-10-CM

## 2019-11-10 MED ORDER — TRAMADOL HCL 50 MG PO TABS: 100.0000 mg | ORAL_TABLET | Freq: Four times a day (QID) | ORAL | 0 refills | Status: DC | PRN

## 2019-11-10 MED ORDER — BUPIVACAINE HCL 0.5 % IJ SOLN
3.0000 mL | INTRAMUSCULAR | Status: AC | PRN
  Administered 2019-11-10: 11:00:00 3 mL via INTRA_ARTICULAR

## 2019-11-10 MED ORDER — METHYLPREDNISOLONE ACETATE 40 MG/ML IJ SUSP
40.0000 mg | INTRAMUSCULAR | Status: AC | PRN
  Administered 2019-11-10: 40 mg via INTRA_ARTICULAR

## 2019-11-10 NOTE — Patient Instructions (Signed)
Plan: The main ways of treat osteoarthritis, that are found to be success. Weight loss helps to decrease pain. Exercise is important to maintaining cartilage and thickness and strengthening. NSAIDs like motrin, tylenol, alleve are meds decreasing the inflamation. Ice is okay  In afternoon and evening and hot shower in the am Avoid bending, stooping and avoid lifting weights greater than 10 lbs. Avoid prolong standing and walking. Avoid frequent bending and stooping  No lifting greater than 10 lbs. May use ice or moist heat for pain. Weight loss is of benefit. Handicap license is approved. Dr. Romona Curls secretary/Assistant will call to arrange for epidural steroid injection

## 2019-11-10 NOTE — Progress Notes (Signed)
Office Visit Note   Patient: Linda Black           Date of Birth: Mar 18, 1971           MRN: [id number] Visit Date: 11/10/2019              Requested by: Carlena Hurl, PA-C [address] [place],  [place] [address] PCP: Carlena Hurl, PA-C   Assessment & Plan: Visit Diagnoses:  1. Spinal stenosis of lumbar region with neurogenic claudication   2. Spondylolisthesis of lumbar region   3. Unilateral primary osteoarthritis, right knee   4. Chronic bilateral low back pain with bilateral sciatica     Plan: The main ways of treat osteoarthritis, that are found to be success. Weight loss helps to decrease pain. Exercise is important to maintaining cartilage and thickness and strengthening. NSAIDs like motrin, tylenol, alleve are meds decreasing the inflamation. Ice is okay  In afternoon and evening and hot shower in the am Avoid bending, stooping and avoid lifting weights greater than 10 lbs. Avoid prolong standing and walking. Avoid frequent bending and stooping  No lifting greater than 10 lbs. May use ice or moist heat for pain. Weight loss is of benefit. Handicap license is approved. Dr. Romona Curls secretary/Assistant will call to arrange for epidural steroid injection    Follow-Up Instructions: No follow-ups on file.   Orders:  No orders of the defined types were placed in this encounter.  No orders of the defined types were placed in this encounter.     Procedures: Large Joint Inj: R knee on 11/10/2019 11:26 AM Indications: pain Details: 25 G 1.5 in needle, anterolateral approach  Arthrogram: No  Medications: 40 mg methylPREDNISolone acetate 40 MG/ML; 3 mL bupivacaine 0.5 % Outcome: tolerated well, no immediate complications Procedure, treatment alternatives, risks and benefits explained, specific risks discussed. Consent was given by the patient. Immediately prior to procedure a time out was called to verify the correct patient, procedure, equipment, support  staff and site/side marked as required. Patient was prepped and draped in the usual sterile fashion.       Clinical Data: No additional findings.   Subjective: Chief Complaint  Patient presents with  . Lower Back - Follow-up    She had a Right L4- IL injection with Dr. Ernestina Patches on 10/19/2019, she states that she did get relief from the injection however she is still having the right knee pain, states that when she walks sound broken, uses ACE wrap on it.    49 year old female with spondylolisthesis and pain into the right knee and right medial foot. Underwent right L4-5 IL ESI. Post injection pain was improved decreased to about 4-5 of 10. Previously the pain was such that she was unable to walk or stand. Now that has improved.  No bowel or bladder difficulty. Can walk better now.    Review of Systems  Constitutional: Negative.   HENT: Negative.   Eyes: Negative.   Respiratory: Negative.   Cardiovascular: Negative.   Gastrointestinal: Negative.   Endocrine: Negative.   Genitourinary: Negative.   Musculoskeletal: Positive for gait problem.  Skin: Negative.   Allergic/Immunologic: Negative.   Neurological: Positive for weakness and numbness.  Hematological: Negative.   Psychiatric/Behavioral: Negative.      Objective: Vital Signs: BP (!) 163/67 (BP Location: Left Arm, Patient Position: Sitting)   Pulse 66   Ht 5\' 1"  (1.549 m)   Wt 191 lb (86.6 kg)   LMP 03/23/2017  BMI 36.09 kg/m   Physical Exam Constitutional:      Appearance: She is well-developed.  HENT:     Head: Normocephalic and atraumatic.  Eyes:     Pupils: Pupils are equal, round, and reactive to light.  Pulmonary:     Effort: Pulmonary effort is normal.     Breath sounds: Normal breath sounds.  Abdominal:     General: Bowel sounds are normal.     Palpations: Abdomen is soft.  Musculoskeletal:        General: Normal range of motion.     Cervical back: Normal range of motion and neck supple.   Skin:    General: Skin is warm and dry.  Neurological:     Mental Status: She is alert and oriented to person, place, and time.  Psychiatric:        Behavior: Behavior normal.        Thought Content: Thought content normal.        Judgment: Judgment normal.     Back Exam   Tenderness  The patient is experiencing tenderness in the lumbar.  Muscle Strength  Right Quadriceps:  5/5  Left Quadriceps:  5/5  Right Hamstrings:  5/5  Left Hamstrings:  5/5   Reflexes  Patellar: 0/4 Achilles: 0/4  Other  Toe walk: normal Heel walk: normal Gait: antalgic   Comments:  Right knee pain persists.       Specialty Comments:  No specialty comments available.  Imaging: No results found.   PMFS History: Patient Active Problem List   Diagnosis Date Noted  . Radicular pain of right lower extremity 04/18/2019  . Other intervertebral disc degeneration, lumbar region 04/18/2019  . Radiculopathy of lumbosacral region 03/21/2019  . Right leg pain 03/21/2019  . Impaired fasting blood sugar 02/23/2019  . Cold sore 10/18/2018  . Chest pain 09/23/2018  . Neuropathy involving both lower extremities 09/23/2018  . Screen for colon cancer 05/17/2018  . Vaccine counseling 05/17/2018  . Fatty liver 05/17/2018  . Screening for cervical cancer 05/17/2018  . Hypothyroidism 05/17/2018  . Need for pneumococcal vaccination 05/17/2018  . Generalized abdominal tenderness 05/17/2018  . Need for influenza vaccination 04/13/2018  . Other fatigue 02/08/2018  . Edema 01/25/2018  . Renal failure (ARF), acute on chronic (HCC) 01/20/2018  . Normocytic normochromic anemia 01/20/2018  . Tremor 01/05/2018  . Vitamin D deficiency 01/05/2018  . RUQ abdominal pain 10/27/2017  . Hemorrhoids 10/27/2017  . High risk medication use 09/25/2017  . Type 2 diabetes mellitus with neurological complications ([place]) 60/45/4098  . Proteinuria 09/02/2017  . Paresthesia 09/02/2017  . IgA nephropathy 06/10/2017  .  Language barrier 06/10/2017  . Gastroesophageal reflux disease 06/10/2017  . SOB (shortness of breath) 02/24/2017  . Hyperlipidemia 04/21/2016  . Chronic right-sided low back pain with right-sided sciatica 01/29/2016  . Morbid obesity ([place]) 01/29/2016  . Hypokalemia 01/29/2016  . Essential hypertension 01/29/2016   Past Medical History:  Diagnosis Date  . Diabetes mellitus without complication ([place])   . Fatty liver 2019   elevated LFTs, negative for Hep B and Hep C 04/2018  . Hypertension   . Hypothyroidism 2019  . IgA nephropathy 2018   Dr. Corliss Parish  . Language barrier   . Obesity   . Proteinuria     Family History  Problem Relation Age of Onset  . Diabetes Mother   . Hypertension Father   . Colon cancer Neg Hx   . Stomach cancer Neg Hx   .  Esophageal cancer Neg Hx   . Rectal cancer Neg Hx   . Liver cancer Neg Hx     Past Surgical History:  Procedure Laterality Date  . RENAL BIOPSY  2018   IgA nephropathy, Dr. Corliss Parish   Social History   Occupational History  . Occupation: homemaker  Tobacco Use  . Smoking status: Never Smoker  . Smokeless tobacco: Never Used  Substance and Sexual Activity  . Alcohol use: Never  . Drug use: No  . Sexual activity: Not on file

## 2019-11-22 ENCOUNTER — Ambulatory Visit: Payer: Medicaid Other | Attending: Internal Medicine

## 2019-11-22 DIAGNOSIS — Z23 Encounter for immunization: Secondary | ICD-10-CM

## 2019-11-22 NOTE — Progress Notes (Signed)
   Covid-19 Vaccination Clinic  Name:  BRICE POTTEIGER    MRN: [id number] DOB: [birth year]  11/22/2019  Ms. Byrer was observed post Covid-19 immunization for 15 minutes without incident. She was provided with Vaccine Information Sheet and instruction to access the V-Safe system.   Ms. Dimiceli was instructed to call 911 with any severe reactions post vaccine: Marland Kitchen Difficulty breathing  . Swelling of face and throat  . A fast heartbeat  . A bad rash all over body  . Dizziness and weakness   Immunizations Administered    Name Date Dose VIS Date Route   Pfizer COVID-19 Vaccine 11/22/2019  9:06 AM 0.3 mL 09/28/2018 Intramuscular   Manufacturer: [place]   Lot: [id number]   [place]: [id number]

## 2019-12-12 ENCOUNTER — Encounter: Payer: Self-pay | Admitting: Physical Medicine and Rehabilitation

## 2019-12-12 ENCOUNTER — Telehealth: Payer: Self-pay | Admitting: Medical

## 2019-12-12 ENCOUNTER — Other Ambulatory Visit: Payer: Self-pay | Admitting: Medical

## 2019-12-12 ENCOUNTER — Ambulatory Visit: Payer: Medicaid Other | Admitting: Physical Medicine and Rehabilitation

## 2019-12-12 ENCOUNTER — Other Ambulatory Visit: Payer: Self-pay

## 2019-12-12 ENCOUNTER — Ambulatory Visit: Payer: Self-pay

## 2019-12-12 VITALS — BP 164/95 | HR 68

## 2019-12-12 DIAGNOSIS — M5416 Radiculopathy, lumbar region: Secondary | ICD-10-CM | POA: Diagnosis not present

## 2019-12-12 DIAGNOSIS — M48062 Spinal stenosis, lumbar region with neurogenic claudication: Secondary | ICD-10-CM

## 2019-12-12 MED ORDER — METHYLPREDNISOLONE ACETATE 80 MG/ML IJ SUSP
40.0000 mg | Freq: Once | INTRAMUSCULAR | Status: AC
  Administered 2019-12-12: 40 mg

## 2019-12-12 NOTE — Progress Notes (Signed)
Linda Black - 49 y.o. female MRN [id number]  Date of birth: Jun 29, 1971  Office Visit Note: Visit Date: 12/12/2019 PCP: Carlena Hurl, PA-C Referred by: Carlena Hurl, PA-C  Subjective: Chief Complaint  Patient presents with  . Lower Back - Pain  . Right Thigh - Pain   HPI:  Linda Black is a 49 y.o. female who comes in today for planned Right L4-L5 lumbar epidural steroid injection with fluoroscopic guidance.  The patient has failed conservative care including home exercise, medications, time and activity modification.  This injection will be diagnostic and hopefully therapeutic.  Please see requesting physician notes for further details and justification.  Prior injection helped almost 100% for about a month and then after that it started to slowly return.  She does have moderate multifactorial stenosis.  She is out of some of her medications and will send a message to Dr. Stephens [facility] for refill.   ROS Otherwise per HPI.  Assessment & Plan: Visit Diagnoses:  1. Lumbar radiculopathy   2. Spinal stenosis of lumbar region with neurogenic claudication     Plan: No additional findings.   Meds & Orders:  Meds ordered this encounter  Medications  . methylPREDNISolone acetate (DEPO-MEDROL) injection 40 mg    Orders Placed This Encounter  Procedures  . XR C-ARM NO REPORT  . Epidural Steroid injection    Follow-up: Return for Basil Dess, MD.   Procedures: No procedures performed  Lumbar Epidural Steroid Injection - Interlaminar Approach with Fluoroscopic Guidance  Patient: Linda Black      Date of Birth: Jun 23, 1971 MRN: [id number] PCP: Carlena Hurl, PA-C      Visit Date: 12/12/2019   Universal Protocol:     Consent Given By: the patient  Position: PRONE  Additional Comments: Vital signs were monitored before and after the procedure. Patient was prepped and draped in the usual sterile fashion. The correct patient, procedure, and site was verified.    Injection Procedure Details:  Procedure Site One Meds Administered:  Meds ordered this encounter  Medications  . methylPREDNISolone acetate (DEPO-MEDROL) injection 40 mg     Laterality: Right  Location/Site:  L4-L5  Needle size: 20 G  Needle type: Tuohy  Needle Placement: Paramedian epidural  Findings:   -Comments: Excellent flow of contrast into the epidural space.  Procedure Details: Using a paramedian approach from the side mentioned above, the region overlying the inferior lamina was localized under fluoroscopic visualization and the soft tissues overlying this structure were infiltrated with 4 ml. of 1% Lidocaine without Epinephrine. The Tuohy needle was inserted into the epidural space using a paramedian approach.   The epidural space was localized using loss of resistance along with lateral and bi-planar fluoroscopic views.  After negative aspirate for air, blood, and CSF, a 2 ml. volume of Isovue-250 was injected into the epidural space and the flow of contrast was observed. Radiographs were obtained for documentation purposes.    The injectate was administered into the level noted above.   Additional Comments:  The patient tolerated the procedure well Dressing: 2 x 2 sterile gauze and Band-Aid    Post-procedure details: Patient was observed during the procedure. Post-procedure instructions were reviewed.  Patient left the clinic in stable condition.     Clinical History: MRI LUMBAR SPINE WITHOUT CONTRAST  TECHNIQUE: Multiplanar, multisequence MR imaging of the lumbar spine was performed. No intravenous contrast was administered.  COMPARISON:  None.  FINDINGS: Segmentation:  Standard.  Alignment:  No significant listhesis.  Vertebrae: Vertebral body heights are maintained. There is no significant marrow edema or suspicious osseous lesion identified  Conus medullaris and cauda equina: Conus extends to the L1 level. Conus and cauda equina  appear normal.  Paraspinal and other soft tissues: Unremarkable.  Disc levels:  L1-L2:  No significant canal or foraminal stenosis.  L2-L3:  No significant canal or foraminal stenosis.  L3-L4: Disc bulge and facet arthropathy with ligamentum flavum infolding. Mild canal stenosis. No significant foraminal stenosis.  L4-L5: Disc bulge with superimposed central disc protrusion and annular fissure. Endplate osteophytic ridging and facet arthropathy with ligamentum flavum infolding. Moderate canal stenosis with narrowing of lateral recesses, left greater than right. Mild to moderate foraminal stenosis, left greater than right.  L5-S1: Disc bulge with right subarticular annular fissure and facet arthropathy with ligamentum flavum infolding. No significant canal stenosis with partial effacement of the right lateral recess. Minor foraminal stenosis.  IMPRESSION: Multilevel degenerative changes as detailed above, greatest at L4-L5 and L5-S1.   Electronically Signed   By: Macy Mis M.D.   On: 08/24/2019 09:25     Objective:  VS:  HT:    WT:   BMI:     BP:(!) 164/95  HR:68bpm  TEMP: ( )  RESP:  Physical Exam Constitutional:      General: She is not in acute distress.    Appearance: Normal appearance. She is not ill-appearing.  HENT:     Head: Normocephalic and atraumatic.     Right Ear: External ear normal.     Left Ear: External ear normal.  Eyes:     Extraocular Movements: Extraocular movements intact.  Cardiovascular:     Rate and Rhythm: Normal rate.     Pulses: Normal pulses.  Musculoskeletal:     Right lower leg: No edema.     Left lower leg: No edema.     Comments: Patient has good distal strength with no pain over the greater trochanters.  No clonus or focal weakness.  Skin:    Findings: No erythema, lesion or rash.  Neurological:     General: No focal deficit present.     Mental Status: She is alert and oriented to person, place, and time.      Sensory: No sensory deficit.     Motor: No weakness or abnormal muscle tone.     Coordination: Coordination normal.  Psychiatric:        Mood and Affect: Mood normal.        Behavior: Behavior normal.      Imaging: No results found.

## 2019-12-12 NOTE — Procedures (Signed)
Lumbar Epidural Steroid Injection - Interlaminar Approach with Fluoroscopic Guidance  Patient: Linda Black      Date of Birth: Jul 22, 1971 MRN: [id number] PCP: Carlena Hurl, PA-C      Visit Date: 12/12/2019   Universal Protocol:     Consent Given By: the patient  Position: PRONE  Additional Comments: Vital signs were monitored before and after the procedure. Patient was prepped and draped in the usual sterile fashion. The correct patient, procedure, and site was verified.   Injection Procedure Details:  Procedure Site One Meds Administered:  Meds ordered this encounter  Medications  . methylPREDNISolone acetate (DEPO-MEDROL) injection 40 mg     Laterality: Right  Location/Site:  L4-L5  Needle size: 20 G  Needle type: Tuohy  Needle Placement: Paramedian epidural  Findings:   -Comments: Excellent flow of contrast into the epidural space.  Procedure Details: Using a paramedian approach from the side mentioned above, the region overlying the inferior lamina was localized under fluoroscopic visualization and the soft tissues overlying this structure were infiltrated with 4 ml. of 1% Lidocaine without Epinephrine. The Tuohy needle was inserted into the epidural space using a paramedian approach.   The epidural space was localized using loss of resistance along with lateral and bi-planar fluoroscopic views.  After negative aspirate for air, blood, and CSF, a 2 ml. volume of Isovue-250 was injected into the epidural space and the flow of contrast was observed. Radiographs were obtained for documentation purposes.    The injectate was administered into the level noted above.   Additional Comments:  The patient tolerated the procedure well Dressing: 2 x 2 sterile gauze and Band-Aid    Post-procedure details: Patient was observed during the procedure. Post-procedure instructions were reviewed.  Patient left the clinic in stable condition.

## 2019-12-12 NOTE — Telephone Encounter (Signed)
Please call daughter Sherie Don.[phone].  BP was apparently elevated at recent visit, Dr. Ernestina Patches.  Make sure she follows up with kidney doctor who also manages blood pressure.  Get her on schedule for fasting physical with me.

## 2019-12-12 NOTE — Progress Notes (Signed)
Pt states pain in the right side of the lower back and radiates into the right thigh. Pt states pain started 2 weeks ago and last injection 10/19/19 helped more than 50%. Pt states walking and sitting for a long period of time. Heating pad helps with pain.   .Numeric Pain Rating Scale and Functional Assessment Average Pain 8   In the last MONTH (on 0-10 scale) has pain interfered with the following?  1. General activity like being  able to carry out your everyday physical activities such as walking, climbing stairs, carrying groceries, or moving a chair?  Rating(8)   +Driver, -BT, -Dye Allergies.

## 2019-12-12 NOTE — Telephone Encounter (Signed)
Patient daughter was informed of provider message. Patient already has a physical scheduled for June 2021.

## 2019-12-13 ENCOUNTER — Other Ambulatory Visit: Payer: Self-pay | Admitting: Specialist

## 2019-12-13 ENCOUNTER — Other Ambulatory Visit: Payer: Self-pay | Admitting: Medical

## 2019-12-13 NOTE — Telephone Encounter (Signed)
She has established with a pain doctor at this point so please decline the medication and let her know to request refills through the pain doctor

## 2019-12-13 NOTE — Telephone Encounter (Signed)
Patient son has been informed to contact pain management for refills on Tramadol.

## 2019-12-22 ENCOUNTER — Encounter: Payer: Self-pay | Admitting: Specialist

## 2019-12-22 ENCOUNTER — Other Ambulatory Visit: Payer: Self-pay

## 2019-12-22 ENCOUNTER — Ambulatory Visit (INDEPENDENT_AMBULATORY_CARE_PROVIDER_SITE_OTHER): Payer: Medicaid Other | Admitting: Specialist

## 2019-12-22 VITALS — BP 157/93 | HR 66 | Ht 61.0 in | Wt 191.0 lb

## 2019-12-22 DIAGNOSIS — M4316 Spondylolisthesis, lumbar region: Secondary | ICD-10-CM | POA: Diagnosis not present

## 2019-12-22 DIAGNOSIS — M1711 Unilateral primary osteoarthritis, right knee: Secondary | ICD-10-CM

## 2019-12-22 DIAGNOSIS — M48062 Spinal stenosis, lumbar region with neurogenic claudication: Secondary | ICD-10-CM

## 2019-12-22 DIAGNOSIS — M222X1 Patellofemoral disorders, right knee: Secondary | ICD-10-CM | POA: Diagnosis not present

## 2019-12-22 MED ORDER — DICLOFENAC SODIUM 50 MG PO TBEC: DELAYED_RELEASE_TABLET | ORAL | 3 refills | Status: AC

## 2019-12-22 MED ORDER — TRAMADOL HCL 50 MG PO TABS: ORAL_TABLET | ORAL | 0 refills | Status: DC

## 2019-12-22 NOTE — Patient Instructions (Addendum)
Avoid bending, stooping and avoid lifting weights greater than 10 lbs. Avoid prolong standing and walking. Avoid frequent bending and stooping  No lifting greater than 10 lbs. May use ice or moist heat for pain. Weight loss is of benefit. Handicap license is approved. Dr. Romona Curls secretary/Assistant will call to arrange for epidural steroid injection The main ways of treat osteoarthritis, that are found to be success. Weight loss helps to decrease pain. Exercise is important to maintaining cartilage and thickness and strengthening. NSAIDs like diclofenac, motrin, tylenol, alleve are meds decreasing the inflamation. Ice is okay  In afternoon and evening and hot shower in the am Physical therapy for lumbar spine and for right knee  Start diclofenac 50 mg ramp up to BID with meal or meals.

## 2019-12-22 NOTE — Progress Notes (Signed)
Office Visit Note   Patient: Linda Black           Date of Birth: 11-Nov-1970           MRN: [id number] Visit Date: 12/22/2019              Requested by: Carlena Hurl, PA-C [address] [place],  [place] [address] PCP: Carlena Hurl, PA-C   Assessment & Plan: Visit Diagnoses:  1. Spondylolisthesis of lumbar region   2. Spinal stenosis of lumbar region with neurogenic claudication   3. Unilateral primary osteoarthritis, right knee     Plan: Avoid bending, stooping and avoid lifting weights greater than 10 lbs. Avoid prolong standing and walking. Avoid frequent bending and stooping  No lifting greater than 10 lbs. May use ice or moist heat for pain. Weight loss is of benefit. Handicap license is approved. Dr. Romona Curls secretary/Assistant will call to arrange for epidural steroid injection The main ways of treat osteoarthritis, that are found to be success. Weight loss helps to decrease pain. Exercise is important to maintaining cartilage and thickness and strengthening. NSAIDs like diclofenac, motrin, tylenol, alleve are meds decreasing the inflamation. Ice is okay  In afternoon and evening and hot shower in the am Physical therapy for lumbar spine and for right knee  Start diclofenac 50 mg ramp up to BID with meal or meals.  Follow-Up Instructions: Return in about 4 weeks (around 01/19/2020).   Orders:  Orders Placed This Encounter  Procedures  . Ambulatory referral to Physical Medicine Rehab  . Ambulatory referral to Physical Therapy   Meds ordered this encounter  Medications  . traMADol (ULTRAM) 50 MG tablet    Sig: TAKE 2 TABLETS BYMOUTH EVERY 6 HOURS AS NEEDED FOR UPTO 7 DAYS    Dispense:  30 tablet    Refill:  0  . diclofenac (VOLTAREN) 50 MG EC tablet    Sig: Take 1 tablet (50 mg total) by mouth daily after breakfast for 5 days, THEN 1 tablet (50 mg total) 2 (two) times daily after a meal for 5 days.    Dispense:  60 tablet    Refill:  3      Procedures: No procedures performed   Clinical Data: No additional findings.   Subjective: Chief Complaint  Patient presents with  . Lower Back - Follow-up    She had a Right L4-5 IL injection with Dr. Ernestina Patches on 12/12/19 and states that she feels better.    49 year old female with spinal stenosis and spondylolisthesis L4-5. Underwent IL ESI by Dr. Ernestina Patches 5/10 with good improvement in pain Relief from "10" to "4". She has persistent discomfort and is still taking tramadol 50mg  one tablet. She is having right lateral calf pain and is still limited in standing and walking.    Review of Systems   Objective: Vital Signs: BP (!) 157/93 (BP Location: Left Arm, Patient Position: Sitting)   Pulse 66   Ht 5\' 1"  (1.549 m)   Wt 191 lb (86.6 kg)   LMP 03/23/2017   BMI 36.09 kg/m   Physical Exam  Ortho Exam  Specialty Comments:  No specialty comments available.  Imaging: No results found.   PMFS History: Patient Active Problem List   Diagnosis Date Noted  . Radicular pain of right lower extremity 04/18/2019  . Other intervertebral disc degeneration, lumbar region 04/18/2019  . Radiculopathy of lumbosacral region 03/21/2019  . Right leg pain 03/21/2019  . Impaired fasting blood sugar  02/23/2019  . Cold sore 10/18/2018  . Chest pain 09/23/2018  . Neuropathy involving both lower extremities 09/23/2018  . Screen for colon cancer 05/17/2018  . Vaccine counseling 05/17/2018  . Fatty liver 05/17/2018  . Screening for cervical cancer 05/17/2018  . Hypothyroidism 05/17/2018  . Need for pneumococcal vaccination 05/17/2018  . Generalized abdominal tenderness 05/17/2018  . Need for influenza vaccination 04/13/2018  . Other fatigue 02/08/2018  . Edema 01/25/2018  . Renal failure (ARF), acute on chronic (HCC) 01/20/2018  . Normocytic normochromic anemia 01/20/2018  . Tremor 01/05/2018  . Vitamin D deficiency 01/05/2018  . RUQ abdominal pain 10/27/2017  . Hemorrhoids 10/27/2017    . High risk medication use 09/25/2017  . Type 2 diabetes mellitus with neurological complications ([place]) 62/10/5595  . Proteinuria 09/02/2017  . Paresthesia 09/02/2017  . IgA nephropathy 06/10/2017  . Language barrier 06/10/2017  . Gastroesophageal reflux disease 06/10/2017  . SOB (shortness of breath) 02/24/2017  . Hyperlipidemia 04/21/2016  . Chronic right-sided low back pain with right-sided sciatica 01/29/2016  . Morbid obesity ([place]) 01/29/2016  . Hypokalemia 01/29/2016  . Essential hypertension 01/29/2016   Past Medical History:  Diagnosis Date  . Diabetes mellitus without complication ([place])   . Fatty liver 2019   elevated LFTs, negative for Hep B and Hep C 04/2018  . Hypertension   . Hypothyroidism 2019  . IgA nephropathy 2018   Dr. Corliss Parish  . Language barrier   . Obesity   . Proteinuria     Family History  Problem Relation Age of Onset  . Diabetes Mother   . Hypertension Father   . Colon cancer Neg Hx   . Stomach cancer Neg Hx   . Esophageal cancer Neg Hx   . Rectal cancer Neg Hx   . Liver cancer Neg Hx     Past Surgical History:  Procedure Laterality Date  . RENAL BIOPSY  2018   IgA nephropathy, Dr. Corliss Parish   Social History   Occupational History  . Occupation: homemaker  Tobacco Use  . Smoking status: Never Smoker  . Smokeless tobacco: Never Used  Substance and Sexual Activity  . Alcohol use: Never  . Drug use: No  . Sexual activity: Not on file

## 2020-01-09 ENCOUNTER — Encounter: Payer: Self-pay | Admitting: Medical

## 2020-01-09 ENCOUNTER — Ambulatory Visit (INDEPENDENT_AMBULATORY_CARE_PROVIDER_SITE_OTHER): Payer: Medicaid Other | Admitting: Medical

## 2020-01-09 ENCOUNTER — Other Ambulatory Visit: Payer: Self-pay

## 2020-01-09 VITALS — BP 138/76 | HR 64 | Ht 61.0 in | Wt 188.0 lb

## 2020-01-09 DIAGNOSIS — Z7185 Encounter for immunization safety counseling: Secondary | ICD-10-CM

## 2020-01-09 DIAGNOSIS — Z0000 Encounter for general adult medical examination without abnormal findings: Secondary | ICD-10-CM | POA: Insufficient documentation

## 2020-01-09 DIAGNOSIS — M5441 Lumbago with sciatica, right side: Secondary | ICD-10-CM

## 2020-01-09 DIAGNOSIS — M5417 Radiculopathy, lumbosacral region: Secondary | ICD-10-CM

## 2020-01-09 DIAGNOSIS — Z7189 Other specified counseling: Secondary | ICD-10-CM | POA: Diagnosis not present

## 2020-01-09 DIAGNOSIS — N028 Recurrent and persistent hematuria with other morphologic changes: Secondary | ICD-10-CM

## 2020-01-09 DIAGNOSIS — Z6835 Body mass index (BMI) 35.0-35.9, adult: Secondary | ICD-10-CM | POA: Insufficient documentation

## 2020-01-09 DIAGNOSIS — E785 Hyperlipidemia, unspecified: Secondary | ICD-10-CM

## 2020-01-09 DIAGNOSIS — Z789 Other specified health status: Secondary | ICD-10-CM

## 2020-01-09 DIAGNOSIS — Z79899 Other long term (current) drug therapy: Secondary | ICD-10-CM

## 2020-01-09 DIAGNOSIS — E039 Hypothyroidism, unspecified: Secondary | ICD-10-CM

## 2020-01-09 DIAGNOSIS — R809 Proteinuria, unspecified: Secondary | ICD-10-CM

## 2020-01-09 DIAGNOSIS — Z1211 Encounter for screening for malignant neoplasm of colon: Secondary | ICD-10-CM | POA: Diagnosis not present

## 2020-01-09 DIAGNOSIS — G8929 Other chronic pain: Secondary | ICD-10-CM

## 2020-01-09 DIAGNOSIS — I10 Essential (primary) hypertension: Secondary | ICD-10-CM

## 2020-01-09 DIAGNOSIS — M5136 Other intervertebral disc degeneration, lumbar region: Secondary | ICD-10-CM

## 2020-01-09 DIAGNOSIS — E1149 Type 2 diabetes mellitus with other diabetic neurological complication: Secondary | ICD-10-CM

## 2020-01-09 DIAGNOSIS — G5793 Unspecified mononeuropathy of bilateral lower limbs: Secondary | ICD-10-CM

## 2020-01-09 DIAGNOSIS — K760 Fatty (change of) liver, not elsewhere classified: Secondary | ICD-10-CM

## 2020-01-09 DIAGNOSIS — R251 Tremor, unspecified: Secondary | ICD-10-CM

## 2020-01-09 DIAGNOSIS — K219 Gastro-esophageal reflux disease without esophagitis: Secondary | ICD-10-CM

## 2020-01-09 DIAGNOSIS — E559 Vitamin D deficiency, unspecified: Secondary | ICD-10-CM

## 2020-01-09 MED ORDER — VITAMIN D 25 MCG (1000 UNIT) PO TABS
1000.0000 [IU] | ORAL_TABLET | Freq: Every day | ORAL | 3 refills | Status: DC
Start: 2020-01-09 — End: 2020-09-19

## 2020-01-09 NOTE — Progress Notes (Signed)
Subjective: Chief Complaint  Patient presents with  . Annual Exam    with fasting labs    Here for physical  Accompanied by daughter Maddison Kilner who interprets and is the one she normally comes with to the office.  Medical team: Dr. Basil Dess, orthopedics Dr. Collier Flowers, endocrinology Dr. Corliss Parish, nephrology Not currently seeing eye doctor and dentist Julieanne Hadsall, Camelia Eng, PA-C here for primary care  Past Medical History:  Diagnosis Date  . Diabetes mellitus without complication (HCC)    Dr. Kelton Pillar  . Fatty liver 2019   elevated LFTs, negative for Hep B and Hep C 04/2018  . GERD (gastroesophageal reflux disease)   . Hyperlipidemia   . Hypertension   . Hypothyroidism 2019  . IgA nephropathy 2019   per biopsy, Dr. Corliss Parish  . Language barrier   . Lumbar stenosis    Dr. Louanne Skye  . Obesity   . Osteoarthritis   . Proteinuria     Past Surgical History:  Procedure Laterality Date  . RENAL BIOPSY  2018   IgA nephropathy, Dr. Corliss Parish    Social History   Socioeconomic History  . Marital status: Divorced    Spouse name: Not on file  . Number of children: 5  . Years of education: Not on file  . Highest education level: Not on file  Occupational History  . Occupation: homemaker  Tobacco Use  . Smoking status: Never Smoker  . Smokeless tobacco: Never Used  Substance and Sexual Activity  . Alcohol use: Never  . Drug use: No  . Sexual activity: Not on file  Other Topics Concern  . Not on file  Social History Narrative   Lives with family, non english speaking, likes to garden.   01/2020   Social Determinants of Health   Financial Resource Strain:   . Difficulty of Paying Living Expenses:   Food Insecurity:   . Worried About Charity fundraiser in the Last Year:   . Arboriculturist in the Last Year:   Transportation Needs:   . Film/video editor (Medical):   Marland Kitchen Lack of Transportation (Non-Medical):   Physical  Activity:   . Days of Exercise per Week:   . Minutes of Exercise per Session:   Stress:   . Feeling of Stress :   Social Connections:   . Frequency of Communication with Friends and Family:   . Frequency of Social Gatherings with Friends and Family:   . Attends Religious Services:   . Active Member of Clubs or Organizations:   . Attends Archivist Meetings:   Marland Kitchen Marital Status:   Intimate Partner Violence:   . Fear of Current or Ex-Partner:   . Emotionally Abused:   Marland Kitchen Physically Abused:   . Sexually Abused:     Family History  Problem Relation Age of Onset  . Diabetes Mother   . Hypertension Father   . Colon cancer Neg Hx   . Stomach cancer Neg Hx   . Esophageal cancer Neg Hx   . Rectal cancer Neg Hx   . Liver cancer Neg Hx      Current Outpatient Medications:  .  ACCU-CHEK AVIVA PLUS test strip, TEST TWICE A DAY, Disp: 100 strip, Rfl: 3 .  amLODipine (NORVASC) 10 MG tablet, Take 1 tablet (10 mg total) by mouth daily., Disp: 90 tablet, Rfl: 3 .  furosemide (LASIX) 40 MG tablet, Take 40 mg by mouth 2 (two) times daily., Disp: ,  Rfl:  .  gabapentin (NEURONTIN) 100 MG capsule, Take 1 capsule (100 mg total) by mouth at bedtime., Disp: 30 capsule, Rfl: 3 .  Insulin Pen Needle 32G X 4 MM MISC, 1 each by Does not apply route at bedtime., Disp: 100 each, Rfl: 11 .  levothyroxine (SYNTHROID) 25 MCG tablet, TAKE 1 TABLET(25 MCG) BY MOUTH DAILY BEFORE BREAKFAST, Disp: 30 tablet, Rfl: 1 .  losartan (COZAAR) 100 MG tablet, Take 100 mg by mouth daily., Disp: , Rfl:  .  metoprolol succinate (TOPROL-XL) 25 MG 24 hr tablet, TAKE 1 TABLET(25 MG) BY MOUTH DAILY. DISCARD VALSARTAN ORDER, Disp: 90 tablet, Rfl: 1 .  pantoprazole (PROTONIX) 40 MG tablet, Take 1 tablet (40 mg total) by mouth 2 (two) times daily., Disp: 60 tablet, Rfl: 3 .  potassium chloride SA (KLOR-CON) 20 MEQ tablet, TAKE 1 TABLET(20 MEQ) BY MOUTH DAILY, Disp: 90 tablet, Rfl: 0 .  rosuvastatin (CRESTOR) 40 MG tablet,  TAKE 1 TABLET(40 MG) BY MOUTH DAILY, Disp: 90 tablet, Rfl: 3 .  traMADol (ULTRAM) 50 MG tablet, TAKE 2 TABLETS BYMOUTH EVERY 6 HOURS AS NEEDED FOR UPTO 7 DAYS, Disp: 30 tablet, Rfl: 0 .  cholecalciferol (VITAMIN D3) 25 MCG (1000 UNIT) tablet, Take 1 tablet (1,000 Units total) by mouth daily., Disp: 90 tablet, Rfl: 3  Allergies  Allergen Reactions  . Lisinopril Swelling    Angioedema     Reviewed their medical, surgical, family, social, medication, and allergy history and updated chart as appropriate.   Review of Systems Constitutional: -fever, -chills, -sweats, -unexpected weight change, -decreased appetite, -fatigue Allergy: -sneezing, -itching, -congestion Dermatology: -changing moles, --rash, -lumps ENT: -runny nose, -ear pain, -sore throat, -hoarseness, -sinus pain, -teeth pain, - ringing in ears, -hearing loss, -nosebleeds Cardiology: -chest pain, -palpitations, -swelling, -difficulty breathing when lying flat, -waking up short of breath Respiratory: -cough, -shortness of breath, -difficulty breathing with exercise or exertion, -wheezing, -coughing up blood Gastroenterology: -abdominal pain, -nausea, -vomiting, -diarrhea, -constipation, -blood in stool, -changes in bowel movement, -difficulty swallowing or eating Hematology: -bleeding, -bruising  Musculoskeletal: -joint aches, -muscle aches, -joint swelling, -back pain, -neck pain, -cramping, -changes in gait Ophthalmology: denies vision changes, eye redness, itching, discharge Urology: -burning with urination, -difficulty urinating, -blood in urine, -urinary frequency, -urgency, -incontinence Neurology: -headache, -weakness, -tingling, -numbness, -memory loss, -falls, -dizziness Psychology: -depressed mood, -agitation, -sleep problems Breast/gyn: -breast tendnerss, -discharge, -lumps, -vaginal discharge,- irregular periods, -heavy periods     Objective:  BP 138/76   Pulse 64   Ht 5\' 1"  (1.549 m)   Wt 188 lb (85.3 kg)   LMP  03/23/2017   SpO2 97%   BMI 35.52 kg/m   General appearance: alert, no distress, WD/WN, Asian female Skin: scattered freckles, no worrisome lesions Neck: supple, no lymphadenopathy, no thyromegaly, no masses, normal ROM, no bruits Chest: non tender, normal shape and expansion Heart: RRR, normal S1, S2, no murmurs Lungs: CTA bilaterally, no wheezes, rhonchi, or rales Abdomen: +bs, soft, non tender, non distended, no masses, no hepatomegaly, no splenomegaly, no bruits Extremities: no edema, no cyanosis, no clubbing Pulses: 2+ symmetric, upper and lower extremities, normal cap refill Neurological: alert, oriented x 3, CN2-12 intact, strength normal upper extremities and lower extremities, sensation normal throughout, DTRs 2+ throughout, no cerebellar signs, gait normal Psychiatric: normal affect, behavior normal, pleasant  Breast/gyn/rectal - declined as her preference today  Diabetic Foot Exam - Simple   Simple Foot Form Diabetic Foot exam was performed with the following findings: Yes 01/09/2020  9:24 AM  Visual Inspection No deformities, no ulcerations, no other skin breakdown bilaterally: Yes Sensation Testing See comments: Yes Pulse Check See comments: Yes Comments 1+ pedal pulses, decreased sensation of bilateral feet with monofilament      Assessment and Plan :   Encounter Diagnoses  Name Primary?  . Encounter for health maintenance examination in adult Yes  . Vaccine counseling   . Screen for colon cancer   . Essential hypertension   . IgA nephropathy   . BMI 35.0-35.9,adult   . Hyperlipidemia, unspecified hyperlipidemia type   . Hypothyroidism, unspecified type   . Type 2 diabetes mellitus with neurological complications ([place])   . Fatty liver   . Gastroesophageal reflux disease, unspecified whether esophagitis present   . Chronic right-sided low back pain with right-sided sciatica   . Neuropathy involving both lower extremities   . Radiculopathy of lumbosacral  region   . Other intervertebral disc degeneration, lumbar region   . Language barrier   . Proteinuria, unspecified type   . High risk medication use   . Tremor   . Vitamin D deficiency     Physical exam - discussed and counseled on healthy lifestyle, diet, exercise, preventative care, vaccinations, sick and well care, proper use of emergency dept and after hours care, and addressed their concerns.    Health screening: Advised they see their eye doctor yearly for routine vision care. Advised they see their dentist yearly for routine dental care including hygiene visits twice yearly. Try and get established with eye doctor and dentist soon.   She should be seeing dentist twice yearly for cleaning/hygiene visits.   She should be seeing eye doctor yearly for routine evaluation and for diabetes screening for retinopathy.  Make sure you have eye doctor send [place] a copy of the report yearly.   Cancer screening Counseled on self breast exams, mammograms, cervical cancer screening  Cancer screening: Reviewed her 09/2019 mammogram that was normal Reviewed 05/2018 pap that was normal, HPV not detected Counseled on need for colonoscopy, she will check insurance coverage for this   Vaccines: She is up to date on Covid vaccine, Prevnar 44  Counseled on need for updated Tdap and Pneumococcal 23 vaccine, Shingrix vaccine.   Separate significant chronic issues discussed: Diabetes-labs today but advised she is due for follow-up with Dr. Kelton Pillar.  She needs to go ahead and make an appointment.  Continue glucose monitoring, try to eat healthy low sugar low-fat diet, exercise regimen, check feet daily  Hypertension-managed by nephrology  IgA nephropathy-reviewed February 2021 notes from nephrology including labs for renal function panel, CBC, urinalysis.  Medications were reviewed.  Fatty liver disease - Counseled on need for weight loss through healthy low-fat diet and regular  exercise  Neuropathy-continue current therapy  Spinal stenosis, osteoarthritis-currently seeing orthopedics for this  Vitamin D deficiency-restart supplement  Hypothyroidism-labs today, continue current therapy, follow-up with endocrinology    Medication Review All of the listed medications today are in order except for 3:  Metoprolol/Toprol is listed but you did not have this in your bag today.  Double check at home to see if she is taking this or not.  This is a blood pressure pill.   Dr. Shelva Majestic notes also show this medication but she didn't comment on this in her last note.  So you will need to verify if you are taking this, and also discuss with Dr. Moshe Cipro whether you need to continue this or not.  Potassium was also not in the bag of  medicaiton today, but you all note that you are taking this.  Lasix /Furosemide is listed in the kidney doctor records as 40mg  twice daily.  Your bottle shows 2 tablets in the morning (80mg ), and 1 tablet in the after noon.   This needs to be resolved between you and Dr. Drucilla Schmidt was seen today for annual exam.  Diagnoses and all orders for this visit:  Encounter for health maintenance examination in adult -     TSH -     Hemoglobin A1c -     Renal Function Panel -     Hepatic function panel  Vaccine counseling  Screen for colon cancer  Essential hypertension -     Renal Function Panel  IgA nephropathy -     Renal Function Panel  BMI 35.0-35.9,adult  Hyperlipidemia, unspecified hyperlipidemia type  Hypothyroidism, unspecified type -     TSH  Type 2 diabetes mellitus with neurological complications (HCC) -     Hemoglobin A1c  Fatty liver -     Hepatic function panel  Gastroesophageal reflux disease, unspecified whether esophagitis present  Chronic right-sided low back pain with right-sided sciatica  Neuropathy involving both lower extremities  Radiculopathy of lumbosacral region  Other  intervertebral disc degeneration, lumbar region  Language barrier  Proteinuria, unspecified type  High risk medication use -     Hepatic function panel  Tremor  Vitamin D deficiency  Other orders -     cholecalciferol (VITAMIN D3) 25 MCG (1000 UNIT) tablet; Take 1 tablet (1,000 Units total) by mouth daily.    Follow-up pending labs, yearly for physical

## 2020-01-09 NOTE — Patient Instructions (Addendum)
It was a pleasaure to see you today  Eye doctor and dentist Try and get established with eye doctor and dentist soon.   She should be seeing dentist twice yearly for cleaning/hygeine visits.   She should be seeing eye doctor yearly for routine evaluation and for diabetes screening for retinopathy.  Make sure you have eye doctor send [place] a copy of the report yearly.    Vaccines: You are up to date on Covid vaccine, Prevnar 13  Counseled on need for updated Tdap and Pneumococccal 23 vaccine, Shingrix vaccine.  Shingles vaccine:  I recommend you have a shingles vaccine to help prevent shingles or herpes zoster outbreak.   Please call your insurer to inquire about coverage for the Shingrix vaccine given in 2 doses.   Some insurers cover this vaccine after age 42, some cover this after age 47.  If your insurer covers this, then call to schedule appointment to have this vaccine here.  I also recommend a tetanus vaccine booster good for 10 years and Pneumococcal 23 vaccine to help prevent against other strains of pneumonoia   Cancer screening: Your 09/2019 mammogram was normal Your 05/2018 pap that was normal, HPV not detected  I recommend you check insurance coverage for screening colonoscopy.   New guidelines recommmend colon cancer screening at age 47 yo and up.        Medication Review All of the listed medications today are in order except for 3:  Metoprolol/Toprol is listed but you did not have this in your bag today.  Double check at home to see if she is taking this or not.  This is a blood pressure pill.   Dr. Shelva Majestic notes also show this medication but she didn't comment on this in her last note.  So you will need to verify if you are taking this, and also discuss with Dr. Moshe Cipro whether you need to continue this or not.  Potassium was also not in the bag of medicaitons today, but you all note that you are taking this.  Lasix /Furosemide is listed in the kidney doctor  records as 40mg  twice daliy.  Your bottle shows 2 tablets in the morning (80mg ), and 1 tablet in the after noon.   This needs to be resolved between you and Dr. Moshe Cipro    Diabetes  Go ahead and schedule follow up with Dr. Kelton Pillar   Kidney doctor  Follow up with Dr. Ebony Hail Liver disease Try and get exercise such as walking, biking, or other Work on eating low fat, low sugars food

## 2020-01-10 LAB — RENAL FUNCTION PANEL
Albumin: 4.4 g/dL (ref 3.8–4.8)
BUN/Creatinine Ratio: 14 (ref 9–23)
BUN: 30 mg/dL — ABNORMAL HIGH (ref 6–24)
CO2: 24 mmol/L (ref 20–29)
Calcium: 9.2 mg/dL (ref 8.7–10.2)
Chloride: 99 mmol/L (ref 96–106)
Creatinine, Ser: 2.22 mg/dL — ABNORMAL HIGH (ref 0.57–1.00)
GFR calc Af Amer: 29 mL/min/{1.73_m2} — ABNORMAL LOW (ref >59)
GFR calc non Af Amer: 25 mL/min/{1.73_m2} — ABNORMAL LOW (ref >59)
Glucose: 80 mg/dL (ref 65–99)
Phosphorus: 4.3 mg/dL (ref 3.0–4.3)
Potassium: 3.3 mmol/L — ABNORMAL LOW (ref 3.5–5.2)
Sodium: 140 mmol/L (ref 134–144)

## 2020-01-10 LAB — HEMOGLOBIN A1C
Est. average glucose Bld gHb Est-mCnc: 117 mg/dL
Hgb A1c MFr Bld: 5.7 % — ABNORMAL HIGH (ref 4.8–5.6)

## 2020-01-10 LAB — HEPATIC FUNCTION PANEL
ALT: 37 IU/L — ABNORMAL HIGH (ref 0–32)
AST: 26 IU/L (ref 0–40)
Alkaline Phosphatase: 101 IU/L (ref 48–121)
Bilirubin Total: 0.7 mg/dL (ref 0.0–1.2)
Bilirubin, Direct: 0.21 mg/dL (ref 0.00–0.40)
Total Protein: 6.8 g/dL (ref 6.0–8.5)

## 2020-01-10 LAB — TSH: TSH: 3.14 u[IU]/mL (ref 0.450–4.500)

## 2020-01-13 ENCOUNTER — Other Ambulatory Visit: Payer: Self-pay

## 2020-01-13 ENCOUNTER — Ambulatory Visit: Payer: Medicaid Other | Attending: Specialist | Admitting: Physical Therapy

## 2020-01-13 ENCOUNTER — Encounter: Payer: Self-pay | Admitting: Physical Therapy

## 2020-01-13 DIAGNOSIS — M62830 Muscle spasm of back: Secondary | ICD-10-CM | POA: Insufficient documentation

## 2020-01-13 DIAGNOSIS — G8929 Other chronic pain: Secondary | ICD-10-CM

## 2020-01-13 DIAGNOSIS — R262 Difficulty in walking, not elsewhere classified: Secondary | ICD-10-CM

## 2020-01-13 DIAGNOSIS — M25561 Pain in right knee: Secondary | ICD-10-CM | POA: Diagnosis present

## 2020-01-13 DIAGNOSIS — R531 Weakness: Secondary | ICD-10-CM

## 2020-01-13 DIAGNOSIS — M545 Low back pain: Secondary | ICD-10-CM | POA: Insufficient documentation

## 2020-01-13 NOTE — Therapy (Signed)
Linda Black, [name], [address] Phone: [phone]   Fax:  [phone]  Physical Therapy Evaluation  Patient Details  Name: Linda Black MRN: [id number] Date of Birth: [birth year] Referring Provider (PT): Louanne Skye   Encounter Date: 01/13/2020   PT End of Session - 01/13/20 0915    Visit Number 1    Number of Visits 4    Date for PT Re-Evaluation 02/10/20    Authorization Type MCD request 3 initial visits    PT Start Time 0840    PT Stop Time 0920    PT Time Calculation (min) 40 min    Activity Tolerance Patient tolerated treatment well    Behavior During Therapy [facility] for tasks assessed/performed           Past Medical History:  Diagnosis Date  . Diabetes mellitus without complication (HCC)    Dr. Kelton Pillar  . Fatty liver 2019   elevated LFTs, negative for Hep B and Hep C 04/2018  . GERD (gastroesophageal reflux disease)   . Hyperlipidemia   . Hypertension   . Hypothyroidism 2019  . IgA nephropathy 2019   per biopsy, Dr. Corliss Parish  . Language barrier   . Lumbar stenosis    Dr. Louanne Skye  . Obesity   . Osteoarthritis   . Proteinuria     Past Surgical History:  Procedure Laterality Date  . RENAL BIOPSY  2018   IgA nephropathy, Dr. Corliss Parish    There were no vitals filed for this visit.    Subjective Assessment - 01/13/20 0842    Subjective Patient was sent to PT about 4 months ago, we saw her 3 visits without much change.  She saw the MD and was referred back to [place] for right knee pain and lumbar DDD.  She reports that she has had injections that help  for a little while but pain would come back, she reports that the treatment that we were doing helped the back some, but the exercises all increased the right knee pain and swelling    Pertinent History Had PT for this prior - she has stopped doing these because they hurt    How long can you walk comfortably? has used a  SPC in the past, reports 10 mintues and really hurts and swell    Diagnostic tests x-rays - spondylolisthesis    Currently in Pain? Yes    Pain Score 5     Pain Location Knee   and low back   Pain Orientation Right    Pain Descriptors / Indicators Dull    Pain Type Chronic pain    Pain Onset More than a month ago    Pain Frequency Constant    Aggravating Factors  standing and walking, sitting long periods, pain up to 10/10, reports swelling iwth activity    Pain Relieving Factors medicine (tramadol)    Effect of Pain on Daily Activities limits everything              [facility] PT Assessment - 01/13/20 0001      Assessment   Medical Diagnosis LBP, right knee pain    Referring Provider (PT) Louanne Skye    Onset Date/Surgical Date 09/20/18    Hand Dominance Right    Prior Therapy September 2020, March 2021      Balance Screen   Has the patient fallen in the past 6 months No    Has the patient had  a decrease in activity level because of a fear of falling?  No    Is the patient reluctant to leave their home because of a fear of falling?  No      Home Environment   Additional Comments no stairs, some cooking, some cleaning , some small kids around the house      Prior Function   Level of Independence Independent    Vocation Unemployed    Leisure does enjoy the garden      AROM   Overall AROM Comments lumbar ROM decreased 25% with c/o pain and tightness in the low back    Right/Left Knee Right    Right Knee Extension 0   pain in patella   Right Knee Flexion 95   pain in the superior patella     Strength   Right/Left Knee Right    Right Knee Flexion 4-/5   pain in the patella   Right Knee Extension 4-/5   pain in the patella     Flexibility   Hamstrings supine SLR 90 bilat    Quadriceps very painful woth strech    Piriformis tight Rt       Palpation   Palpation comment tight and tender in the lumbar paraspinals, tightness in teh buttock, very tender patellar tendon and  superiorly into the quad      Ambulation/Gait   Gait Comments has antalgic gait on the right, slow, small steps                      Objective measurements completed on examination: See above findings.       [facility] Adult PT Treatment/Exercise - 01/13/20 0001      Modalities   Modalities Iontophoresis      Iontophoresis   Type of Iontophoresis Dexamethasone    Location rigth patellar tendon    Dose 68mA    Time 4 hour patch                       PT Long Term Goals - 01/13/20 1962      PT LONG TERM GOAL #1   Title I with HEP    Time 8    Period Weeks      PT LONG TERM GOAL #2   Title increase right knee AROM to 0-110 degrees flexion    Time 8    Period Weeks    Status New      PT LONG TERM GOAL #3   Title increase lumbar ROM 25%    Time 8    Period Weeks    Status New      PT LONG TERM GOAL #4   Title report =/> 75% reduction of overall pain    Time 8    Period Weeks    Status New      PT LONG TERM GOAL #5   Title report able to walk 20 minutes without pain >4/10    Time 8    Period Weeks    Status New                  Plan - 01/13/20 0916    Clinical Impression Statement Patient has low back pain and right knee pain, we have seen here in the past with her reporting some decreased pain i nthe low back but no change in right knee pain.  She is complaining of more right knee pain than low back pain  today.  She reports that any standing or walking 10 minutes will increase swelling and knee pain, still c/o backpain with housework, bending and lifting.  She is very tender with palpation of the patellar tendon area and around the right patellar area.  She does have antalgic gait and lateral tracking patella, she is tight inthe LE's    Personal Factors and Comorbidities Comorbidity 3+;Behavior Pattern;Past/Current Experience;Social Background    Comorbidities DM, HTN, obesity    Examination-Activity Limitations Bathing;Carry;Lift      Examination-Participation Restrictions Laundry;Other;Yard Work;Community Activity;Cleaning    Stability/Clinical Decision Making Evolving/Moderate complexity    Clinical Decision Making Moderate    Rehab Potential Fair    PT Frequency 1x / week    PT Duration 8 weeks    PT Treatment/Interventions Taping;Iontophoresis 4mg /ml Dexamethasone;Patient/family education;Functional mobility training;Moist Heat;Ultrasound;Traction;Therapeutic activities;Passive range of motion;Therapeutic exercise;Cryotherapy;Electrical Stimulation;Balance training;Manual techniques;Dry needling;Spinal Manipulations;Joint Manipulations;ADLs/Self Care Home Management;Gait training;Stair training    PT Next Visit Plan patient reports that we have helped her back pain some in the past, she is c/o more right knee pain.  Will try ionto, could try taping    Consulted and Agree with Plan of Care Patient           Patient will benefit from skilled therapeutic intervention in order to improve the following deficits and impairments:  Decreased range of motion, Obesity, Increased muscle spasms, Decreased knowledge of precautions, Pain, Decreased strength, Abnormal gait, Difficulty walking, Impaired flexibility, Decreased mobility, Increased edema  Visit Diagnosis: Chronic pain of right knee - Plan: PT plan of care cert/re-cert  Chronic bilateral low back pain without sciatica - Plan: PT plan of care cert/re-cert  General weakness - Plan: PT plan of care cert/re-cert  Muscle spasm of back - Plan: PT plan of care cert/re-cert  Difficulty in walking, not elsewhere classified - Plan: PT plan of care cert/re-cert     Problem List Patient Active Problem List   Diagnosis Date Noted  . Encounter for health maintenance examination in adult 01/09/2020  . BMI 35.0-35.9,adult 01/09/2020  . Radicular pain of right lower extremity 04/18/2019  . Other intervertebral disc degeneration, lumbar region 04/18/2019  . Radiculopathy of  lumbosacral region 03/21/2019  . Right leg pain 03/21/2019  . Chest pain 09/23/2018  . Neuropathy involving both lower extremities 09/23/2018  . Screen for colon cancer 05/17/2018  . Vaccine counseling 05/17/2018  . Fatty liver 05/17/2018  . Screening for cervical cancer 05/17/2018  . Hypothyroidism 05/17/2018  . Need for pneumococcal vaccination 05/17/2018  . Generalized abdominal tenderness 05/17/2018  . Need for influenza vaccination 04/13/2018  . Other fatigue 02/08/2018  . Edema 01/25/2018  . Renal failure (ARF), acute on chronic (HCC) 01/20/2018  . Normocytic normochromic anemia 01/20/2018  . Tremor 01/05/2018  . Vitamin D deficiency 01/05/2018  . RUQ abdominal pain 10/27/2017  . Hemorrhoids 10/27/2017  . High risk medication use 09/25/2017  . Type 2 diabetes mellitus with neurological complications ([place]) 15/40/0867  . Proteinuria 09/02/2017  . Paresthesia 09/02/2017  . IgA nephropathy 06/10/2017  . Language barrier 06/10/2017  . Gastroesophageal reflux disease 06/10/2017  . SOB (shortness of breath) 02/24/2017  . Hyperlipidemia 04/21/2016  . Chronic right-sided low back pain with right-sided sciatica 01/29/2016  . Morbid obesity ([place]) 01/29/2016  . Hypokalemia 01/29/2016  . Essential hypertension 01/29/2016    Sumner Boast., PT 01/13/2020, 9:24 AM  [address] [place], [name], [address] Phone: [phone]   Fax:  [phone]  Name: Linda Black MRN: [id number] Date of Birth: [birth year]

## 2020-01-18 ENCOUNTER — Other Ambulatory Visit: Payer: Self-pay

## 2020-01-18 ENCOUNTER — Ambulatory Visit (INDEPENDENT_AMBULATORY_CARE_PROVIDER_SITE_OTHER): Payer: Medicaid Other | Admitting: Specialist

## 2020-01-18 ENCOUNTER — Encounter: Payer: Self-pay | Admitting: Specialist

## 2020-01-18 VITALS — BP 135/90 | HR 70 | Ht 61.0 in | Wt 191.0 lb

## 2020-01-18 DIAGNOSIS — M48062 Spinal stenosis, lumbar region with neurogenic claudication: Secondary | ICD-10-CM | POA: Diagnosis not present

## 2020-01-18 DIAGNOSIS — M4316 Spondylolisthesis, lumbar region: Secondary | ICD-10-CM

## 2020-01-18 DIAGNOSIS — M25561 Pain in right knee: Secondary | ICD-10-CM

## 2020-01-18 DIAGNOSIS — M2391 Unspecified internal derangement of right knee: Secondary | ICD-10-CM

## 2020-01-18 DIAGNOSIS — M5441 Lumbago with sciatica, right side: Secondary | ICD-10-CM

## 2020-01-18 DIAGNOSIS — M1711 Unilateral primary osteoarthritis, right knee: Secondary | ICD-10-CM

## 2020-01-18 DIAGNOSIS — M222X1 Patellofemoral disorders, right knee: Secondary | ICD-10-CM | POA: Diagnosis not present

## 2020-01-18 DIAGNOSIS — M5442 Lumbago with sciatica, left side: Secondary | ICD-10-CM

## 2020-01-18 DIAGNOSIS — G8929 Other chronic pain: Secondary | ICD-10-CM

## 2020-01-18 MED ORDER — TRAMADOL HCL 50 MG PO TABS: ORAL_TABLET | ORAL | 0 refills | Status: DC

## 2020-01-18 NOTE — Progress Notes (Signed)
Office Visit Note   Patient: Linda Black           Date of Birth: 30-Oct-1970           MRN: [id number] Visit Date: 01/18/2020              Requested by: Carlena Hurl, PA-C [address] [place],  [place] [address] PCP: Carlena Hurl, PA-C   Assessment & Plan: Visit Diagnoses:  1. Spondylolisthesis of lumbar region   2. Spinal stenosis of lumbar region with neurogenic claudication   3. Patellofemoral pain syndrome of right knee   4. Unilateral primary osteoarthritis, right knee   5. Chronic bilateral low back pain with bilateral sciatica   6. Chronic pain of right knee     Plan: The main ways of treat osteoarthritis, that are found to be success. Weight loss helps to decrease pain. Exercise is important to maintaining cartilage and thickness and strengthening. DO NOT TAKE NSAIDs like motrin, tylenol, alleve are meds decreasing the inflamation but they will hurt your kidney function so you should  Avoid them.  Take tramadol for pain. Limit tylenol to 3-4 tablets total per day.  Ice is okay  In afternoon and evening and hot shower in the am MRI of the right knee ordered due to persistent pain and knee swelling to assess for possible torn cartilage.   Follow-Up Instructions: Return in about 3 weeks (around 02/08/2020).   Orders:  Orders Placed This Encounter  Procedures  . MR Knee Right w/o contrast   Meds ordered this encounter  Medications  . traMADol (ULTRAM) 50 MG tablet    Sig: TAKE 2 TABLETS BYMOUTH EVERY 6 HOURS AS NEEDED FOR UPTO 7 DAYS    Dispense:  30 tablet    Refill:  0      Procedures: No procedures performed   Clinical Data: No additional findings.   Subjective: Chief Complaint  Patient presents with  . Lower Back - Follow-up  . Right Knee - Follow-up, Pain    49 year old female with diabetes, HTN, she is having right knee pain and difficulty getting out of the house due to knee pain. AM stiffness, went to PT and was taught exercises  and has had injection of the right knee. No popping or catching just swelling and pain.  First thing in The AM. Went to PT and the next appointment is for the 6/21. She has been there before for PT for the right knee. No bowel or bladder difficutly. Pain with walking and stair climbing. She is still hurting.  Creatine is elevated at 2.2 and she is unable to take NSAIDS. She is taking Tramadol.    Review of Systems  Constitutional: Negative.   HENT: Negative.   Eyes: Negative.   Respiratory: Negative.   Cardiovascular: Negative.   Gastrointestinal: Negative.   Endocrine: Negative.   Genitourinary: Negative.   Musculoskeletal: Negative.   Skin: Negative.   Allergic/Immunologic: Negative.   Neurological: Negative.   Hematological: Negative.   Psychiatric/Behavioral: Negative.      Objective: Vital Signs: BP 135/90 (BP Location: Left Arm, Patient Position: Sitting)   Pulse 70   Ht 5\' 1"  (1.549 m)   Wt 191 lb (86.6 kg)   LMP 03/23/2017   BMI 36.09 kg/m   Physical Exam Constitutional:      Appearance: She is well-developed.  HENT:     Head: Normocephalic and atraumatic.  Eyes:     Pupils: Pupils are equal, round,  and reactive to light.  Pulmonary:     Effort: Pulmonary effort is normal.     Breath sounds: Normal breath sounds.  Abdominal:     General: Bowel sounds are normal.     Palpations: Abdomen is soft.  Musculoskeletal:        General: Normal range of motion.     Cervical back: Normal range of motion and neck supple.  Skin:    General: Skin is warm and dry.  Neurological:     Mental Status: She is alert and oriented to person, place, and time.  Psychiatric:        Behavior: Behavior normal.        Thought Content: Thought content normal.        Judgment: Judgment normal.     Right Knee Exam   Range of Motion  Extension: -10  Flexion: 110   Tests  Lachman:  Anterior - positive    Posterior - negative Drawer:  Anterior - positive    Posterior -  negative Pivot shift: 2+ Patellar apprehension: 2+  Other  Erythema: absent Scars: present Sensation: normal Pulse: present Swelling: moderate      Specialty Comments:  No specialty comments available.  Imaging: No results found.   PMFS History: Patient Active Problem List   Diagnosis Date Noted  . Encounter for health maintenance examination in adult 01/09/2020  . BMI 35.0-35.9,adult 01/09/2020  . Radicular pain of right lower extremity 04/18/2019  . Other intervertebral disc degeneration, lumbar region 04/18/2019  . Radiculopathy of lumbosacral region 03/21/2019  . Right leg pain 03/21/2019  . Chest pain 09/23/2018  . Neuropathy involving both lower extremities 09/23/2018  . Screen for colon cancer 05/17/2018  . Vaccine counseling 05/17/2018  . Fatty liver 05/17/2018  . Screening for cervical cancer 05/17/2018  . Hypothyroidism 05/17/2018  . Need for pneumococcal vaccination 05/17/2018  . Generalized abdominal tenderness 05/17/2018  . Need for influenza vaccination 04/13/2018  . Other fatigue 02/08/2018  . Edema 01/25/2018  . Renal failure (ARF), acute on chronic (HCC) 01/20/2018  . Normocytic normochromic anemia 01/20/2018  . Tremor 01/05/2018  . Vitamin D deficiency 01/05/2018  . RUQ abdominal pain 10/27/2017  . Hemorrhoids 10/27/2017  . High risk medication use 09/25/2017  . Type 2 diabetes mellitus with neurological complications ([place]) 25/12/3974  . Proteinuria 09/02/2017  . Paresthesia 09/02/2017  . IgA nephropathy 06/10/2017  . Language barrier 06/10/2017  . Gastroesophageal reflux disease 06/10/2017  . SOB (shortness of breath) 02/24/2017  . Hyperlipidemia 04/21/2016  . Chronic right-sided low back pain with right-sided sciatica 01/29/2016  . Morbid obesity ([place]) 01/29/2016  . Hypokalemia 01/29/2016  . Essential hypertension 01/29/2016   Past Medical History:  Diagnosis Date  . Diabetes mellitus without complication (HCC)    Dr. Kelton Pillar    . Fatty liver 2019   elevated LFTs, negative for Hep B and Hep C 04/2018  . GERD (gastroesophageal reflux disease)   . Hyperlipidemia   . Hypertension   . Hypothyroidism 2019  . IgA nephropathy 2019   per biopsy, Dr. Corliss Parish  . Language barrier   . Lumbar stenosis    Dr. Louanne Skye  . Obesity   . Osteoarthritis   . Proteinuria     Family History  Problem Relation Age of Onset  . Diabetes Mother   . Hypertension Father   . Colon cancer Neg Hx   . Stomach cancer Neg Hx   . Esophageal cancer Neg Hx   . Rectal cancer  Neg Hx   . Liver cancer Neg Hx     Past Surgical History:  Procedure Laterality Date  . RENAL BIOPSY  2018   IgA nephropathy, Dr. Corliss Parish   Social History   Occupational History  . Occupation: homemaker  Tobacco Use  . Smoking status: Never Smoker  . Smokeless tobacco: Never Used  Vaping Use  . Vaping Use: Never used  Substance and Sexual Activity  . Alcohol use: Never  . Drug use: No  . Sexual activity: Not on file

## 2020-01-18 NOTE — Patient Instructions (Signed)
The main ways of treat osteoarthritis, that are found to be success. Weight loss helps to decrease pain. Exercise is important to maintaining cartilage and thickness and strengthening. DO NOT TAKE NSAIDs like motrin, tylenol, alleve are meds decreasing the inflamation but they will hurt your kidney function so you should  Avoid them.  Take tramadol for pain. Limit tylenol to 3-4 tablets total per day.  Ice is okay  In afternoon and evening and hot shower in the am MRI of the right knee ordered due to persistent pain and knee swelling to assess for possible torn cartilage.

## 2020-01-19 ENCOUNTER — Encounter: Payer: Self-pay | Admitting: Physical Medicine and Rehabilitation

## 2020-01-19 ENCOUNTER — Ambulatory Visit: Payer: Self-pay

## 2020-01-19 ENCOUNTER — Ambulatory Visit (INDEPENDENT_AMBULATORY_CARE_PROVIDER_SITE_OTHER): Payer: Medicaid Other | Admitting: Physical Medicine and Rehabilitation

## 2020-01-19 VITALS — BP 133/87 | HR 67

## 2020-01-19 DIAGNOSIS — M5416 Radiculopathy, lumbar region: Secondary | ICD-10-CM | POA: Diagnosis not present

## 2020-01-19 DIAGNOSIS — M48062 Spinal stenosis, lumbar region with neurogenic claudication: Secondary | ICD-10-CM

## 2020-01-19 MED ORDER — METHYLPREDNISOLONE ACETATE 80 MG/ML IJ SUSP
80.0000 mg | Freq: Once | INTRAMUSCULAR | Status: AC
  Administered 2020-01-19: 80 mg

## 2020-01-19 NOTE — Progress Notes (Signed)
Linda Black - 49 y.o. female MRN [id number]  Date of birth: 1970-09-30  Office Visit Note: Visit Date: 01/19/2020 PCP: Carlena Hurl, PA-C Referred by: Carlena Hurl, PA-C  Subjective: Chief Complaint  Patient presents with  . Lower Back - Pain  . Right Leg - Pain  . Left Leg - Pain   HPI:  Linda Black is a 49 y.o. female who comes in today for planned repeat Right L4-L5 Lumbar epidural steroid injection with fluoroscopic guidance.  The patient has failed conservative care including home exercise, medications, time and activity modification.  This injection will be diagnostic and hopefully therapeutic.  Please see requesting physician notes for further details and justification. Patient received more than 50% pain relief from prior injection.   Referring: Dr. Basil Dess   ROS Otherwise per HPI.  Assessment & Plan: Visit Diagnoses:  1. Lumbar radiculopathy   2. Spinal stenosis of lumbar region with neurogenic claudication     Plan: No additional findings.   Meds & Orders:  Meds ordered this encounter  Medications  . methylPREDNISolone acetate (DEPO-MEDROL) injection 80 mg    Orders Placed This Encounter  Procedures  . XR C-ARM NO REPORT  . Epidural Steroid injection    Follow-up: Return for Basil Dess, MD as scheduled.   Procedures: No procedures performed  Lumbar Epidural Steroid Injection - Interlaminar Approach with Fluoroscopic Guidance  Patient: Linda Black      Date of Birth: Mar 21, 1971 MRN: [id number] PCP: Carlena Hurl, PA-C      Visit Date: 01/19/2020   Universal Protocol:     Consent Given By: the patient  Position: PRONE  Additional Comments: Vital signs were monitored before and after the procedure. Patient was prepped and draped in the usual sterile fashion. The correct patient, procedure, and site was verified.   Injection Procedure Details:  Procedure Site One Meds Administered:  Meds ordered this encounter  Medications  .  methylPREDNISolone acetate (DEPO-MEDROL) injection 80 mg     Laterality: Right  Location/Site:  L4-L5  Needle size: 20 G  Needle type: Tuohy  Needle Placement: Paramedian epidural  Findings:   -Comments: Excellent flow of contrast into the epidural space.  Procedure Details: Using a paramedian approach from the side mentioned above, the region overlying the inferior lamina was localized under fluoroscopic visualization and the soft tissues overlying this structure were infiltrated with 4 ml. of 1% Lidocaine without Epinephrine. The Tuohy needle was inserted into the epidural space using a paramedian approach.   The epidural space was localized using loss of resistance along with lateral and bi-planar fluoroscopic views.  After negative aspirate for air, blood, and CSF, a 2 ml. volume of Isovue-250 was injected into the epidural space and the flow of contrast was observed. Radiographs were obtained for documentation purposes.    The injectate was administered into the level noted above.   Additional Comments:  No complications occurred Dressing: 2 x 2 sterile gauze and Band-Aid    Post-procedure details: Patient was observed during the procedure. Post-procedure instructions were reviewed.  Patient left the clinic in stable condition.     Clinical History: MRI LUMBAR SPINE WITHOUT CONTRAST  TECHNIQUE: Multiplanar, multisequence MR imaging of the lumbar spine was performed. No intravenous contrast was administered.  COMPARISON:  None.  FINDINGS: Segmentation:  Standard.  Alignment:  No significant listhesis.  Vertebrae: Vertebral body heights are maintained. There is no significant marrow edema or suspicious osseous lesion identified  Conus medullaris  and cauda equina: Conus extends to the L1 level. Conus and cauda equina appear normal.  Paraspinal and other soft tissues: Unremarkable.  Disc levels:  L1-L2:  No significant canal or foraminal  stenosis.  L2-L3:  No significant canal or foraminal stenosis.  L3-L4: Disc bulge and facet arthropathy with ligamentum flavum infolding. Mild canal stenosis. No significant foraminal stenosis.  L4-L5: Disc bulge with superimposed central disc protrusion and annular fissure. Endplate osteophytic ridging and facet arthropathy with ligamentum flavum infolding. Moderate canal stenosis with narrowing of lateral recesses, left greater than right. Mild to moderate foraminal stenosis, left greater than right.  L5-S1: Disc bulge with right subarticular annular fissure and facet arthropathy with ligamentum flavum infolding. No significant canal stenosis with partial effacement of the right lateral recess. Minor foraminal stenosis.  IMPRESSION: Multilevel degenerative changes as detailed above, greatest at L4-L5 and L5-S1.   Electronically Signed   By: Macy Mis M.D.   On: 08/24/2019 09:25     Objective:  VS:  HT:    WT:   BMI:     BP:133/87  HR:67bpm  TEMP: ( )  RESP:  Physical Exam Constitutional:      General: She is not in acute distress.    Appearance: Normal appearance. She is not ill-appearing.  HENT:     Head: Normocephalic and atraumatic.     Right Ear: External ear normal.     Left Ear: External ear normal.  Eyes:     Extraocular Movements: Extraocular movements intact.  Cardiovascular:     Rate and Rhythm: Normal rate.     Pulses: Normal pulses.  Musculoskeletal:     Right lower leg: No edema.     Left lower leg: No edema.     Comments: Patient has good distal strength with no pain over the greater trochanters.  No clonus or focal weakness.  Skin:    Findings: No erythema, lesion or rash.  Neurological:     General: No focal deficit present.     Mental Status: She is alert and oriented to person, place, and time.     Sensory: No sensory deficit.     Motor: No weakness or abnormal muscle tone.     Coordination: Coordination normal.    Psychiatric:        Mood and Affect: Mood normal.        Behavior: Behavior normal.      Imaging: XR C-ARM NO REPORT  Result Date: 01/19/2020 Please see Notes tab for imaging impression.

## 2020-01-19 NOTE — Progress Notes (Signed)
Pt states pain in the lower back and radiates into both legs all the way down. Pt states last injection 12/12/19 helped out a lot. Pt states sitting makes pain better. Any activity makes pain worse  .Numeric Pain Rating Scale and Functional Assessment Average Pain 6   In the last MONTH (on 0-10 scale) has pain interfered with the following?  1. General activity like being  able to carry out your everyday physical activities such as walking, climbing stairs, carrying groceries, or moving a chair?  Rating(9)   +Driver, -BT, -Dye Allergies.

## 2020-01-19 NOTE — Procedures (Signed)
Lumbar Epidural Steroid Injection - Interlaminar Approach with Fluoroscopic Guidance  Patient: Linda Black      Date of Birth: August 11, 1970 MRN: [id number] PCP: Carlena Hurl, PA-C      Visit Date: 01/19/2020   Universal Protocol:     Consent Given By: the patient  Position: PRONE  Additional Comments: Vital signs were monitored before and after the procedure. Patient was prepped and draped in the usual sterile fashion. The correct patient, procedure, and site was verified.   Injection Procedure Details:  Procedure Site One Meds Administered:  Meds ordered this encounter  Medications  . methylPREDNISolone acetate (DEPO-MEDROL) injection 80 mg     Laterality: Right  Location/Site:  L4-L5  Needle size: 20 G  Needle type: Tuohy  Needle Placement: Paramedian epidural  Findings:   -Comments: Excellent flow of contrast into the epidural space.  Procedure Details: Using a paramedian approach from the side mentioned above, the region overlying the inferior lamina was localized under fluoroscopic visualization and the soft tissues overlying this structure were infiltrated with 4 ml. of 1% Lidocaine without Epinephrine. The Tuohy needle was inserted into the epidural space using a paramedian approach.   The epidural space was localized using loss of resistance along with lateral and bi-planar fluoroscopic views.  After negative aspirate for air, blood, and CSF, a 2 ml. volume of Isovue-250 was injected into the epidural space and the flow of contrast was observed. Radiographs were obtained for documentation purposes.    The injectate was administered into the level noted above.   Additional Comments:  No complications occurred Dressing: 2 x 2 sterile gauze and Band-Aid    Post-procedure details: Patient was observed during the procedure. Post-procedure instructions were reviewed.  Patient left the clinic in stable condition.

## 2020-01-23 ENCOUNTER — Ambulatory Visit: Payer: Medicaid Other | Admitting: Physical Therapy

## 2020-01-23 ENCOUNTER — Other Ambulatory Visit: Payer: Self-pay

## 2020-01-23 ENCOUNTER — Encounter: Payer: Self-pay | Admitting: Physical Therapy

## 2020-01-23 DIAGNOSIS — G8929 Other chronic pain: Secondary | ICD-10-CM

## 2020-01-23 DIAGNOSIS — R531 Weakness: Secondary | ICD-10-CM

## 2020-01-23 DIAGNOSIS — R262 Difficulty in walking, not elsewhere classified: Secondary | ICD-10-CM

## 2020-01-23 DIAGNOSIS — M545 Low back pain, unspecified: Secondary | ICD-10-CM

## 2020-01-23 DIAGNOSIS — M62830 Muscle spasm of back: Secondary | ICD-10-CM

## 2020-01-23 DIAGNOSIS — M25561 Pain in right knee: Secondary | ICD-10-CM | POA: Diagnosis not present

## 2020-01-23 NOTE — Therapy (Signed)
[place], [name], [address] Phone: [phone]   Fax:  [phone]  Physical Therapy Treatment  Patient Details  Name: Linda Black MRN: [id number] Date of Birth: [birth year] Referring Provider (PT): Louanne Skye   Encounter Date: 01/23/2020   PT End of Session - 01/23/20 1014    Visit Number 2    Number of Visits 4    Date for PT Re-Evaluation 02/10/20    Authorization Type MCD request 3 initial visits    PT Start Time [phone]   pt arrived late   PT Stop Time 1    PT Time Calculation (min) 36 min    Activity Tolerance Patient tolerated treatment well    Behavior During Therapy [facility] for tasks assessed/performed           Past Medical History:  Diagnosis Date   Diabetes mellitus without complication ([place])    Dr. Kelton Pillar   Fatty liver 2019   elevated LFTs, negative for Hep B and Hep C 04/2018   GERD (gastroesophageal reflux disease)    Hyperlipidemia    Hypertension    Hypothyroidism 2019   IgA nephropathy 2019   per biopsy, Dr. Corliss Parish   Language barrier    Lumbar stenosis    Dr. Louanne Skye   Obesity    Osteoarthritis    Proteinuria     Past Surgical History:  Procedure Laterality Date   RENAL BIOPSY  2018   IgA nephropathy, Dr. Corliss Parish    There were no vitals filed for this visit.   Subjective Assessment - 01/23/20 0942    Subjective Pt reports R knee is a little swollen today and both knee/back are painful    Patient is accompained by: Interpreter    Currently in Pain? Yes    Pain Score 5     Pain Location Knee    Pain Orientation Right    Multiple Pain Sites Yes    Pain Score 5    Pain Location Back    Pain Orientation Lower                             [facility] Adult PT Treatment/Exercise - 01/23/20 0001      Exercises   Exercises Knee/Hip      Lumbar Exercises: Aerobic   Nustep L3 x 6 min      Knee/Hip Exercises:  Machines for Strengthening   Cybex Knee Extension 20# 2x10    Cybex Knee Flexion 20# 2x10    Other Machine rows and lats 20# 2x15; standing shoulder ext 2x10 10#      Knee/Hip Exercises: Standing   Forward Step Up Both;2 sets;10 reps;Step Height: 6"      Knee/Hip Exercises: Seated   Hamstring Curl Right;1 set;10 reps    Hamstring Limitations red TB    Sit to Sand 2 sets;10 reps;without UE support                       PT Long Term Goals - 01/13/20 5852      PT LONG TERM GOAL #1   Title I with HEP    Time 8    Period Weeks      PT LONG TERM GOAL #2   Title increase right knee AROM to 0-110 degrees flexion    Time 8    Period Weeks    Status New  PT LONG TERM GOAL #3   Title increase lumbar ROM 25%    Time 8    Period Weeks    Status New      PT LONG TERM GOAL #4   Title report =/> 75% reduction of overall pain    Time 8    Period Weeks    Status New      PT LONG TERM GOAL #5   Title report able to walk 20 minutes without pain >4/10    Time 8    Period Weeks    Status New                 Plan - 01/23/20 1015    Clinical Impression Statement Pt arrived late. Pt tolerated initial progression to TE well. Pt is reporting equal amts of pain in knee and LB today; states that she is still having some swelling when standing for prolonged periods. Required postural cues for most ex's. Continue to progress ROM and function.    PT Treatment/Interventions Taping;Iontophoresis 4mg /ml Dexamethasone;Patient/family education;Functional mobility training;Moist Heat;Ultrasound;Traction;Therapeutic activities;Passive range of motion;Therapeutic exercise;Cryotherapy;Electrical Stimulation;Balance training;Manual techniques;Dry needling;Spinal Manipulations;Joint Manipulations;ADLs/Self Care Home Management;Gait training;Stair training    PT Next Visit Plan patient reports that we have helped her back pain some in the past, she is c/o more right knee pain.  Will  try ionto, could try taping    Consulted and Agree with Plan of Care Patient           Patient will benefit from skilled therapeutic intervention in order to improve the following deficits and impairments:  Decreased range of motion, Obesity, Increased muscle spasms, Decreased knowledge of precautions, Pain, Decreased strength, Abnormal gait, Difficulty walking, Impaired flexibility, Decreased mobility, Increased edema  Visit Diagnosis: Chronic pain of right knee  Chronic bilateral low back pain without sciatica  General weakness  Muscle spasm of back  Difficulty in walking, not elsewhere classified     Problem List Patient Active Problem List   Diagnosis Date Noted   Encounter for health maintenance examination in adult 01/09/2020   BMI 35.0-35.9,adult 01/09/2020   Radicular pain of right lower extremity 04/18/2019   Other intervertebral disc degeneration, lumbar region 04/18/2019   Radiculopathy of lumbosacral region 03/21/2019   Right leg pain 03/21/2019   Chest pain 09/23/2018   Neuropathy involving both lower extremities 09/23/2018   Screen for colon cancer 05/17/2018   Vaccine counseling 05/17/2018   Fatty liver 05/17/2018   Screening for cervical cancer 05/17/2018   Hypothyroidism 05/17/2018   Need for pneumococcal vaccination 05/17/2018   Generalized abdominal tenderness 05/17/2018   Need for influenza vaccination 04/13/2018   Other fatigue 02/08/2018   Edema 01/25/2018   Renal failure (ARF), acute on chronic (HCC) 01/20/2018   Normocytic normochromic anemia 01/20/2018   Tremor 01/05/2018   Vitamin D deficiency 01/05/2018   RUQ abdominal pain 10/27/2017   Hemorrhoids 10/27/2017   High risk medication use 09/25/2017   Type 2 diabetes mellitus with neurological complications ([place]) 45/62/5638   Proteinuria 09/02/2017   Paresthesia 09/02/2017   IgA nephropathy 06/10/2017   Language barrier 06/10/2017   Gastroesophageal reflux  disease 06/10/2017   SOB (shortness of breath) 02/24/2017   Hyperlipidemia 04/21/2016   Chronic right-sided low back pain with right-sided sciatica 01/29/2016   Morbid obesity ([place]) 01/29/2016   Hypokalemia 01/29/2016   Essential hypertension 01/29/2016   Amador Cunas, PT, DPT Donald Prose J[place] Hanf 01/23/2020, 10:17 AM  [place]  [place], [name], [address] Phone: [phone]   Fax:  [phone]  Name: RONNITA PAZ MRN: [id number] Date of Birth: May 12, 1971

## 2020-01-29 ENCOUNTER — Other Ambulatory Visit: Payer: Self-pay | Admitting: Specialist

## 2020-01-30 ENCOUNTER — Encounter: Payer: Self-pay | Admitting: Physical Therapy

## 2020-01-30 ENCOUNTER — Other Ambulatory Visit: Payer: Self-pay

## 2020-01-30 ENCOUNTER — Ambulatory Visit: Payer: Medicaid Other | Admitting: Physical Therapy

## 2020-01-30 DIAGNOSIS — G8929 Other chronic pain: Secondary | ICD-10-CM

## 2020-01-30 DIAGNOSIS — M25561 Pain in right knee: Secondary | ICD-10-CM

## 2020-01-30 DIAGNOSIS — R531 Weakness: Secondary | ICD-10-CM

## 2020-01-30 DIAGNOSIS — M62830 Muscle spasm of back: Secondary | ICD-10-CM

## 2020-01-30 NOTE — Therapy (Signed)
[place] [facility] [place], [name], [address] Phone: [phone]   Fax:  [phone]  Physical Therapy Treatment  Patient Details  Name: Linda Black MRN: [id number] Date of Birth: 1971-06-30 Referring Provider (PT): Louanne Skye   Encounter Date: 01/30/2020   PT End of Session - 01/30/20 0927    Visit Number 3    Number of Visits 4    Authorization Type MCD request 3 initial visits    PT Start Time 0845    PT Stop Time 0928    PT Time Calculation (min) 43 min    Activity Tolerance Patient tolerated treatment well    Behavior During Therapy [facility] for tasks assessed/performed           Past Medical History:  Diagnosis Date  . Diabetes mellitus without complication (HCC)    Dr. Kelton Pillar  . Fatty liver 2019   elevated LFTs, negative for Hep B and Hep C 04/2018  . GERD (gastroesophageal reflux disease)   . Hyperlipidemia   . Hypertension   . Hypothyroidism 2019  . IgA nephropathy 2019   per biopsy, Dr. Corliss Parish  . Language barrier   . Lumbar stenosis    Dr. Louanne Skye  . Obesity   . Osteoarthritis   . Proteinuria     Past Surgical History:  Procedure Laterality Date  . RENAL BIOPSY  2018   IgA nephropathy, Dr. Corliss Parish    There were no vitals filed for this visit.   Subjective Assessment - 01/30/20 0846    Subjective Good, still pain but better in knee and lower back. Today's her knee is not swollen    Currently in Pain? Yes    Pain Score 5     Pain Location --   R knee and Low back.             [facility] PT Assessment - 01/30/20 0001      AROM   Right Knee Extension 0    Right Knee Flexion 110                         [facility] Adult PT Treatment/Exercise - 01/30/20 0001      Lumbar Exercises: Aerobic   UBE (Upper Arm Bike) L3 x 2 min each      Lumbar Exercises: Standing   Shoulder Extension 20 reps;Power Tower;Strengthening;Both   yellow weighted ball    Shoulder Extension Limitations 5    Other Standing Lumbar Exercises standing overhead extentions 2x10 with yellow weighted ball      Knee/Hip Exercises: Machines for Strengthening   Other Machine rows and lats 20# 2x15; standing shoulder ext 2x10 10#      Knee/Hip Exercises: Standing   Forward Step Up 1 set;10 reps;Step Height: 6"      Knee/Hip Exercises: Seated   Sit to Sand 2 sets;10 reps;without UE support   lowered UBE seat                       PT Long Term Goals - 01/30/20 0903      PT LONG TERM GOAL #2   Title increase right knee AROM to 0-110 degrees flexion    Status Achieved                 Plan - 01/30/20 0928    Clinical Impression Statement Pt enters clinic reporting some improvement overall. She has progressed  increasing her R knee AROM. Postural cues given throughout session. Core weakness present with standing shoulder extension. Cues to complete full ROM with seated HS curls.    Personal Factors and Comorbidities Comorbidity 3+;Behavior Pattern;Past/Current Experience;Social Background    Comorbidities DM, HTN, obesity    Examination-Activity Limitations Bathing;Carry;Lift    Examination-Participation Restrictions Laundry;Other;Yard Work;Community Activity;Cleaning    Stability/Clinical Decision Making Evolving/Moderate complexity    Rehab Potential Fair    PT Frequency 1x / week    PT Duration 8 weeks    PT Treatment/Interventions Taping;Iontophoresis 4mg /ml Dexamethasone;Patient/family education;Functional mobility training;Moist Heat;Ultrasound;Traction;Therapeutic activities;Passive range of motion;Therapeutic exercise;Cryotherapy;Electrical Stimulation;Balance training;Manual techniques;Dry needling;Spinal Manipulations;Joint Manipulations;ADLs/Self Care Home Management;Gait training;Stair training    PT Next Visit Plan patient reports that we have helped her back pain some in the past, she is c/o more right knee pain.  Will try ionto, could  try taping           Patient will benefit from skilled therapeutic intervention in order to improve the following deficits and impairments:  Decreased range of motion, Obesity, Increased muscle spasms, Decreased knowledge of precautions, Pain, Decreased strength, Abnormal gait, Difficulty walking, Impaired flexibility, Decreased mobility, Increased edema  Visit Diagnosis: Chronic pain of right knee  General weakness  Chronic bilateral low back pain without sciatica  Muscle spasm of back     Problem List Patient Active Problem List   Diagnosis Date Noted  . Encounter for health maintenance examination in adult 01/09/2020  . BMI 35.0-35.9,adult 01/09/2020  . Radicular pain of right lower extremity 04/18/2019  . Other intervertebral disc degeneration, lumbar region 04/18/2019  . Radiculopathy of lumbosacral region 03/21/2019  . Right leg pain 03/21/2019  . Chest pain 09/23/2018  . Neuropathy involving both lower extremities 09/23/2018  . Screen for colon cancer 05/17/2018  . Vaccine counseling 05/17/2018  . Fatty liver 05/17/2018  . Screening for cervical cancer 05/17/2018  . Hypothyroidism 05/17/2018  . Need for pneumococcal vaccination 05/17/2018  . Generalized abdominal tenderness 05/17/2018  . Need for influenza vaccination 04/13/2018  . Other fatigue 02/08/2018  . Edema 01/25/2018  . Renal failure (ARF), acute on chronic (HCC) 01/20/2018  . Normocytic normochromic anemia 01/20/2018  . Tremor 01/05/2018  . Vitamin D deficiency 01/05/2018  . RUQ abdominal pain 10/27/2017  . Hemorrhoids 10/27/2017  . High risk medication use 09/25/2017  . Type 2 diabetes mellitus with neurological complications ([place]) 26/94/8546  . Proteinuria 09/02/2017  . Paresthesia 09/02/2017  . IgA nephropathy 06/10/2017  . Language barrier 06/10/2017  . Gastroesophageal reflux disease 06/10/2017  . SOB (shortness of breath) 02/24/2017  . Hyperlipidemia 04/21/2016  . Chronic right-sided low  back pain with right-sided sciatica 01/29/2016  . Morbid obesity ([place]) 01/29/2016  . Hypokalemia 01/29/2016  . Essential hypertension 01/29/2016    Scot Jun, PTA 01/30/2020, 9:33 AM  [place], [name], [address] Phone: [phone]   Fax:  [phone]  Name: MIKIALA FUGETT MRN: [id number] Date of Birth: Mar 01, 1971

## 2020-02-01 ENCOUNTER — Ambulatory Visit: Payer: Medicaid Other | Admitting: Specialist

## 2020-02-08 ENCOUNTER — Ambulatory Visit: Payer: Medicaid Other | Attending: Specialist | Admitting: Physical Therapy

## 2020-02-08 ENCOUNTER — Other Ambulatory Visit: Payer: Self-pay

## 2020-02-08 DIAGNOSIS — M79604 Pain in right leg: Secondary | ICD-10-CM

## 2020-02-08 DIAGNOSIS — M62830 Muscle spasm of back: Secondary | ICD-10-CM | POA: Diagnosis present

## 2020-02-08 DIAGNOSIS — R531 Weakness: Secondary | ICD-10-CM | POA: Diagnosis present

## 2020-02-08 DIAGNOSIS — M545 Low back pain, unspecified: Secondary | ICD-10-CM

## 2020-02-08 DIAGNOSIS — M25561 Pain in right knee: Secondary | ICD-10-CM | POA: Insufficient documentation

## 2020-02-08 DIAGNOSIS — G8929 Other chronic pain: Secondary | ICD-10-CM

## 2020-02-08 DIAGNOSIS — R262 Difficulty in walking, not elsewhere classified: Secondary | ICD-10-CM

## 2020-02-08 NOTE — Therapy (Signed)
[place], [name], [address] Phone: [phone]   Fax:  [phone]  Physical Therapy Treatment  Patient Details  Name: Linda Black MRN: [id number] Date of Birth: 1971-07-21 Referring Provider (PT): Louanne Skye   Encounter Date: 02/08/2020   PT End of Session - 02/08/20 0934    PT Start Time 0845    PT Stop Time 0925    PT Time Calculation (min) 40 min           Past Medical History:  Diagnosis Date  . Diabetes mellitus without complication (HCC)    Dr. Kelton Pillar  . Fatty liver 2019   elevated LFTs, negative for Hep B and Hep C 04/2018  . GERD (gastroesophageal reflux disease)   . Hyperlipidemia   . Hypertension   . Hypothyroidism 2019  . IgA nephropathy 2019   per biopsy, Dr. Corliss Parish  . Language barrier   . Lumbar stenosis    Dr. Louanne Skye  . Obesity   . Osteoarthritis   . Proteinuria     Past Surgical History:  Procedure Laterality Date  . RENAL BIOPSY  2018   IgA nephropathy, Dr. Corliss Parish    There were no vitals filed for this visit.   Subjective Assessment - 02/08/20 0846    Subjective Patient reports that every night she wakes up from cramps in BIL calves. During the day she does lots of walking which helps eleviate discomfort.    Patient is accompained by: Interpreter    Pertinent History Had PT for this prior - she has stopped doing these because they hurt    Currently in Pain? Yes    Pain Score 5     Pain Location Knee    Pain Orientation Right                             [facility] Adult PT Treatment/Exercise - 02/08/20 0001      Lumbar Exercises: Aerobic   Nustep L5 x 56mn      Lumbar Exercises: Machines for Strengthening   Other Lumbar Machine Exercise lat pull down & rows 20# 2x15 w. 2 second hold    Other Lumbar Machine Exercise back extension black Tband 2 x15      Lumbar Exercises: Supine   Dead Bug 10 reps;Other (comment)    x2 LEs static hold and BIL UE alternating      Knee/Hip Exercises: Standing   Step Down Left;Right;10 reps;Step Height: 6"    Step Down Limitations increasing pain on R knee      Knee/Hip Exercises: Supine   Bridges Left;Right;Strengthening;2 sets;15 reps    Other Supine Knee/Hip Exercises clam shells red Tband 2 x15      Manual Therapy   Manual Therapy Passive ROM    Passive ROM gastroc                       PT Long Term Goals - 02/08/20 0932      PT LONG TERM GOAL #1   Title I with HEP    Status Achieved      PT LONG TERM GOAL #2   Title increase right knee AROM to 0-110 degrees flexion    Status Achieved      PT LONG TERM GOAL #3   Title increase lumbar ROM 25%    Status On-going      PT  LONG TERM GOAL #4   Title report =/> 75% reduction of overall pain    Status Partially Met      PT LONG TERM GOAL #5   Status Partially Met                 Plan - 02/08/20 0922    Clinical Impression Statement Patient tolerated new TE well. Patient demonstrated increasing pain in R knee during eccentric control during lateral step downs. Patient given instruction on gastroc stretching and hydration to counteract nightly cramps. With this being the last session, patient reports being pleased with current functional status and would like to be discharged at this time.    Personal Factors and Comorbidities Comorbidity 3+;Behavior Pattern;Past/Current Experience;Social Background    Comorbidities DM, HTN, obesity    Examination-Activity Limitations Bathing;Carry;Lift    Examination-Participation Restrictions Laundry;Other;Yard Work;Community Activity;Cleaning    Rehab Potential Fair    PT Frequency 1x / week    PT Duration 8 weeks    PT Treatment/Interventions Taping;Iontophoresis 1m/ml Dexamethasone;Patient/family education;Functional mobility training;Moist Heat;Ultrasound;Traction;Therapeutic activities;Passive range of motion;Therapeutic  exercise;Cryotherapy;Electrical Stimulation;Balance training;Manual techniques;Dry needling;Spinal Manipulations;Joint Manipulations;ADLs/Self Care Home Management;Gait training;Stair training    Consulted and Agree with Plan of Care Patient           Patient will benefit from skilled therapeutic intervention in order to improve the following deficits and impairments:  Decreased range of motion, Obesity, Increased muscle spasms, Decreased knowledge of precautions, Pain, Decreased strength, Abnormal gait, Difficulty walking, Impaired flexibility, Decreased mobility, Increased edema  Visit Diagnosis: Chronic pain of right knee  General weakness  Chronic bilateral low back pain without sciatica  Muscle spasm of back  Difficulty in walking, not elsewhere classified  Acute right-sided low back pain, unspecified whether sciatica present  Pain in right leg     Problem List Patient Active Problem List   Diagnosis Date Noted  . Encounter for health maintenance examination in adult 01/09/2020  . BMI 35.0-35.9,adult 01/09/2020  . Radicular pain of right lower extremity 04/18/2019  . Other intervertebral disc degeneration, lumbar region 04/18/2019  . Radiculopathy of lumbosacral region 03/21/2019  . Right leg pain 03/21/2019  . Chest pain 09/23/2018  . Neuropathy involving both lower extremities 09/23/2018  . Screen for colon cancer 05/17/2018  . Vaccine counseling 05/17/2018  . Fatty liver 05/17/2018  . Screening for cervical cancer 05/17/2018  . Hypothyroidism 05/17/2018  . Need for pneumococcal vaccination 05/17/2018  . Generalized abdominal tenderness 05/17/2018  . Need for influenza vaccination 04/13/2018  . Other fatigue 02/08/2018  . Edema 01/25/2018  . Renal failure (ARF), acute on chronic (HCC) 01/20/2018  . Normocytic normochromic anemia 01/20/2018  . Tremor 01/05/2018  . Vitamin D deficiency 01/05/2018  . RUQ abdominal pain 10/27/2017  . Hemorrhoids 10/27/2017  .  High risk medication use 09/25/2017  . Type 2 diabetes mellitus with neurological complications ([place] 032/44/0102 . Proteinuria 09/02/2017  . Paresthesia 09/02/2017  . IgA nephropathy 06/10/2017  . Language barrier 06/10/2017  . Gastroesophageal reflux disease 06/10/2017  . SOB (shortness of breath) 02/24/2017  . Hyperlipidemia 04/21/2016  . Chronic right-sided low back pain with right-sided sciatica 01/29/2016  . Morbid obesity ([place] 01/29/2016  . Hypokalemia 01/29/2016  . Essential hypertension 01/29/2016   PHYSICAL THERAPY DISCHARGE SUMMARY  Visits from Start of Care:4  Plan: Patient agrees to discharge.  Patient goals were partially met. Patient is being discharged due to being pleased with the current functional level.  ?????     EYork Cerise SPTA  02/08/2020, 9:35 AM  [place], [name], [address] Phone: [phone]   Fax:  [phone]  Name: DHIYA SMITS MRN: [id number] Date of Birth: 01-Dec-1970

## 2020-02-17 ENCOUNTER — Other Ambulatory Visit: Payer: Self-pay | Admitting: Specialist

## 2020-02-17 NOTE — Telephone Encounter (Signed)
Pls advise. Thanks.  

## 2020-02-20 ENCOUNTER — Telehealth: Payer: Self-pay | Admitting: Specialist

## 2020-02-20 NOTE — Telephone Encounter (Signed)
Pt would like a refill of tramadol and a CB in regards to getting a certain number of refills approved ahead of time so she doesn't have to go through authorizations each time.  [phone]

## 2020-02-20 NOTE — Telephone Encounter (Signed)
Pls advise.  

## 2020-02-21 ENCOUNTER — Other Ambulatory Visit: Payer: Self-pay | Admitting: Surgery

## 2020-02-21 ENCOUNTER — Other Ambulatory Visit: Payer: Medicaid Other

## 2020-02-21 MED ORDER — TRAMADOL HCL 50 MG PO TABS: 50.0000 mg | ORAL_TABLET | Freq: Three times a day (TID) | ORAL | 0 refills | Status: DC | PRN

## 2020-02-21 NOTE — Telephone Encounter (Signed)
Refilled Ultram

## 2020-02-22 ENCOUNTER — Ambulatory Visit: Payer: Medicaid Other | Admitting: Specialist

## 2020-03-01 ENCOUNTER — Ambulatory Visit: Payer: Self-pay

## 2020-03-01 ENCOUNTER — Encounter: Payer: Self-pay | Admitting: Specialist

## 2020-03-01 ENCOUNTER — Ambulatory Visit (INDEPENDENT_AMBULATORY_CARE_PROVIDER_SITE_OTHER): Payer: Medicaid Other | Admitting: Specialist

## 2020-03-01 ENCOUNTER — Other Ambulatory Visit: Payer: Self-pay

## 2020-03-01 VITALS — BP 129/83 | HR 65 | Ht 61.0 in | Wt 191.0 lb

## 2020-03-01 DIAGNOSIS — M222X1 Patellofemoral disorders, right knee: Secondary | ICD-10-CM | POA: Diagnosis not present

## 2020-03-01 DIAGNOSIS — M25561 Pain in right knee: Secondary | ICD-10-CM

## 2020-03-01 DIAGNOSIS — M2391 Unspecified internal derangement of right knee: Secondary | ICD-10-CM | POA: Diagnosis not present

## 2020-03-01 DIAGNOSIS — M4316 Spondylolisthesis, lumbar region: Secondary | ICD-10-CM

## 2020-03-01 DIAGNOSIS — M1711 Unilateral primary osteoarthritis, right knee: Secondary | ICD-10-CM | POA: Diagnosis not present

## 2020-03-01 DIAGNOSIS — N183 Chronic kidney disease, stage 3 unspecified: Secondary | ICD-10-CM

## 2020-03-01 MED ORDER — TRAMADOL HCL 50 MG PO TABS: ORAL_TABLET | ORAL | 0 refills | Status: DC

## 2020-03-01 NOTE — Progress Notes (Signed)
Office Visit Note   Patient: Linda Black           Date of Birth: Mar 10, 1971           MRN: [id number] Visit Date: 03/01/2020              Requested by: Carlena Hurl, PA-C [address] [place],  [place] [address] PCP: Carlena Hurl, PA-C   Assessment & Plan: Visit Diagnoses:  1. Spondylolisthesis of lumbar region   2. Patellofemoral pain syndrome of right knee   3. Unilateral primary osteoarthritis, right knee   4. Internal derangement of multiple sites of right knee   5. Acute pain of right knee   6. Stage 3 chronic kidney disease, unspecified whether stage 3a or 3b CKD    Right knee arthritis is worsening and she has an effusion, injection only temporized the pain and she has performed PT. Clinically effusion, painful ROM right knee, Unexpected accelerated arthrosis of the right knee recommend that we petition her insurance for authorization for right knee MRI as her  Major source of pain is now right knee with obvious clinical signs of mechanical derrangement. Plain  Radiographs with moderate-severe joint line narrowing new since one year ago.   Plan: Plan: Knee is suffering from osteoarthritis, only real proven treatments are Weight loss,and exercise. Well padded shoes help. Ice the knee that is suffering from osteoarthritis, only real proven treatments are Well padded shoes help. Ice the knee 2-3 times a day 15-20 mins at a time.-3 times a day 15-20 mins at a time. Hot showers in the AM.  You can not take arthritis medications like NSAIDs due to renal dysfunction Injection with steroid may be of benefit. Hemp CBD capsules, amazon.com 5,000-7,000 mg per bottle, 60 capsules per bottle, take one capsule twice a day. Cane in the right hand to use with right leg weight bearing. Recommend MRI of the right knee to assess for large torn meniscus as a cause for worsening right knee arthrosis changes or inflamatory synovitis. Will have you see Dr. Marlou Sa for evaluation of  your right knee due to accelerated arthritis, may need  Arthroscopic surgery. MRI ordered.  Follow-Up Instructions: No follow-ups on file.   Follow-Up Instructions: Return in about 3 weeks (around 03/22/2020), or See me in one months, for Appointment with Dr. Marlou Sa for right knee accelerated arthrosis, probable meniscal tear MRI ordered. .   Orders:  Orders Placed This Encounter  Procedures  . XR Knee 1-2 Views Right  . MR Knee Right w/o contrast   Meds ordered this encounter  Medications  . traMADol (ULTRAM) 50 MG tablet    Sig: TAKE 2 TABLETS BY MOUTH EVERY 6 HOURS FOR UP TO 7 DAYS AS NEEDED    Dispense:  30 tablet    Refill:  0      Procedures: No procedures performed   Clinical Data: No additional findings.   Subjective: Chief Complaint  Patient presents with  . Right Knee - Follow-up    Insurance denied MRI of right knee--she has been to physical therapy, she states that it has not helped her, she states that if feels worse now than before PT.   Marland Kitchen Lower Back - Follow-up    49 year old female with spondylolisthesis of the lumbar spine and difficulty with standing and walking and experiences sciatica into the right leg laterally along the lateral  Right calf and into the right great toe. Pain is improved with sitting and  bending and is not leaning on the grocery cart.  Sitting is better than standing and walking. The right leg is weak with burning pain into the right  Upper medial buttock and small of the back right side. Bowel and bladder function is normal. She has been to PT now for nearly one month for both lumbar spondylolisthesis and for right knee internal derrangement. She reports having to take tramadol for both the back and the right knee pain. Presently the right knee is painful and she is having difficulty with standing and walking mainly due to the right knee pain and inability to bend the right knee. Swelling is minimally improved for the right knee. The right  knee pops and clicks with straightening and with walking.    Review of Systems  Constitutional: Positive for activity change.  HENT: Negative.   Eyes: Negative.   Respiratory: Negative.   Cardiovascular: Negative.   Gastrointestinal: Negative.   Endocrine: Negative.   Genitourinary: Negative.   Musculoskeletal: Positive for arthralgias, back pain, gait problem, joint swelling and myalgias.  Skin: Negative.   Allergic/Immunologic: Negative.   Neurological: Positive for weakness and numbness.  Hematological: Negative.   Psychiatric/Behavioral: Negative.      Objective: Vital Signs: BP (!) 129/83 (BP Location: Left Arm, Patient Position: Sitting)   Pulse 65   Ht 5\' 1"  (1.549 m)   Wt 191 lb (86.6 kg)   LMP 03/23/2017   BMI 36.09 kg/m   Physical Exam Constitutional:      Appearance: She is well-developed.  HENT:     Head: Normocephalic and atraumatic.  Eyes:     Pupils: Pupils are equal, round, and reactive to light.  Pulmonary:     Effort: Pulmonary effort is normal.     Breath sounds: Normal breath sounds.  Abdominal:     General: Bowel sounds are normal.     Palpations: Abdomen is soft.  Musculoskeletal:        General: Normal range of motion.     Cervical back: Normal range of motion and neck supple.  Skin:    General: Skin is warm and dry.  Neurological:     Mental Status: She is alert and oriented to person, place, and time.  Psychiatric:        Behavior: Behavior normal.        Thought Content: Thought content normal.        Judgment: Judgment normal.     Right Knee Exam   Muscle Strength  The patient has normal right knee strength.  Tenderness  The patient is experiencing tenderness in the medial joint line and lateral retinaculum.  Comments:  Right knee circumference 1 inch greater than left.  Ballotment sign is positive right knee.        Specialty Comments:  No specialty comments available.  Imaging: XR Knee 1-2 Views Right  Result  Date: 03/01/2020 Right knee radiographs today compared with radiographs from one year ago show a worsening of right medial joint line narrowing with subchondral sclerosis of the right medial tibial plateau, there is medial subluxation of the right femur on the tibia by 2-3 mm.     PMFS History: Patient Active Problem List   Diagnosis Date Noted  . Encounter for health maintenance examination in adult 01/09/2020  . BMI 35.0-35.9,adult 01/09/2020  . Radicular pain of right lower extremity 04/18/2019  . Other intervertebral disc degeneration, lumbar region 04/18/2019  . Radiculopathy of lumbosacral region 03/21/2019  . Right leg pain 03/21/2019  .  Chest pain 09/23/2018  . Neuropathy involving both lower extremities 09/23/2018  . Screen for colon cancer 05/17/2018  . Vaccine counseling 05/17/2018  . Fatty liver 05/17/2018  . Screening for cervical cancer 05/17/2018  . Hypothyroidism 05/17/2018  . Need for pneumococcal vaccination 05/17/2018  . Generalized abdominal tenderness 05/17/2018  . Need for influenza vaccination 04/13/2018  . Other fatigue 02/08/2018  . Edema 01/25/2018  . Renal failure (ARF), acute on chronic (HCC) 01/20/2018  . Normocytic normochromic anemia 01/20/2018  . Tremor 01/05/2018  . Vitamin D deficiency 01/05/2018  . RUQ abdominal pain 10/27/2017  . Hemorrhoids 10/27/2017  . High risk medication use 09/25/2017  . Type 2 diabetes mellitus with neurological complications ([place]) 16/05/9603  . Proteinuria 09/02/2017  . Paresthesia 09/02/2017  . IgA nephropathy 06/10/2017  . Language barrier 06/10/2017  . Gastroesophageal reflux disease 06/10/2017  . SOB (shortness of breath) 02/24/2017  . Hyperlipidemia 04/21/2016  . Chronic right-sided low back pain with right-sided sciatica 01/29/2016  . Morbid obesity ([place]) 01/29/2016  . Hypokalemia 01/29/2016  . Essential hypertension 01/29/2016   Past Medical History:  Diagnosis Date  . Diabetes mellitus without  complication (HCC)    Dr. Kelton Pillar  . Fatty liver 2019   elevated LFTs, negative for Hep B and Hep C 04/2018  . GERD (gastroesophageal reflux disease)   . Hyperlipidemia   . Hypertension   . Hypothyroidism 2019  . IgA nephropathy 2019   per biopsy, Dr. Corliss Parish  . Language barrier   . Lumbar stenosis    Dr. Louanne Skye  . Obesity   . Osteoarthritis   . Proteinuria     Family History  Problem Relation Age of Onset  . Diabetes Mother   . Hypertension Father   . Colon cancer Neg Hx   . Stomach cancer Neg Hx   . Esophageal cancer Neg Hx   . Rectal cancer Neg Hx   . Liver cancer Neg Hx     Past Surgical History:  Procedure Laterality Date  . RENAL BIOPSY  2018   IgA nephropathy, Dr. Corliss Parish   Social History   Occupational History  . Occupation: homemaker  Tobacco Use  . Smoking status: Never Smoker  . Smokeless tobacco: Never Used  Vaping Use  . Vaping Use: Never used  Substance and Sexual Activity  . Alcohol use: Never  . Drug use: No  . Sexual activity: Not on file

## 2020-03-01 NOTE — Patient Instructions (Addendum)
Plan: Knee is suffering from osteoarthritis, only real proven treatments are Weight loss,and exercise. Well padded shoes help. Ice the knee that is suffering from osteoarthritis, only real proven treatments are Well padded shoes help. Ice the knee 2-3 times a day 15-20 mins at a time.-3 times a day 15-20 mins at a time. Hot showers in the AM.  You can not take arthritis medications like NSAIDs due to renal dysfunction Injection with steroid may be of benefit. Hemp CBD capsules, amazon.com 5,000-7,000 mg per bottle, 60 capsules per bottle, take one capsule twice a day. Cane in the right hand to use with right leg weight bearing. Recommend MRI of the right knee to assess for large torn meniscus as a cause for worsening right knee arthrosis changes or inflamatory synovitis. Will have you see Linda Black for evaluation of your right knee due to accelerated arthritis, may need  Arthroscopic surgery. MRI ordered.  Follow-Up Instructions: No follow-ups on file.

## 2020-03-15 ENCOUNTER — Other Ambulatory Visit: Payer: Self-pay | Admitting: Medical

## 2020-03-22 ENCOUNTER — Ambulatory Visit: Payer: Medicaid Other | Admitting: Orthopedic Surgery

## 2020-03-25 ENCOUNTER — Ambulatory Visit
Admission: RE | Admit: 2020-03-25 | Discharge: 2020-03-25 | Disposition: A | Discharge status: Home / Self Care | Payer: Medicaid Other | Source: Ambulatory Visit | Attending: Specialist | Admitting: Specialist

## 2020-03-25 DIAGNOSIS — M222X1 Patellofemoral disorders, right knee: Secondary | ICD-10-CM

## 2020-03-25 DIAGNOSIS — M1711 Unilateral primary osteoarthritis, right knee: Secondary | ICD-10-CM

## 2020-03-25 DIAGNOSIS — M25561 Pain in right knee: Secondary | ICD-10-CM

## 2020-04-01 ENCOUNTER — Other Ambulatory Visit: Payer: Self-pay | Admitting: Specialist

## 2020-04-04 ENCOUNTER — Telehealth: Payer: Self-pay

## 2020-04-04 ENCOUNTER — Ambulatory Visit (INDEPENDENT_AMBULATORY_CARE_PROVIDER_SITE_OTHER): Payer: Medicaid Other | Admitting: Orthopedic Surgery

## 2020-04-04 DIAGNOSIS — M1711 Unilateral primary osteoarthritis, right knee: Secondary | ICD-10-CM | POA: Diagnosis not present

## 2020-04-04 MED ORDER — TRAMADOL HCL 50 MG PO TABS: 50.0000 mg | ORAL_TABLET | Freq: Every day | ORAL | 0 refills | Status: DC | PRN

## 2020-04-04 NOTE — Telephone Encounter (Signed)
Noted  

## 2020-04-04 NOTE — Telephone Encounter (Signed)
Can we please get auth for right knee gel injection?

## 2020-04-05 ENCOUNTER — Telehealth: Payer: Self-pay

## 2020-04-05 NOTE — Telephone Encounter (Signed)
Will submit for [employer] patient assistance due to patient having Medicaid for Monovisc, right knee.  Mailed [employer] patient assistance application to patient's address listed.

## 2020-04-08 ENCOUNTER — Encounter: Payer: Self-pay | Admitting: Orthopedic Surgery

## 2020-04-08 NOTE — Progress Notes (Signed)
Office Visit Note   Patient: Linda Black           Date of Birth: 01/07/1971           MRN: [id number] Visit Date: 04/04/2020 Requested by: Carlena Hurl, PA-C [address] [place],  [place] [address] PCP: Carlena Hurl, PA-C  Subjective: Chief Complaint  Patient presents with  . Right Knee - Pain    HPI: Linda Black is a 49 y.o. female who presents to the office complaining of right knee pain. Patient has had pain for 1 year. She denies any injury. She localizes the majority of her pain to the medial aspect of the right knee. She states that walking and ascending stairs makes her pain worse. She has pain that wakes her up at night and pain at rest. She denies any history of surgery in the right knee. She denies any groin pain but does note low back pain and occasional locking of the right knee. Denies any instability. She is ambulating with a cane. She has had 2 previous injections that provided about 1 month of relief. Last injection in May 2021. She had MRI scan that was ordered by Dr. Louanne Skye that revealed radial tear of the posterior horn the medial meniscus at the meniscal root with peripheral meniscal extrusion, degeneration of the anterior horn lateral meniscus, extensive full-thickness cartilage loss of the medial compartment with reactive marrow edema..                ROS: All systems reviewed are negative as they relate to the chief complaint within the history of present illness.  Patient denies fevers or chills.  Assessment & Plan: Visit Diagnoses:  1. Unilateral primary osteoarthritis, right knee     Plan: Patient is a 49 year old female presents complaining of right knee pain. She has had pain for 1 year without injury. She has had 2 previous injections that are provided about 1 month of relief. She has pain at rest and pain that wakes her up at night. MRI results are as described above in HPI. With her meniscal root tear as well as the severe medial compartment  arthritis that is already present, do not think that meniscal root repair would be a viable option for her. She will likely be in need of a knee replacement in the future due to the advanced arthritis and bone bruising that is present. Preapproved patient for right knee gel injection. Follow-up for injection once approved.  This patient is diagnosed with osteoarthritis of the knee(s).    Radiographs show evidence of joint space narrowing, osteophytes, subchondral sclerosis and/or subchondral cysts.  This patient has knee pain which interferes with functional and activities of daily living.    This patient has experienced inadequate response, adverse effects and/or intolerance with conservative treatments such as acetaminophen, NSAIDS, topical creams, physical therapy or regular exercise, knee bracing and/or weight loss.   This patient has experienced inadequate response or has a contraindication to intra articular steroid injections for at least 3 months.   This patient is not scheduled to have a total knee replacement within 6 months of starting treatment with viscosupplementation.   Follow-Up Instructions: No follow-ups on file.   Orders:  No orders of the defined types were placed in this encounter.  Meds ordered this encounter  Medications  . traMADol (ULTRAM) 50 MG tablet    Sig: Take 1 tablet (50 mg total) by mouth daily as needed.    Dispense:  20 tablet    Refill:  0      Procedures: No procedures performed   Clinical Data: No additional findings.  Objective: Vital Signs: LMP 03/23/2017   Physical Exam:  Constitutional: Patient appears well-developed HEENT:  Head: Normocephalic Eyes:EOM are normal Neck: Normal range of motion Cardiovascular: Normal rate Pulmonary/chest: Effort normal Neurologic: Patient is alert Skin: Skin is warm Psychiatric: Patient has normal mood and affect  Ortho Exam: Ortho exam demonstrates right knee with intact extensor mechanism.  Positive effusion. Tenderness over the medial and lateral joint lines but more so over the medial joint line. No calf tenderness. Negative Homans' sign. No pain with internal rotation/external rotation of the right hip.  Specialty Comments:  No specialty comments available.  Imaging: No results found.   PMFS History: Patient Active Problem List   Diagnosis Date Noted  . Encounter for health maintenance examination in adult 01/09/2020  . BMI 35.0-35.9,adult 01/09/2020  . Radicular pain of right lower extremity 04/18/2019  . Other intervertebral disc degeneration, lumbar region 04/18/2019  . Radiculopathy of lumbosacral region 03/21/2019  . Right leg pain 03/21/2019  . Chest pain 09/23/2018  . Neuropathy involving both lower extremities 09/23/2018  . Screen for colon cancer 05/17/2018  . Vaccine counseling 05/17/2018  . Fatty liver 05/17/2018  . Screening for cervical cancer 05/17/2018  . Hypothyroidism 05/17/2018  . Need for pneumococcal vaccination 05/17/2018  . Generalized abdominal tenderness 05/17/2018  . Need for influenza vaccination 04/13/2018  . Other fatigue 02/08/2018  . Edema 01/25/2018  . Renal failure (ARF), acute on chronic (HCC) 01/20/2018  . Normocytic normochromic anemia 01/20/2018  . Tremor 01/05/2018  . Vitamin D deficiency 01/05/2018  . RUQ abdominal pain 10/27/2017  . Hemorrhoids 10/27/2017  . High risk medication use 09/25/2017  . Type 2 diabetes mellitus with neurological complications ([facility]) 44/96/7591  . Proteinuria 09/02/2017  . Paresthesia 09/02/2017  . IgA nephropathy 06/10/2017  . Language barrier 06/10/2017  . Gastroesophageal reflux disease 06/10/2017  . SOB (shortness of breath) 02/24/2017  . Hyperlipidemia 04/21/2016  . Chronic right-sided low back pain with right-sided sciatica 01/29/2016  . Morbid obesity ([place]) 01/29/2016  . Hypokalemia 01/29/2016  . Essential hypertension 01/29/2016   Past Medical History:  Diagnosis Date  .  Diabetes mellitus without complication (HCC)    Dr. Kelton Pillar  . Fatty liver 2019   elevated LFTs, negative for Hep B and Hep C 04/2018  . GERD (gastroesophageal reflux disease)   . Hyperlipidemia   . Hypertension   . Hypothyroidism 2019  . IgA nephropathy 2019   per biopsy, Dr. Corliss Parish  . Language barrier   . Lumbar stenosis    Dr. Louanne Skye  . Obesity   . Osteoarthritis   . Proteinuria     Family History  Problem Relation Age of Onset  . Diabetes Mother   . Hypertension Father   . Colon cancer Neg Hx   . Stomach cancer Neg Hx   . Esophageal cancer Neg Hx   . Rectal cancer Neg Hx   . Liver cancer Neg Hx     Past Surgical History:  Procedure Laterality Date  . RENAL BIOPSY  2018   IgA nephropathy, Dr. Corliss Parish   Social History   Occupational History  . Occupation: homemaker  Tobacco Use  . Smoking status: Never Smoker  . Smokeless tobacco: Never Used  Vaping Use  . Vaping Use: Never used  Substance and Sexual Activity  . Alcohol use: Never  . Drug use:  No  . Sexual activity: Not on file

## 2020-04-09 ENCOUNTER — Encounter: Payer: Self-pay | Admitting: Orthopedic Surgery

## 2020-04-17 ENCOUNTER — Other Ambulatory Visit: Payer: Self-pay | Admitting: Medical

## 2020-04-18 NOTE — Telephone Encounter (Signed)
These have pharmacist re-route refill of her Lasix furosemide to her kidney doctor as they are managing this.  I believe she is on 40 mg twice daily

## 2020-05-03 ENCOUNTER — Telehealth: Payer: Self-pay

## 2020-05-03 NOTE — Telephone Encounter (Signed)
Faxed completed [employer] application at [phone] for Monovisc, right knee.

## 2020-05-16 ENCOUNTER — Telehealth: Payer: Self-pay

## 2020-05-16 NOTE — Telephone Encounter (Signed)
Talked with Keri at [employer] to check the status for Monovisc, right knee and was advised that application is still processing and will take 3-5 business days.

## 2020-05-19 ENCOUNTER — Other Ambulatory Visit: Payer: Self-pay | Admitting: Surgical

## 2020-05-30 ENCOUNTER — Other Ambulatory Visit: Payer: Self-pay | Admitting: Medical

## 2020-06-04 ENCOUNTER — Other Ambulatory Visit: Payer: Self-pay | Admitting: Surgical

## 2020-06-04 NOTE — Telephone Encounter (Signed)
Please advise. Thanks.  

## 2020-06-04 NOTE — Telephone Encounter (Signed)
Recheck status of gel injection?

## 2020-06-05 NOTE — Telephone Encounter (Signed)
See below from [place]. He is wanting update on gel injection status.

## 2020-06-11 ENCOUNTER — Encounter: Payer: Self-pay | Admitting: Physician Assistant

## 2020-06-11 ENCOUNTER — Ambulatory Visit (INDEPENDENT_AMBULATORY_CARE_PROVIDER_SITE_OTHER): Payer: Medicaid Other | Admitting: Physician Assistant

## 2020-06-11 DIAGNOSIS — M25561 Pain in right knee: Secondary | ICD-10-CM

## 2020-06-11 MED ORDER — TRAMADOL HCL 50 MG PO TABS: ORAL_TABLET | ORAL | 0 refills | Status: DC

## 2020-06-11 NOTE — Progress Notes (Signed)
Office Visit Note   Patient: Linda Black           Date of Birth: [birth year]           MRN: [id number] Visit Date: 06/11/2020              Requested by: Carlena Hurl, PA-C [address] [place],  [place] [address] PCP: Carlena Hurl, PA-C  Chief Complaint  Patient presents with  . Right Knee - Pain      HPI: This is a patient of Dr. Randel Pigg.  He has been following her for right knee arthritis.  She is currently in the process of getting approved for viscosupplementation.  She is asking of a refill of her Ultram.  Assessment & Plan: Visit Diagnoses: No diagnosis found.  Plan: I did provide her with only 10 tablets.  If she is having continued pain and the viscosupplementation has not been approved she is to follow-up with Dr. Marlou Sa  Follow-Up Instructions: No follow-ups on file.   Ortho Exam  Patient is alert, oriented, no adenopathy, well-dressed, normal affect, normal respiratory effort. Right knee, no cellulitis moderate soft tissue swelling pain with range of motion.  Compartments are soft and nontender.  No sign of acute infection.  Imaging: No results found. No images are attached to the encounter.  Labs: Lab Results  Component Value Date   HGBA1C 5.7 (H) 01/09/2020   HGBA1C 5.6 07/21/2019   HGBA1C 6.1 (A) 09/10/2018   LABURIC 5.3 06/20/2007   REPTSTATUS 12/17/2010 FINAL 12/16/2010   CULT  12/16/2010    Multiple bacterial morphotypes present, none predominant. Suggest appropriate recollection if clinically indicated.   LABORGA Insignificant Growth 01/29/2016     Lab Results  Component Value Date   ALBUMIN 4.4 01/09/2020   ALBUMIN 4.4 09/23/2018   ALBUMIN 4.5 04/13/2018   LABURIC 5.3 06/20/2007    Lab Results  Component Value Date   MG 2.3 01/22/2018   MG 2.1 01/20/2018   Lab Results  Component Value Date   VD25OH 28.5 (L) 05/17/2018   VD25OH 13.0 (L) 09/02/2017    No results found for: PREALBUMIN CBC EXTENDED Latest Ref Rng & Units  06/02/2018 04/13/2018 01/20/2018  WBC 4.0 - 10.5 K/uL 7.5 6.7 7.9  RBC 3.87 - 5.11 MIL/uL 4.89 4.21 3.47(L)  HGB 12.0 - 15.0 g/dL 14.1 12.9 11.1(L)  HCT 36 - 46 % 43.0 39.1 33.5(L)  PLT 150 - 400 K/uL 287 248 211  NEUTROABS 1.7 - 7.7 K/uL 4.3 3.7 7.4  LYMPHSABS 0.7 - 4.0 K/uL 2.1 2.2 0.3(L)     There is no height or weight on file to calculate BMI.  Orders:  No orders of the defined types were placed in this encounter.  Meds ordered this encounter  Medications  . traMADol (ULTRAM) 50 MG tablet    Sig: TAKE 1 TABLET(50 MG) BY MOUTH DAILY AS NEEDED    Dispense:  10 tablet    Refill:  0     Procedures: No procedures performed  Clinical Data: No additional findings.  ROS:  All other systems negative, except as noted in the HPI. Review of Systems  Objective: Vital Signs: LMP 03/23/2017   Specialty Comments:  No specialty comments available.  PMFS History: Patient Active Problem List   Diagnosis Date Noted  . Encounter for health maintenance examination in adult 01/09/2020  . BMI 35.0-35.9,adult 01/09/2020  . Radicular pain of right lower extremity 04/18/2019  . Other intervertebral disc degeneration, lumbar region  04/18/2019  . Radiculopathy of lumbosacral region 03/21/2019  . Right leg pain 03/21/2019  . Chest pain 09/23/2018  . Neuropathy involving both lower extremities 09/23/2018  . Screen for colon cancer 05/17/2018  . Vaccine counseling 05/17/2018  . Fatty liver 05/17/2018  . Screening for cervical cancer 05/17/2018  . Hypothyroidism 05/17/2018  . Need for pneumococcal vaccination 05/17/2018  . Generalized abdominal tenderness 05/17/2018  . Need for influenza vaccination 04/13/2018  . Other fatigue 02/08/2018  . Edema 01/25/2018  . Renal failure (ARF), acute on chronic (HCC) 01/20/2018  . Normocytic normochromic anemia 01/20/2018  . Tremor 01/05/2018  . Vitamin D deficiency 01/05/2018  . RUQ abdominal pain 10/27/2017  . Hemorrhoids 10/27/2017  . High  risk medication use 09/25/2017  . Type 2 diabetes mellitus with neurological complications ([facility]) 27/02/8674  . Proteinuria 09/02/2017  . Paresthesia 09/02/2017  . IgA nephropathy 06/10/2017  . Language barrier 06/10/2017  . Gastroesophageal reflux disease 06/10/2017  . SOB (shortness of breath) 02/24/2017  . Hyperlipidemia 04/21/2016  . Chronic right-sided low back pain with right-sided sciatica 01/29/2016  . Morbid obesity ([facility]) 01/29/2016  . Hypokalemia 01/29/2016  . Essential hypertension 01/29/2016   Past Medical History:  Diagnosis Date  . Diabetes mellitus without complication (HCC)    Dr. Kelton Pillar  . Fatty liver 2019   elevated LFTs, negative for Hep B and Hep C 04/2018  . GERD (gastroesophageal reflux disease)   . Hyperlipidemia   . Hypertension   . Hypothyroidism 2019  . IgA nephropathy 2019   per biopsy, Dr. Corliss Parish  . Language barrier   . Lumbar stenosis    Dr. Louanne Skye  . Obesity   . Osteoarthritis   . Proteinuria     Family History  Problem Relation Age of Onset  . Diabetes Mother   . Hypertension Father   . Colon cancer Neg Hx   . Stomach cancer Neg Hx   . Esophageal cancer Neg Hx   . Rectal cancer Neg Hx   . Liver cancer Neg Hx     Past Surgical History:  Procedure Laterality Date  . RENAL BIOPSY  2018   IgA nephropathy, Dr. Corliss Parish   Social History   Occupational History  . Occupation: homemaker  Tobacco Use  . Smoking status: Never Smoker  . Smokeless tobacco: Never Used  Vaping Use  . Vaping Use: Never used  Substance and Sexual Activity  . Alcohol use: Never  . Drug use: No  . Sexual activity: Not on file

## 2020-06-23 ENCOUNTER — Other Ambulatory Visit: Payer: Self-pay | Admitting: Medical

## 2020-06-25 ENCOUNTER — Other Ambulatory Visit: Payer: Self-pay | Admitting: Physician Assistant

## 2020-06-26 ENCOUNTER — Other Ambulatory Visit: Payer: Self-pay | Admitting: Medical

## 2020-06-27 ENCOUNTER — Other Ambulatory Visit: Payer: Self-pay

## 2020-06-27 ENCOUNTER — Ambulatory Visit (INDEPENDENT_AMBULATORY_CARE_PROVIDER_SITE_OTHER): Payer: Medicaid Other | Admitting: Orthopedic Surgery

## 2020-06-27 DIAGNOSIS — M1711 Unilateral primary osteoarthritis, right knee: Secondary | ICD-10-CM

## 2020-06-30 ENCOUNTER — Encounter: Payer: Self-pay | Admitting: Orthopedic Surgery

## 2020-06-30 NOTE — Progress Notes (Signed)
Office Visit Note   Patient: Linda Black           Date of Birth: [birth year]           MRN: [id number] Visit Date: 06/27/2020 Requested by: Carlena Hurl, PA-C [address] [place],  [place] [address] PCP: Carlena Hurl, PA-C  Subjective: Chief Complaint  Patient presents with  . Right Knee - Pain    HPI: Linda Black is a 49 y.o. female who presents to the office complaining of right knee pain.  Patient has a history of right knee pain with MRI of the right knee back in August 2021 that revealed medial meniscal root tear with meniscal extrusion as well as tricompartmental degenerative changes, most severe in the medial compartment.  She has been awaiting approval for gel injection for her right knee pain.  She notes continued right knee pain that has gone to the point that she has had to quit her job due to the knee pain.  She has had good relief from her prior injection of cortisone.  Authorization is still pending at this point.  Localizes the majority of her pain to the medial aspect of the knee and states that it hurts with every step.  It does wake her up commonly..                ROS: All systems reviewed are negative as they relate to the chief complaint within the history of present illness.  Patient denies fevers or chills.  Assessment & Plan: Visit Diagnoses:  1. Unilateral primary osteoarthritis, right knee     Plan: Patient is a 49 year old female presents complaint of right knee pain.  She has a history of right knee pain due to medial meniscal root tear as well as knee arthritis.  She has had good relief with right knee cortisone injection in the past but today she returns as she is seeking anything that will help her knee feel better.  She has a very large effusion of the right knee.  Still waiting on approval of the gel injection due to financial hardship.  She has had to quit her job due to the knee pain.  While awaiting approval for gel injection, plan to  administer right knee cortisone injection today.  Patient tolerated the procedure well.  Approximately 90 cc was aspirated from the right knee.  Plan to follow-up after approval of the gel injection.  She will likely have to consider surgical management of this problem in the form of total knee replacement within the next few years due to how much pain she is having.  Follow-Up Instructions: No follow-ups on file.   Orders:  No orders of the defined types were placed in this encounter.  No orders of the defined types were placed in this encounter.     Procedures: Large Joint Inj: R knee on 07/01/2020 4:59 PM Indications: diagnostic evaluation, joint swelling and pain Details: 18 G 1.5 in needle, superolateral approach  Arthrogram: No  Medications: 5 mL lidocaine 1 %; 40 mg methylPREDNISolone acetate 40 MG/ML; 4 mL bupivacaine 0.25 % Outcome: tolerated well, no immediate complications Procedure, treatment alternatives, risks and benefits explained, specific risks discussed. Consent was given by the patient. Immediately prior to procedure a time out was called to verify the correct patient, procedure, equipment, support staff and site/side marked as required. Patient was prepped and draped in the usual sterile fashion.       Clinical Data: No  additional findings.  Objective: Vital Signs: LMP 03/23/2017   Physical Exam:  Constitutional: Patient appears well-developed HEENT:  Head: Normocephalic Eyes:EOM are normal Neck: Normal range of motion Cardiovascular: Normal rate Pulmonary/chest: Effort normal Neurologic: Patient is alert Skin: Skin is warm Psychiatric: Patient has normal mood and affect  Ortho Exam: Ortho exam demonstrates right knee with large effusion.  Tenderness diffusely throughout the right knee, mostly over the medial joint line.  No pain with hip range of motion.  Able to perform straight leg raise with extensor mechanism intact.  No calf tenderness.  Negative  Homans' sign.  No significant ligamentous laxity to varus/valgus stress or anterior/posterior drawer.  Specialty Comments:  No specialty comments available.  Imaging: No results found.   PMFS History: Patient Active Problem List   Diagnosis Date Noted  . Encounter for health maintenance examination in adult 01/09/2020  . BMI 35.0-35.9,adult 01/09/2020  . Radicular pain of right lower extremity 04/18/2019  . Other intervertebral disc degeneration, lumbar region 04/18/2019  . Radiculopathy of lumbosacral region 03/21/2019  . Right leg pain 03/21/2019  . Chest pain 09/23/2018  . Neuropathy involving both lower extremities 09/23/2018  . Screen for colon cancer 05/17/2018  . Vaccine counseling 05/17/2018  . Fatty liver 05/17/2018  . Screening for cervical cancer 05/17/2018  . Hypothyroidism 05/17/2018  . Need for pneumococcal vaccination 05/17/2018  . Generalized abdominal tenderness 05/17/2018  . Need for influenza vaccination 04/13/2018  . Other fatigue 02/08/2018  . Edema 01/25/2018  . Renal failure (ARF), acute on chronic (HCC) 01/20/2018  . Normocytic normochromic anemia 01/20/2018  . Tremor 01/05/2018  . Vitamin D deficiency 01/05/2018  . RUQ abdominal pain 10/27/2017  . Hemorrhoids 10/27/2017  . High risk medication use 09/25/2017  . Type 2 diabetes mellitus with neurological complications ([place]) 29/92/4268  . Proteinuria 09/02/2017  . Paresthesia 09/02/2017  . IgA nephropathy 06/10/2017  . Language barrier 06/10/2017  . Gastroesophageal reflux disease 06/10/2017  . SOB (shortness of breath) 02/24/2017  . Hyperlipidemia 04/21/2016  . Chronic right-sided low back pain with right-sided sciatica 01/29/2016  . Morbid obesity ([place]) 01/29/2016  . Hypokalemia 01/29/2016  . Essential hypertension 01/29/2016   Past Medical History:  Diagnosis Date  . Diabetes mellitus without complication (HCC)    Dr. Kelton Pillar  . Fatty liver 2019   elevated LFTs, negative for Hep B  and Hep C 04/2018  . GERD (gastroesophageal reflux disease)   . Hyperlipidemia   . Hypertension   . Hypothyroidism 2019  . IgA nephropathy 2019   per biopsy, Dr. Corliss Parish  . Language barrier   . Lumbar stenosis    Dr. Louanne Skye  . Obesity   . Osteoarthritis   . Proteinuria     Family History  Problem Relation Age of Onset  . Diabetes Mother   . Hypertension Father   . Colon cancer Neg Hx   . Stomach cancer Neg Hx   . Esophageal cancer Neg Hx   . Rectal cancer Neg Hx   . Liver cancer Neg Hx     Past Surgical History:  Procedure Laterality Date  . RENAL BIOPSY  2018   IgA nephropathy, Dr. Corliss Parish   Social History   Occupational History  . Occupation: homemaker  Tobacco Use  . Smoking status: Never Smoker  . Smokeless tobacco: Never Used  Vaping Use  . Vaping Use: Never used  Substance and Sexual Activity  . Alcohol use: Never  . Drug use: No  . Sexual activity: Not  on file

## 2020-07-01 ENCOUNTER — Encounter: Payer: Self-pay | Admitting: Orthopedic Surgery

## 2020-07-01 DIAGNOSIS — M1711 Unilateral primary osteoarthritis, right knee: Secondary | ICD-10-CM

## 2020-07-01 MED ORDER — BUPIVACAINE HCL 0.25 % IJ SOLN
4.0000 mL | INTRAMUSCULAR | Status: AC | PRN
  Administered 2020-07-01: 4 mL via INTRA_ARTICULAR

## 2020-07-01 MED ORDER — METHYLPREDNISOLONE ACETATE 40 MG/ML IJ SUSP
40.0000 mg | INTRAMUSCULAR | Status: AC | PRN
  Administered 2020-07-01: 40 mg via INTRA_ARTICULAR

## 2020-07-01 MED ORDER — LIDOCAINE HCL 1 % IJ SOLN
5.0000 mL | INTRAMUSCULAR | Status: AC | PRN
  Administered 2020-07-01: 5 mL

## 2020-07-02 ENCOUNTER — Telehealth: Payer: Self-pay

## 2020-07-02 NOTE — Telephone Encounter (Signed)
Received approval letter and PRF form from [employer] for Monovisc, right knee. Faxed completed PRF form to [employer] at [id number].

## 2020-07-07 ENCOUNTER — Other Ambulatory Visit: Payer: Self-pay | Admitting: Medical

## 2020-07-11 ENCOUNTER — Telehealth: Payer: Self-pay

## 2020-07-11 NOTE — Telephone Encounter (Signed)
Received Monovisc from [employer] patient assistance. Appointment is scheduled with Dr. Marlou Sa on 07/19/2020.

## 2020-07-15 ENCOUNTER — Other Ambulatory Visit: Payer: Self-pay | Admitting: Medical

## 2020-07-15 ENCOUNTER — Other Ambulatory Visit: Payer: Self-pay | Admitting: Physician Assistant

## 2020-07-19 ENCOUNTER — Other Ambulatory Visit: Payer: Self-pay

## 2020-07-19 ENCOUNTER — Ambulatory Visit (INDEPENDENT_AMBULATORY_CARE_PROVIDER_SITE_OTHER): Payer: Medicaid Other | Admitting: Orthopedic Surgery

## 2020-07-19 DIAGNOSIS — M1711 Unilateral primary osteoarthritis, right knee: Secondary | ICD-10-CM

## 2020-07-21 ENCOUNTER — Encounter: Payer: Self-pay | Admitting: Orthopedic Surgery

## 2020-07-21 DIAGNOSIS — M1711 Unilateral primary osteoarthritis, right knee: Secondary | ICD-10-CM | POA: Diagnosis not present

## 2020-07-21 NOTE — Progress Notes (Signed)
   Procedure Note  Patient: Linda Black             Date of Birth: 14-Jan-1971           MRN: [id number]             Visit Date: 07/19/2020  Procedures: Visit Diagnoses:  1. Unilateral primary osteoarthritis, right knee     Large Joint Inj: R knee on 07/21/2020 9:58 PM Indications: diagnostic evaluation, joint swelling and pain Details: 18 G 1.5 in needle, superolateral approach  Arthrogram: No  Medications: 5 mL lidocaine 1 %; 88 mg Hyaluronan 88 MG/4ML Aspirate: 40 mL Outcome: tolerated well, no immediate complications  Patient returns for planned Monovisc injection.  She had this approved through [employer] patient assistance program so this injection would not be charged.  She has large recurrent effusion and 40 cc was aspirated from the joint prior to Monovisc injection.  She tolerated the procedure well.  Follow-up as needed. Procedure, treatment alternatives, risks and benefits explained, specific risks discussed. Consent was given by the patient. Immediately prior to procedure a time out was called to verify the correct patient, procedure, equipment, support staff and site/side marked as required. Patient was prepped and draped in the usual sterile fashion.

## 2020-07-22 MED ORDER — LIDOCAINE HCL 1 % IJ SOLN
5.0000 mL | INTRAMUSCULAR | Status: AC | PRN
  Administered 2020-07-21: 22:00:00 5 mL

## 2020-07-22 MED ORDER — HYALURONAN 88 MG/4ML IX SOSY
88.0000 mg | PREFILLED_SYRINGE | INTRA_ARTICULAR | Status: AC | PRN
  Administered 2020-07-21: 22:00:00 88 mg via INTRA_ARTICULAR

## 2020-08-06 ENCOUNTER — Other Ambulatory Visit: Payer: Self-pay | Admitting: Medical

## 2020-08-06 ENCOUNTER — Other Ambulatory Visit: Payer: Self-pay | Admitting: Specialist

## 2020-08-14 ENCOUNTER — Other Ambulatory Visit: Payer: Self-pay | Admitting: Medical

## 2020-08-17 ENCOUNTER — Ambulatory Visit (INDEPENDENT_AMBULATORY_CARE_PROVIDER_SITE_OTHER): Payer: Medicaid Other | Admitting: Orthopedic Surgery

## 2020-08-17 ENCOUNTER — Other Ambulatory Visit: Payer: Self-pay

## 2020-08-17 DIAGNOSIS — M1711 Unilateral primary osteoarthritis, right knee: Secondary | ICD-10-CM

## 2020-08-18 ENCOUNTER — Encounter: Payer: Self-pay | Admitting: Orthopedic Surgery

## 2020-08-18 DIAGNOSIS — M1711 Unilateral primary osteoarthritis, right knee: Secondary | ICD-10-CM

## 2020-08-18 MED ORDER — LIDOCAINE HCL 1 % IJ SOLN
5.0000 mL | INTRAMUSCULAR | Status: AC | PRN
  Administered 2020-08-18: 5 mL

## 2020-08-18 MED ORDER — METHYLPREDNISOLONE ACETATE 40 MG/ML IJ SUSP
40.0000 mg | INTRAMUSCULAR | Status: AC | PRN
  Administered 2020-08-18: 40 mg via INTRA_ARTICULAR

## 2020-08-18 MED ORDER — BUPIVACAINE HCL 0.25 % IJ SOLN
4.0000 mL | INTRAMUSCULAR | Status: AC | PRN
Start: 2020-08-18 — End: 2020-08-18
  Administered 2020-08-18: 4 mL via INTRA_ARTICULAR

## 2020-08-18 NOTE — Progress Notes (Signed)
Office Visit Note   Patient: Linda Black           Date of Birth: 1971/05/25           MRN: [id number] Visit Date: 08/17/2020 Requested by: Carlena Hurl, PA-C [address] [place],  [place] [address] PCP: Carlena Hurl, PA-C  Subjective: Chief Complaint  Patient presents with  . Right Knee - Pain    HPI: TIWANDA THREATS is a 50 y.o. female who presents to the office complaining of right knee pain.  Patient has a history of right knee medial compartment osteoarthritis with right knee medial meniscal root tear with extrusion.  She notes continued right knee pain following last injection with gel injection on 07/21/2020.  Injection before that was cortisone injection on 06/27/2020.  She notes that she wakes with knee pain 3/7 nights out of the week.  She has about 10-minute walking endurance.  She takes Tylenol as well as tramadol for her knee pain.  She is unable to take NSAIDs due to kidney disease and hypertension.  She has blood pressure as high as 275 systolic.  She checks her blood pressure daily and yesterday's reading was 170 systolic.  She sees her primary care physician for hypertension.  She requests injection...                ROS: All systems reviewed are negative as they relate to the chief complaint within the history of present illness.  Patient denies fevers or chills.  Assessment & Plan: Visit Diagnoses:  1. Unilateral primary osteoarthritis, right knee     Plan: Patient is a 50 year old female who presents for reevaluation of right knee pain.  She requests a knee injection today.  She has had previous gel injection that provided temporary relief on 07/21/2020.  Prior cortisone injection provided relief before that on 06/27/2020.  She has history of right knee arthritis, primarily in the medial compartment.  She also has MRI scan that revealed medial meniscal root tear with extrusion of the medial meniscus.  Discussed the pathology as well as the prognosis of her  knee.  Emphasized that she will likely need knee replacement in the near future given the recurrence of her symptoms following these injections as well as the fact that she is waking with pain half of the nights out of the week and only has about 10-minute walking endurance.  Discussed what knee replacement entails.  She would like to proceed with knee injection at this time.  Okay for this today but the next knee injection cannot be for another 3 or 4 months as she has had several at the end of 2021.  She understands this.  How long this injection lasts will determine how soon she may need knee replacement.  Right knee was aspirated of about 90 cc of inflammatory joint fluid.  No purulence of the aspirate.  Right knee cortisone injection was administered following aspiration and patient tolerated the overall procedure well.  Plan to follow-up as needed.  Follow-Up Instructions: No follow-ups on file.   Orders:  No orders of the defined types were placed in this encounter.  No orders of the defined types were placed in this encounter.     Procedures: Large Joint Inj: R knee on 08/18/2020 5:41 PM Indications: diagnostic evaluation, joint swelling and pain Details: 18 G 1.5 in needle, superolateral approach  Arthrogram: No  Medications: 5 mL lidocaine 1 %; 40 mg methylPREDNISolone acetate 40 MG/ML; 4 mL  bupivacaine 0.25 % Aspirate: 90 mL Outcome: tolerated well, no immediate complications Procedure, treatment alternatives, risks and benefits explained, specific risks discussed. Consent was given by the patient. Immediately prior to procedure a time out was called to verify the correct patient, procedure, equipment, support staff and site/side marked as required. Patient was prepped and draped in the usual sterile fashion.       Clinical Data: No additional findings.  Objective: Vital Signs: LMP 03/23/2017   Physical Exam:  Constitutional: Patient appears well-developed HEENT:  Head:  Normocephalic Eyes:EOM are normal Neck: Normal range of motion Cardiovascular: Normal rate Pulmonary/chest: Effort normal Neurologic: Patient is alert Skin: Skin is warm Psychiatric: Patient has normal mood and affect  Ortho Exam: Ortho exam demonstrates right knee with large effusion.  Tenderness over the medial and lateral joint lines, primarily over the medial joint line.  Able to perform straight leg raise.  No pain with hip range of motion.  Specialty Comments:  No specialty comments available.  Imaging: No results found.   PMFS History: Patient Active Problem List   Diagnosis Date Noted  . Encounter for health maintenance examination in adult 01/09/2020  . BMI 35.0-35.9,adult 01/09/2020  . Radicular pain of right lower extremity 04/18/2019  . Other intervertebral disc degeneration, lumbar region 04/18/2019  . Radiculopathy of lumbosacral region 03/21/2019  . Right leg pain 03/21/2019  . Chest pain 09/23/2018  . Neuropathy involving both lower extremities 09/23/2018  . Screen for colon cancer 05/17/2018  . Vaccine counseling 05/17/2018  . Fatty liver 05/17/2018  . Screening for cervical cancer 05/17/2018  . Hypothyroidism 05/17/2018  . Need for pneumococcal vaccination 05/17/2018  . Generalized abdominal tenderness 05/17/2018  . Need for influenza vaccination 04/13/2018  . Other fatigue 02/08/2018  . Edema 01/25/2018  . Renal failure (ARF), acute on chronic (HCC) 01/20/2018  . Normocytic normochromic anemia 01/20/2018  . Tremor 01/05/2018  . Vitamin D deficiency 01/05/2018  . RUQ abdominal pain 10/27/2017  . Hemorrhoids 10/27/2017  . High risk medication use 09/25/2017  . Type 2 diabetes mellitus with neurological complications ([place]) 16/05/9603  . Proteinuria 09/02/2017  . Paresthesia 09/02/2017  . IgA nephropathy 06/10/2017  . Language barrier 06/10/2017  . Gastroesophageal reflux disease 06/10/2017  . SOB (shortness of breath) 02/24/2017  . Hyperlipidemia  04/21/2016  . Chronic right-sided low back pain with right-sided sciatica 01/29/2016  . Morbid obesity ([place]) 01/29/2016  . Hypokalemia 01/29/2016  . Essential hypertension 01/29/2016   Past Medical History:  Diagnosis Date  . Diabetes mellitus without complication (HCC)    Dr. Kelton Pillar  . Fatty liver 2019   elevated LFTs, negative for Hep B and Hep C 04/2018  . GERD (gastroesophageal reflux disease)   . Hyperlipidemia   . Hypertension   . Hypothyroidism 2019  . IgA nephropathy 2019   per biopsy, Dr. Corliss Parish  . Language barrier   . Lumbar stenosis    Dr. Louanne Skye  . Obesity   . Osteoarthritis   . Proteinuria     Family History  Problem Relation Age of Onset  . Diabetes Mother   . Hypertension Father   . Colon cancer Neg Hx   . Stomach cancer Neg Hx   . Esophageal cancer Neg Hx   . Rectal cancer Neg Hx   . Liver cancer Neg Hx     Past Surgical History:  Procedure Laterality Date  . RENAL BIOPSY  2018   IgA nephropathy, Dr. Corliss Parish   Social History   Occupational  History  . Occupation: homemaker  Tobacco Use  . Smoking status: Never Smoker  . Smokeless tobacco: Never Used  Vaping Use  . Vaping Use: Never used  Substance and Sexual Activity  . Alcohol use: Never  . Drug use: No  . Sexual activity: Not on file

## 2020-08-23 ENCOUNTER — Other Ambulatory Visit: Payer: Self-pay | Admitting: Specialist

## 2020-08-25 ENCOUNTER — Other Ambulatory Visit: Payer: Self-pay | Admitting: Medical

## 2020-09-06 ENCOUNTER — Other Ambulatory Visit: Payer: Self-pay | Admitting: Medical

## 2020-09-12 ENCOUNTER — Ambulatory Visit: Payer: Medicaid Other | Admitting: Surgical

## 2020-09-13 ENCOUNTER — Other Ambulatory Visit: Payer: Self-pay | Admitting: Specialist

## 2020-09-14 ENCOUNTER — Encounter: Payer: Self-pay | Admitting: Surgical

## 2020-09-14 ENCOUNTER — Other Ambulatory Visit: Payer: Self-pay

## 2020-09-14 ENCOUNTER — Ambulatory Visit (INDEPENDENT_AMBULATORY_CARE_PROVIDER_SITE_OTHER): Payer: Medicaid Other | Admitting: Surgical

## 2020-09-14 DIAGNOSIS — M1711 Unilateral primary osteoarthritis, right knee: Secondary | ICD-10-CM | POA: Diagnosis not present

## 2020-09-15 ENCOUNTER — Encounter: Payer: Self-pay | Admitting: Surgical

## 2020-09-15 DIAGNOSIS — M1711 Unilateral primary osteoarthritis, right knee: Secondary | ICD-10-CM | POA: Diagnosis not present

## 2020-09-15 MED ORDER — LIDOCAINE HCL 1 % IJ SOLN
5.0000 mL | INTRAMUSCULAR | Status: AC | PRN
  Administered 2020-09-15: 5 mL

## 2020-09-15 NOTE — Progress Notes (Signed)
Office Visit Note   Patient: Linda Black           Date of Birth: 08-Nov-1970           MRN: [id number] Visit Date: 09/14/2020 Requested by: Carlena Hurl, PA-C [address] [place],  [place] [address] PCP: Carlena Hurl, PA-C  Subjective: Chief Complaint  Patient presents with  . Right Knee - Pain    HPI: Linda Black is a 50 y.o. female who presents to the office complaining of right knee pain. She had recent injection on 08/17/2020. This injection only provided several weeks of relief and now she feels like the fluid has returned to her knee. She has pain that limits her to the point where she can only walk for 10 minutes at a time before she has to take a break. Injection prior to the last cortisone injection was a gel injection on 07/21/2020. She wakes with knee pain multiple times per week. Takes tramadol for knee pain as she cannot take NSAIDs due to history of kidney disease and hypertension..                ROS: All systems reviewed are negative as they relate to the chief complaint within the history of present illness.  Patient denies fevers or chills.  Assessment & Plan: Visit Diagnoses:  1. Unilateral primary osteoarthritis, right knee     Plan: Patient is a 50 year old female presents complaining of right knee pain. She has a history of right knee osteoarthritis that was diagnosed on MRI of the right knee from 2021. She also has a medial meniscal root tear with meniscal extrusion on the last MRI. She is having to receive frequent injections but it is too soon for another injection. Seems she is reaching the point where injections are not lasting and she may have to consider total knee arthroplasty. Discussed this procedure in depth with her including the recovery time frame, expectations, risks and benefits of the procedure. Discussed the risks including knee stiffness, knee instability, prosthetic joint infection, nerve/vessel damage. After discussion, she agrees  that she will look up more information about knee replacement and consider this. For now she would like the knee aspirated. She tolerated the aspiration well and 70 cc of synovial fluid was aspirated successfully. She will follow-up with the office as needed.  Follow-Up Instructions: No follow-ups on file.   Orders:  No orders of the defined types were placed in this encounter.  No orders of the defined types were placed in this encounter.     Procedures: Large Joint Inj: R knee on 09/15/2020 11:46 AM Indications: diagnostic evaluation, joint swelling and pain Details: 18 G 1.5 in needle, superolateral approach  Arthrogram: No  Medications: 5 mL lidocaine 1 % Aspirate: 70 mL Outcome: tolerated well, no immediate complications Procedure, treatment alternatives, risks and benefits explained, specific risks discussed. Consent was given by the patient. Immediately prior to procedure a time out was called to verify the correct patient, procedure, equipment, support staff and site/side marked as required. Patient was prepped and draped in the usual sterile fashion.       Clinical Data: No additional findings.  Objective: Vital Signs: LMP 03/23/2017   Physical Exam:  Constitutional: Patient appears well-developed HEENT:  Head: Normocephalic Eyes:EOM are normal Neck: Normal range of motion Cardiovascular: Normal rate Pulmonary/chest: Effort normal Neurologic: Patient is alert Skin: Skin is warm Psychiatric: Patient has normal mood and affect  Ortho Exam: Ortho exam demonstrates  right knee with large effusion. Tenderness over the medial joint line. Mild tenderness over the lateral joint line. No calf tenderness. Negative Homans' sign. Able to perform straight leg raise. No tenderness of the patella. No pain with hip range of motion.  Specialty Comments:  No specialty comments available.  Imaging: No results found.   PMFS History: Patient Active Problem List   Diagnosis  Date Noted  . Encounter for health maintenance examination in adult 01/09/2020  . BMI 35.0-35.9,adult 01/09/2020  . Radicular pain of right lower extremity 04/18/2019  . Other intervertebral disc degeneration, lumbar region 04/18/2019  . Radiculopathy of lumbosacral region 03/21/2019  . Right leg pain 03/21/2019  . Chest pain 09/23/2018  . Neuropathy involving both lower extremities 09/23/2018  . Screen for colon cancer 05/17/2018  . Vaccine counseling 05/17/2018  . Fatty liver 05/17/2018  . Screening for cervical cancer 05/17/2018  . Hypothyroidism 05/17/2018  . Need for pneumococcal vaccination 05/17/2018  . Generalized abdominal tenderness 05/17/2018  . Need for influenza vaccination 04/13/2018  . Other fatigue 02/08/2018  . Edema 01/25/2018  . Renal failure (ARF), acute on chronic (HCC) 01/20/2018  . Normocytic normochromic anemia 01/20/2018  . Tremor 01/05/2018  . Vitamin D deficiency 01/05/2018  . RUQ abdominal pain 10/27/2017  . Hemorrhoids 10/27/2017  . High risk medication use 09/25/2017  . Type 2 diabetes mellitus with neurological complications ([place]) [id number]  . Proteinuria 09/02/2017  . Paresthesia 09/02/2017  . IgA nephropathy 06/10/2017  . Language barrier 06/10/2017  . Gastroesophageal reflux disease 06/10/2017  . SOB (shortness of breath) 02/24/2017  . Hyperlipidemia 04/21/2016  . Chronic right-sided low back pain with right-sided sciatica 01/29/2016  . Morbid obesity ([place]) 01/29/2016  . Hypokalemia 01/29/2016  . Essential hypertension 01/29/2016   Past Medical History:  Diagnosis Date  . Diabetes mellitus without complication (HCC)    Dr. Kelton Pillar  . Fatty liver 2019   elevated LFTs, negative for Hep B and Hep C 04/2018  . GERD (gastroesophageal reflux disease)   . Hyperlipidemia   . Hypertension   . Hypothyroidism 2019  . IgA nephropathy 2019   per biopsy, Dr. Corliss Parish  . Language barrier   . Lumbar stenosis    Dr. Louanne Skye  . Obesity    . Osteoarthritis   . Proteinuria     Family History  Problem Relation Age of Onset  . Diabetes Mother   . Hypertension Father   . Colon cancer Neg Hx   . Stomach cancer Neg Hx   . Esophageal cancer Neg Hx   . Rectal cancer Neg Hx   . Liver cancer Neg Hx     Past Surgical History:  Procedure Laterality Date  . RENAL BIOPSY  2018   IgA nephropathy, Dr. Corliss Parish   Social History   Occupational History  . Occupation: homemaker  Tobacco Use  . Smoking status: Never Smoker  . Smokeless tobacco: Never Used  Vaping Use  . Vaping Use: Never used  Substance and Sexual Activity  . Alcohol use: Never  . Drug use: No  . Sexual activity: Not on file

## 2020-09-18 ENCOUNTER — Other Ambulatory Visit: Payer: Self-pay

## 2020-09-18 ENCOUNTER — Encounter: Payer: Self-pay | Admitting: Medical

## 2020-09-18 ENCOUNTER — Ambulatory Visit (INDEPENDENT_AMBULATORY_CARE_PROVIDER_SITE_OTHER): Payer: Medicaid Other | Admitting: Medical

## 2020-09-18 VITALS — BP 122/80 | HR 73 | Ht 61.0 in | Wt 178.6 lb

## 2020-09-18 DIAGNOSIS — E785 Hyperlipidemia, unspecified: Secondary | ICD-10-CM | POA: Diagnosis not present

## 2020-09-18 DIAGNOSIS — G8929 Other chronic pain: Secondary | ICD-10-CM

## 2020-09-18 DIAGNOSIS — E559 Vitamin D deficiency, unspecified: Secondary | ICD-10-CM

## 2020-09-18 DIAGNOSIS — M5417 Radiculopathy, lumbosacral region: Secondary | ICD-10-CM

## 2020-09-18 DIAGNOSIS — I10 Essential (primary) hypertension: Secondary | ICD-10-CM

## 2020-09-18 DIAGNOSIS — E039 Hypothyroidism, unspecified: Secondary | ICD-10-CM | POA: Diagnosis not present

## 2020-09-18 DIAGNOSIS — N028 Recurrent and persistent hematuria with other morphologic changes: Secondary | ICD-10-CM

## 2020-09-18 DIAGNOSIS — M5440 Lumbago with sciatica, unspecified side: Secondary | ICD-10-CM

## 2020-09-18 DIAGNOSIS — E1149 Type 2 diabetes mellitus with other diabetic neurological complication: Secondary | ICD-10-CM | POA: Diagnosis not present

## 2020-09-18 DIAGNOSIS — M79604 Pain in right leg: Secondary | ICD-10-CM

## 2020-09-18 DIAGNOSIS — E876 Hypokalemia: Secondary | ICD-10-CM

## 2020-09-18 DIAGNOSIS — Z789 Other specified health status: Secondary | ICD-10-CM

## 2020-09-18 MED ORDER — ROSUVASTATIN CALCIUM 40 MG PO TABS: ORAL_TABLET | ORAL | 3 refills | Status: DC

## 2020-09-18 MED ORDER — GABAPENTIN 300 MG PO CAPS: 300.0000 mg | ORAL_CAPSULE | Freq: Every day | ORAL | 1 refills | Status: DC

## 2020-09-18 MED ORDER — POTASSIUM CHLORIDE CRYS ER 20 MEQ PO TBCR: 20.0000 meq | EXTENDED_RELEASE_TABLET | Freq: Every day | ORAL | 3 refills | Status: DC

## 2020-09-18 NOTE — Progress Notes (Signed)
Subjective:  Linda Black is a 50 y.o. female who presents for Chief Complaint  Patient presents with  . Medication Management    Med check appointment      Here with daughter Percy Ludwick that interprets.  Ms. Carta recently lost her father, so mourning the loss.  Medical team: Dr. Basil Dess, Dr. Marcene Duos, orthopedics Dr. Collier Flowers, endocrinology Dr. Corliss Parish, nephrology Eye doctor and dentist Rheta Hemmelgarn, Camelia Eng, PA-C here for primary care  Recently been dealing with effusion or right knee, and orthopedics recenly aspirated fluid.  Still hurts and is painful.  Last visit here December 2021.  No recent visits.  She has been seeing orthopedics and nephrology fairly regularly.  She is out of some medicines including potassium and gabapentin  Her kidney doctor handles her blood pressure medicines.  She did see kidney doctor recently and had labs and things are reportedly stable  She has been out of potassium for a month  Diabetes-she checks her blood sugars occasionally but not real regularly.  No specific concerns  Hypertension-compliant with medications  Hypothyroidism-compliant with medications  Hyperlipidemia-compliant with medications without complaint  No other aggravating or relieving factors.    No other c/o.  Past Medical History:  Diagnosis Date  . Diabetes mellitus without complication (HCC)    Dr. Kelton Pillar  . Fatty liver 2019   elevated LFTs, negative for Hep B and Hep C 04/2018  . GERD (gastroesophageal reflux disease)   . Hyperlipidemia   . Hypertension   . Hypothyroidism 2019  . IgA nephropathy 2019   per biopsy, Dr. Corliss Parish  . Language barrier   . Lumbar stenosis    Dr. Louanne Skye  . Obesity   . Osteoarthritis   . Proteinuria      The following portions of the patient's history were reviewed and updated as appropriate: allergies, current medications, past family history, past medical history, past social history,  past surgical history and problem list.  ROS Otherwise as in subjective above    Objective: BP 122/80   Pulse 73   Ht '5\' 1"'$  (1.549 m)   Wt 178 lb 9.6 oz (81 kg)   LMP 03/23/2017   SpO2 98%   BMI 33.75 kg/m   General appearance: alert, no distress, well developed, well nourished Neck: supple, no lymphadenopathy, no thyromegaly, no masses Heart: RRR, normal S1, S2, no murmurs Lungs: CTA bilaterally, no wheezes, rhonchi, or rales Pulses: 2+ radial pulses, 2+ pedal pulses, normal cap refill Ext: no edema  Diabetic Foot Exam - Simple   Simple Foot Form Diabetic Foot exam was performed with the following findings: Yes 09/18/2020 12:03 PM  Visual Inspection No deformities, no ulcerations, no other skin breakdown bilaterally: Yes Sensation Testing See comments: Yes Pulse Check Posterior Tibialis and Dorsalis pulse intact bilaterally: Yes Comments Decreased monofilament sensation throughout bilateral feet although she does feel some of the sensation with the small toes bilaterally and posterior foot      Assessment: Encounter Diagnoses  Name Primary?  . Type 2 diabetes mellitus with neurological complications ([place]) Yes  . Hypothyroidism, unspecified type   . Hyperlipidemia, unspecified hyperlipidemia type   . Essential hypertension   . Vitamin D deficiency   . Hypokalemia   . IgA nephropathy   . Language barrier   . Acquired hypothyroidism   . Chronic low back pain with sciatica, sciatica laterality unspecified, unspecified back pain laterality   . Right leg pain   . Radiculopathy of lumbosacral region  Plan: We reviewed her chart record.  We will request recent labs and office notes from nephrology, [facility].  Her recent labs were reportedly stable and no med changes were made  Her blood pressure looks great today.  I expressed my sympathy for her loss  She is about out of the few medicines which I refilled today.  Her blood pressure medications are  managed by nephrology  In 2020 we had made a referral to endocrinology.  She has not been back all of last year.  We will check her thyroid and diabetes labs today  Diabetes-discussed importance of glucometer testing, routine follow-up, healthy diet, regular exercise.  Routine labs today  Hypothyroidism-labs today, continue current medication  Hyperlipidemia-labs today, continue statin  Knee pain, effusion-follow-up with orthopedics as planned  Chronic back pain, neuropathy-restart gabapentin.  Discussed risk and benefits of medicine, proper use of medication  Yeraldine was seen today for medication management.  Diagnoses and all orders for this visit:  Type 2 diabetes mellitus with neurological complications ([place]) -     Hemoglobin A1c  Hypothyroidism, unspecified type  Hyperlipidemia, unspecified hyperlipidemia type -     Lipid panel  Essential hypertension  Vitamin D deficiency -     VITAMIN D 25 Hydroxy (Vit-D Deficiency, Fractures)  Hypokalemia  IgA nephropathy  Language barrier  Acquired hypothyroidism -     TSH -     T4, free  Chronic low back pain with sciatica, sciatica laterality unspecified, unspecified back pain laterality  Right leg pain  Radiculopathy of lumbosacral region  Other orders -     rosuvastatin (CRESTOR) 40 MG tablet; TAKE 1 TABLET(40 MG) BY MOUTH DAILY -     potassium chloride SA (KLOR-CON) 20 MEQ tablet; Take 1 tablet (20 mEq total) by mouth daily. -     gabapentin (NEURONTIN) 300 MG capsule; Take 1 capsule (300 mg total) by mouth at bedtime.   Spent > 45 minutes face to face with patient in discussion of symptoms, evaluation, plan and recommendations.    Follow up: pending labs

## 2020-09-19 ENCOUNTER — Other Ambulatory Visit: Payer: Self-pay | Admitting: Medical

## 2020-09-19 LAB — T4, FREE: Free T4: 1.19 ng/dL (ref 0.82–1.77)

## 2020-09-19 LAB — LIPID PANEL
Chol/HDL Ratio: 2.7 ratio (ref 0.0–4.4)
Cholesterol, Total: 169 mg/dL (ref 100–199)
HDL: 63 mg/dL (ref >39)
LDL Chol Calc (NIH): 64 mg/dL (ref 0–99)
Triglycerides: 265 mg/dL — ABNORMAL HIGH (ref 0–149)
VLDL Cholesterol Cal: 42 mg/dL — ABNORMAL HIGH (ref 5–40)

## 2020-09-19 LAB — HEMOGLOBIN A1C
Est. average glucose Bld gHb Est-mCnc: 120 mg/dL
Hgb A1c MFr Bld: 5.8 % — ABNORMAL HIGH (ref 4.8–5.6)

## 2020-09-19 LAB — TSH: TSH: 2.95 u[IU]/mL (ref 0.450–4.500)

## 2020-09-19 LAB — VITAMIN D 25 HYDROXY (VIT D DEFICIENCY, FRACTURES): Vit D, 25-Hydroxy: 18.1 ng/mL — ABNORMAL LOW (ref 30.0–100.0)

## 2020-09-19 MED ORDER — LEVOTHYROXINE SODIUM 25 MCG PO TABS: ORAL_TABLET | ORAL | 1 refills | Status: DC

## 2020-09-19 MED ORDER — VITAMIN D 25 MCG (1000 UNIT) PO TABS: 1000.0000 [IU] | ORAL_TABLET | Freq: Every day | ORAL | 3 refills | Status: DC

## 2020-10-01 ENCOUNTER — Other Ambulatory Visit: Payer: Self-pay | Admitting: Specialist

## 2020-10-03 ENCOUNTER — Ambulatory Visit (INDEPENDENT_AMBULATORY_CARE_PROVIDER_SITE_OTHER): Payer: Medicaid Other

## 2020-10-03 ENCOUNTER — Ambulatory Visit (INDEPENDENT_AMBULATORY_CARE_PROVIDER_SITE_OTHER): Payer: Medicaid Other | Admitting: Specialist

## 2020-10-03 ENCOUNTER — Other Ambulatory Visit: Payer: Self-pay

## 2020-10-03 ENCOUNTER — Encounter: Payer: Self-pay | Admitting: Specialist

## 2020-10-03 VITALS — BP 141/92 | HR 66 | Ht 61.0 in | Wt 178.7 lb

## 2020-10-03 DIAGNOSIS — M48062 Spinal stenosis, lumbar region with neurogenic claudication: Secondary | ICD-10-CM

## 2020-10-03 DIAGNOSIS — M1711 Unilateral primary osteoarthritis, right knee: Secondary | ICD-10-CM | POA: Diagnosis not present

## 2020-10-03 DIAGNOSIS — M4316 Spondylolisthesis, lumbar region: Secondary | ICD-10-CM

## 2020-10-03 DIAGNOSIS — M2391 Unspecified internal derangement of right knee: Secondary | ICD-10-CM | POA: Diagnosis not present

## 2020-10-03 DIAGNOSIS — N183 Chronic kidney disease, stage 3 unspecified: Secondary | ICD-10-CM

## 2020-10-03 NOTE — Progress Notes (Signed)
Office Visit Note   Patient: Linda Black           Date of Birth: 20-Jan-1971           MRN: [id number] Visit Date: 10/03/2020              Requested by: Carlena Hurl, PA-C [address] [place],  [place] [address] PCP: Carlena Hurl, PA-C   Assessment & Plan: Visit Diagnoses:  1. Internal derangement of multiple sites of right knee   2. Spinal stenosis of lumbar region with neurogenic claudication   3. Unilateral primary osteoarthritis, right knee   4. Stage 3 chronic kidney disease, unspecified whether stage 3a or 3b CKD (HCC)   5. Spondylolisthesis of lumbar region     Plan: Avoid bending, stooping and avoid lifting weights greater than 10 lbs. Avoid prolong standing and walking. Avoid frequent bending and stooping  No lifting greater than 10 lbs. May use ice or moist heat for pain. Weight loss is of benefit. Handicap license is approved. Dr. Romona Curls secretary/Assistant will call to arrange for epidural steroid injection  Unable to take arthritis meds like motrin and naprosyn due to CKD Stage 3.   Follow-Up Instructions: No follow-ups on file.   Orders:  Orders Placed This Encounter  Procedures  . XR Lumbar Spine 2-3 Views   No orders of the defined types were placed in this encounter.     Procedures: No procedures performed   Clinical Data: No additional findings.   Subjective: Chief Complaint  Patient presents with  . Lower Back - Pain    50 year old female with history of lumbar neurogenic claudication and lumbago with sciatics, ESIs by Dr. Ernestina Patches seen to help the back and leg pain for 2-3 months at a time. More  Recently she had right knee aspirated of 70 cc and injection with good relief of right knee pain but evaluation by Lurena Joiner and Dr. Marlou Sa suggested primary OA as a major concern in the right knee besides a torn meniscus medially . She is doing better with the right knee injection but the lumbar pain and leg dyscomfort appears to be  worsening.    Review of Systems  Constitutional: Negative.   HENT: Negative.   Eyes: Negative.   Respiratory: Negative.   Cardiovascular: Negative.   Gastrointestinal: Negative.   Endocrine: Negative.   Genitourinary: Negative.   Musculoskeletal: Negative.   Skin: Negative.   Allergic/Immunologic: Negative.   Neurological: Negative.   Hematological: Negative.   Psychiatric/Behavioral: Negative.      Objective: Vital Signs: BP (!) 141/92 (BP Location: Left Arm, Patient Position: Sitting)   Pulse 66   Ht '5\' 1"'$  (1.549 m)   Wt 178 lb 11.2 oz (81.1 kg)   LMP 03/23/2017   BMI 33.77 kg/m   Physical Exam Musculoskeletal:     Lumbar back: Negative right straight leg raise test and negative left straight leg raise test.     Back Exam   Tenderness  The patient is experiencing tenderness in the lumbar.  Range of Motion  Extension: abnormal  Flexion: abnormal  Lateral bend right: abnormal  Lateral bend left: abnormal  Rotation right: abnormal  Rotation left: abnormal   Muscle Strength  Right Quadriceps:  5/5  Left Quadriceps:  5/5  Right Hamstrings:  5/5  Left Hamstrings:  5/5   Tests  Straight leg raise right: negative Straight leg raise left: negative  Reflexes  Patellar: 2/4 Achilles: 2/4  Specialty Comments:  No specialty comments available.  Imaging: No results found.   PMFS History: Patient Active Problem List   Diagnosis Date Noted  . Chronic low back pain with sciatica 09/18/2020  . Encounter for health maintenance examination in adult 01/09/2020  . Radicular pain of right lower extremity 04/18/2019  . Other intervertebral disc degeneration, lumbar region 04/18/2019  . Radiculopathy of lumbosacral region 03/21/2019  . Right leg pain 03/21/2019  . Chest pain 09/23/2018  . Neuropathy involving both lower extremities 09/23/2018  . Screen for colon cancer 05/17/2018  . Vaccine counseling 05/17/2018  . Fatty liver 05/17/2018  .  Screening for cervical cancer 05/17/2018  . Hypothyroidism 05/17/2018  . Need for pneumococcal vaccination 05/17/2018  . Generalized abdominal tenderness 05/17/2018  . Need for influenza vaccination 04/13/2018  . Other fatigue 02/08/2018  . Edema 01/25/2018  . Renal failure (ARF), acute on chronic (HCC) 01/20/2018  . Normocytic normochromic anemia 01/20/2018  . Tremor 01/05/2018  . Vitamin D deficiency 01/05/2018  . RUQ abdominal pain 10/27/2017  . Hemorrhoids 10/27/2017  . High risk medication use 09/25/2017  . Type 2 diabetes mellitus with neurological complications ([place]) [id number]  . Proteinuria 09/02/2017  . Paresthesia 09/02/2017  . IgA nephropathy 06/10/2017  . Language barrier 06/10/2017  . Gastroesophageal reflux disease 06/10/2017  . SOB (shortness of breath) 02/24/2017  . Hyperlipidemia 04/21/2016  . Chronic right-sided low back pain with right-sided sciatica 01/29/2016  . Morbid obesity ([place]) 01/29/2016  . Hypokalemia 01/29/2016  . Essential hypertension 01/29/2016   Past Medical History:  Diagnosis Date  . Diabetes mellitus without complication (HCC)    Dr. Kelton Pillar  . Fatty liver 2019   elevated LFTs, negative for Hep B and Hep C 04/2018  . GERD (gastroesophageal reflux disease)   . Hyperlipidemia   . Hypertension   . Hypothyroidism 2019  . IgA nephropathy 2019   per biopsy, Dr. Corliss Parish  . Language barrier   . Lumbar stenosis    Dr. Louanne Skye  . Obesity   . Osteoarthritis   . Proteinuria     Family History  Problem Relation Age of Onset  . Diabetes Mother   . Hypertension Father   . Colon cancer Neg Hx   . Stomach cancer Neg Hx   . Esophageal cancer Neg Hx   . Rectal cancer Neg Hx   . Liver cancer Neg Hx     Past Surgical History:  Procedure Laterality Date  . RENAL BIOPSY  2018   IgA nephropathy, Dr. Corliss Parish   Social History   Occupational History  . Occupation: homemaker  Tobacco Use  . Smoking status: Never Smoker   . Smokeless tobacco: Never Used  Vaping Use  . Vaping Use: Never used  Substance and Sexual Activity  . Alcohol use: Never  . Drug use: No  . Sexual activity: Not on file

## 2020-10-03 NOTE — Patient Instructions (Signed)
Plan: Avoid bending, stooping and avoid lifting weights greater than 10 lbs. Avoid prolong standing and walking. Avoid frequent bending and stooping  No lifting greater than 10 lbs. May use ice or moist heat for pain. Weight loss is of benefit. Handicap license is approved. Dr. Romona Curls secretary/Assistant will call to arrange for epidural steroid injection  Unable to take arthritis meds like motrin and naprosyn due to CKD Stage 3

## 2020-10-11 ENCOUNTER — Other Ambulatory Visit: Payer: Self-pay | Admitting: Medical

## 2020-10-16 ENCOUNTER — Other Ambulatory Visit: Payer: Self-pay | Admitting: Specialist

## 2020-10-31 ENCOUNTER — Other Ambulatory Visit: Payer: Self-pay | Admitting: Specialist

## 2020-10-31 ENCOUNTER — Encounter: Payer: Self-pay | Admitting: Specialist

## 2020-10-31 ENCOUNTER — Other Ambulatory Visit: Payer: Self-pay

## 2020-10-31 ENCOUNTER — Ambulatory Visit (INDEPENDENT_AMBULATORY_CARE_PROVIDER_SITE_OTHER): Payer: Medicaid Other | Admitting: Specialist

## 2020-10-31 VITALS — BP 156/89 | HR 74 | Ht 61.0 in | Wt 179.0 lb

## 2020-10-31 DIAGNOSIS — M25561 Pain in right knee: Secondary | ICD-10-CM

## 2020-10-31 DIAGNOSIS — M4316 Spondylolisthesis, lumbar region: Secondary | ICD-10-CM | POA: Diagnosis not present

## 2020-10-31 DIAGNOSIS — M48062 Spinal stenosis, lumbar region with neurogenic claudication: Secondary | ICD-10-CM | POA: Diagnosis not present

## 2020-10-31 DIAGNOSIS — M1711 Unilateral primary osteoarthritis, right knee: Secondary | ICD-10-CM

## 2020-10-31 DIAGNOSIS — M2391 Unspecified internal derangement of right knee: Secondary | ICD-10-CM

## 2020-10-31 MED ORDER — BUPIVACAINE HCL 0.5 % IJ SOLN
3.0000 mL | INTRAMUSCULAR | Status: AC | PRN
  Administered 2020-10-31: 3 mL via INTRA_ARTICULAR

## 2020-10-31 MED ORDER — METHYLPREDNISOLONE ACETATE 40 MG/ML IJ SUSP
40.0000 mg | INTRAMUSCULAR | Status: AC | PRN
  Administered 2020-10-31: 40 mg via INTRA_ARTICULAR

## 2020-10-31 NOTE — Patient Instructions (Signed)
  Plan: Avoid bending, stooping and avoid lifting weights greater than 10 lbs. Avoid prolong standing and walking. Avoid frequent bending and stooping  No lifting greater than 10 lbs. May use ice or moist heat for pain. Weight loss is of benefit. Handicap license is approved. Dr. Romona Curls secretary/Assistant will call to arrange for epidural steroid injection  Unable to take arthritis meds like motrin and naprosyn due to CKD Stage 3.

## 2020-10-31 NOTE — Progress Notes (Unsigned)
Patient came in today for follow up of an ESI ordered at last visit. I can not find the order or the note related so I am placing the order today and will see her in 4 weeks. She has a spondylolisthesis L4-5 and thoracolumbar scoliosis. Patient came in today for follow up of an ESI ordered at last visit. I can not find the order or the note related so I am placing the order today and will see her in 4 weeks. She has a spondylolisthesis L4-5 and thoracolumbar scoliosis.

## 2020-10-31 NOTE — Progress Notes (Signed)
Office Visit Note   Patient: Linda Black           Date of Birth: 1970-10-22           MRN: [id number] Visit Date: 10/31/2020              Requested by: Carlena Hurl, PA-C [address] [place],  [place] [address] PCP: Carlena Hurl, PA-C   Assessment & Plan: Visit Diagnoses:  1. Spinal stenosis of lumbar region with neurogenic claudication   2. Spondylolisthesis of lumbar region   3. Unilateral primary osteoarthritis, right knee   4. Internal derangement of multiple sites of right knee     Plan:  Plan: Avoid bending, stooping and avoid lifting weights greater than 10 lbs. Avoid prolong standing and walking. Avoid frequent bending and stooping  No lifting greater than 10 lbs. May use ice or moist heat for pain. Weight loss is of benefit. Handicap license is approved. Dr. Romona Curls secretary/Assistant will call to arrange for epidural steroid injection  Unable to take arthritis meds like motrin and naprosyn due to CKD Stage 3.    Follow-Up Instructions: Return in about 4 weeks (around 11/28/2020).   Orders:  No orders of the defined types were placed in this encounter.  No orders of the defined types were placed in this encounter.     Procedures: Large Joint Inj: R knee on 10/31/2020 11:14 AM Indications: pain Details: 25 G 1.5 in needle, anterolateral approach  Arthrogram: No  Medications: 40 mg methylPREDNISolone acetate 40 MG/ML; 3 mL bupivacaine 0.5 % Outcome: tolerated well, no immediate complications Procedure, treatment alternatives, risks and benefits explained, specific risks discussed. Consent was given by the patient. Immediately prior to procedure a time out was called to verify the correct patient, procedure, equipment, support staff and site/side marked as required. Patient was prepped and draped in the usual sterile fashion.       Clinical Data: No additional findings.   Subjective: Chief Complaint  Patient presents with  . Lower  Back - Follow-up  . Left Knee - Follow-up    HPI  Review of Systems   Objective: Vital Signs: BP (!) 156/89 (BP Location: Left Arm, Patient Position: Sitting)   Pulse 74   Ht '5\' 1"'$  (1.549 m)   Wt 179 lb (81.2 kg)   LMP 03/23/2017   BMI 33.82 kg/m   Physical Exam  Ortho Exam  Specialty Comments:  No specialty comments available.  Imaging: No results found.   PMFS History: Patient Active Problem List   Diagnosis Date Noted  . Chronic low back pain with sciatica 09/18/2020  . Encounter for health maintenance examination in adult 01/09/2020  . Radicular pain of right lower extremity 04/18/2019  . Other intervertebral disc degeneration, lumbar region 04/18/2019  . Radiculopathy of lumbosacral region 03/21/2019  . Right leg pain 03/21/2019  . Chest pain 09/23/2018  . Neuropathy involving both lower extremities 09/23/2018  . Screen for colon cancer 05/17/2018  . Vaccine counseling 05/17/2018  . Fatty liver 05/17/2018  . Screening for cervical cancer 05/17/2018  . Hypothyroidism 05/17/2018  . Need for pneumococcal vaccination 05/17/2018  . Generalized abdominal tenderness 05/17/2018  . Need for influenza vaccination 04/13/2018  . Other fatigue 02/08/2018  . Edema 01/25/2018  . Renal failure (ARF), acute on chronic (HCC) 01/20/2018  . Normocytic normochromic anemia 01/20/2018  . Tremor 01/05/2018  . Vitamin D deficiency 01/05/2018  . RUQ abdominal pain 10/27/2017  . Hemorrhoids 10/27/2017  . High  risk medication use 09/25/2017  . Type 2 diabetes mellitus with neurological complications ([place]) [id number]  . Proteinuria 09/02/2017  . Paresthesia 09/02/2017  . IgA nephropathy 06/10/2017  . Language barrier 06/10/2017  . Gastroesophageal reflux disease 06/10/2017  . SOB (shortness of breath) 02/24/2017  . Hyperlipidemia 04/21/2016  . Chronic right-sided low back pain with right-sided sciatica 01/29/2016  . Morbid obesity ([facility]) 01/29/2016  . Hypokalemia 01/29/2016   . Essential hypertension 01/29/2016   Past Medical History:  Diagnosis Date  . Diabetes mellitus without complication (HCC)    Dr. Kelton Pillar  . Fatty liver 2019   elevated LFTs, negative for Hep B and Hep C 04/2018  . GERD (gastroesophageal reflux disease)   . Hyperlipidemia   . Hypertension   . Hypothyroidism 2019  . IgA nephropathy 2019   per biopsy, Dr. Corliss Parish  . Language barrier   . Lumbar stenosis    Dr. Louanne Skye  . Obesity   . Osteoarthritis   . Proteinuria     Family History  Problem Relation Age of Onset  . Diabetes Mother   . Hypertension Father   . Colon cancer Neg Hx   . Stomach cancer Neg Hx   . Esophageal cancer Neg Hx   . Rectal cancer Neg Hx   . Liver cancer Neg Hx     Past Surgical History:  Procedure Laterality Date  . RENAL BIOPSY  2018   IgA nephropathy, Dr. Corliss Parish   Social History   Occupational History  . Occupation: homemaker  Tobacco Use  . Smoking status: Never Smoker  . Smokeless tobacco: Never Used  Vaping Use  . Vaping Use: Never used  Substance and Sexual Activity  . Alcohol use: Never  . Drug use: No  . Sexual activity: Not on file

## 2020-11-05 ENCOUNTER — Other Ambulatory Visit: Payer: Self-pay | Admitting: Specialist

## 2020-11-15 ENCOUNTER — Other Ambulatory Visit: Payer: Self-pay | Admitting: Medical

## 2020-11-19 NOTE — Telephone Encounter (Signed)
Is this okay to refill? 

## 2020-11-22 ENCOUNTER — Other Ambulatory Visit: Payer: Self-pay | Admitting: Specialist

## 2020-11-29 ENCOUNTER — Ambulatory Visit (INDEPENDENT_AMBULATORY_CARE_PROVIDER_SITE_OTHER): Payer: Medicaid Other | Admitting: Specialist

## 2020-11-29 ENCOUNTER — Ambulatory Visit: Payer: Self-pay

## 2020-11-29 ENCOUNTER — Other Ambulatory Visit: Payer: Self-pay

## 2020-11-29 ENCOUNTER — Encounter: Payer: Self-pay | Admitting: Specialist

## 2020-11-29 VITALS — BP 141/90 | HR 69 | Ht 61.0 in | Wt 179.0 lb

## 2020-11-29 DIAGNOSIS — M25461 Effusion, right knee: Secondary | ICD-10-CM | POA: Diagnosis not present

## 2020-11-29 DIAGNOSIS — M4807 Spinal stenosis, lumbosacral region: Secondary | ICD-10-CM

## 2020-11-29 DIAGNOSIS — M4722 Other spondylosis with radiculopathy, cervical region: Secondary | ICD-10-CM

## 2020-11-29 DIAGNOSIS — M4316 Spondylolisthesis, lumbar region: Secondary | ICD-10-CM | POA: Diagnosis not present

## 2020-11-29 DIAGNOSIS — M542 Cervicalgia: Secondary | ICD-10-CM

## 2020-11-29 DIAGNOSIS — M1711 Unilateral primary osteoarthritis, right knee: Secondary | ICD-10-CM | POA: Diagnosis not present

## 2020-11-29 MED ORDER — BUPIVACAINE HCL 0.5 % IJ SOLN
3.0000 mL | INTRAMUSCULAR | Status: AC | PRN
  Administered 2020-11-29: 3 mL via INTRA_ARTICULAR

## 2020-11-29 MED ORDER — METHYLPREDNISOLONE ACETATE 40 MG/ML IJ SUSP
40.0000 mg | INTRAMUSCULAR | Status: AC | PRN
  Administered 2020-11-29: 40 mg via INTRA_ARTICULAR

## 2020-11-29 MED ORDER — LIDOCAINE HCL 1 % IJ SOLN
3.0000 mL | INTRAMUSCULAR | Status: AC | PRN
  Administered 2020-11-29: 3 mL

## 2020-11-29 NOTE — Progress Notes (Signed)
Office Visit Note   Patient: Linda Black           Date of Birth: 07-Oct-1970           MRN: [id number] Visit Date: 11/29/2020              Requested by: Carlena Hurl, PA-C [address] [place],  [place] [address] PCP: Carlena Hurl, PA-C   Assessment & Plan: Visit Diagnoses:  1. Neck pain   2. Spinal stenosis of lumbosacral region   3. Other spondylosis with radiculopathy, cervical region   4. Spondylolisthesis, lumbar region   5. Unilateral primary osteoarthritis, right knee   6. Effusion, right knee     Plan: The main ways of treat osteoarthritis, that are found to be success. Weight loss helps to decrease pain. Exercise is important to maintaining cartilage and thickness and strengthening. NSAIDs like motrin, tylenol, alleve are meds decreasing the inflamation. Ice is okay  In afternoon and evening and hot shower in the am Please call a month ahead of follow up appt so we can order new Synvisc One material. Avoid bending, stooping and avoid lifting weights greater than 10 lbs. Avoid prolong standing and walking. Avoid frequent bending and stooping  No lifting greater than 10 lbs. May use ice or moist heat for pain. Weight loss is of benefit. Handicap license is approved. Lumbar myelogram and post myelogram CT scan ordered. Cervical myelogram and post myelogram CT scan ordered.  Avoid overhead lifting and overhead use of the arms. Do not lift greater than 5 lbs. Adjust head rest in vehicle to prevent hyperextension if rear ended. Take extra precautions to avoid falling, including use of a cane if you feel weak.   Follow-Up Instructions: No follow-ups on file.   Orders:  Orders Placed This Encounter  Procedures  . Large Joint Inj: R knee  . XR Cervical Spine 2 or 3 views   No orders of the defined types were placed in this encounter.     Procedures: Large Joint Inj: R knee on 11/29/2020 11:42 AM Indications: pain Details: 18 G 1.5 in needle,  superolateral approach  Arthrogram: No  Medications: 40 mg methylPREDNISolone acetate 40 MG/ML; 3 mL bupivacaine 0.5 %; 3 mL lidocaine 1 % Aspirate: 100 mL clear and yellow Outcome: tolerated well, no immediate complications  Bandaid applied Procedure, treatment alternatives, risks and benefits explained, specific risks discussed. Consent was given by the patient. Immediately prior to procedure a time out was called to verify the correct patient, procedure, equipment, support staff and site/side marked as required. Patient was prepped and draped in the usual sterile fashion.       Clinical Data: No additional findings.   Subjective: Chief Complaint  Patient presents with  . Lower Back - Pain  . Right Knee - Pain    HPI  Review of Systems   Objective: Vital Signs: BP (!) 141/90   Pulse 69   Ht '5\' 1"'$  (1.549 m)   Wt 179 lb (81.2 kg)   LMP 03/23/2017   BMI 33.82 kg/m   Physical Exam  Ortho Exam  Specialty Comments:  No specialty comments available.  Imaging: No results found.   PMFS History: Patient Active Problem List   Diagnosis Date Noted  . Chronic low back pain with sciatica 09/18/2020  . Encounter for health maintenance examination in adult 01/09/2020  . Radicular pain of right lower extremity 04/18/2019  . Other intervertebral disc degeneration, lumbar region 04/18/2019  .  Radiculopathy of lumbosacral region 03/21/2019  . Right leg pain 03/21/2019  . Chest pain 09/23/2018  . Neuropathy involving both lower extremities 09/23/2018  . Screen for colon cancer 05/17/2018  . Vaccine counseling 05/17/2018  . Fatty liver 05/17/2018  . Screening for cervical cancer 05/17/2018  . Hypothyroidism 05/17/2018  . Need for pneumococcal vaccination 05/17/2018  . Generalized abdominal tenderness 05/17/2018  . Need for influenza vaccination 04/13/2018  . Other fatigue 02/08/2018  . Edema 01/25/2018  . Renal failure (ARF), acute on chronic (HCC) 01/20/2018  .  Normocytic normochromic anemia 01/20/2018  . Tremor 01/05/2018  . Vitamin D deficiency 01/05/2018  . RUQ abdominal pain 10/27/2017  . Hemorrhoids 10/27/2017  . High risk medication use 09/25/2017  . Type 2 diabetes mellitus with neurological complications ([place]) [id number]  . Proteinuria 09/02/2017  . Paresthesia 09/02/2017  . IgA nephropathy 06/10/2017  . Language barrier 06/10/2017  . Gastroesophageal reflux disease 06/10/2017  . SOB (shortness of breath) 02/24/2017  . Hyperlipidemia 04/21/2016  . Chronic right-sided low back pain with right-sided sciatica 01/29/2016  . Morbid obesity ([facility]) 01/29/2016  . Hypokalemia 01/29/2016  . Essential hypertension 01/29/2016   Past Medical History:  Diagnosis Date  . Diabetes mellitus without complication (HCC)    Dr. Kelton Pillar  . Fatty liver 2019   elevated LFTs, negative for Hep B and Hep C 04/2018  . GERD (gastroesophageal reflux disease)   . Hyperlipidemia   . Hypertension   . Hypothyroidism 2019  . IgA nephropathy 2019   per biopsy, Dr. Corliss Parish  . Language barrier   . Lumbar stenosis    Dr. Louanne Skye  . Obesity   . Osteoarthritis   . Proteinuria     Family History  Problem Relation Age of Onset  . Diabetes Mother   . Hypertension Father   . Colon cancer Neg Hx   . Stomach cancer Neg Hx   . Esophageal cancer Neg Hx   . Rectal cancer Neg Hx   . Liver cancer Neg Hx     Past Surgical History:  Procedure Laterality Date  . RENAL BIOPSY  2018   IgA nephropathy, Dr. Corliss Parish   Social History   Occupational History  . Occupation: homemaker  Tobacco Use  . Smoking status: Never Smoker  . Smokeless tobacco: Never Used  Vaping Use  . Vaping Use: Never used  Substance and Sexual Activity  . Alcohol use: Never  . Drug use: No  . Sexual activity: Not on file

## 2020-11-29 NOTE — Patient Instructions (Signed)
Plan: The main ways of treat osteoarthritis, that are found to be success. Weight loss helps to decrease pain. Exercise is important to maintaining cartilage and thickness and strengthening. NSAIDs like motrin, tylenol, alleve are meds decreasing the inflamation. Ice is okay  In afternoon and evening and hot shower in the am Please call a month ahead of follow up appt so we can order new Synvisc One material. Avoid bending, stooping and avoid lifting weights greater than 10 lbs. Avoid prolong standing and walking. Avoid frequent bending and stooping  No lifting greater than 10 lbs. May use ice or moist heat for pain. Weight loss is of benefit. Handicap license is approved. Lumbar myelogram and post myelogram CT scan ordered. Cervical myelogram and post myelogram CT scan ordered.  Avoid overhead lifting and overhead use of the arms. Do not lift greater than 5 lbs. Adjust head rest in vehicle to prevent hyperextension if rear ended. Take extra precautions to avoid falling, including use of a cane if you feel weak.

## 2020-11-30 ENCOUNTER — Telehealth: Payer: Self-pay

## 2020-11-30 NOTE — Telephone Encounter (Signed)
Phone call to patient to verify medication list and allergies for myelogram procedure. Interpreter services were used for this phone call. I spoke to the patients son, pts primary caregiver, with the help of the interpreter. Pt/cg instructed to hold Tramadol for 48hrs prior to myelogram appointment time and 24 hours after appointment. Pt/cg also instructed to have a driver the day of the procedure, the procedure would take around 2 hours, and discharge instructions discussed. Pt/cg verbalized understanding.

## 2020-12-04 ENCOUNTER — Ambulatory Visit: Payer: Medicaid Other | Admitting: Orthopaedic Surgery

## 2020-12-10 ENCOUNTER — Ambulatory Visit
Admission: RE | Admit: 2020-12-10 | Discharge: 2020-12-10 | Disposition: A | Discharge status: Home / Self Care | Payer: Medicaid Other | Source: Ambulatory Visit | Attending: Specialist | Admitting: Specialist

## 2020-12-10 ENCOUNTER — Other Ambulatory Visit: Payer: Self-pay

## 2020-12-10 VITALS — BP 174/92 | HR 57

## 2020-12-10 DIAGNOSIS — M5410 Radiculopathy, site unspecified: Secondary | ICD-10-CM

## 2020-12-10 DIAGNOSIS — G8929 Other chronic pain: Secondary | ICD-10-CM

## 2020-12-10 DIAGNOSIS — M5136 Other intervertebral disc degeneration, lumbar region: Secondary | ICD-10-CM

## 2020-12-10 DIAGNOSIS — M5417 Radiculopathy, lumbosacral region: Secondary | ICD-10-CM

## 2020-12-10 DIAGNOSIS — M4807 Spinal stenosis, lumbosacral region: Secondary | ICD-10-CM

## 2020-12-10 DIAGNOSIS — M4722 Other spondylosis with radiculopathy, cervical region: Secondary | ICD-10-CM

## 2020-12-10 DIAGNOSIS — M4316 Spondylolisthesis, lumbar region: Secondary | ICD-10-CM

## 2020-12-10 MED ORDER — DIAZEPAM 5 MG PO TABS
10.0000 mg | ORAL_TABLET | Freq: Once | ORAL | Status: AC
Start: 2020-12-10 — End: 2020-12-10
  Administered 2020-12-10: 10 mg via ORAL

## 2020-12-10 MED ORDER — IOPAMIDOL (ISOVUE-M 300) INJECTION 61%
10.0000 mL | Freq: Once | INTRAMUSCULAR | Status: AC | PRN
  Administered 2020-12-10: 10 mL via INTRATHECAL

## 2020-12-10 NOTE — Discharge Instructions (Signed)
Myelogram Discharge Instructions  1. Go home and rest quietly as needed. You may resume normal activities; however, do not exert yourself strongly or do any heavy lifting today and tomorrow.   2. DO NOT drive today.    3. You may resume your normal diet and medications unless otherwise indicated. Drink lots of extra fluids today and tomorrow.   4. The incidence of headache, nausea, or vomiting is about 5% (one in 20 patients).  If you develop a headache, lie flat for 24 hours and drink plenty of fluids until the headache goes away.  Caffeinated beverages may be helpful. If when you get up you still have a headache when standing, go back to bed and force fluids for another 24 hours.   5. If you develop severe nausea and vomiting or a headache that does not go away with the flat bedrest after 48 hours, please call [phone].   6. Call your physician for a follow-up appointment.  The results of your myelogram will be sent directly to your physician by the following day.  7. If you have any questions or if complications develop after you arrive home, please call [phone].  Discharge instructions have been explained to the patient.  The patient, or the person responsible for the patient, fully understands these instructions.   Thank you for visiting our office today.   YOU MAY RESUME YOUR TRAMADOL TOMORROW 12/11/20 AT 10:30 AM

## 2020-12-11 ENCOUNTER — Encounter: Payer: Self-pay | Admitting: Orthopaedic Surgery

## 2020-12-11 ENCOUNTER — Ambulatory Visit (INDEPENDENT_AMBULATORY_CARE_PROVIDER_SITE_OTHER): Payer: Medicaid Other

## 2020-12-11 ENCOUNTER — Ambulatory Visit (INDEPENDENT_AMBULATORY_CARE_PROVIDER_SITE_OTHER): Payer: Medicaid Other | Admitting: Orthopaedic Surgery

## 2020-12-11 VITALS — Ht 61.0 in | Wt 178.0 lb

## 2020-12-11 DIAGNOSIS — M1711 Unilateral primary osteoarthritis, right knee: Secondary | ICD-10-CM | POA: Diagnosis not present

## 2020-12-11 NOTE — Progress Notes (Signed)
Office Visit Note   Patient: Linda Black           Date of Birth: 06/19/1971           MRN: [id number] Visit Date: 12/11/2020              Requested by: Carlena Hurl, PA-C [address] [place],  [place] [address] PCP: Carlena Hurl, PA-C   Assessment & Plan: Visit Diagnoses:  1. Primary osteoarthritis of right knee     Plan: Patient has end-stage right knee DJD.  Conservative treatments have not given her the relief that she initially received.  She recently had 100 cc of fluid drawn off her knee by Dr.Nick.  At this point she continues to have pain and suffering with daily activities and based on her options and our discussion she has elected to move forward with a right total knee replacement in the near future.  Risk benefits rehab recovery including but not limited to infection, incomplete relief of pain, stiffness, DVT reviewed today.  Questions encouraged and answered.  Today's encounter was performed through an interpreter.  Follow-Up Instructions: Return for Postop for TKA.   Orders:  Orders Placed This Encounter  Procedures  . XR KNEE 3 VIEW RIGHT   No orders of the defined types were placed in this encounter.     Procedures: No procedures performed   Clinical Data: No additional findings.   Subjective: Chief Complaint  Patient presents with  . Right Knee - Pain    Patient is a 50 year old female who is brought in by her granddaughter for chronic right knee pain for last 3 years with progressive worsening.  We have seen her extensively for this problem and has tried treating the pain with nonsurgical options including injections and medications and lifestyle changes.  Unfortunately conservative treatments have lost their effectiveness.   Review of Systems  Constitutional: Negative.   HENT: Negative.   Eyes: Negative.   Respiratory: Negative.   Cardiovascular: Negative.   Endocrine: Negative.   Musculoskeletal: Negative.   Neurological:  Negative.   Hematological: Negative.   Psychiatric/Behavioral: Negative.   All other systems reviewed and are negative.    Objective: Vital Signs: Ht '5\' 1"'$  (1.549 m)   Wt 178 lb (80.7 kg)   LMP 03/23/2017   BMI 33.63 kg/m   Physical Exam Vitals and nursing note reviewed.  Constitutional:      Appearance: She is well-developed.  Pulmonary:     Effort: Pulmonary effort is normal.  Skin:    General: Skin is warm.     Capillary Refill: Capillary refill takes less than 2 seconds.  Neurological:     Mental Status: She is alert and oriented to person, place, and time.  Psychiatric:        Behavior: Behavior normal.        Thought Content: Thought content normal.        Judgment: Judgment normal.     Ortho Exam Right knee shows pain and crepitus with range of motion which is slightly limited.  Because her cruciates are stable.  Moderate joint effusion. Specialty Comments:  No specialty comments available.  Imaging: XR KNEE 3 VIEW RIGHT  Result Date: 12/11/2020 Degenerative changes of the right knee consistent with prior MRI findings.    PMFS History: Patient Active Problem List   Diagnosis Date Noted  . Chronic low back pain with sciatica 09/18/2020  . Encounter for health maintenance examination in adult 01/09/2020  .  Radicular pain of right lower extremity 04/18/2019  . Other intervertebral disc degeneration, lumbar region 04/18/2019  . Radiculopathy of lumbosacral region 03/21/2019  . Right leg pain 03/21/2019  . Chest pain 09/23/2018  . Neuropathy involving both lower extremities 09/23/2018  . Screen for colon cancer 05/17/2018  . Vaccine counseling 05/17/2018  . Fatty liver 05/17/2018  . Screening for cervical cancer 05/17/2018  . Hypothyroidism 05/17/2018  . Need for pneumococcal vaccination 05/17/2018  . Generalized abdominal tenderness 05/17/2018  . Need for influenza vaccination 04/13/2018  . Other fatigue 02/08/2018  . Edema 01/25/2018  . Renal  failure (ARF), acute on chronic (HCC) 01/20/2018  . Normocytic normochromic anemia 01/20/2018  . Tremor 01/05/2018  . Vitamin D deficiency 01/05/2018  . RUQ abdominal pain 10/27/2017  . Hemorrhoids 10/27/2017  . High risk medication use 09/25/2017  . Type 2 diabetes mellitus with neurological complications ([place]) [id number]  . Proteinuria 09/02/2017  . Paresthesia 09/02/2017  . IgA nephropathy 06/10/2017  . Language barrier 06/10/2017  . Gastroesophageal reflux disease 06/10/2017  . SOB (shortness of breath) 02/24/2017  . Hyperlipidemia 04/21/2016  . Chronic right-sided low back pain with right-sided sciatica 01/29/2016  . Morbid obesity ([place]) 01/29/2016  . Hypokalemia 01/29/2016  . Essential hypertension 01/29/2016   Past Medical History:  Diagnosis Date  . Diabetes mellitus without complication (HCC)    Dr. Kelton Pillar  . Fatty liver 2019   elevated LFTs, negative for Hep B and Hep C 04/2018  . GERD (gastroesophageal reflux disease)   . Hyperlipidemia   . Hypertension   . Hypothyroidism 2019  . IgA nephropathy 2019   per biopsy, Dr. Corliss Parish  . Language barrier   . Lumbar stenosis    Dr. Louanne Skye  . Obesity   . Osteoarthritis   . Proteinuria     Family History  Problem Relation Age of Onset  . Diabetes Mother   . Hypertension Father   . Colon cancer Neg Hx   . Stomach cancer Neg Hx   . Esophageal cancer Neg Hx   . Rectal cancer Neg Hx   . Liver cancer Neg Hx     Past Surgical History:  Procedure Laterality Date  . RENAL BIOPSY  2018   IgA nephropathy, Dr. Corliss Parish   Social History   Occupational History  . Occupation: homemaker  Tobacco Use  . Smoking status: Never Smoker  . Smokeless tobacco: Never Used  Vaping Use  . Vaping Use: Never used  Substance and Sexual Activity  . Alcohol use: Never  . Drug use: No  . Sexual activity: Not on file

## 2020-12-17 ENCOUNTER — Ambulatory Visit: Payer: Medicaid Other | Admitting: Medical

## 2020-12-17 ENCOUNTER — Encounter: Payer: Self-pay | Admitting: Medical

## 2020-12-17 VITALS — BP 140/82 | HR 69 | Ht 61.0 in | Wt 175.8 lb

## 2020-12-17 DIAGNOSIS — D649 Anemia, unspecified: Secondary | ICD-10-CM

## 2020-12-17 DIAGNOSIS — Z1211 Encounter for screening for malignant neoplasm of colon: Secondary | ICD-10-CM | POA: Diagnosis not present

## 2020-12-17 DIAGNOSIS — Z789 Other specified health status: Secondary | ICD-10-CM

## 2020-12-17 DIAGNOSIS — N02B9 Other recurrent and persistent immunoglobulin A nephropathy: Secondary | ICD-10-CM

## 2020-12-17 DIAGNOSIS — E559 Vitamin D deficiency, unspecified: Secondary | ICD-10-CM | POA: Diagnosis not present

## 2020-12-17 DIAGNOSIS — N028 Recurrent and persistent hematuria with other morphologic changes: Secondary | ICD-10-CM

## 2020-12-17 DIAGNOSIS — Z758 Other problems related to medical facilities and other health care: Secondary | ICD-10-CM

## 2020-12-17 DIAGNOSIS — E039 Hypothyroidism, unspecified: Secondary | ICD-10-CM

## 2020-12-17 DIAGNOSIS — I10 Essential (primary) hypertension: Secondary | ICD-10-CM

## 2020-12-17 DIAGNOSIS — E1149 Type 2 diabetes mellitus with other diabetic neurological complication: Secondary | ICD-10-CM

## 2020-12-17 DIAGNOSIS — E785 Hyperlipidemia, unspecified: Secondary | ICD-10-CM

## 2020-12-17 MED ORDER — LOSARTAN POTASSIUM 100 MG PO TABS: 100.0000 mg | ORAL_TABLET | Freq: Every day | ORAL | 0 refills | Status: DC

## 2020-12-17 MED ORDER — GABAPENTIN 300 MG PO CAPS: ORAL_CAPSULE | ORAL | 1 refills | Status: DC

## 2020-12-17 MED ORDER — FUROSEMIDE 40 MG PO TABS: 40.0000 mg | ORAL_TABLET | Freq: Two times a day (BID) | ORAL | 0 refills | Status: DC

## 2020-12-17 MED ORDER — AMLODIPINE BESYLATE 10 MG PO TABS: 10.0000 mg | ORAL_TABLET | Freq: Every day | ORAL | 3 refills | Status: DC

## 2020-12-17 MED ORDER — METOPROLOL SUCCINATE ER 25 MG PO TB24: ORAL_TABLET | ORAL | 3 refills | Status: DC

## 2020-12-17 NOTE — Patient Instructions (Addendum)
  High blood pressure  Continue Cozaar/losartan 100 mg daily  Continue amlodipine 10 mg daily.  You noted that you are out of this  Continue Lasix 40 mg twice daily which is for fluid and blood pressure (continue your potassium chloride 20 mEQ's daily along with the Lasix)  You should also be taking Toprol-XL/metoprolol 25 mg daily  wright your blood pressure and pulse readings down in a notebook and take them with you to your upcoming kidney doctor appointment soon.  The goal is less than 130/80 for blood pressure  Avoid added salt  Diabetes  It sounds like your recent blood sugar numbers look good.  Continue to monitor your blood sugars with goal to be 80-130 fasting in the morning  Vitamin D deficiency  Continue vitamin D supplement 1000 units daily  High cholesterol  Continue rosuvastatin Crestor 40 mg daily   Hypothyroidism  Continue levothyroxine Synthroid 25 mcg daily first thing in the morning fasting about 45 minutes before breakfast   Kidney disease  Continue Kerendia daily and follow-up with your kidney doctor soon   Screen for colon cancer  Contact your insurance company to see if Cologuard laparoscopy is covered at this stage.  The new guidelines recommend screening at 69 years and older.  If they cover this let me know and we can refer you    It appears he may be due for tetanus vaccine and updated pneumonia vaccine  Please call your insurance to see if they cover these

## 2020-12-17 NOTE — Progress Notes (Signed)
Subjective: Chief Complaint  Patient presents with  . Follow-up    Medical team: Linda Black, orthopedics Linda Black, endocrinology Linda Black, nephrology Linda Black, Linda Eng, PA-C here for primary care   She is here today with interpreter from language resources [facility].  Normally her daughter comes with her but she is out in the car today  Here for regular med check.  She has been having some muscle aches in her right lateral leg lower and has chronic knee pain.  She is supposed to have right knee surgery in July.  Been seeing orthopedics  Diabetes- she notes a careful diet, blood sugars have been running 80-90 fasting, no specific recent concerns.  She knows she is still due to see eye doctor soon  Hypertension-Home readings have been high.  She does check BP at home, - 160/90 is typical.  Based on several questions today it is not clear if she is compliant with all of her medications or not.  It sounds like she ran out of amlodipine.  And I am not so sure she has actually been taking Toprol.  What complicates things is that she sees [facility] which is not in our EMR so I do not know if she has updated refills or not on blood pressure medications from them  Vitamin D deficiency-she is compliant with vitamin D supplement  We discussed cancer screening and she has never had a colon cancer screening  Past Medical History:  Diagnosis Date  . Diabetes mellitus without complication (HCC)    Linda Black  . Fatty liver 2019   elevated LFTs, negative for Hep B and Hep C 04/2018  . GERD (gastroesophageal reflux disease)   . Hyperlipidemia   . Hypertension   . Hypothyroidism 2019  . IgA nephropathy 2019   per biopsy, Linda Black  . Language barrier   . Lumbar stenosis    Linda Black  . Obesity   . Osteoarthritis   . Proteinuria    Current Outpatient Medications on File Prior to Visit  Medication Sig Dispense Refill  . ACCU-CHEK  AVIVA PLUS test strip TEST TWICE A DAY 100 strip 3  . cholecalciferol (VITAMIN D3) 25 MCG (1000 UNIT) tablet Take 1 tablet (1,000 Units total) by mouth daily. 90 tablet 3  . Insulin Pen Needle 32G X 4 MM MISC 1 each by Does not apply route at bedtime. 100 each 11  . KERENDIA 10 MG TABS Take 1 tablet by mouth daily.    Marland Kitchen levothyroxine (SYNTHROID) 25 MCG tablet TAKE 1 TABLET(25 MCG) BY MOUTH DAILY BEFORE AND BREAKFAST 90 tablet 1  . potassium chloride SA (KLOR-CON) 20 MEQ tablet Take 1 tablet (20 mEq total) by mouth daily. 90 tablet 3  . rosuvastatin (CRESTOR) 40 MG tablet TAKE 1 TABLET(40 MG) BY MOUTH DAILY 90 tablet 3  . traMADol (ULTRAM) 50 MG tablet TAKE 2 TABLETS BY MOUTH EVERY 6 HOURS FOR UP TO 7 DAYS AS NEEDED 30 tablet 0   No current facility-administered medications on file prior to visit.   ROS as in subjective    Objective: BP 140/82   Pulse 69   Ht '5\' 1"'$  (1.549 m)   Wt 175 lb 12.8 oz (79.7 kg)   LMP 03/23/2017   SpO2 98%   BMI 33.22 kg/m   General appearence: alert, no distress, WD/WN,  Neck: supple, no lymphadenopathy, no thyromegaly, no masses Heart: RRR, normal S1, S2, no murmurs Lungs: CTA bilaterally, no wheezes,  rhonchi, or rales Abdomen: +bs, soft, non tender, non distended, no masses, no hepatomegaly, no splenomegaly Pulses: 2+ symmetric, upper and lower extremities, normal cap refill She has mild generalized swelling and tenderness of the right knee, mild tenderness to the right lateral lower leg but no obvious deformity no obvious swelling of the lower legs otherwise.  Ankle range of motion normal.  She seems somewhat guarded in the right knee    Assessment: Encounter Diagnoses  Name Primary?  . Essential hypertension Yes  . Screen for colon cancer   . Vitamin D deficiency   . Normocytic normochromic anemia   . Type 2 diabetes mellitus with neurological complications ([place])   . IgA nephropathy   . Language barrier   . Hypothyroidism, unspecified type    . Hyperlipidemia, unspecified hyperlipidemia type       Plan: We discussed her symptoms and concerns I reminded her that she is due to see her kidney doctor at this time She will follow-up with orthopedics as planned Labs as below today.  We discussed the following recommendations which were also printed for her to have her daughter reference and review with her.    High blood pressure  Continue Cozaar/losartan 100 mg daily  Continue amlodipine 10 mg daily.  You noted that you are out of this  Continue Lasix 40 mg twice daily which is for fluid and blood pressure (continue your potassium chloride 20 mEQ's daily along with the Lasix)  You should also be taking Toprol-XL/metoprolol 25 mg daily  wright your blood pressure and pulse readings down in a notebook and take them with you to your upcoming kidney doctor appointment soon.  The goal is less than 130/80 for blood pressure  Avoid added salt  Diabetes  It sounds like your recent blood sugar numbers look good.  Continue to monitor your blood sugars with goal to be 80-130 fasting in the morning  Vitamin D deficiency  Continue vitamin D supplement 1000 units daily  High cholesterol  Continue rosuvastatin Crestor 40 mg daily   Hypothyroidism  Continue levothyroxine Synthroid 25 mcg daily first thing in the morning fasting about 45 minutes before breakfast   Kidney disease  Continue Kerendia daily and follow-up with your kidney doctor soon    Linda Black was seen today for follow-up.  Diagnoses and all orders for this visit:  Essential hypertension -     Renal Function Panel  Screen for colon cancer  Vitamin D deficiency  Normocytic normochromic anemia -     CBC  Type 2 diabetes mellitus with neurological complications (HCC)  IgA nephropathy  Language barrier  Hypothyroidism, unspecified type  Hyperlipidemia, unspecified hyperlipidemia type  Other orders -     amLODipine (NORVASC) 10 MG tablet;  Take 1 tablet (10 mg total) by mouth daily. -     metoprolol succinate (TOPROL-XL) 25 MG 24 hr tablet; 1 tablet po daily -     losartan (COZAAR) 100 MG tablet; Take 1 tablet (100 mg total) by mouth daily. -     furosemide (LASIX) 40 MG tablet; Take 1 tablet (40 mg total) by mouth 2 (two) times daily. -     gabapentin (NEURONTIN) 300 MG capsule; TAKE 1 CAPSULE(300 MG) BY MOUTH AT BEDTIME   F/ u pending labs

## 2020-12-18 ENCOUNTER — Other Ambulatory Visit: Payer: Self-pay | Admitting: Specialist

## 2020-12-18 LAB — RENAL FUNCTION PANEL
Albumin: 4.4 g/dL (ref 3.8–4.8)
BUN/Creatinine Ratio: 19 (ref 9–23)
BUN: 42 mg/dL — ABNORMAL HIGH (ref 6–24)
CO2: 17 mmol/L — ABNORMAL LOW (ref 20–29)
Calcium: 8.9 mg/dL (ref 8.7–10.2)
Chloride: 105 mmol/L (ref 96–106)
Creatinine, Ser: 2.2 mg/dL — ABNORMAL HIGH (ref 0.57–1.00)
Glucose: 82 mg/dL (ref 65–99)
Phosphorus: 3.8 mg/dL (ref 3.0–4.3)
Potassium: 4 mmol/L (ref 3.5–5.2)
Sodium: 140 mmol/L (ref 134–144)
eGFR: 27 mL/min/{1.73_m2} — ABNORMAL LOW (ref >59)

## 2020-12-18 LAB — CBC
Hematocrit: 37.9 % (ref 34.0–46.6)
Hemoglobin: 12.8 g/dL (ref 11.1–15.9)
MCH: 30.2 pg (ref 26.6–33.0)
MCHC: 33.8 g/dL (ref 31.5–35.7)
MCV: 89 fL (ref 79–97)
Platelets: 261 10*3/uL (ref 150–450)
RBC: 4.24 x10E6/uL (ref 3.77–5.28)
RDW: 12.7 % (ref 11.7–15.4)
WBC: 4.9 10*3/uL (ref 3.4–10.8)

## 2021-01-03 ENCOUNTER — Other Ambulatory Visit: Payer: Self-pay

## 2021-01-03 ENCOUNTER — Encounter: Payer: Self-pay | Admitting: Specialist

## 2021-01-03 ENCOUNTER — Ambulatory Visit (INDEPENDENT_AMBULATORY_CARE_PROVIDER_SITE_OTHER): Payer: Medicaid Other | Admitting: Specialist

## 2021-01-03 VITALS — BP 130/80 | HR 60 | Ht 61.0 in | Wt 176.0 lb

## 2021-01-03 DIAGNOSIS — M222X1 Patellofemoral disorders, right knee: Secondary | ICD-10-CM

## 2021-01-03 DIAGNOSIS — M4722 Other spondylosis with radiculopathy, cervical region: Secondary | ICD-10-CM

## 2021-01-03 DIAGNOSIS — N183 Chronic kidney disease, stage 3 unspecified: Secondary | ICD-10-CM

## 2021-01-03 DIAGNOSIS — M4807 Spinal stenosis, lumbosacral region: Secondary | ICD-10-CM

## 2021-01-03 DIAGNOSIS — M1711 Unilateral primary osteoarthritis, right knee: Secondary | ICD-10-CM

## 2021-01-03 DIAGNOSIS — M25461 Effusion, right knee: Secondary | ICD-10-CM

## 2021-01-03 NOTE — Progress Notes (Addendum)
Office Visit Note   Patient: Linda Black           Date of Birth: 1971-03-27           MRN: [id number] Visit Date: 01/03/2021              Requested by: Carlena Hurl, PA-C [address] [place],  [place] [address] PCP: Carlena Hurl, PA-C   Assessment & Plan: Visit Diagnoses:  1. Other spondylosis with radiculopathy, cervical region   2. Spinal stenosis of lumbosacral region   3. Stage 3 chronic kidney disease, unspecified whether stage 3a or 3b CKD ([place])   4. Effusion, right knee   5. Unilateral primary osteoarthritis, right knee   6. Patellofemoral pain syndrome of right knee     Plan:Avoid overhead lifting and overhead use of the arms. Do not lift greater than 5 lbs. Adjust head rest in vehicle to prevent hyperextension if rear ended. Take extra precautions to avoid falling. Avoid bending, stooping and avoid lifting weights greater than 10 lbs. Avoid prolong standing and walking. Avoid frequent bending and stooping  No lifting greater than 10 lbs. May use ice or moist heat for pain. Weight loss is of benefit. Handicap license is approved. Physical therapy: Cervical spondylosis and lumbar spinal stenosis.  Cervical traction, cervical McKenzie exercises, e stim, m, u/s. Lumbar flexion exercises, LE strengthening, hamstring stretching. This will be at [facility] outpatient PT.  Surgery is planned for right total knee replacement with Dr. Erlinda Hong.  Follow-Up Instructions: Return in about 4 weeks (around 01/31/2021).   Orders:  No orders of the defined types were placed in this encounter.  No orders of the defined types were placed in this encounter.     Procedures: No procedures performed   Clinical Data: Findings:  Study Result  Narrative & Impression CLINICAL DATA:  Chronic neck and right arm pain with numbness and weakness. Chronic low back and right leg pain with numbness and weakness. No prior surgery.  EXAM: CERVICAL AND LUMBAR  MYELOGRAM  CT CERVICAL MYELOGRAM  CT LUMBAR MYELOGRAM  FLUOROSCOPY TIME:  Radiation Exposure Index (as provided by the fluoroscopic device): 14.1 mGy  Fluoroscopy Time:  52 seconds  Number of Acquired Images:  24  PROCEDURE: LUMBAR PUNCTURE FOR CERVICAL AND LUMBAR MYELOGRAM  After thorough discussion of risks and benefits of the procedure including bleeding, infection, injury to nerves, blood vessels, adjacent structures as well as headache and CSF leak, written and oral informed consent was obtained. Consent was obtained by Dr. Fabiola Backer.  Patient was positioned prone on the fluoroscopy table. Local anesthesia was provided with 1% lidocaine without epinephrine after prepped and draped in the usual sterile fashion. Puncture was performed at L2-L3 using a 3 1/2 inch 22-gauge spinal needle via left interlaminar approach. Using a single pass through the dura, the needle was placed within the thecal sac, with return of clear CSF. 10 mL Isovue M-300 was injected into the thecal sac, with normal opacification of the nerve roots and cauda equina consistent with free flow within the subarachnoid space. The patient was then moved to the trendelenburg position and contrast flowed into the cervical spine region.  I personally performed the lumbar puncture and administered the intrathecal contrast. I also personally supervised acquisition of the myelogram images.  TECHNIQUE: Contiguous axial images were obtained through the cervical and lumbar spine after the intrathecal infusion of contrast. Coronal and sagittal reconstructions were obtained of the axial image sets.  COMPARISON:  Cervical spine x-rays dated November 29, 2020. MRI lumbar spine dated August 24, 2019.  FINDINGS: CERVICAL AND LUMBAR MYELOGRAM FINDINGS:  Cervical spine: Straightening of the normal cervical lordosis. Small ventral extradural defects from C3-C4 through C5-C6. Effacement  and underfilling of the right C5 and C6 nerve roots.  Lumbar spine: New trace anterolisthesis at L3-L4 and L4-L5 which mildly worsens with flexion. Small ventral extradural defects at L1-L2, L2-L3, and L3-L4. Moderate ventral extradural defect at L4-L5. Moderate spinal canal stenosis at L4-L5 with underfilling of the bilateral L5 nerve roots.  CT CERVICAL MYELOGRAM FINDINGS:  Alignment: Straightening and slight reversal of the normal cervical lordosis. No listhesis.  Skull base and vertebrae: No acute fracture or other focal pathologic process. Small C6 bone island.  Cord: Normal in bulk and morphology.  Soft tissues and upper chest: 6 mm hypodense nodule in the left thyroid lobe. Not clinically significant; no follow-up imaging recommended.  Disc levels:  C2-C3:  Negative.  C3-C4: Small posterior disc osteophyte complex. Mild right greater than left uncovertebral hypertrophy. No stenosis.  C4-C5: Small posterior disc osteophyte complex asymmetric to the left with flattening of the left ventral cord. Moderate right and mild left uncovertebral hypertrophy. Moderate right greater than left neuroforaminal stenosis.  C5-C6: Mild disc bulging. Mild right uncovertebral hypertrophy. Mild right neuroforaminal stenosis.  C6-C7:  Negative.  C7-T1:  Negative.  CT LUMBAR MYELOGRAM FINDINGS:  Segmentation: Standard.  Alignment: New trace anterolisthesis at L3-L4 and L4-L5.  Vertebrae: No acute fracture or other focal pathologic process.  Conus medullaris and cauda equina: Conus extends to the L1 level. There is some clumping of the cauda equina nerve roots due to downstream stenosis.  Paraspinal and other soft tissues: Negative.  Disc levels:  T11-T12: Negative.  T12-L1:  Negative.  L1-L2:  Unchanged mild disc bulging.  No stenosis.  L2-L3:  Unchanged mild disc bulging.  No stenosis.  L3-L4: Unchanged moderate disc bulging and moderate  bilateral facet arthropathy. Unchanged mild to moderate spinal canal stenosis. No neuroforaminal stenosis.  L4-L5: Unchanged moderate disc bulging and moderate bilateral facet arthropathy with ligamentum flavum hypertrophy. Unchanged moderate spinal canal stenosis and mild to moderate bilateral neuroforaminal stenosis.  L5-S1: Unchanged small broad-based right subarticular and foraminal disc protrusion with endplate spurring. Unchanged mild right lateral recess and and mild right neuroforaminal stenosis.  IMPRESSION: Cervical spine:  1. Multilevel cervical spondylosis as described above. Moderate right greater than left neuroforaminal stenosis at C4-C5. 2. Myelographic effacement and underfilling of the right C5 and C6 nerve roots.  Lumbar spine:  1. Unchanged moderate spinal canal and mild to moderate bilateral neuroforaminal stenosis at L4-L5. 2. Unchanged mild to moderate spinal canal stenosis at L3-L4. 3. New facet mediated trace anterolisthesis at L3-L4 and L4-L5 which mildly worsens with flexion.   Electronically Signed   By: Titus Dubin M.D.   On: 12/10/2020 14:15     Subjective: Chief Complaint  Patient presents with  . Neck - Follow-up    Myelogram Review  . Lower Back - Follow-up    Myelogram Review    50 year old right handed female with history of cervicalgia and lumbar pain. She has pain with movement of the neck side to side and with extension. No history of tobacco use, no weight loss. She experiences mainly posterior neck pain without UE weakness or pain. She complains of difficulty with prolong standing and walking. Tends to stoop and use cart when grocery shopping. No bowel or bladder difficulty. No leg or arm weakness.  No dropping of items. Underwent cervical and lumbar myelogram due to persistent neck and lumbar pain with radiographic signs or decreased space available for the spinal cord seen on her lateral cervical radiographs. She is to  undergo a right total knee replacement by Dr. Erlinda Hong.    Review of Systems  Constitutional: Negative.   HENT: Negative.    Eyes: Negative.   Respiratory: Negative.    Cardiovascular: Negative.   Gastrointestinal: Negative.   Endocrine: Negative.   Genitourinary: Negative.   Musculoskeletal: Negative.   Skin: Negative.   Allergic/Immunologic: Negative.   Neurological: Negative.   Hematological: Negative.   Psychiatric/Behavioral: Negative.       Objective: Vital Signs: BP 130/80 (BP Location: Left Arm, Patient Position: Sitting)   Pulse 60   Ht '5\' 1"'$  (1.549 m)   Wt 176 lb (79.8 kg)   LMP 03/23/2017   BMI 33.25 kg/m   Physical Exam Constitutional:      Appearance: She is well-developed.  HENT:     Head: Normocephalic and atraumatic.  Eyes:     Pupils: Pupils are equal, round, and reactive to light.  Pulmonary:     Effort: Pulmonary effort is normal.     Breath sounds: Normal breath sounds.  Abdominal:     General: Bowel sounds are normal.     Palpations: Abdomen is soft.  Musculoskeletal:     Cervical back: Normal range of motion and neck supple.     Lumbar back: Negative right straight leg raise test and negative left straight leg raise test.  Skin:    General: Skin is warm and dry.  Neurological:     Mental Status: She is alert and oriented to person, place, and time.  Psychiatric:        Behavior: Behavior normal.        Thought Content: Thought content normal.        Judgment: Judgment normal.    Back Exam   Tenderness  The patient is experiencing tenderness in the cervical and lumbar.  Range of Motion  Extension:  abnormal  Flexion:  abnormal  Lateral bend right:  abnormal  Lateral bend left:  abnormal  Rotation right:  abnormal  Rotation left:  abnormal   Muscle Strength  Right Quadriceps:  5/5  Left Quadriceps:  5/5  Right Hamstrings:  5/5  Left Hamstrings:  5/5   Tests  Straight leg raise right: negative Straight leg raise left:  negative  Reflexes  Patellar:  2/4 Achilles:  2/4 Biceps:  2/4  Other  Toe walk: normal Heel walk: normal Sensation: normal Gait: normal  Erythema: no back redness Scars: absent  Comments:  No focal motor deficit in either UE and LE. Cervical spine ROM decreased lateral bending and rotation by about 20%. Forward bending is normal as is her lumbar flexion.      Specialty Comments:  No specialty comments available.  Imaging: No results found.   PMFS History: Patient Active Problem List   Diagnosis Date Noted  . Chronic low back pain with sciatica 09/18/2020  . Encounter for health maintenance examination in adult 01/09/2020  . Radicular pain of right lower extremity 04/18/2019  . Other intervertebral disc degeneration, lumbar region 04/18/2019  . Radiculopathy of lumbosacral region 03/21/2019  . Right leg pain 03/21/2019  . Chest pain 09/23/2018  . Neuropathy involving both lower extremities 09/23/2018  . Screen for colon cancer 05/17/2018  . Vaccine counseling 05/17/2018  . Fatty liver 05/17/2018  . Screening for cervical  cancer 05/17/2018  . Hypothyroidism 05/17/2018  . Need for pneumococcal vaccination 05/17/2018  . Generalized abdominal tenderness 05/17/2018  . Need for influenza vaccination 04/13/2018  . Other fatigue 02/08/2018  . Edema 01/25/2018  . Renal failure (ARF), acute on chronic (HCC) 01/20/2018  . Normocytic normochromic anemia 01/20/2018  . Tremor 01/05/2018  . Vitamin D deficiency 01/05/2018  . RUQ abdominal pain 10/27/2017  . Hemorrhoids 10/27/2017  . High risk medication use 09/25/2017  . Type 2 diabetes mellitus with neurological complications ([place]) [id number]  . Proteinuria 09/02/2017  . Paresthesia 09/02/2017  . IgA nephropathy 06/10/2017  . Language barrier 06/10/2017  . Gastroesophageal reflux disease 06/10/2017  . SOB (shortness of breath) 02/24/2017  . Hyperlipidemia 04/21/2016  . Chronic right-sided low back pain with  right-sided sciatica 01/29/2016  . Morbid obesity ([place]) 01/29/2016  . Hypokalemia 01/29/2016  . Essential hypertension 01/29/2016   Past Medical History:  Diagnosis Date  . Diabetes mellitus without complication (HCC)    Dr. Kelton Pillar  . Fatty liver 2019   elevated LFTs, negative for Hep B and Hep C 04/2018  . GERD (gastroesophageal reflux disease)   . Hyperlipidemia   . Hypertension   . Hypothyroidism 2019  . IgA nephropathy 2019   per biopsy, Dr. Corliss Parish  . Language barrier   . Lumbar stenosis    Dr. Louanne Skye  . Obesity   . Osteoarthritis   . Proteinuria     Family History  Problem Relation Age of Onset  . Diabetes Mother   . Hypertension Father   . Colon cancer Neg Hx   . Stomach cancer Neg Hx   . Esophageal cancer Neg Hx   . Rectal cancer Neg Hx   . Liver cancer Neg Hx     Past Surgical History:  Procedure Laterality Date  . RENAL BIOPSY  2018   IgA nephropathy, Dr. Corliss Parish   Social History   Occupational History  . Occupation: homemaker  Tobacco Use  . Smoking status: Never Smoker  . Smokeless tobacco: Never Used  Vaping Use  . Vaping Use: Never used  Substance and Sexual Activity  . Alcohol use: Never  . Drug use: No  . Sexual activity: Not on file

## 2021-01-03 NOTE — Patient Instructions (Addendum)
Avoid overhead lifting and overhead use of the arms. Do not lift greater than 5 lbs. Adjust head rest in vehicle to prevent hyperextension if rear ended. Take extra precautions to avoid falling. Avoid bending, stooping and avoid lifting weights greater than 10 lbs. Avoid prolong standing and walking. Avoid frequent bending and stooping  No lifting greater than 10 lbs. May use ice or moist heat for pain. Weight loss is of benefit. Handicap license is approved. Physical therapy: Cervical spondylosis and lumbar spinal stenosis.  Cervical traction, cervical McKenzie exercises, e stim, m, u/s. Lumbar flexion exercises, LE strengthening, hamstring stretching. This will be at [facility] outpatient PT.  Surgery is planned for right total knee replacement with Dr. Erlinda Hong.

## 2021-01-10 ENCOUNTER — Other Ambulatory Visit: Payer: Self-pay | Admitting: Specialist

## 2021-01-21 ENCOUNTER — Ambulatory Visit: Payer: Medicaid Other

## 2021-01-30 NOTE — Progress Notes (Signed)
Surgical Instructions    Your procedure is scheduled on Monday July 11th.  Report to [facility] Main Entrance "A" at 8 A.M., then check in with the Admitting office.  Call this number if you have problems the morning of surgery:  [phone]   If you have any questions prior to your surgery date call [phone]: Open Monday-Friday 8am-4pm    Remember:  Do not eat after midnight the night before your surgery  You may drink clear liquids until 7am the morning of your surgery.   Clear liquids allowed are: Water, Non-Citrus Juices (without pulp), Carbonated Beverages, Clear Tea, Black Coffee Only, and Gatorade   Enhanced Recovery after Surgery for Orthopedics Enhanced Recovery after Surgery is a protocol used to improve the stress on your body and your recovery after surgery.  Patient Instructions   The day of surgery (if you have diabetes):  Drink ONE small 10 oz bottle of water by ___8__ am the morning of surgery This bottle was given to you during your hospital  pre-op appointment visit.  Nothing else to drink after completing the  Small 10 oz bottle of water.         If you have questions, please contact your surgeon's office.     Take these medicines the morning of surgery with A SIP OF WATER  amLODipine (NORVASC) 10 MG tablet KERENDIA 10 MG TABS levothyroxine (SYNTHROID) 25 MCG tablet metoprolol succinate (TOPROL-XL) 25 MG 24 hr tablet potassium chloride SA (KLOR-CON) 20 MEQ tablet rosuvastatin (CRESTOR) 40 MG tablet  IF NEEDED traMADol (ULTRAM) 50 MG tablet  As of today, STOP taking any Aspirin (unless otherwise instructed by your surgeon) Aleve, Naproxen, Ibuprofen, Motrin, Advil, Goody's, BC's, all herbal medications, fish oil, and all vitamins.  WHAT DO I DO ABOUT MY DIABETES MEDICATION?     HOW TO MANAGE YOUR DIABETES BEFORE AND AFTER SURGERY  Why is it important to control my blood sugar before and after surgery? Improving blood sugar levels before  and after surgery helps healing and can limit problems. A way of improving blood sugar control is eating a healthy diet by:  Eating less sugar and carbohydrates  Increasing activity/exercise  Talking with your doctor about reaching your blood sugar goals High blood sugars (greater than 180 mg/dL) can raise your risk of infections and slow your recovery, so you will need to focus on controlling your diabetes during the weeks before surgery. Make sure that the doctor who takes care of your diabetes knows about your planned surgery including the date and location.  How do I manage my blood sugar before surgery? Check your blood sugar at least 4 times a day, starting 2 days before surgery, to make sure that the level is not too high or low.  Check your blood sugar the morning of your surgery when you wake up and every 2 hours until you get to the Short Stay unit.  If your blood sugar is less than 70 mg/dL, you will need to treat for low blood sugar: Do not take insulin. Treat a low blood sugar (less than 70 mg/dL) with  cup of clear juice (cranberry or apple), 4 glucose tablets, OR glucose gel. Recheck blood sugar in 15 minutes after treatment (to make sure it is greater than 70 mg/dL). If your blood sugar is not greater than 70 mg/dL on recheck, call [phone] for further instructions. Report your blood sugar to the short stay nurse when you get to Short Stay.  If you are  admitted to the hospital after surgery: Your blood sugar will be checked by the staff and you will probably be given insulin after surgery (instead of oral diabetes medicines) to make sure you have good blood sugar levels. The goal for blood sugar control after surgery is 80-180 mg/dL.           Do not wear jewelry  Do not wear lotions, powders, colognes, or deodorant. Do not shave 48 hours prior to surgery.  Men may shave face and neck. Do not bring valuables to the hospital. DO Not wear nail polish, gel polish,  artificial nails, or any other type of covering on  natural nails including finger and toenails. If patients have artificial nails, gel coating, etc. that need to be removed by a nail salon please have this removed prior to surgery or surgery may need to be canceled/delayed if the surgeon/ anesthesia feels like the patient is unable to be adequately monitored.             [facility] is not responsible for any belongings or valuables.  Do NOT Smoke (Tobacco/Vaping) or drink Alcohol 24 hours prior to your procedure If you use a CPAP at night, you may bring all equipment for your overnight stay.   Contacts, glasses, dentures or bridgework may not be worn into surgery, please bring cases for these belongings   For patients admitted to the hospital, discharge time will be determined by your treatment team.   Patients discharged the day of surgery will not be allowed to drive home, and someone needs to stay with them for 24 hours.  ONLY 1 SUPPORT PERSON MAY BE PRESENT WHILE YOU ARE IN SURGERY. IF YOU ARE TO BE ADMITTED ONCE YOU ARE IN YOUR ROOM YOU WILL BE ALLOWED TWO (2) VISITORS.  Minor children may have two parents present. Special consideration for safety and communication needs will be reviewed on a case by case basis.  Special instructions:    Oral Hygiene is also important to reduce your risk of infection.  Remember - BRUSH YOUR TEETH THE MORNING OF SURGERY WITH YOUR REGULAR TOOTHPASTE   [facility]- Preparing For Surgery  Before surgery, you can play an important role. Because skin is not sterile, your skin needs to be as free of germs as possible. You can reduce the number of germs on your skin by washing with CHG (chlorahexidine gluconate) Soap before surgery.  CHG is an antiseptic cleaner which kills germs and bonds with the skin to continue killing germs even after washing.     Please do not use if you have an allergy to CHG or antibacterial soaps. If your skin becomes  reddened/irritated stop using the CHG.  Do not shave (including legs and underarms) for at least 48 hours prior to first CHG shower. It is OK to shave your face.  Please follow these instructions carefully.     Shower the NIGHT BEFORE SURGERY and the MORNING OF SURGERY with CHG Soap.   If you chose to wash your hair, wash your hair first as usual with your normal shampoo. After you shampoo, rinse your hair and body thoroughly to remove the shampoo.  Then [employer] and genitals (private parts) with your normal soap and rinse thoroughly to remove soap.  After that Use CHG Soap as you would any other liquid soap. You can apply CHG directly to the skin and wash gently with a scrungie or a clean washcloth.   Apply the CHG Soap to your  body ONLY FROM THE NECK DOWN.  Do not use on open wounds or open sores. Avoid contact with your eyes, ears, mouth and genitals (private parts). Wash Face and genitals (private parts)  with your normal soap.   Wash thoroughly, paying special attention to the area where your surgery will be performed.  Thoroughly rinse your body with warm water from the neck down.  DO NOT shower/wash with your normal soap after using and rinsing off the CHG Soap.  Pat yourself dry with a CLEAN TOWEL.  Wear CLEAN PAJAMAS to bed the night before surgery  Place CLEAN SHEETS on your bed the night before your surgery  DO NOT SLEEP WITH PETS.   Day of Surgery:  Take a shower with CHG soap. Wear Clean/Comfortable clothing the morning of surgery Do not apply any deodorants/lotions.   Remember to brush your teeth WITH YOUR REGULAR TOOTHPASTE.   Please read over the following fact sheets that you were given.

## 2021-01-31 ENCOUNTER — Encounter (HOSPITAL_COMMUNITY): Payer: Self-pay

## 2021-01-31 ENCOUNTER — Other Ambulatory Visit: Payer: Self-pay

## 2021-01-31 ENCOUNTER — Encounter (HOSPITAL_COMMUNITY)
Admission: RE | Admit: 2021-01-31 | Discharge: 2021-01-31 | Disposition: A | Discharge status: Home / Self Care | Payer: Medicaid Other | Source: Ambulatory Visit | Attending: Orthopaedic Surgery | Admitting: Orthopaedic Surgery

## 2021-01-31 ENCOUNTER — Ambulatory Visit (HOSPITAL_COMMUNITY)
Admission: RE | Admit: 2021-01-31 | Discharge: 2021-01-31 | Disposition: A | Discharge status: Home / Self Care | Payer: Medicaid Other | Source: Ambulatory Visit | Attending: Physician Assistant | Admitting: Physician Assistant

## 2021-01-31 DIAGNOSIS — M1711 Unilateral primary osteoarthritis, right knee: Secondary | ICD-10-CM

## 2021-01-31 HISTORY — DX: Inflammatory liver disease, unspecified: K75.9

## 2021-01-31 LAB — CBC WITH DIFFERENTIAL/PLATELET
Abs Immature Granulocytes: 0.01 10*3/uL (ref 0.00–0.07)
Basophils Absolute: 0 10*3/uL (ref 0.0–0.1)
Basophils Relative: 1 %
Eosinophils Absolute: 0.1 10*3/uL (ref 0.0–0.5)
Eosinophils Relative: 2 %
HCT: 43 % (ref 36.0–46.0)
Hemoglobin: 14 g/dL (ref 12.0–15.0)
Immature Granulocytes: 0 %
Lymphocytes Relative: 32 %
Lymphs Abs: 2.1 10*3/uL (ref 0.7–4.0)
MCH: 29.7 pg (ref 26.0–34.0)
MCHC: 32.6 g/dL (ref 30.0–36.0)
MCV: 91.1 fL (ref 80.0–100.0)
Monocytes Absolute: 0.4 10*3/uL (ref 0.1–1.0)
Monocytes Relative: 6 %
Neutro Abs: 3.8 10*3/uL (ref 1.7–7.7)
Neutrophils Relative %: 59 %
Platelets: 288 10*3/uL (ref 150–400)
RBC: 4.72 MIL/uL (ref 3.87–5.11)
RDW: 11.8 % (ref 11.5–15.5)
WBC: 6.4 10*3/uL (ref 4.0–10.5)
nRBC: 0 % (ref 0.0–0.2)

## 2021-01-31 LAB — URINALYSIS, ROUTINE W REFLEX MICROSCOPIC
Bacteria, UA: NONE SEEN
Bilirubin Urine: NEGATIVE
Glucose, UA: NEGATIVE mg/dL
Hgb urine dipstick: NEGATIVE
Ketones, ur: NEGATIVE mg/dL
Leukocytes,Ua: NEGATIVE
Nitrite: NEGATIVE
Protein, ur: 100 mg/dL — AB
Specific Gravity, Urine: 1.012 (ref 1.005–1.030)
pH: 6 (ref 5.0–8.0)

## 2021-01-31 LAB — PROTIME-INR
INR: 1 (ref 0.8–1.2)
Prothrombin Time: 13 seconds (ref 11.4–15.2)

## 2021-01-31 LAB — TYPE AND SCREEN
ABO/RH(D): A POS
Antibody Screen: NEGATIVE

## 2021-01-31 LAB — SURGICAL PCR SCREEN
MRSA, PCR: NEGATIVE
Staphylococcus aureus: NEGATIVE

## 2021-01-31 LAB — COMPREHENSIVE METABOLIC PANEL
ALT: 21 U/L (ref 0–44)
AST: 20 U/L (ref 15–41)
Albumin: 4.4 g/dL (ref 3.5–5.0)
Alkaline Phosphatase: 74 U/L (ref 38–126)
Anion gap: 11 (ref 5–15)
BUN: 32 mg/dL — ABNORMAL HIGH (ref 6–20)
CO2: 20 mmol/L — ABNORMAL LOW (ref 22–32)
Calcium: 9.4 mg/dL (ref 8.9–10.3)
Chloride: 110 mmol/L (ref 98–111)
Creatinine, Ser: 2.49 mg/dL — ABNORMAL HIGH (ref 0.44–1.00)
GFR, Estimated: 23 mL/min — ABNORMAL LOW (ref >60)
Glucose, Bld: 137 mg/dL — ABNORMAL HIGH (ref 70–99)
Potassium: 4.2 mmol/L (ref 3.5–5.1)
Sodium: 141 mmol/L (ref 135–145)
Total Bilirubin: 1 mg/dL (ref 0.3–1.2)
Total Protein: 7.9 g/dL (ref 6.5–8.1)

## 2021-01-31 LAB — GLUCOSE, CAPILLARY: Glucose-Capillary: 149 mg/dL — ABNORMAL HIGH (ref 70–99)

## 2021-01-31 LAB — APTT: aPTT: 30 seconds (ref 24–36)

## 2021-01-31 NOTE — Progress Notes (Signed)
PCP - Dr. Chana Bode Cardiologist - denies  PPM/ICD - denies Device Orders -  Rep Notified -   Chest x-ray - 01/31/21 EKG - 01/31/21 Stress Test - denies ECHO - denies Cardiac Cath - denies  Sleep Study - denies CPAP -   Fasting Blood Sugar - does not check blood sugar Checks Blood Sugar _____ times a day  Blood Thinner Instructions:n/a Aspirin Instructions:n/a  ERAS Protcol -clears until 0700 PRE-SURGERY Ensure or G2- G2  COVID TEST- scheduled 02/07/21   Anesthesia review: yes-creatinine 2.49. Followed by Francina Ames.  Patient denies shortness of breath, fever, cough and chest pain at PAT appointment   All instructions explained to the patient, with a verbal understanding of the material. Patient agrees to go over the instructions while at home for a better understanding. Patient also instructed to self quarantine after being tested for COVID-19. The opportunity to ask questions was provided.

## 2021-01-31 NOTE — Progress Notes (Signed)
Surgical Instructions    Your procedure is scheduled on Monday July 11th.  Report to [facility] Main Entrance "A" at 8 A.M., then check in with the Admitting office.  Call this number if you have problems the morning of surgery:  [phone]   If you have any questions prior to your surgery date call [phone]: Open Monday-Friday 8am-4pm    Remember:  Do not eat after midnight the night before your surgery  You may drink clear liquids until 7am the morning of your surgery.   Clear liquids allowed are: Water, Non-Citrus Juices (without pulp), Carbonated Beverages, Clear Tea, Black Coffee Only, and Gatorade   Enhanced Recovery after Surgery for Orthopedics Enhanced Recovery after Surgery is a protocol used to improve the stress on your body and your recovery after surgery.  Patient Instructions   The day of surgery (if you have diabetes):  Drink 12 ounce G2  by __7_ am the morning of surgery This bottle was given to you during your hospital  pre-op appointment visit.  Nothing else to drink after completing the G2 drink          If you have questions, please contact your surgeon's office.     Take these medicines the morning of surgery with A SIP OF WATER  amLODipine (NORVASC) 10 MG tablet KERENDIA 10 MG TABS levothyroxine (SYNTHROID) 25 MCG tablet metoprolol succinate (TOPROL-XL) 25 MG 24 hr tablet rosuvastatin (CRESTOR) 40 MG tablet  IF NEEDED traMADol (ULTRAM) 50 MG tablet  As of today, STOP taking any Aspirin (unless otherwise instructed by your surgeon) Aleve, Naproxen, Ibuprofen, Motrin, Advil, Goody's, BC's, all herbal medications, fish oil, and all vitamins.  WHAT DO I DO ABOUT MY DIABETES MEDICATION?     HOW TO MANAGE YOUR DIABETES BEFORE AND AFTER SURGERY  Why is it important to control my blood sugar before and after surgery? Improving blood sugar levels before and after surgery helps healing and can limit problems. A way of improving blood sugar  control is eating a healthy diet by:  Eating less sugar and carbohydrates  Increasing activity/exercise  Talking with your doctor about reaching your blood sugar goals High blood sugars (greater than 180 mg/dL) can raise your risk of infections and slow your recovery, so you will need to focus on controlling your diabetes during the weeks before surgery. Make sure that the doctor who takes care of your diabetes knows about your planned surgery including the date and location.  How do I manage my blood sugar before surgery? Check your blood sugar at least 4 times a day, starting 2 days before surgery, to make sure that the level is not too high or low.  Check your blood sugar the morning of your surgery when you wake up and every 2 hours until you get to the Short Stay unit.  If your blood sugar is less than 70 mg/dL, you will need to treat for low blood sugar: Do not take insulin. Treat a low blood sugar (less than 70 mg/dL) with  cup of clear juice (cranberry or apple), 4 glucose tablets, OR glucose gel. Recheck blood sugar in 15 minutes after treatment (to make sure it is greater than 70 mg/dL). If your blood sugar is not greater than 70 mg/dL on recheck, call [phone] for further instructions. Report your blood sugar to the short stay nurse when you get to Short Stay.  If you are admitted to the hospital after surgery: Your blood sugar will be checked by the  staff and you will probably be given insulin after surgery (instead of oral diabetes medicines) to make sure you have good blood sugar levels. The goal for blood sugar control after surgery is 80-180 mg/dL.           Do not wear jewelry  Do not wear lotions, powders, colognes, or deodorant. Do not shave 48 hours prior to surgery.  Men may shave face and neck. Do not bring valuables to the hospital. DO Not wear nail polish, gel polish, artificial nails, or any other type of covering on  natural nails including finger and  toenails. If patients have artificial nails, gel coating, etc. that need to be removed by a nail salon please have this removed prior to surgery or surgery may need to be canceled/delayed if the surgeon/ anesthesia feels like the patient is unable to be adequately monitored.             [facility] is not responsible for any belongings or valuables.  Do NOT Smoke (Tobacco/Vaping) or drink Alcohol 24 hours prior to your procedure If you use a CPAP at night, you may bring all equipment for your overnight stay.   Contacts, glasses, dentures or bridgework may not be worn into surgery, please bring cases for these belongings   For patients admitted to the hospital, discharge time will be determined by your treatment team.   Patients discharged the day of surgery will not be allowed to drive home, and someone needs to stay with them for 24 hours.  ONLY 1 SUPPORT PERSON MAY BE PRESENT WHILE YOU ARE IN SURGERY. IF YOU ARE TO BE ADMITTED ONCE YOU ARE IN YOUR ROOM YOU WILL BE ALLOWED TWO (2) VISITORS.  Minor children may have two parents present. Special consideration for safety and communication needs will be reviewed on a case by case basis.  Special instructions:    Oral Hygiene is also important to reduce your risk of infection.  Remember - BRUSH YOUR TEETH THE MORNING OF SURGERY WITH YOUR REGULAR TOOTHPASTE   [place]- Preparing For Surgery  Before surgery, you can play an important role. Because skin is not sterile, your skin needs to be as free of germs as possible. You can reduce the number of germs on your skin by washing with CHG (chlorahexidine gluconate) Soap before surgery.  CHG is an antiseptic cleaner which kills germs and bonds with the skin to continue killing germs even after washing.     Please do not use if you have an allergy to CHG or antibacterial soaps. If your skin becomes reddened/irritated stop using the CHG.  Do not shave (including legs and underarms) for at least 48  hours prior to first CHG shower. It is OK to shave your face.  Please follow these instructions carefully.     Shower the NIGHT BEFORE SURGERY and the MORNING OF SURGERY with CHG Soap.   If you chose to wash your hair, wash your hair first as usual with your normal shampoo. After you shampoo, rinse your hair and body thoroughly to remove the shampoo.  Then [employer] and genitals (private parts) with your normal soap and rinse thoroughly to remove soap.  After that Use CHG Soap as you would any other liquid soap. You can apply CHG directly to the skin and wash gently with a scrungie or a clean washcloth.   Apply the CHG Soap to your body ONLY FROM THE NECK DOWN.  Do not use on open wounds or  open sores. Avoid contact with your eyes, ears, mouth and genitals (private parts). Wash Face and genitals (private parts)  with your normal soap.   Wash thoroughly, paying special attention to the area where your surgery will be performed.  Thoroughly rinse your body with warm water from the neck down.  DO NOT shower/wash with your normal soap after using and rinsing off the CHG Soap.  Pat yourself dry with a CLEAN TOWEL.  Wear CLEAN PAJAMAS to bed the night before surgery  Place CLEAN SHEETS on your bed the night before your surgery  DO NOT SLEEP WITH PETS.   Day of Surgery:  Take a shower with CHG soap. Wear Clean/Comfortable clothing the morning of surgery Do not apply any deodorants/lotions.   Remember to brush your teeth WITH YOUR REGULAR TOOTHPASTE.   Please read over the following fact sheets that you were given.

## 2021-01-31 NOTE — Progress Notes (Signed)
Fyi-Not the best renal function

## 2021-01-31 NOTE — Progress Notes (Signed)
PCP - Chana Bode MD Cardiologist - denies  PPM/ICD - denies  Device Orders -  Rep Notified -   Chest x-ray - 01/31/21 EKG - 01/31/21 Stress Test - denies ECHO - denies Cardiac Cath -denies   Sleep Study - denies CPAP -   Fasting Blood Sugar - doesn't check Checks Blood Sugar _____ times a day  Blood Thinner Instructions:n/a Aspirin Instructions:n/a  ERAS Protcol -clear liquids until 7am PRE-SURGERY Ensure or G2- G2  COVID TEST- scheduled 02/07/21   Anesthesia review: yes, elevated creatinine  Patient denies shortness of breath, fever, cough and chest pain at PAT appointment   All instructions explained to the patient, with a verbal understanding of the material. Patient agrees to go over the instructions while at home for a better understanding. Patient also instructed to self quarantine after being tested for COVID-19. The opportunity to ask questions was provided.

## 2021-02-01 ENCOUNTER — Other Ambulatory Visit: Payer: Self-pay

## 2021-02-01 NOTE — Progress Notes (Signed)
Anesthesia Chart Review:  Case: [id number] Date/Time: 02/11/21 0950   Procedure: RIGHT TOTAL KNEE ARTHROPLASTY-CEMENTED (Right: Knee)   Anesthesia type: Spinal   Pre-op diagnosis: right knee degenerative joint disease   Location: [facility] OR ROOM 04 / [place] OR   Surgeons: Leandrew Koyanagi, MD       DISCUSSION: Patient is a 50 year old Black scheduled for the above procedure.  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 She had angioedema/anaphylaxis in the setting of ACEi and jackfruit, and was advised to avoid both (01/2018). BMI is consistent with obesity.   Last evaluation with nephrologist Dr. Moshe Cipro was on 12/26/20 (scanned under Media tab). Reviewed 12/17/20 labs showing Creatinine 2.2, albumin 4.4, HGB 12.8. Patient/daughter discussed the TKR likely planned for July. 01/31/21 PAT Creatinine 2.49, BUN 32, eGFR 23, previous labs on 12/17/20 showed Creatinine 2.20, BUN 42, eGFR25. (Creatinine range of 1.57-2.67 and eGFR 20-39 since 01/2018 in [facility].) Creatinine was called to surgeon's office.    Last A1c 5.8% on 09/18/20. She had been on Lantus ~ 2019 when on prednisone for IgA nephropathy. Her A1c has been in the 5.6-6.1% since 05/2018 and under 6% since 07/2019. Random glucose with PAT labs was 137. Given she has been in the pre-diabetic range for several years now and last test was less than five months ago, I do not think she needs a repeat preoperative A1c from an anesthesia standpoint. She will get a CBG on arrival for surgery.   Preoperative COVID-19 test is scheduled for 02/07/2021. Anesthesia team to evaluate on the day of surgery.    VS: BP (!) 152/92   Pulse 73   Temp 36.7 C (Oral)   Resp 18   Ht $R'5\' 1"'UV$  (1.549 m)   Wt 79.7 kg   LMP 03/23/2017   SpO2 100%   BMI Linda.22 kg/m     PROVIDERS: Carlena Hurl, PA-C is PCP. Last visit 12/17/20.  Corliss Parish, MD is nephrologist Collier Flowers, MD is endocrinologist. Last visit seen 09/10/18.   LABS: Preoperative labs noted. See DISCUSSION. A1c 5.8% 09/18/20. (all labs ordered are listed, but only abnormal results are displayed)  Labs Reviewed  GLUCOSE, CAPILLARY - Abnormal; Notable for the following components:      Result Value   Glucose-Capillary 149 (*)    All other components within normal limits  COMPREHENSIVE METABOLIC PANEL - Abnormal; Notable for the following components:   CO2 20 (*)    Glucose, Bld 137 (*)    BUN 32 (*)    Creatinine, Ser 2.49 (*)    GFR, Estimated 23 (*)    All other components within normal limits  URINALYSIS, ROUTINE W REFLEX MICROSCOPIC - Abnormal; Notable for the following components:   Protein, ur 100 (*)    All other components within normal limits  SURGICAL PCR SCREEN  CBC WITH DIFFERENTIAL/PLATELET  PROTIME-INR  APTT  TYPE AND SCREEN     IMAGES: CXR 01/31/21: FINDINGS: The heart size and mediastinal contours are within normal limits. Both lungs are clear. The visualized skeletal structures are unremarkable except for degenerative changes of the thoracic spine. No acute osseous finding. Trachea midline. IMPRESSION: No active cardiopulmonary disease.   CT C-spine & L-spine 12/10/20: IMPRESSION: Cervical spine: 1. Multilevel cervical spondylosis as described above. Moderate right greater than left neuroforaminal stenosis at C4-C5. 2. Myelographic  effacement and underfilling of the right C5 and C6 nerve roots. Lumbar spine: 1. Unchanged moderate spinal canal and mild to moderate bilateral neuroforaminal stenosis at L4-L5. 2. Unchanged mild to moderate spinal canal stenosis at L3-L4. 3. New facet mediated trace anterolisthesis at L3-L4 and L4-L5 which mildly worsens with flexion.   [place] Abd 09/28/18: IMPRESSION: 1. Hepatic steatosis. 2. Normal  sonographic appearance of the gallbladder with no evidence for cholelithiasis, acute cholecystitis, or biliary dilatation. 3. Increased echogenicity within the renal parenchyma bilaterally, compatible with medical renal disease. No hydronephrosis.   EKG: 01/31/21: Normal sinus rhythm Nonspecific T wave abnormality No significant change since last tracing Confirmed by Minus Breeding [phone]) on 01/31/2021 3:56:42 PM   CV: N/A  Past Medical History:  Diagnosis Date   Diabetes mellitus without complication ([place])    Dr. Kelton Pillar   Fatty liver 2019   elevated LFTs, negative for Hep B and Hep C 04/2018   GERD (gastroesophageal reflux disease)    Hepatitis    Hyperlipidemia    Hypertension    Hypothyroidism 2019   IgA nephropathy 2019   per biopsy, Dr. Corliss Parish   Language barrier    Lumbar stenosis    Dr. Louanne Skye   Obesity    Osteoarthritis    Proteinuria     Past Surgical History:  Procedure Laterality Date   RENAL BIOPSY  2018   IgA nephropathy, Dr. Corliss Parish    MEDICATIONS:  ACCU-CHEK AVIVA PLUS test strip   acetaminophen (TYLENOL) 500 MG tablet   amLODipine (NORVASC) 10 MG tablet   cholecalciferol (VITAMIN D3) 25 MCG (1000 UNIT) tablet   furosemide (LASIX) 40 MG tablet   gabapentin (NEURONTIN) 300 MG capsule   Insulin Pen Needle 32G X 4 MM MISC   KERENDIA 10 MG TABS   levothyroxine (SYNTHROID) 25 MCG tablet   losartan (COZAAR) 100 MG tablet   metoprolol succinate (TOPROL-XL) 25 MG 24 hr tablet   potassium chloride SA (KLOR-CON) 20 MEQ tablet   rosuvastatin (CRESTOR) 40 MG tablet   traMADol (ULTRAM) 50 MG tablet   No current facility-administered medications for this encounter.    Myra Gianotti, PA-C Surgical Short Stay/Anesthesiology [facility] Phone [phone] [facility] Phone [phone] 02/01/2021 11:09 AM

## 2021-02-01 NOTE — Anesthesia Preprocedure Evaluation (Addendum)
Anesthesia Evaluation  Patient identified by MRN, date of birth, ID band Patient awake    Reviewed: Allergy & Precautions, NPO status , Patient's Chart, lab work & pertinent test results  History of Anesthesia Complications Negative for: history of anesthetic complications  Airway Mallampati: II  TM Distance: >3 FB Neck ROM: Full    Dental  (+) Dental Advisory Given, Teeth Intact   Pulmonary neg pulmonary ROS,  Covid-19 Nucleic Acid Test Results Lab Results      Component                Value               Date                      SARSCOV2NAA              NEGATIVE            02/07/2021              breath sounds clear to auscultation       Cardiovascular hypertension, Pt. on medications and Pt. on home beta blockers (-) angina(-) Past MI and (-) CHF  Rhythm:Regular     Neuro/Psych negative neurological ROS  negative psych ROS   GI/Hepatic GERD  Medicated and Controlled,  Endo/Other  diabetes, Insulin DependentHypothyroidism   Renal/GU CRFRenal diseaseLab Results      Component                Value               Date                      CREATININE               2.49 (H)            01/31/2021           Lab Results      Component                Value               Date                      K                        4.2                 01/31/2021                Musculoskeletal   Abdominal   Peds  Hematology  (+) Blood dyscrasia, anemia , ptt 30  Lab Results      Component                Value               Date                      WBC                      6.4                 01/31/2021                HGB  14.0                01/31/2021                HCT                      43.0                01/31/2021                MCV                      91.1                01/31/2021                PLT                      288                 01/31/2021            Lab Results      Component                 Value               Date                      INR                      1.0                 01/31/2021                INR                      0.96                03/27/2017            Denies blood thinners   Anesthesia Other Findings  History includes never smoker, HTN, hypothyroidism, HLD, DM2, CKD with IgA nephropathy (s/p prednisone, Cytoxan 2018, now off), transaminitis (2019, negative Hepatitis B & C screening tests 04/13/18 hepatic steatosis by [place] 2020), language barrier ([place]). She had angioedema/anaphylaxis in the setting of ACEi and jackfruit, and was advised to avoid both (01/2018). BMI is consistent with obesity.  Last evaluation with nephrologist Dr. Goldsborough was on 12/26/20 (scanned under Media tab). Reviewed 12/17/20 labs showing Creatinine 2.2, albumin 4.4, HGB 12.8. Patient/daughter discussed the TKR likely planned for July. 01/31/21 PAT Creatinine 2.49, BUN 32, eGFR 23, previous labs on 12/17/20 showed Creatinine 2.20, BUN 42, eGFR25. (Creatinine range of 1.57-2.67 and eGFR 20-39 since 01/2018 in [facility].) Creatinine was called to surgeon's office.    Last A1c 5.8% on 09/18/20. She had been on Lantus ~ 2019 when on prednisone for IgA nephropathy. Her A1c has been in the 5.6-6.1% since 05/2018 and under 6% since 07/2019. Random glucose with PAT labs was 137. Given she has been in the pre-diabetic range for several years now and last test was less than five months ago, I do not think she needs a repeat preoperative A1c from an anesthesia standpoint. She will get a CBG on arrival for surgery.   Reproductive/Obstetrics                              Anesthesia Physical Anesthesia Plan  ASA: 3  Anesthesia Plan: MAC, Regional and Spinal   Post-op Pain Management:    Induction:   PONV Risk Score and Plan: 2 and Propofol infusion and Treatment may vary due to age or medical condition  Airway Management Planned: Nasal Cannula  Additional Equipment:  None  Intra-op Plan:   Post-operative Plan:   Informed Consent: I have reviewed the patients History and Physical, chart, labs and discussed the procedure including the risks, benefits and alternatives for the proposed anesthesia with the patient or authorized representative who has indicated his/her understanding and acceptance.     Dental advisory given  Plan Discussed with: CRNA and Surgeon  Anesthesia Plan Comments: (PAT note written 02/01/2021 by Myra Gianotti, PA-C. )       Anesthesia Quick Evaluation

## 2021-02-01 NOTE — Progress Notes (Signed)
Notified Debbie at Dr. Phoebe Sharps office of pt's Creatinine of 2.49.

## 2021-02-06 ENCOUNTER — Other Ambulatory Visit: Payer: Self-pay | Admitting: Physician Assistant

## 2021-02-06 ENCOUNTER — Telehealth: Payer: Self-pay

## 2021-02-06 ENCOUNTER — Other Ambulatory Visit: Payer: Self-pay

## 2021-02-06 DIAGNOSIS — M1711 Unilateral primary osteoarthritis, right knee: Secondary | ICD-10-CM

## 2021-02-06 MED ORDER — SULFAMETHOXAZOLE-TRIMETHOPRIM 800-160 MG PO TABS: 1.0000 | ORAL_TABLET | Freq: Two times a day (BID) | ORAL | 0 refills | Status: DC

## 2021-02-06 MED ORDER — DOCUSATE SODIUM 100 MG PO CAPS: 100.0000 mg | ORAL_CAPSULE | Freq: Every day | ORAL | 2 refills | Status: DC | PRN

## 2021-02-06 MED ORDER — ONDANSETRON HCL 4 MG PO TABS: 4.0000 mg | ORAL_TABLET | Freq: Three times a day (TID) | ORAL | 0 refills | Status: DC | PRN

## 2021-02-06 MED ORDER — ASPIRIN EC 81 MG PO TBEC: 81.0000 mg | DELAYED_RELEASE_TABLET | Freq: Two times a day (BID) | ORAL | 0 refills | Status: DC

## 2021-02-06 MED ORDER — METHOCARBAMOL 500 MG PO TABS: 500.0000 mg | ORAL_TABLET | Freq: Two times a day (BID) | ORAL | 0 refills | Status: DC | PRN

## 2021-02-06 MED ORDER — OXYCODONE-ACETAMINOPHEN 5-325 MG PO TABS: 1.0000 | ORAL_TABLET | Freq: Four times a day (QID) | ORAL | 0 refills | Status: DC | PRN

## 2021-02-06 NOTE — Telephone Encounter (Signed)
**  order made

## 2021-02-06 NOTE — Telephone Encounter (Signed)
Appt made

## 2021-02-06 NOTE — Telephone Encounter (Signed)
Patient will have surgery 02/11/2021-R TKA. Sonia Side states Patient has Medicaid [facility] and will only cover 1 visit. Then we have to wait 5-7 days for ins. to review and approve more visits. Sonia Side states to go ahead and send them to outpatient PT so there is no delay.  Do you want for me to go ahead and put order for outpatient PT?

## 2021-02-06 NOTE — Telephone Encounter (Signed)
That sounds fine.  We can go ahead and make an appointment for the [facility] outpatient PT

## 2021-02-07 ENCOUNTER — Other Ambulatory Visit (HOSPITAL_COMMUNITY)
Admission: RE | Admit: 2021-02-07 | Discharge: 2021-02-07 | Disposition: A | Discharge status: Home / Self Care | Payer: Medicaid Other | Source: Ambulatory Visit | Attending: Orthopaedic Surgery | Admitting: Orthopaedic Surgery

## 2021-02-07 DIAGNOSIS — Z01812 Encounter for preprocedural laboratory examination: Secondary | ICD-10-CM | POA: Insufficient documentation

## 2021-02-07 DIAGNOSIS — Z20822 Contact with and (suspected) exposure to covid-19: Secondary | ICD-10-CM | POA: Insufficient documentation

## 2021-02-08 LAB — SARS CORONAVIRUS 2 (TAT 6-24 HRS): SARS Coronavirus 2: NEGATIVE

## 2021-02-11 ENCOUNTER — Observation Stay (HOSPITAL_COMMUNITY): Payer: Medicaid Other

## 2021-02-11 ENCOUNTER — Ambulatory Visit (HOSPITAL_COMMUNITY): Payer: Medicaid Other | Admitting: Registered Nurse

## 2021-02-11 ENCOUNTER — Other Ambulatory Visit: Payer: Self-pay

## 2021-02-11 ENCOUNTER — Observation Stay (HOSPITAL_COMMUNITY)
Admission: RE | Admit: 2021-02-11 | Discharge: 2021-02-12 | Disposition: A | Discharge status: Home / Self Care | Payer: Medicaid Other | Attending: Orthopaedic Surgery | Admitting: Orthopaedic Surgery

## 2021-02-11 ENCOUNTER — Ambulatory Visit (HOSPITAL_COMMUNITY): Payer: Medicaid Other | Admitting: Vascular Surgery

## 2021-02-11 ENCOUNTER — Encounter (HOSPITAL_COMMUNITY): Payer: Self-pay | Admitting: Orthopaedic Surgery

## 2021-02-11 ENCOUNTER — Encounter (HOSPITAL_COMMUNITY)
Admission: RE | Disposition: A | Discharge status: Home / Self Care | Payer: Self-pay | Source: Home / Self Care | Attending: Orthopaedic Surgery

## 2021-02-11 DIAGNOSIS — E119 Type 2 diabetes mellitus without complications: Secondary | ICD-10-CM | POA: Diagnosis not present

## 2021-02-11 DIAGNOSIS — Z7982 Long term (current) use of aspirin: Secondary | ICD-10-CM | POA: Insufficient documentation

## 2021-02-11 DIAGNOSIS — E039 Hypothyroidism, unspecified: Secondary | ICD-10-CM | POA: Insufficient documentation

## 2021-02-11 DIAGNOSIS — I10 Essential (primary) hypertension: Secondary | ICD-10-CM | POA: Diagnosis not present

## 2021-02-11 DIAGNOSIS — Z794 Long term (current) use of insulin: Secondary | ICD-10-CM | POA: Diagnosis not present

## 2021-02-11 DIAGNOSIS — R52 Pain, unspecified: Secondary | ICD-10-CM

## 2021-02-11 DIAGNOSIS — M1711 Unilateral primary osteoarthritis, right knee: Secondary | ICD-10-CM

## 2021-02-11 DIAGNOSIS — Z79899 Other long term (current) drug therapy: Secondary | ICD-10-CM | POA: Insufficient documentation

## 2021-02-11 DIAGNOSIS — Z96651 Presence of right artificial knee joint: Secondary | ICD-10-CM

## 2021-02-11 HISTORY — PX: TOTAL KNEE ARTHROPLASTY: SHX125

## 2021-02-11 LAB — GLUCOSE, CAPILLARY
Glucose-Capillary: 112 mg/dL — ABNORMAL HIGH (ref 70–99)
Glucose-Capillary: 84 mg/dL (ref 70–99)
Glucose-Capillary: 154 mg/dL — ABNORMAL HIGH (ref 70–99)
Glucose-Capillary: 307 mg/dL — ABNORMAL HIGH (ref 70–99)

## 2021-02-11 LAB — HEMOGLOBIN A1C
Hgb A1c MFr Bld: 5.9 % — ABNORMAL HIGH (ref 4.8–5.6)
Mean Plasma Glucose: 122.63 mg/dL

## 2021-02-11 SURGERY — ARTHROPLASTY, KNEE, TOTAL
Anesthesia: Monitor Anesthesia Care | Site: Knee | Laterality: Right

## 2021-02-11 MED ORDER — LACTATED RINGERS IV SOLN: INTRAVENOUS | Status: DC

## 2021-02-11 MED ORDER — HYDROMORPHONE HCL 1 MG/ML IJ SOLN: 0.5000 mg | INTRAMUSCULAR | Status: DC | PRN

## 2021-02-11 MED ORDER — TRANEXAMIC ACID 1000 MG/10ML IV SOLN
INTRAVENOUS | Status: DC | PRN
  Administered 2021-02-11: 2000 mg via TOPICAL

## 2021-02-11 MED ORDER — MIDAZOLAM HCL 2 MG/2ML IJ SOLN
INTRAMUSCULAR | Status: AC
  Filled 2021-02-11: qty 2

## 2021-02-11 MED ORDER — CEFAZOLIN SODIUM-DEXTROSE 2-4 GM/100ML-% IV SOLN
2.0000 g | Freq: Four times a day (QID) | INTRAVENOUS | Status: AC
  Administered 2021-02-11 (×2): 2 g via INTRAVENOUS
  Filled 2021-02-11 (×2): qty 100

## 2021-02-11 MED ORDER — BUPIVACAINE-MELOXICAM ER 400-12 MG/14ML IJ SOLN
INTRAMUSCULAR | Status: DC | PRN
  Administered 2021-02-11: 400 mg

## 2021-02-11 MED ORDER — ACETAMINOPHEN 500 MG PO TABS: 1000.0000 mg | ORAL_TABLET | Freq: Once | ORAL | Status: DC | PRN

## 2021-02-11 MED ORDER — INSULIN ASPART 100 UNIT/ML IJ SOLN
0.0000 [IU] | Freq: Every day | INTRAMUSCULAR | Status: DC
  Administered 2021-02-11: 4 [IU] via SUBCUTANEOUS

## 2021-02-11 MED ORDER — SODIUM CHLORIDE 0.9 % IV SOLN: INTRAVENOUS | Status: DC

## 2021-02-11 MED ORDER — CEFAZOLIN SODIUM-DEXTROSE 2-4 GM/100ML-% IV SOLN
INTRAVENOUS | Status: AC
  Filled 2021-02-11: qty 100

## 2021-02-11 MED ORDER — ORAL CARE MOUTH RINSE: 15.0000 mL | Freq: Once | OROMUCOSAL | Status: AC

## 2021-02-11 MED ORDER — ONDANSETRON HCL 4 MG/2ML IJ SOLN
INTRAMUSCULAR | Status: DC | PRN
  Administered 2021-02-11: 4 mg via INTRAVENOUS

## 2021-02-11 MED ORDER — ROPIVACAINE HCL 7.5 MG/ML IJ SOLN
INTRAMUSCULAR | Status: DC | PRN
  Administered 2021-02-11: 20 mL via PERINEURAL

## 2021-02-11 MED ORDER — MENTHOL 3 MG MT LOZG: 1.0000 | LOZENGE | OROMUCOSAL | Status: DC | PRN

## 2021-02-11 MED ORDER — ACETAMINOPHEN 10 MG/ML IV SOLN: 1000.0000 mg | Freq: Once | INTRAVENOUS | Status: DC | PRN

## 2021-02-11 MED ORDER — OXYCODONE HCL 5 MG/5ML PO SOLN: 5.0000 mg | Freq: Once | ORAL | Status: DC | PRN

## 2021-02-11 MED ORDER — PHENOL 1.4 % MT LIQD: 1.0000 | OROMUCOSAL | Status: DC | PRN

## 2021-02-11 MED ORDER — TRANEXAMIC ACID-NACL 1000-0.7 MG/100ML-% IV SOLN
1000.0000 mg | INTRAVENOUS | Status: AC
  Administered 2021-02-11: 1000 mg via INTRAVENOUS

## 2021-02-11 MED ORDER — METOPROLOL SUCCINATE ER 25 MG PO TB24
25.0000 mg | ORAL_TABLET | Freq: Every day | ORAL | Status: DC
  Administered 2021-02-12: 25 mg via ORAL
  Filled 2021-02-11: qty 1

## 2021-02-11 MED ORDER — CHLORHEXIDINE GLUCONATE 0.12 % MT SOLN
OROMUCOSAL | Status: AC
  Administered 2021-02-11: 15 mL via OROMUCOSAL
  Filled 2021-02-11: qty 15

## 2021-02-11 MED ORDER — METOCLOPRAMIDE HCL 5 MG/ML IJ SOLN: 5.0000 mg | Freq: Three times a day (TID) | INTRAMUSCULAR | Status: DC | PRN

## 2021-02-11 MED ORDER — FENTANYL CITRATE (PF) 250 MCG/5ML IJ SOLN
INTRAMUSCULAR | Status: AC
  Filled 2021-02-11: qty 5

## 2021-02-11 MED ORDER — ACETAMINOPHEN 500 MG PO TABS
1000.0000 mg | ORAL_TABLET | Freq: Four times a day (QID) | ORAL | Status: AC
  Administered 2021-02-11 – 2021-02-12 (×4): 1000 mg via ORAL
  Filled 2021-02-11 (×4): qty 2

## 2021-02-11 MED ORDER — CEFAZOLIN SODIUM-DEXTROSE 2-4 GM/100ML-% IV SOLN
2.0000 g | INTRAVENOUS | Status: AC
Start: 2021-02-11 — End: 2021-02-11
  Administered 2021-02-11: 2 g via INTRAVENOUS

## 2021-02-11 MED ORDER — MIDAZOLAM HCL 5 MG/5ML IJ SOLN
INTRAMUSCULAR | Status: DC | PRN
  Administered 2021-02-11: 2 mg via INTRAVENOUS

## 2021-02-11 MED ORDER — DEXAMETHASONE SODIUM PHOSPHATE 10 MG/ML IJ SOLN
10.0000 mg | Freq: Once | INTRAMUSCULAR | Status: AC
  Administered 2021-02-12: 10 mg via INTRAVENOUS
  Filled 2021-02-11: qty 1

## 2021-02-11 MED ORDER — 0.9 % SODIUM CHLORIDE (POUR BTL) OPTIME
TOPICAL | Status: DC | PRN
  Administered 2021-02-11: 1000 mL

## 2021-02-11 MED ORDER — TRANEXAMIC ACID 1000 MG/10ML IV SOLN
2000.0000 mg | INTRAVENOUS | Status: DC
  Filled 2021-02-11: qty 20

## 2021-02-11 MED ORDER — PROPOFOL 1000 MG/100ML IV EMUL
INTRAVENOUS | Status: AC
  Filled 2021-02-11: qty 200

## 2021-02-11 MED ORDER — GABAPENTIN 300 MG PO CAPS
300.0000 mg | ORAL_CAPSULE | Freq: Every day | ORAL | Status: DC
  Administered 2021-02-11: 300 mg via ORAL
  Filled 2021-02-11: qty 1

## 2021-02-11 MED ORDER — METOCLOPRAMIDE HCL 5 MG PO TABS: 5.0000 mg | ORAL_TABLET | Freq: Three times a day (TID) | ORAL | Status: DC | PRN

## 2021-02-11 MED ORDER — OXYCODONE HCL 5 MG PO TABS: 10.0000 mg | ORAL_TABLET | ORAL | Status: DC | PRN

## 2021-02-11 MED ORDER — ONDANSETRON HCL 4 MG/2ML IJ SOLN: 4.0000 mg | Freq: Four times a day (QID) | INTRAMUSCULAR | Status: DC | PRN

## 2021-02-11 MED ORDER — POVIDONE-IODINE 10 % EX SWAB
2.0000 "application " | Freq: Once | CUTANEOUS | Status: AC
  Administered 2021-02-11: 2 via TOPICAL

## 2021-02-11 MED ORDER — ACETAMINOPHEN 325 MG PO TABS: 325.0000 mg | ORAL_TABLET | Freq: Four times a day (QID) | ORAL | Status: DC | PRN

## 2021-02-11 MED ORDER — BUPIVACAINE IN DEXTROSE 0.75-8.25 % IT SOLN
INTRATHECAL | Status: DC | PRN
  Administered 2021-02-11: 1.6 mL via INTRATHECAL

## 2021-02-11 MED ORDER — BUPIVACAINE-MELOXICAM ER 200-6 MG/7ML IJ SOLN
INTRAMUSCULAR | Status: AC
  Filled 2021-02-11: qty 1

## 2021-02-11 MED ORDER — LIDOCAINE 2% (20 MG/ML) 5 ML SYRINGE
INTRAMUSCULAR | Status: AC
  Filled 2021-02-11: qty 5

## 2021-02-11 MED ORDER — LACTATED RINGERS IV SOLN: INTRAVENOUS | Status: DC | PRN

## 2021-02-11 MED ORDER — TRANEXAMIC ACID-NACL 1000-0.7 MG/100ML-% IV SOLN
1000.0000 mg | Freq: Once | INTRAVENOUS | Status: AC
  Administered 2021-02-11: 1000 mg via INTRAVENOUS
  Filled 2021-02-11: qty 100

## 2021-02-11 MED ORDER — TRANEXAMIC ACID-NACL 1000-0.7 MG/100ML-% IV SOLN
INTRAVENOUS | Status: AC
  Filled 2021-02-11: qty 100

## 2021-02-11 MED ORDER — PROPOFOL 500 MG/50ML IV EMUL
INTRAVENOUS | Status: DC | PRN
  Administered 2021-02-11: 150 ug/kg/min via INTRAVENOUS

## 2021-02-11 MED ORDER — VANCOMYCIN HCL 1000 MG IV SOLR
INTRAVENOUS | Status: DC | PRN
  Administered 2021-02-11: 1000 mg

## 2021-02-11 MED ORDER — FENTANYL CITRATE (PF) 250 MCG/5ML IJ SOLN
INTRAMUSCULAR | Status: DC | PRN
  Administered 2021-02-11: 100 ug via INTRAVENOUS

## 2021-02-11 MED ORDER — ACETAMINOPHEN 160 MG/5ML PO SOLN: 1000.0000 mg | Freq: Once | ORAL | Status: DC | PRN

## 2021-02-11 MED ORDER — OXYCODONE HCL 5 MG PO TABS: 5.0000 mg | ORAL_TABLET | Freq: Once | ORAL | Status: DC | PRN

## 2021-02-11 MED ORDER — CHLORHEXIDINE GLUCONATE 0.12 % MT SOLN
15.0000 mL | Freq: Once | OROMUCOSAL | Status: AC
Start: 2021-02-11 — End: 2021-02-11

## 2021-02-11 MED ORDER — ASPIRIN 81 MG PO CHEW
81.0000 mg | CHEWABLE_TABLET | Freq: Two times a day (BID) | ORAL | Status: DC
  Administered 2021-02-11 – 2021-02-12 (×2): 81 mg via ORAL
  Filled 2021-02-11 (×2): qty 1

## 2021-02-11 MED ORDER — OXYCODONE HCL 5 MG PO TABS
5.0000 mg | ORAL_TABLET | ORAL | Status: DC | PRN
  Administered 2021-02-11 – 2021-02-12 (×5): 10 mg via ORAL
  Filled 2021-02-11 (×5): qty 2

## 2021-02-11 MED ORDER — METHOCARBAMOL 500 MG PO TABS
500.0000 mg | ORAL_TABLET | Freq: Four times a day (QID) | ORAL | Status: DC | PRN
  Administered 2021-02-11 – 2021-02-12 (×3): 500 mg via ORAL
  Filled 2021-02-11 (×3): qty 1

## 2021-02-11 MED ORDER — DOCUSATE SODIUM 100 MG PO CAPS
100.0000 mg | ORAL_CAPSULE | Freq: Two times a day (BID) | ORAL | Status: DC
  Administered 2021-02-11 – 2021-02-12 (×2): 100 mg via ORAL
  Filled 2021-02-11 (×2): qty 1

## 2021-02-11 MED ORDER — EPHEDRINE SULFATE 50 MG/ML IJ SOLN
INTRAMUSCULAR | Status: DC | PRN
  Administered 2021-02-11: 5 mg via INTRAVENOUS
  Administered 2021-02-11 (×2): 10 mg via INTRAVENOUS
  Administered 2021-02-11: 5 mg via INTRAVENOUS

## 2021-02-11 MED ORDER — SODIUM CHLORIDE 0.9 % IR SOLN
Status: DC | PRN
  Administered 2021-02-11: 1000 mL

## 2021-02-11 MED ORDER — PROPOFOL 10 MG/ML IV BOLUS
INTRAVENOUS | Status: DC | PRN
  Administered 2021-02-11: 30 mg via INTRAVENOUS

## 2021-02-11 MED ORDER — INSULIN ASPART 100 UNIT/ML IJ SOLN
0.0000 [IU] | Freq: Three times a day (TID) | INTRAMUSCULAR | Status: DC
  Administered 2021-02-11: 3 [IU] via SUBCUTANEOUS
  Administered 2021-02-12: 2 [IU] via SUBCUTANEOUS

## 2021-02-11 MED ORDER — METHOCARBAMOL 1000 MG/10ML IJ SOLN
500.0000 mg | Freq: Four times a day (QID) | INTRAVENOUS | Status: DC | PRN
  Filled 2021-02-11: qty 5

## 2021-02-11 MED ORDER — ONDANSETRON HCL 4 MG PO TABS
4.0000 mg | ORAL_TABLET | Freq: Four times a day (QID) | ORAL | Status: DC | PRN
  Administered 2021-02-11: 4 mg via ORAL
  Filled 2021-02-11: qty 1

## 2021-02-11 MED ORDER — IRRISEPT - 450ML BOTTLE WITH 0.05% CHG IN STERILE WATER, USP 99.95% OPTIME
TOPICAL | Status: DC | PRN
  Administered 2021-02-11: 450 mL via TOPICAL

## 2021-02-11 MED ORDER — PHENYLEPHRINE HCL (PRESSORS) 10 MG/ML IV SOLN
INTRAVENOUS | Status: DC | PRN
  Administered 2021-02-11 (×4): 80 ug via INTRAVENOUS

## 2021-02-11 MED ORDER — FENTANYL CITRATE (PF) 100 MCG/2ML IJ SOLN: 25.0000 ug | INTRAMUSCULAR | Status: DC | PRN

## 2021-02-11 MED ORDER — VANCOMYCIN HCL 1000 MG IV SOLR
INTRAVENOUS | Status: AC
  Filled 2021-02-11: qty 1000

## 2021-02-11 SURGICAL SUPPLY — 84 items
ADH SKN CLS APL DERMABOND .7 (GAUZE/BANDAGES/DRESSINGS) ×1
ALCOHOL 70% 16 OZ (MISCELLANEOUS) ×2 IMPLANT
ATTUNE MED DOME PAT 32 KNEE (Knees) ×1 IMPLANT
BAG COUNTER SPONGE SURGICOUNT (BAG) IMPLANT
BAG DECANTER FOR FLEXI CONT (MISCELLANEOUS) ×2 IMPLANT
BAG SPNG CNTER NS LX DISP (BAG)
BANDAGE ESMARK 6X9 LF (GAUZE/BANDAGES/DRESSINGS) IMPLANT
BLADE SAG 18X100X1.27 (BLADE) ×2 IMPLANT
BLADE SAW SGTL 11.0X1.19X90.0M (BLADE) ×1 IMPLANT
BNDG CMPR 9X6 STRL LF SNTH (GAUZE/BANDAGES/DRESSINGS)
BNDG ESMARK 6X9 LF (GAUZE/BANDAGES/DRESSINGS)
BOWL SMART MIX CTS (DISPOSABLE) ×2 IMPLANT
BSPLAT TIB 4 FXBRNG KN ATTUNE (Insert) ×1 IMPLANT
CEMENT BONE REFOBACIN R1X40 US (Cement) ×2 IMPLANT
CLSR STERI-STRIP ANTIMIC 1/2X4 (GAUZE/BANDAGES/DRESSINGS) ×4 IMPLANT
COMP FEM PS CMTLS ATTUNE 3 RT (Joint) ×2 IMPLANT
COMPONENT FEM PS CMLS ATTN 3RT (Joint) IMPLANT
COOLER ICEMAN CLASSIC (MISCELLANEOUS) ×2 IMPLANT
COVER SURGICAL LIGHT HANDLE (MISCELLANEOUS) ×2 IMPLANT
CUFF TOURN SGL QUICK 34 (TOURNIQUET CUFF) ×2
CUFF TOURN SGL QUICK 42 (TOURNIQUET CUFF) IMPLANT
CUFF TRNQT CYL 34X4.125X (TOURNIQUET CUFF) ×1 IMPLANT
DERMABOND ADVANCED (GAUZE/BANDAGES/DRESSINGS) ×1
DERMABOND ADVANCED .7 DNX12 (GAUZE/BANDAGES/DRESSINGS) ×1 IMPLANT
DRAPE EXTREMITY T 121X128X90 (DISPOSABLE) ×2 IMPLANT
DRAPE HALF SHEET 40X57 (DRAPES) ×2 IMPLANT
DRAPE INCISE IOBAN 66X45 STRL (DRAPES) IMPLANT
DRAPE ORTHO SPLIT 77X108 STRL (DRAPES) ×4
DRAPE POUCH INSTRU U-SHP 10X18 (DRAPES) ×2 IMPLANT
DRAPE SURG ORHT 6 SPLT 77X108 (DRAPES) ×2 IMPLANT
DRAPE U-SHAPE 47X51 STRL (DRAPES) ×4 IMPLANT
DRESSING AQUACEL AG SP 3.5X10 (GAUZE/BANDAGES/DRESSINGS) IMPLANT
DRSG AQUACEL AG ADV 3.5X10 (GAUZE/BANDAGES/DRESSINGS) ×2 IMPLANT
DRSG AQUACEL AG SP 3.5X10 (GAUZE/BANDAGES/DRESSINGS) ×2
DURAPREP 26ML APPLICATOR (WOUND CARE) ×6 IMPLANT
ELECT CAUTERY BLADE 6.4 (BLADE) ×2 IMPLANT
ELECT REM PT RETURN 9FT ADLT (ELECTROSURGICAL) ×2
ELECTRODE REM PT RTRN 9FT ADLT (ELECTROSURGICAL) ×1 IMPLANT
GLOVE SURG LTX SZ7 (GLOVE) ×6 IMPLANT
GLOVE SURG NEOP MICRO LF SZ7.5 (GLOVE) ×6 IMPLANT
GLOVE SURG SYN 7.5  E (GLOVE) ×8
GLOVE SURG SYN 7.5 E (GLOVE) ×4 IMPLANT
GLOVE SURG SYN 7.5 PF PI (GLOVE) ×4 IMPLANT
GLOVE SURG UNDER POLY LF SZ7 (GLOVE) ×10 IMPLANT
GOWN STRL REIN XL XLG (GOWN DISPOSABLE) ×2 IMPLANT
GOWN STRL REUS W/ TWL LRG LVL3 (GOWN DISPOSABLE) ×1 IMPLANT
GOWN STRL REUS W/TWL LRG LVL3 (GOWN DISPOSABLE) ×2
HANDPIECE INTERPULSE COAX TIP (DISPOSABLE) ×2
HOOD PEEL AWAY FLYTE STAYCOOL (MISCELLANEOUS) ×4 IMPLANT
INSERT TIB ATTUNE FB SZ3X8 (Insert) ×1 IMPLANT
INSERT TIB CMT ATTUNE 4 (Insert) ×1 IMPLANT
JET LAVAGE IRRISEPT WOUND (IRRIGATION / IRRIGATOR) ×2
KIT BASIN OR (CUSTOM PROCEDURE TRAY) ×2 IMPLANT
KIT TURNOVER KIT B (KITS) ×2 IMPLANT
LAVAGE JET IRRISEPT WOUND (IRRIGATION / IRRIGATOR) ×1 IMPLANT
MANIFOLD NEPTUNE II (INSTRUMENTS) ×2 IMPLANT
MARKER SKIN DUAL TIP RULER LAB (MISCELLANEOUS) ×2 IMPLANT
NDL SPNL 18GX3.5 QUINCKE PK (NEEDLE) ×2 IMPLANT
NEEDLE SPNL 18GX3.5 QUINCKE PK (NEEDLE) ×4 IMPLANT
NS IRRIG 1000ML POUR BTL (IV SOLUTION) ×2 IMPLANT
PACK TOTAL JOINT (CUSTOM PROCEDURE TRAY) ×2 IMPLANT
PAD ARMBOARD 7.5X6 YLW CONV (MISCELLANEOUS) ×4 IMPLANT
PAD COLD SHLDR WRAP-ON (PAD) ×2 IMPLANT
PIN DRILL FIX HALF THREAD (BIT) ×1 IMPLANT
PIN STEINMAN FIXATION KNEE (PIN) ×1 IMPLANT
SAW OSC TIP CART 19.5X105X1.3 (SAW) ×2 IMPLANT
SET HNDPC FAN SPRY TIP SCT (DISPOSABLE) ×1 IMPLANT
STAPLER VISISTAT 35W (STAPLE) IMPLANT
SUCTION FRAZIER HANDLE 10FR (MISCELLANEOUS) ×2
SUCTION TUBE FRAZIER 10FR DISP (MISCELLANEOUS) ×1 IMPLANT
SUT ETHILON 2 0 FS 18 (SUTURE) ×3 IMPLANT
SUT MNCRL AB 4-0 PS2 18 (SUTURE) IMPLANT
SUT VIC AB 0 CT1 27 (SUTURE) ×4
SUT VIC AB 0 CT1 27XBRD ANBCTR (SUTURE) ×2 IMPLANT
SUT VIC AB 1 CTX 27 (SUTURE) ×6 IMPLANT
SUT VIC AB 2-0 CT1 27 (SUTURE) ×8
SUT VIC AB 2-0 CT1 TAPERPNT 27 (SUTURE) ×4 IMPLANT
SYR 50ML LL SCALE MARK (SYRINGE) ×4 IMPLANT
TOWEL GREEN STERILE (TOWEL DISPOSABLE) ×2 IMPLANT
TOWEL GREEN STERILE FF (TOWEL DISPOSABLE) ×2 IMPLANT
TRAY CATH 16FR W/PLASTIC CATH (SET/KITS/TRAYS/PACK) IMPLANT
UNDERPAD 30X36 HEAVY ABSORB (UNDERPADS AND DIAPERS) ×2 IMPLANT
WRAP KNEE MAXI GEL POST OP (GAUZE/BANDAGES/DRESSINGS) ×2 IMPLANT
YANKAUER SUCT BULB TIP NO VENT (SUCTIONS) ×2 IMPLANT

## 2021-02-11 NOTE — Evaluation (Signed)
Physical Therapy Evaluation Patient Details Name: Linda Black MRN: [id number] DOB: [birth year] Today's Date: 02/11/2021   History of Present Illness  Pt adm 7/11 for rt TKR. PMH - DM, HTN, OA, obesity.  Clinical Impression  Pt presents to PT with decr mobility due to expected pain and weakness after TKR. Pt also became dizzy/lightheaded at end of amb and had to return to supine. BP 104/67 after several minutes. Expect pt will make good progress and she plans to return home with daughter. Explained in detail to pt and daughter importance of not putting anything under rt knee at rest. Explained importance of working on full knee extension.     Follow Up Recommendations Follow surgeon's recommendation for DC plan and follow-up therapies    Equipment Recommendations  Rolling walker with 5" wheels    Recommendations for Other Services       Precautions / Restrictions Precautions Precautions: Knee Restrictions Weight Bearing Restrictions: Yes RLE Weight Bearing: Weight bearing as tolerated      Mobility  Bed Mobility Overal bed mobility: Needs Assistance Bed Mobility: Supine to Sit;Sit to Supine     Supine to sit: Min assist Sit to supine: Min assist   General bed mobility comments: Assist for RLE and to elevate trunk into sitting    Transfers Overall transfer level: Needs assistance Equipment used: Rolling walker (2 wheeled) Transfers: Sit to/from Stand Sit to Stand: Min assist         General transfer comment: Assist to bring hips up. Verbal/tactile cues for hand placement  Ambulation/Gait Ambulation/Gait assistance: Min guard Gait Distance (Feet): 100 Feet Assistive device: Rolling walker (2 wheeled) Gait Pattern/deviations: Step-through pattern;Decreased step length - left;Decreased stance time - right;Antalgic Gait velocity: decr Gait velocity interpretation: <1.31 ft/sec, indicative of household ambulator General Gait Details: Assist for safety.  Stairs             Wheelchair Mobility    Modified Rankin (Stroke Patients Only)       Balance Overall balance assessment: No apparent balance deficits (not formally assessed)                                           Pertinent Vitals/Pain Pain Assessment: Faces Faces Pain Scale: Hurts even more Pain Location: rt knee Pain Descriptors / Indicators: Grimacing;Guarding Pain Intervention(s): Limited activity within patient's tolerance;Monitored during session;Premedicated before session;Repositioned    Home Living Family/patient expects to be discharged to:: Private residence Living Arrangements: Children Available Help at Discharge: Family;Available 24 hours/day Type of Home: House Home Access: Stairs to enter   [employer] of Steps: 1 Home Layout: One level Home Equipment: None      Prior Function Level of Independence: Independent               Hand Dominance        Extremity/Trunk Assessment   Upper Extremity Assessment Upper Extremity Assessment: Overall WFL for tasks assessed    Lower Extremity Assessment Lower Extremity Assessment: RLE deficits/detail RLE Deficits / Details: fair quad set, AAROM knee 10-78       Communication   Communication: Prefers language other than [place];Interpreter utilized (daughter interpreted)  Cognition Arousal/Alertness: Awake/alert Behavior During Therapy: WFL for tasks assessed/performed Overall Cognitive Status: Within Functional Limits for tasks assessed  General Comments General comments (skin integrity, edema, etc.): Pt became dizzy at end of amb and had to return to supine. BP 104/67 several minutes after returning to supine    Exercises Total Joint Exercises Quad Sets: AAROM;Right;5 reps;Supine Long Arc Quad: AAROM;Right;5 reps;Seated Knee Flexion: AAROM;Left;5 reps;Seated Goniometric ROM: 10-78 AAROM   Assessment/Plan    PT  Assessment Patient needs continued PT services  PT Problem List Decreased strength;Decreased range of motion;Decreased activity tolerance;Decreased mobility;Pain       PT Treatment Interventions DME instruction;Gait training;Stair training;Functional mobility training;Therapeutic activities;Therapeutic exercise;Patient/family education    PT Goals (Current goals can be found in the Care Plan section)  Acute Rehab PT Goals Patient Stated Goal: go home PT Goal Formulation: With patient/family Time For Goal Achievement: 02/15/21 Potential to Achieve Goals: Good    Frequency 7X/week   Barriers to discharge        Co-evaluation               AM-PAC PT "6 Clicks" Mobility  Outcome Measure Help needed turning from your back to your side while in a flat bed without using bedrails?: A Little Help needed moving from lying on your back to sitting on the side of a flat bed without using bedrails?: A Little Help needed moving to and from a bed to a chair (including a wheelchair)?: A Little Help needed standing up from a chair using your arms (e.g., wheelchair or bedside chair)?: A Little Help needed to walk in hospital room?: A Little Help needed climbing 3-5 steps with a railing? : A Little 6 Click Score: 18    End of Session Equipment Utilized During Treatment: Gait belt Activity Tolerance: Treatment limited secondary to medical complications (Comment) (became dizzy/lightheaded) Patient left: in bed;with call bell/phone within reach;with family/visitor present Nurse Communication: Mobility status;Other (comment) (dizziness) PT Visit Diagnosis: Other abnormalities of gait and mobility (R26.89);Pain Pain - Right/Left: Right Pain - part of body: Knee    Time: 1715-1745 PT Time Calculation (min) (ACUTE ONLY): 30 min   Charges:   PT Evaluation $PT Eval Low Complexity: 1 Low PT Treatments $Gait Training: 8-22 mins        [place] Pager  [phone] Office [place] 02/11/2021, 6:34 PM

## 2021-02-11 NOTE — Op Note (Addendum)
Total Knee Arthroplasty Procedure Note  Preoperative diagnosis: Right knee osteoarthritis  Postoperative diagnosis:same  Operative procedure: Right total knee arthroplasty. CPT [phone]  Surgeon: N. Eduard Roux, MD  Assist: Madalyn Rob, PA-C; necessary for the timely completion of procedure and due to complexity of procedure.  Anesthesia: Spinal, regional  Tourniquet time: less than 60 mins  Implants used: Depuy Attune Femur: PS 3 uncemented Tibia: 4 uncemented Patella: 32 mm, cemented Polyethylene: 8 mm fixed bearing  Indication: Linda Black is a 50 y.o. year old female with a history of knee pain. Having failed conservative management, the patient elected to proceed with a total knee arthroplasty.  We have reviewed the risk and benefits of the surgery and they elected to proceed after voicing understanding.  Procedure:  After informed consent was obtained and understanding of the risk were voiced including but not limited to bleeding, infection, damage to surrounding structures including nerves and vessels, blood clots, leg length inequality and the failure to achieve desired results, the operative extremity was marked with verbal confirmation of the patient in the holding area.   The patient was then brought to the operating room and transported to the operating room table in the supine position.  A tourniquet was applied to the operative extremity around the upper thigh. The operative limb was then prepped and draped in the usual sterile fashion and preoperative antibiotics were administered.  A time out was performed prior to the start of surgery confirming the correct extremity, preoperative antibiotic administration, as well as team members, implants and instruments available for the case. Correct surgical site was also confirmed with preoperative radiographs. The limb was then elevated for exsanguination and the tourniquet was inflated. A midline incision was made and  a standard medial parapatellar approach was performed.  The patella was prepared and sized to a 32 mm.  A cover was placed on the patella for protection from retractors.  We then turned our attention to the femur. Posterior cruciate ligament was sacrificed. Start site was drilled in the femur and the intramedullary distal femoral cutting guide was placed, set at 5 degrees valgus, taking 9 mm of distal resection. The distal cut was made. Osteophytes were then removed.  Extension gap was then checked. Next, the proximal tibial cutting guide was placed with appropriate slope, varus/valgus alignment and depth of resection. The proximal tibial cut was made. Gap blocks were then used to assess the extension gap and alignment, and appropriate soft tissue releases were performed. Attention was turned back to the femur, which was sized using the sizing guide to a size 3. Appropriate rotation of the femoral component was determined using epicondylar axis, Whiteside's line, and assessing the flexion gap under ligament tension. The appropriate size 4-in-1 cutting block was placed and cuts were made.  Posterior femoral osteophytes and uncapped bone were then removed with the curved osteotome.  The box was prepared.  Trial components were placed, and stability was checked in full extension, mid-flexion, and deep flexion. Proper tibial rotation was determined and marked.  The patella tracked well with a limited lateral release. Trial components were then removed and tibial preparation performed.  The tibia was sized for a size 4 component.   The bony surfaces were irrigated with a pulse lavage and then dried. The stability of the construct was re-evaluated throughout a range of motion and found to be acceptable. The trial liner was removed, the knee was copiously irrigated, and the knee was re-evaluated for any excess  bone debris. The real polyethylene liner, 8 mm thick, was inserted and checked to ensure the locking mechanism  had engaged appropriately. The patellar component was cemented into place.  Cement was then allowed to harden.  The tourniquet was deflated and hemostasis was achieved. The wound was irrigated with normal saline.  One gram of vancomycin powder was placed in the surgical bed.  Capsular closure was performed with a #1 vicryl, subcutaneous fat closed with a 0 vicryl suture, then subcutaneous tissue closed with interrupted 2.0 vicryl suture. The skin was then closed with a 2.0 nylon and dermabond. A sterile dressing was applied.  The patient was awakened in the operating room and taken to recovery in stable condition. All sponge, needle, and instrument counts were correct at the end of the case.  Tawanna Cooler was necessary for opening, closing, retracting, limb positioning and overall facilitation and completion of the surgery.  Position: supine  Complications: none.  Time Out: performed   Drains/Packing: none  Estimated blood loss: minimal  Returned to Recovery Room: in good condition.   Antibiotics: yes   Mechanical VTE (DVT) Prophylaxis: sequential compression devices, TED thigh-high  Chemical VTE (DVT) Prophylaxis: aspirin  Fluid Replacement  Crystalloid: see anesthesia record Blood: none  FFP: none   Specimens Removed: 1 to pathology   Sponge and Instrument Count Correct? yes   PACU: portable radiograph - knee AP and Lateral   Plan/RTC: Return in 2 weeks for wound check.   Weight Bearing/Load Lower Extremity: full   Implant Name Type Inv. Item Serial No. Manufacturer Lot No. LRB No. Used Action  CEMENT BONE REFOBACIN [id number] [place] - [id number] Cement CEMENT BONE REFOBACIN [id number] [place]  ZIMMER RECON(ORTH,TRAU,BIO,SG) [id number] Right 1 Implanted  INSERT TIB CMT ATTUNE 4 - [id number] Insert INSERT TIB CMT ATTUNE 4  [employer]PU[employer]TH[employer] [id number] Right 1 Implanted    N. Eduard Roux, MD [facility] 11:59 AM

## 2021-02-11 NOTE — Transfer of Care (Signed)
Immediate Anesthesia Transfer of Care Note  Patient: Linda Black  Procedure(s) Performed: RIGHT TOTAL KNEE ARTHROPLASTY (Right: Knee)  Patient Location: PACU  Anesthesia Type:Spinal  Level of Consciousness: awake  Airway & Oxygen Therapy: Patient Spontanous Breathing  Post-op Assessment: Report given to RN  Post vital signs: stable  Last Vitals:  Vitals Value Taken Time  BP    Temp    Pulse 75 02/11/21 1243  Resp 22 02/11/21 1243  SpO2 91 % 02/11/21 1243  Vitals shown include unvalidated device data.  Last Pain:  Vitals:   02/11/21 0823  TempSrc:   PainSc: 8       Patients Stated Pain Goal: 3 ([id number])  Complications: No notable events documented.

## 2021-02-11 NOTE — Anesthesia Procedure Notes (Addendum)
Spinal  Patient location during procedure: pre-op Start time: 02/11/2021 9:58 AM End time: 02/11/2021 10:04 AM Reason for block: surgical anesthesia Staffing Performed: anesthesiologist  Anesthesiologist: Oleta Mouse, MD Preanesthetic Checklist Completed: patient identified, IV checked, risks and benefits discussed, surgical consent, monitors and equipment checked, pre-op evaluation and timeout performed Spinal Block Patient position: sitting Prep: DuraPrep Patient monitoring: heart rate, cardiac monitor, continuous pulse ox and blood pressure Approach: midline Location: L4-5 Injection technique: single-shot Needle Needle type: Pencan  Needle gauge: 24 G Needle length: 9 cm Assessment Sensory level: T6 Events: CSF return

## 2021-02-11 NOTE — H&P (Signed)
PREOPERATIVE H&P  Chief Complaint: right knee degenerative joint disease  HPI: Linda Black is a 50 y.o. female who presents for surgical treatment of right knee degenerative joint disease.  She denies any changes in medical history.  Past Medical History:  Diagnosis Date   Diabetes mellitus without complication (HCC)    Dr. Kelton Pillar   Fatty liver 2019   elevated LFTs, negative for Hep B and Hep C 04/2018   GERD (gastroesophageal reflux disease)    Hepatitis    Hyperlipidemia    Hypertension    Hypothyroidism 2019   IgA nephropathy 2019   per biopsy, Dr. Corliss Parish   Language barrier    Lumbar stenosis    Dr. Louanne Skye   Obesity    Osteoarthritis    Proteinuria    Past Surgical History:  Procedure Laterality Date   RENAL BIOPSY  2018   IgA nephropathy, Dr. Corliss Parish   Social History   Socioeconomic History   Marital status: Divorced    Spouse name: Not on file   Number of children: 5   Years of education: Not on file   Highest education level: Not on file  Occupational History   Occupation: homemaker  Tobacco Use   Smoking status: Never   Smokeless tobacco: Never  Vaping Use   Vaping Use: Never used  Substance and Sexual Activity   Alcohol use: Never   Drug use: No   Sexual activity: Not on file  Other Topics Concern   Not on file  Social History Narrative   Lives with family, non english speaking, likes to garden.   01/2020   Social Determinants of Health   Financial Resource Strain: Not on file  Food Insecurity: Not on file  Transportation Needs: Not on file  Physical Activity: Not on file  Stress: Not on file  Social Connections: Not on file   Family History  Problem Relation Age of Onset   Diabetes Mother    Hypertension Father    Colon cancer Neg Hx    Stomach cancer Neg Hx    Esophageal cancer Neg Hx    Rectal cancer Neg Hx    Liver cancer Neg Hx    Allergies  Allergen Reactions   Lisinopril Swelling     Angioedema    Covid-19 (Mrna) Vaccine     Got quite sick after both initial doses   Other Swelling    Barn[place]as Lister Fruit   Prior to Admission medications   Medication Sig Start Date End Date Taking? Authorizing Provider  acetaminophen (TYLENOL) 500 MG tablet Take 500 mg by mouth every 6 (six) hours as needed for mild pain.   Yes [provider]  amLODipine (NORVASC) 10 MG tablet Take 1 tablet (10 mg total) by mouth daily. 12/17/20  Yes Tysinger, Camelia Eng, PA-C  cholecalciferol (VITAMIN D3) 25 MCG (1000 UNIT) tablet Take 1 tablet (1,000 Units total) by mouth daily. 09/19/20  Yes Tysinger, Camelia Eng, PA-C  docusate sodium (COLACE) 100 MG capsule Take 1 capsule (100 mg total) by mouth daily as needed. 02/06/21 02/06/22 Yes Aundra Dubin, PA-C  furosemide (LASIX) 40 MG tablet Take 1 tablet (40 mg total) by mouth 2 (two) times daily. 12/17/20  Yes Tysinger, Camelia Eng, PA-C  gabapentin (NEURONTIN) 300 MG capsule TAKE 1 CAPSULE(300 MG) BY MOUTH AT BEDTIME Patient taking differently: Take 300 mg by mouth at bedtime. 12/17/20  Yes Tysinger, Camelia Eng, PA-C  KERENDIA 10 MG TABS Take 10 mg by  mouth daily. 09/03/20  Yes [provider]  levothyroxine (SYNTHROID) 25 MCG tablet TAKE 1 TABLET(25 MCG) BY MOUTH DAILY BEFORE AND BREAKFAST 09/19/20  Yes Tysinger, Camelia Eng, PA-C  losartan (COZAAR) 100 MG tablet Take 1 tablet (100 mg total) by mouth daily. 12/17/20  Yes Tysinger, Camelia Eng, PA-C  methocarbamol (ROBAXIN) 500 MG tablet Take 1 tablet (500 mg total) by mouth 2 (two) times daily as needed. 02/06/21  Yes Aundra Dubin, PA-C  metoprolol succinate (TOPROL-XL) 25 MG 24 hr tablet 1 tablet po daily Patient taking differently: Take 25 mg by mouth daily. 12/17/20  Yes Tysinger, Camelia Eng, PA-C  ondansetron (ZOFRAN) 4 MG tablet Take 1 tablet (4 mg total) by mouth every 8 (eight) hours as needed for nausea or vomiting. 02/06/21  Yes Aundra Dubin, PA-C  rosuvastatin (CRESTOR) 40 MG tablet TAKE 1 TABLET(40 MG) BY MOUTH  DAILY Patient taking differently: Take 40 mg by mouth daily. 09/18/20  Yes Tysinger, Camelia Eng, PA-C  traMADol (ULTRAM) 50 MG tablet 1 tablet by mouth every 12 hours as needed Patient taking differently: Take 50 mg by mouth every 12 (twelve) hours as needed for moderate pain. 01/11/21  Yes Lanae Crumbly, PA-C  ACCU-CHEK AVIVA PLUS test strip TEST TWICE A DAY 04/04/19   Tysinger, Camelia Eng, PA-C  aspirin EC 81 MG tablet Take 1 tablet (81 mg total) by mouth 2 (two) times daily. To be taken after surgery 02/06/21   Aundra Dubin, PA-C  Insulin Pen Needle 32G X 4 MM MISC 1 each by Does not apply route at bedtime. 01/06/18   Tysinger, Camelia Eng, PA-C  oxyCODONE-acetaminophen (PERCOCET) 5-325 MG tablet Take 1-2 tablets by mouth every 6 (six) hours as needed. To be taken after surgery 02/06/21   Aundra Dubin, PA-C  potassium chloride SA (KLOR-CON) 20 MEQ tablet Take 1 tablet (20 mEq total) by mouth daily. Patient not taking: No sig reported 09/18/20   Tysinger, Camelia Eng, PA-C  sulfamethoxazole-trimethoprim (BACTRIM DS) 800-160 MG tablet Take 1 tablet by mouth 2 (two) times daily. To be taken after surgery 02/06/21   Aundra Dubin, PA-C     Positive ROS: All other systems have been reviewed and were otherwise negative with the exception of those mentioned in the HPI and as above.  Physical Exam: General: Alert, no acute distress Cardiovascular: No pedal edema Respiratory: No cyanosis, no use of accessory musculature GI: abdomen soft Skin: No lesions in the area of chief complaint Neurologic: Sensation intact distally Psychiatric: Patient is competent for consent with normal mood and affect Lymphatic: no lymphedema  MUSCULOSKELETAL: exam stable  Assessment: right knee degenerative joint disease  Plan: Plan for Procedure(s): RIGHT TOTAL KNEE ARTHROPLASTY  The risks benefits and alternatives were discussed with the patient including but not limited to the risks of nonoperative treatment, versus  surgical intervention including infection, bleeding, nerve injury,  blood clots, cardiopulmonary complications, morbidity, mortality, among others, and they were willing to proceed.   Preoperative templating of the joint replacement has been completed, documented, and submitted to the Operating Room personnel in order to optimize intra-operative equipment management.   Eduard Roux, MD 02/11/2021 8:58 AM

## 2021-02-11 NOTE — Discharge Instructions (Signed)

## 2021-02-11 NOTE — Anesthesia Procedure Notes (Signed)
  Anesthesia Regional Block: Popliteal block   Pre-Anesthetic Checklist: , timeout performed,  Correct Patient, Correct Site, Correct Laterality,  Correct Procedure, Correct Position, site marked,  Risks and benefits discussed,  Surgical consent,  Pre-op evaluation,  At surgeon's request and post-op pain management  Laterality: Right and Lower  Prep: chloraprep       Needles:  Injection technique: Single-shot      Needle Length: 9cm  Needle Gauge: 22     Additional Needles: Arrow StimuQuik ECHO Echogenic Stimulating PNB Needle  Procedures:,,,, ultrasound used (permanent image in chart),,    Narrative:  Start time: 02/11/2021 10:03 AM End time: 02/11/2021 10:07 AM Injection made incrementally with aspirations every 5 mL.  Performed by: Personally  Anesthesiologist: Oleta Mouse, MD

## 2021-02-12 DIAGNOSIS — M1711 Unilateral primary osteoarthritis, right knee: Secondary | ICD-10-CM | POA: Diagnosis not present

## 2021-02-12 LAB — GLUCOSE, CAPILLARY
Glucose-Capillary: 74 mg/dL (ref 70–99)
Glucose-Capillary: 121 mg/dL — ABNORMAL HIGH (ref 70–99)

## 2021-02-12 LAB — CBC
HCT: 34.9 % — ABNORMAL LOW (ref 36.0–46.0)
Hemoglobin: 11.6 g/dL — ABNORMAL LOW (ref 12.0–15.0)
MCH: 30.2 pg (ref 26.0–34.0)
MCHC: 33.2 g/dL (ref 30.0–36.0)
MCV: 90.9 fL (ref 80.0–100.0)
Platelets: 243 10*3/uL (ref 150–400)
RBC: 3.84 MIL/uL — ABNORMAL LOW (ref 3.87–5.11)
RDW: 11.7 % (ref 11.5–15.5)
WBC: 8.6 10*3/uL (ref 4.0–10.5)
nRBC: 0 % (ref 0.0–0.2)

## 2021-02-12 LAB — BASIC METABOLIC PANEL
Anion gap: 9 (ref 5–15)
BUN: 30 mg/dL — ABNORMAL HIGH (ref 6–20)
CO2: 23 mmol/L (ref 22–32)
Calcium: 8.5 mg/dL — ABNORMAL LOW (ref 8.9–10.3)
Chloride: 104 mmol/L (ref 98–111)
Creatinine, Ser: 2.36 mg/dL — ABNORMAL HIGH (ref 0.44–1.00)
GFR, Estimated: 25 mL/min — ABNORMAL LOW (ref >60)
Glucose, Bld: 111 mg/dL — ABNORMAL HIGH (ref 70–99)
Potassium: 4.5 mmol/L (ref 3.5–5.1)
Sodium: 136 mmol/L (ref 135–145)

## 2021-02-12 NOTE — Progress Notes (Signed)
Physical Therapy Treatment Patient Details Name: Linda Black MRN: [id number] DOB: [birth year] Today's Date: 02/12/2021    History of Present Illness Pt adm 7/11 for rt TKR. PMH - DM, HTN, OA, obesity.    PT Comments    Pt resting in bed on entry with 2 daughters present who assisted with interpretation. Pt continues to have c/o heaviness in her RLE, but pain is improving. Pt is making good progress towards her goals, however continues to be limited in safe mobility by R LE pain in presence of weakness and decreased ROM. Pt is currently min guard for bed mobility, transfers and ambulation. Provided education on proper technique for getting in and out of car. D/c plans remain appropriate.  Pt to discharge after therapy.    Follow Up Recommendations  Follow surgeon's recommendation for DC plan and follow-up therapies     Equipment Recommendations  Rolling walker with 5" wheels       Precautions / Restrictions Precautions Precautions: Knee Precaution Booklet Issued: Yes (comment) Precaution Comments: reviewed HEP with both daughters present for discharge Restrictions Weight Bearing Restrictions: Yes RLE Weight Bearing: Weight bearing as tolerated    Mobility  Bed Mobility Overal bed mobility: Needs Assistance Bed Mobility: Supine to Sit;Sit to Supine     Supine to sit: Min guard     General bed mobility comments: min guard for safet, vc for moving one leg at a time a short distance across the bed instead of moving L LE all the way out of the bed and having to use UE to move RLE    Transfers Overall transfer level: Needs assistance Equipment used: Rolling walker (2 wheeled) Transfers: Sit to/from Stand Sit to Stand: Min guard         General transfer comment: min guard for safety, good hand position, power up and steadying in upright  Ambulation/Gait Ambulation/Gait assistance: Min guard Gait Distance (Feet): 150 Feet Assistive device: Rolling walker (2 wheeled) Gait  Pattern/deviations: Step-through pattern;Decreased stance time - right;Antalgic;Decreased weight shift to right;Decreased step length - left Gait velocity: decr Gait velocity interpretation: <1.31 ft/sec, indicative of household ambulator General Gait Details: min guard for safety improving gait cadence and sequencing       Balance Overall balance assessment: No apparent balance deficits (not formally assessed)                                          Cognition Arousal/Alertness: Awake/alert Behavior During Therapy: WFL for tasks assessed/performed Overall Cognitive Status: Within Functional Limits for tasks assessed                                        Exercises Total Joint Exercises Ankle Circles/Pumps: AROM;Both;10 reps;Supine Quad Sets: AAROM;Right;5 reps;Supine Heel Slides: AROM;Right;10 reps;Supine    General Comments General comments (skin integrity, edema, etc.): Educated pt and her daughters on proper technique for getting into and out of the car      Pertinent Vitals/Pain Pain Assessment: Faces Faces Pain Scale: Hurts little more Pain Location: rt knee Pain Descriptors / Indicators: Grimacing;Guarding Pain Intervention(s): Limited activity within patient's tolerance;Monitored during session;Repositioned;Premedicated before session     PT Goals (current goals can now be found in the care plan section) Acute Rehab PT Goals Patient Stated Goal: go home PT Goal  Formulation: With patient/family Time For Goal Achievement: 02/15/21 Potential to Achieve Goals: Good Progress towards PT goals: Progressing toward goals    Frequency    7X/week      PT Plan Current plan remains appropriate       AM-PAC PT "6 Clicks" Mobility   Outcome Measure  Help needed turning from your back to your side while in a flat bed without using bedrails?: A Little Help needed moving from lying on your back to sitting on the side of a flat bed  without using bedrails?: A Little Help needed moving to and from a bed to a chair (including a wheelchair)?: A Little Help needed standing up from a chair using your arms (e.g., wheelchair or bedside chair)?: A Little Help needed to walk in hospital room?: A Little Help needed climbing 3-5 steps with a railing? : A Little 6 Click Score: 18    End of Session Equipment Utilized During Treatment: Gait belt Activity Tolerance: Treatment limited secondary to medical complications (Comment) (became dizzy/lightheaded) Patient left: in bed;with call bell/phone within reach;with family/visitor present (seated EoB waiting for d/c) Nurse Communication: Mobility status PT Visit Diagnosis: Other abnormalities of gait and mobility (R26.89);Pain Pain - Right/Left: Right Pain - part of body: Knee     Time: 1211-1231 PT Time Calculation (min) (ACUTE ONLY): 20 min  Charges:  $Gait Training: 8-22 mins                     Zaeden Lastinger B. Migdalia Dk PT, DPT Acute Rehabilitation Services Pager [phone] Office [phone]    [place] 02/12/2021, 2:15 PM

## 2021-02-12 NOTE — Progress Notes (Addendum)
Patient alert and oriented, voiding adequately, skin clean, dry and intact without evidence of skin break down, or symptoms of complications - no redness or edema noted, only slight tenderness at site.  Patient states pain is manageable at time of discharge. Patient and daughters stated understanding of discharge instructions given. Patient has an appointment with MD in 2 weeks

## 2021-02-12 NOTE — Progress Notes (Signed)
Subjective: 1 Day Post-Op Procedure(s) (LRB): RIGHT TOTAL KNEE ARTHROPLASTY (Right) Patient reports pain as mild.    Objective: Vital signs in last 24 hours: Temp:  [97.1 F (36.2 C)-98.5 F (36.9 C)] 98 F (36.7 C) (07/12 0721) Pulse Rate:  [60-80] 76 (07/12 0721) Resp:  [14-22] 18 (07/12 0721) BP: (99-155)/(60-84) 134/77 (07/12 0721) SpO2:  [95 %-100 %] 99 % (07/12 0721) Weight:  [78 kg] 78 kg (07/11 0812)  Intake/Output from previous day: 07/11 0701 - 07/12 0700 In: 2400 [I.V.:1700] Out: 100 [Blood:100] Intake/Output this shift: No intake/output data recorded.  No results for input(s): HGB in the last 72 hours. No results for input(s): WBC, RBC, HCT, PLT in the last 72 hours. No results for input(s): NA, K, CL, CO2, BUN, CREATININE, GLUCOSE, CALCIUM in the last 72 hours. No results for input(s): LABPT, INR in the last 72 hours.  Neurologically intact Neurovascular intact Sensation intact distally Intact pulses distally Dorsiflexion/Plantar flexion intact Incision: dressing C/D/I No cellulitis present Compartment soft   Assessment/Plan: 1 Day Post-Op Procedure(s) (LRB): RIGHT TOTAL KNEE ARTHROPLASTY (Right) Advance diet Up with therapy D/C IV fluids D/c home today after second PT session.  Already has oppt set up for Friday WBAT RLE    Anticipated LOS equal to or greater than 2 midnights due to - Age 49 and older with one or more of the following:  - Obesity  - Expected need for hospital services (PT, OT, Nursing) required for safe  discharge  - Anticipated need for postoperative skilled nursing care or inpatient rehab  - Active co-morbidities: Diabetes OR   - Unanticipated findings during/Post Surgery: None  - Patient is a high risk of re-admission due to: None   Aundra Dubin 02/12/2021, 8:03 AM

## 2021-02-12 NOTE — Discharge Summary (Signed)
Patient ID: Linda Black MRN: [id number] DOB/AGE: 04-13-1971 50 y.o.  Admit date: 02/11/2021 Discharge date: 02/12/2021  Admission Diagnoses:  Principal Problem:   Primary osteoarthritis of right knee Active Problems:   Status post total right knee replacement   Discharge Diagnoses:  Same  Past Medical History:  Diagnosis Date   Diabetes mellitus without complication ([place])    Dr. Kelton Pillar   Fatty liver 2019   elevated LFTs, negative for Hep B and Hep C 04/2018   GERD (gastroesophageal reflux disease)    Hepatitis    Hyperlipidemia    Hypertension    Hypothyroidism 2019   IgA nephropathy 2019   per biopsy, Dr. Corliss Parish   Language barrier    Lumbar stenosis    Dr. Louanne Skye   Obesity    Osteoarthritis    Proteinuria     Surgeries: Procedure(s): RIGHT TOTAL KNEE ARTHROPLASTY on 02/11/2021   Consultants:   Discharged Condition: Improved  Hospital Course: Linda Black is an 50 y.o. female who was admitted 02/11/2021 for operative treatment ofPrimary osteoarthritis of right knee. Patient has severe unremitting pain that affects sleep, daily activities, and work/hobbies. After pre-op clearance the patient was taken to the operating room on 02/11/2021 and underwent  Procedure(s): RIGHT TOTAL KNEE ARTHROPLASTY.    Patient was given perioperative antibiotics:  Anti-infectives (From admission, onward)    Start     Dose/Rate Route Frequency Ordered Stop   02/11/21 1600  ceFAZolin (ANCEF) IVPB 2g/100 mL premix        2 g 200 mL/hr over 30 Minutes Intravenous Every 6 hours 02/11/21 1408 02/11/21 2143   02/11/21 1058  vancomycin (VANCOCIN) powder  Status:  Discontinued          As needed 02/11/21 1058 02/11/21 1239   02/11/21 0830  ceFAZolin (ANCEF) IVPB 2g/100 mL premix        2 g 200 mL/hr over 30 Minutes Intravenous On call to O.R. 02/11/21 0815 02/11/21 1014   02/11/21 0818  ceFAZolin (ANCEF) 2-4 GM/100ML-% IVPB       Note to Pharmacy: Wendall Mola   : cabinet  override      02/11/21 0818 02/11/21 1022        Patient was given sequential compression devices, early ambulation, and chemoprophylaxis to prevent DVT.  Patient benefited maximally from hospital stay and there were no complications.    Recent vital signs: Patient Vitals for the past 24 hrs:  BP Temp Temp src Pulse Resp SpO2 Height Weight  02/12/21 0721 134/77 98 F (36.7 C) Oral 76 18 99 % -- --  02/12/21 0450 119/71 98.5 F (36.9 C) Oral 80 20 100 % -- --  02/11/21 2355 137/79 97.7 F (36.5 C) Oral 78 18 100 % -- --  02/11/21 1923 135/80 98.2 F (36.8 C) Oral 74 20 100 % -- --  02/11/21 1626 122/70 97.8 F (36.6 C) -- 63 18 100 % -- --  02/11/21 1416 115/79 97.6 F (36.4 C) Oral 60 20 100 % -- --  02/11/21 1340 114/69 (!) 97.1 F (36.2 C) -- 66 16 99 % -- --  02/11/21 1325 116/71 -- -- 70 17 100 % -- --  02/11/21 1310 99/60 -- -- 78 (!) 22 95 % -- --  02/11/21 1255 104/67 -- -- 70 18 99 % -- --  02/11/21 1240 105/65 97.7 F (36.5 C) -- 73 14 100 % -- --  02/11/21 0812 (!) 155/84 97.9 F (36.6 C) Oral 74 18  97 % '5\' 4"'$  (1.626 m) 78 kg     Recent laboratory studies: No results for input(s): WBC, HGB, HCT, PLT, NA, K, CL, CO2, BUN, CREATININE, GLUCOSE, INR, CALCIUM in the last 72 hours.  Invalid input(s): PT, 2   Discharge Medications:   Allergies as of 02/12/2021       Reactions   Lisinopril Swelling   Angioedema    Covid-19 (mrna) Vaccine    Got quite sick after both initial doses   Other Swelling   Barnabas Lister Fruit        Medication List     STOP taking these medications    acetaminophen 500 MG tablet Commonly known as: TYLENOL   traMADol 50 MG tablet Commonly known as: ULTRAM       TAKE these medications    Accu-Chek Aviva Plus test strip Generic drug: glucose blood TEST TWICE A DAY   amLODipine 10 MG tablet Commonly known as: NORVASC Take 1 tablet (10 mg total) by mouth daily.   aspirin EC 81 MG tablet Take 1 tablet (81 mg total) by mouth 2  (two) times daily. To be taken after surgery   cholecalciferol 25 MCG (1000 UNIT) tablet Commonly known as: VITAMIN D3 Take 1 tablet (1,000 Units total) by mouth daily.   docusate sodium 100 MG capsule Commonly known as: Colace Take 1 capsule (100 mg total) by mouth daily as needed.   furosemide 40 MG tablet Commonly known as: LASIX Take 1 tablet (40 mg total) by mouth 2 (two) times daily.   Insulin Pen Needle 32G X 4 MM Misc 1 each by Does not apply route at bedtime.   Kerendia 10 MG Tabs Generic drug: Finerenone Take 10 mg by mouth daily.   levothyroxine 25 MCG tablet Commonly known as: SYNTHROID TAKE 1 TABLET(25 MCG) BY MOUTH DAILY BEFORE AND BREAKFAST   losartan 100 MG tablet Commonly known as: COZAAR Take 1 tablet (100 mg total) by mouth daily.   methocarbamol 500 MG tablet Commonly known as: Robaxin Take 1 tablet (500 mg total) by mouth 2 (two) times daily as needed.   ondansetron 4 MG tablet Commonly known as: Zofran Take 1 tablet (4 mg total) by mouth every 8 (eight) hours as needed for nausea or vomiting.   oxyCODONE-acetaminophen 5-325 MG tablet Commonly known as: Percocet Take 1-2 tablets by mouth every 6 (six) hours as needed. To be taken after surgery   sulfamethoxazole-trimethoprim 800-160 MG tablet Commonly known as: BACTRIM DS Take 1 tablet by mouth 2 (two) times daily. To be taken after surgery       ASK your doctor about these medications    gabapentin 300 MG capsule Commonly known as: NEURONTIN TAKE 1 CAPSULE(300 MG) BY MOUTH AT BEDTIME   metoprolol succinate 25 MG 24 hr tablet Commonly known as: TOPROL-XL 1 tablet po daily   potassium chloride SA 20 MEQ tablet Commonly known as: KLOR-CON Take 1 tablet (20 mEq total) by mouth daily.   rosuvastatin 40 MG tablet Commonly known as: CRESTOR TAKE 1 TABLET(40 MG) BY MOUTH DAILY               Durable Medical Equipment  (From admission, onward)           Start     Ordered    02/11/21 1409  DME Walker rolling  Once       Question Answer Comment  Walker: With [place]   Patient needs a walker to treat with the following condition  Status post left partial knee replacement      02/11/21 1408   02/11/21 1409  DME 3 n 1  Once        02/11/21 1408   02/11/21 1409  DME Bedside commode  Once       Question:  Patient needs a bedside commode to treat with the following condition  Answer:  Status post left partial knee replacement   02/11/21 1408            Diagnostic Studies: DG Chest 2 View  Result Date: 02/01/2021 CLINICAL DATA:  Preop for right total knee replacement, hypertension and diabetes EXAM: CHEST - 2 VIEW COMPARISON:  11/18/2015 FINDINGS: The heart size and mediastinal contours are within normal limits. Both lungs are clear. The visualized skeletal structures are unremarkable except for degenerative changes of the thoracic spine. No acute osseous finding. Trachea midline. IMPRESSION: No active cardiopulmonary disease. Electronically Signed   By: Jerilynn Mages.  Shick M.D.   On: 02/01/2021 10:00   DG Knee Right Port  Result Date: 02/11/2021 CLINICAL DATA:  Status post right knee arthroplasty EXAM: PORTABLE RIGHT KNEE - 1-2 VIEW COMPARISON:  Dec 11, 2020 FINDINGS: Postsurgical changes of a 3 component total knee arthroplasty without evidence of acute hardware complication. Subcutaneous edema and gas represent postoperative change. IMPRESSION: Postsurgical changes of a 3 component total knee arthroplasty. Electronically Signed   By: Dahlia Bailiff MD   On: 02/11/2021 15:21    Disposition: Discharge disposition: 01-Home or Self Care         Follow-up Information     Leandrew Koyanagi, MD. Schedule an appointment as soon as possible for a visit in 2 week(s).   Specialty: Orthopedic Surgery Contact information: [address] [name] [address] [phone]                  Signed: Aundra Dubin 02/12/2021, 8:05 AM

## 2021-02-12 NOTE — Evaluation (Addendum)
Occupational Therapy Evaluation Patient Details Name: Linda Black MRN: [id number] DOB: [birth year] Today's Date: 02/12/2021    History of Present Illness Pt adm 7/11 for rt TKR. PMH - DM, HTN, OA, obesity.   Clinical Impression   PTA, pt lives with family and reports Independence in all daily tasks without use of AD. Pt presents now with expected postoperative pain (successfully premedicated prior to session). Daughter present and engaged in education for ADL strategies to maximize safety/independence. Pt overall Independent with UB ADLs and light Min A for LB ADLs. Educated on DME for showering tasks and various transfer strategies with handouts provided. Anticipate pt to progress well without OT follow up at DC and daughter will assist as needed. Will continue to follow acutely to further address tub transfers.     Follow Up Recommendations  No OT follow up;Supervision - Intermittent    Equipment Recommendations  3 in 1 bedside commode;Tub/shower bench;Other (comment) (rolling walker)    Recommendations for Other Services       Precautions / Restrictions Precautions Precautions: Knee Restrictions Weight Bearing Restrictions: Yes RLE Weight Bearing: Weight bearing as tolerated      Mobility Bed Mobility Overal bed mobility: Needs Assistance Bed Mobility: Supine to Sit;Sit to Supine     Supine to sit: Min assist Sit to supine: Min assist   General bed mobility comments: Light min A for R LE in/out of bed. daughter assisting pt back into bed    Transfers Overall transfer level: Needs assistance Equipment used: None Transfers: Sit to/from Stand Sit to Stand: Supervision         General transfer comment: supervision for sit to stand at bedside without AD    Balance Overall balance assessment: No apparent balance deficits (not formally assessed)                                         ADL either performed or assessed with clinical judgement   ADL  Overall ADL's : Needs assistance/impaired                     Lower Body Dressing: Minimal assistance;Sit to/from stand Lower Body Dressing Details (indicate cue type and reason): light Min A to don pants around R foot (likely due to getting stuck on grip socks), able to reach very close to foot. Collaborated on strategies and easier clothing to don               General ADL Comments: Educated on tub transfer strategies, use of tub bench vs shower chair with pt reporting unsure she could successfully step over tub. Educated on RW safety in the home, assist/supervision for LB ADLs and tub transfers at home. Encouraged placement of BSC over toilet if difficulty transferring noted     Vision Patient Visual Report: No change from baseline Vision Assessment?: No apparent visual deficits     Perception     Praxis      Pertinent Vitals/Pain Pain Assessment: No/denies pain Pain Location: rt knee Pain Descriptors / Indicators: Numbness Pain Intervention(s): Premedicated before session;Monitored during session     Hand Dominance Right   Extremity/Trunk Assessment Upper Extremity Assessment Upper Extremity Assessment: Overall WFL for tasks assessed   Lower Extremity Assessment Lower Extremity Assessment: Defer to PT evaluation   Cervical / Trunk Assessment Cervical / Trunk Assessment: Normal   Communication Communication Communication: Prefers language  other than [place];Interpreter utilized (daughter interpreted)   Cognition Arousal/Alertness: Awake/alert Behavior During Therapy: WFL for tasks assessed/performed Overall Cognitive Status: Within Functional Limits for tasks assessed                                     General Comments       Exercises     Shoulder Instructions      Home Living Family/patient expects to be discharged to:: Private residence Living Arrangements: Children Available Help at Discharge: Family;Available 24  hours/day Type of Home: House Home Access: Stairs to enter [employer] of Steps: 1   Home Layout: One level     Bathroom Shower/Tub: Tub/shower unit;Walk-in shower   Bathroom Toilet: Standard     Home Equipment: None   Additional Comments: typically uses tub shower but daughter has walk in shower      Prior Functioning/Environment Level of Independence: Independent                 OT Problem List: Decreased activity tolerance;Pain;Decreased knowledge of use of DME or AE      OT Treatment/Interventions: Self-care/ADL training;Therapeutic exercise;DME and/or AE instruction;Therapeutic activities;Patient/family education    OT Goals(Current goals can be found in the care plan section) Acute Rehab OT Goals Patient Stated Goal: recover, manage pain OT Goal Formulation: With patient/family Time For Goal Achievement: 02/26/21 Potential to Achieve Goals: Good  OT Frequency: Min 2X/week   Barriers to D/C:            Co-evaluation              AM-PAC OT "6 Clicks" Daily Activity     Outcome Measure Help from another person eating meals?: None Help from another person taking care of personal grooming?: None Help from another person toileting, which includes using toliet, bedpan, or urinal?: None Help from another person bathing (including washing, rinsing, drying)?: A Little Help from another person to put on and taking off regular upper body clothing?: None Help from another person to put on and taking off regular lower body clothing?: A Little 6 Click Score: 22   End of Session Nurse Communication: Mobility status;Other (comment) (DME)  Activity Tolerance: Patient tolerated treatment well Patient left: in bed;with call bell/phone within reach;with family/visitor present  OT Visit Diagnosis: Other abnormalities of gait and mobility (R26.89);Pain Pain - Right/Left: Right Pain - part of body: Knee                Time: 0651-0708 OT Time  Calculation (min): 17 min Charges:  OT General Charges $OT Visit: 1 Visit OT Evaluation $OT Eval Low Complexity: 1 Low  Malachy Chamber, OTR/L Acute Rehab Services Office: [phone]   Layla Maw 02/12/2021, 7:24 AM

## 2021-02-12 NOTE — Progress Notes (Signed)
Physical Therapy Treatment Patient Details Name: Linda Black MRN: [id number] DOB: 01-Nov-1970 Today's Date: 02/12/2021    History of Present Illness Pt adm 7/11 for rt TKR. PMH - DM, HTN, OA, obesity.    PT Comments    Pt supine in bed with game ready on for pain and edema control. Daughter present and provided assist with translation. Educated pt and daughter on supine and seated ther-ex to be performed as HEP. Pt is currently min A for bed mobility, and min guard for transfers and ambulation with RW. Pt able to progress from step- to step-through gait pattern. D/c plans remain appropriate. PT will follow back for second PT session before d/c.    Follow Up Recommendations  Follow surgeon's recommendation for DC plan and follow-up therapies     Equipment Recommendations  Rolling walker with 5" wheels       Precautions / Restrictions Precautions Precautions: Knee Precaution Booklet Issued: Yes (comment) Precaution Comments: daughter reviewed exercises in handout as pt performed Restrictions Weight Bearing Restrictions: Yes RLE Weight Bearing: Weight bearing as tolerated    Mobility  Bed Mobility Overal bed mobility: Needs Assistance Bed Mobility: Supine to Sit;Sit to Supine     Supine to sit: Min assist Sit to supine: Min guard   General bed mobility comments: min A for management of RLE off bed, vc for limited UE use to move LE    Transfers Overall transfer level: Needs assistance Equipment used: Rolling walker (2 wheeled) Transfers: Sit to/from Stand Sit to Stand: Min guard         General transfer comment: min guard for safety, good hand position, power up and steadying in upright  Ambulation/Gait Ambulation/Gait assistance: Min guard Gait Distance (Feet): 120 Feet Assistive device: Rolling walker (2 wheeled) Gait Pattern/deviations: Step-through pattern;Decreased stance time - right;Antalgic;Decreased weight shift to right;Decreased step length - left Gait  velocity: decr Gait velocity interpretation: <1.31 ft/sec, indicative of household ambulator General Gait Details: min guard for safety, vc for progression from step-to to step-through gait pattern, increased UE support required however is able to progress         Balance Overall balance assessment: No apparent balance deficits (not formally assessed)                                          Cognition Arousal/Alertness: Awake/alert Behavior During Therapy: WFL for tasks assessed/performed Overall Cognitive Status: Within Functional Limits for tasks assessed                                        Exercises Total Joint Exercises Ankle Circles/Pumps: AROM;Both;10 reps;Supine Quad Sets: AAROM;Right;5 reps;Supine Short Arc Quad: AAROM;Right;10 reps;Supine Heel Slides: AROM;Right;10 reps;Supine Hip ABduction/ADduction: Right;AAROM;10 reps Straight Leg Raises: AROM;Right;10 reps;Supine Long Arc Quad: Right;5 reps;Seated;AROM Knee Flexion: AAROM;Left;5 reps;Seated;AROM Goniometric ROM: 8-88 AAROM    General Comments General comments (skin integrity, edema, etc.): Pt with game ready on for pain control and edema mangement,      Pertinent Vitals/Pain Pain Assessment: Faces Faces Pain Scale: Hurts little more Pain Location: rt knee Pain Descriptors / Indicators: Grimacing;Guarding Pain Intervention(s): Limited activity within patient's tolerance;Monitored during session;Repositioned    Home Living Family/patient expects to be discharged to:: Private residence Living Arrangements: Children Available Help at Discharge: Family;Available 24 hours/day Type  of Home: House Home Access: Stairs to enter   Home Layout: One level Home Equipment: None Additional Comments: typically uses tub shower but daughter has walk in shower    Prior Function Level of Independence: Independent          PT Goals (current goals can now be found in the care plan  section) Acute Rehab PT Goals Patient Stated Goal: go home PT Goal Formulation: With patient/family Time For Goal Achievement: 02/15/21 Potential to Achieve Goals: Good Progress towards PT goals: Progressing toward goals    Frequency    7X/week      PT Plan Current plan remains appropriate       AM-PAC PT "6 Clicks" Mobility   Outcome Measure  Help needed turning from your back to your side while in a flat bed without using bedrails?: A Little Help needed moving from lying on your back to sitting on the side of a flat bed without using bedrails?: A Little Help needed moving to and from a bed to a chair (including a wheelchair)?: A Little Help needed standing up from a chair using your arms (e.g., wheelchair or bedside chair)?: A Little Help needed to walk in hospital room?: A Little Help needed climbing 3-5 steps with a railing? : A Little 6 Click Score: 18    End of Session Equipment Utilized During Treatment: Gait belt Activity Tolerance: Treatment limited secondary to medical complications (Comment) (became dizzy/lightheaded) Patient left: in bed;with call bell/phone within reach;with family/visitor present Nurse Communication: Mobility status;Other (comment) (dizziness) PT Visit Diagnosis: Other abnormalities of gait and mobility (R26.89);Pain Pain - Right/Left: Right Pain - part of body: Knee     Time: [id number] PT Time Calculation (min) (ACUTE ONLY): 20 min  Charges:  $Therapeutic Exercise: 8-22 mins                     Alanson Hausmann B. Migdalia Dk PT, DPT Acute Rehabilitation Services Pager [phone] Office [phone]    [place] 02/12/2021, 10:28 AM

## 2021-02-12 NOTE — Anesthesia Postprocedure Evaluation (Signed)
Anesthesia Post Note  Patient: Linda Black  Procedure(s) Performed: RIGHT TOTAL KNEE ARTHROPLASTY (Right: Knee)     Patient location during evaluation: PACU Anesthesia Type: Regional, MAC and Spinal Level of consciousness: awake and alert Pain management: pain level controlled Vital Signs Assessment: post-procedure vital signs reviewed and stable Respiratory status: spontaneous breathing, nonlabored ventilation, respiratory function stable and patient connected to nasal cannula oxygen Cardiovascular status: stable and blood pressure returned to baseline Postop Assessment: no apparent nausea or vomiting Anesthetic complications: no   No notable events documented.  Last Vitals:  Vitals:   02/12/21 0450 02/12/21 0721  BP: 119/71 134/77  Pulse: 80 76  Resp: 20 18  Temp: 36.9 C 36.7 C  SpO2: 100% 99%    Last Pain:  Vitals:   02/12/21 0721  TempSrc: Oral  PainSc:                  Linda Black

## 2021-02-13 ENCOUNTER — Telehealth: Payer: Self-pay

## 2021-02-13 NOTE — Telephone Encounter (Signed)
Transition Care Management Follow-up Telephone Call Date of discharge and from where: 02/12/21 How have you been since you were released from the hospital? No Any questions or concerns? No  Items Reviewed: Did the pt receive and understand the discharge instructions provided? Yes  Medications obtained and verified? Yes  Other? No  Any new allergies since your discharge? No  Dietary orders reviewed? No Do you have support at home? Yes     Functional Questionnaire: (I = Independent and D = Dependent) ADLs: I  Bathing/Dressing- I  Meal Prep- I  Eating- I  Maintaining continence- I  Transferring/Ambulation- I  Managing Meds- I  Follow up appointments reviewed:  PCP Hospital f/u appt confirmed? Yes  Scheduled to see Dr. Frankey Shown  in 2 weeks.  [facility] f/u appt confirmed? Yes   Are transportation arrangements needed? No  If their condition worsens, is the pt aware to call PCP or go to the Emergency Dept.? Yes Was the patient provided with contact information for the PCP's office or ED? Yes Was to pt encouraged to call back with questions or concerns? Yes

## 2021-02-15 ENCOUNTER — Other Ambulatory Visit: Payer: Self-pay

## 2021-02-15 ENCOUNTER — Ambulatory Visit: Payer: Medicaid Other | Attending: Medical

## 2021-02-15 DIAGNOSIS — M62830 Muscle spasm of back: Secondary | ICD-10-CM | POA: Insufficient documentation

## 2021-02-15 DIAGNOSIS — G8929 Other chronic pain: Secondary | ICD-10-CM | POA: Diagnosis present

## 2021-02-15 DIAGNOSIS — M25661 Stiffness of right knee, not elsewhere classified: Secondary | ICD-10-CM | POA: Diagnosis present

## 2021-02-15 DIAGNOSIS — M545 Low back pain, unspecified: Secondary | ICD-10-CM | POA: Diagnosis present

## 2021-02-15 DIAGNOSIS — R262 Difficulty in walking, not elsewhere classified: Secondary | ICD-10-CM

## 2021-02-15 DIAGNOSIS — R531 Weakness: Secondary | ICD-10-CM | POA: Diagnosis present

## 2021-02-15 DIAGNOSIS — M79604 Pain in right leg: Secondary | ICD-10-CM | POA: Diagnosis present

## 2021-02-15 DIAGNOSIS — M25561 Pain in right knee: Secondary | ICD-10-CM | POA: Insufficient documentation

## 2021-02-15 NOTE — Patient Instructions (Signed)
  [id number]

## 2021-02-15 NOTE — Therapy (Signed)
[place], [name], [address] Phone: [phone]   Fax:  [phone]  Physical Therapy Evaluation  Patient Details  Name: Linda Black MRN: [id number] Date of Birth: 04/20/1971 Referring Provider (PT): Carlena Hurl   Encounter Date: 02/15/2021   PT End of Session - 02/15/21 1056     Visit Number 1    Number of Visits 17    Date for PT Re-Evaluation 04/12/21    Authorization Type [employer] MCD, Request additional auth at last authorized visit, 27 annual visits    PT Start Time 1000    PT Stop Time 1045    PT Time Calculation (min) 45 min    Activity Tolerance Treatment limited secondary to medical complications (Comment);Patient limited by pain    Behavior During Therapy [facility] for tasks assessed/performed             Past Medical History:  Diagnosis Date   Diabetes mellitus without complication ([facility])    Dr. Kelton Pillar   Fatty liver 2019   elevated LFTs, negative for Hep B and Hep C 04/2018   GERD (gastroesophageal reflux disease)    Hepatitis    Hyperlipidemia    Hypertension    Hypothyroidism 2019   IgA nephropathy 2019   per biopsy, Dr. Corliss Parish   Language barrier    Lumbar stenosis    Dr. Louanne Skye   Obesity    Osteoarthritis    Proteinuria     Past Surgical History:  Procedure Laterality Date   RENAL BIOPSY  2018   IgA nephropathy, Dr. Corliss Parish    There were no vitals filed for this visit.    Subjective Assessment - 02/15/21 1008     Subjective Pt presents s/p R TKA on 02/11/2021. She is currently WBAT using a FWW at eval. Her primary complaint at this time is R knee pain rated 10/10 deep in the anterior portion of her knee. Prior to her surgery, she has knee pain x2 years. Standing, walking, and sitting are all currently painful for the pt. She reports no N/T at or below the knee, but rather describes a "crawling" sensation inside her knee.    Patient is  accompained by: Family member;Interpreter   Daughter and interpreter   Limitations Sitting;Standing;Walking;House hold activities    How long can you sit comfortably? 2-3 minutes    How long can you stand comfortably? 5 minutes    How long can you walk comfortably? 1 minute    Patient Stated Goals Return to walking, sitting, household chores    Currently in Pain? Yes    Pain Score 10-Worst pain ever    Pain Location Knee    Pain Orientation Right    Pain Descriptors / Indicators Aching;Constant;Discomfort;Sore    Pain Type Surgical pain    Pain Onset In the past 7 days    Pain Frequency Constant    Aggravating Factors  Sitting, standing, walking    Pain Relieving Factors Laying down, pain medication    Effect of Pain on Daily Activities Unable to perform household duties without pain, sitting, standing, walking    Multiple Pain Sites No                [facility] PT Assessment - 02/15/21 0001       Assessment   Medical Diagnosis Unilateral primary osteoarthritis, right knee (M17.11)    Referring Provider (PT) Carlena Hurl    Onset Date/Surgical Date 02/11/21  Hand Dominance Right    Next MD Visit 02/22/2021    Prior Therapy Yes, a year ago for LBP      Precautions   Precautions Knee    Precaution Comments WBAT      Restrictions   Weight Bearing Restrictions Yes    RLE Weight Bearing Weight bearing as tolerated      Balance Screen   Has the patient fallen in the past 6 months No    Has the patient had a decrease in activity level because of a fear of falling?  No    Is the patient reluctant to leave their home because of a fear of falling?  No      Home Environment   Living Environment Private residence    Living Arrangements Children    Available Help at Discharge Family    Type of [place] to enter    Entrance Stairs-Number of Steps 1    Entrance Stairs-Rails None    Home Layout One level    [place] - 2 wheels   passive  motion device being brought to pt's house next week     Prior Function   Level of Independence Independent      Cognition   Overall Cognitive Status Within Functional Limits for tasks assessed      Observation/Other Assessments   Observations Pt has surgical bandage in place over surgical scar, unable to assess scar at this time. Pt reports this will be removed at her next orthopedic appointment.      Observation/Other Assessments-Edema    Edema --   Swelling noted at R global knee     AROM   Right Knee Extension 12   Shy of full extension   Right Knee Flexion 55   AAROM   Left Knee Extension -5    Left Knee Flexion 130      PROM   Left Knee Extension 0-5    Left Knee Flexion 141      Strength   Right Hip Flexion 3+/5   with knee pain   Left Hip Flexion 5/5    Right Knee Flexion 2/5   pain   Right Knee Extension 2/5   pain   Left Knee Flexion 5/5    Left Knee Extension 5/5    Right Ankle Dorsiflexion 5/5    Right Ankle Plantar Flexion 4+/5   knee pain   Left Ankle Dorsiflexion 5/5    Left Ankle Plantar Flexion 5/5      Palpation   Palpation comment Grossly TTP around R knee joint, minor warmth around surgical area      Special Tests   Knee Special tests  other      other    Comments Active SLR   Unable to perform on R     Standardized Balance Assessment   Standardized Balance Assessment Five Times Sit to Stand    Five times sit to stand comments  18sec   With FWW     Functional Gait  Assessment   Gait assessed  Yes   Pt demonstrates antalgic gait pattern with limited R knee flexion                       Objective measurements completed on examination: See above findings.               PT Education - 02/15/21 1055     Education  Details Pt educated about POC progression moving forward with the goal of achieving full knee ROM in the coming weeks. This will be progressed following the removal of her surgical stitches next week. Pt and  daughter also instructed on proper exercises technique when performing HEP.    Person(s) Educated Patient;Child(ren)    Methods Explanation;Demonstration;Handout    Comprehension Verbalized understanding;Returned demonstration              PT Short Term Goals - 02/15/21 1111       PT SHORT TERM GOAL #1   Title Pt will demonstrate understanding and adherence of HEP in order to promote independence in the management of her impairments.    Baseline HEP given at eval.    Time 4    Period Weeks    Status New    Target Date 03/15/21      PT SHORT TERM GOAL #2   Title Pt will demonstrate R knee flexion AROM of 100 degrees of better in order to progress to normal gait pattern.    Baseline R knee flexion AROM = 55 degrees.    Time 4    Period Weeks    Status New    Target Date 03/15/21      PT SHORT TERM GOAL #3   Title Pt will be able to achieve 10 active SLR's in order to demonstrate improved functional quadriceps strength.    Baseline Pt unable to perform an active SLR    Time 4    Period Weeks    Status New    Target Date 03/15/21               PT Long Term Goals - 02/15/21 1115       PT LONG TERM GOAL #1   Title Pt will demonstrate 135 degrees of R knee flexion AROM with 2/10 pain or less in order to return to functional activities such as grocery shopping without limitation.    Baseline 55 degrees with 10/10 pain    Time 8    Period Weeks    Status New    Target Date 04/12/21      PT LONG TERM GOAL #2   Title Pt will achieve 0 degrees of R knee extension AROM with 2/10 pain or less in order to promote normal gait pattern with heel strike.    Baseline 15 degrees shy of full extension with 10/10 pain.    Time 8    Period Weeks    Status New    Target Date 04/12/21      PT LONG TERM GOAL #3   Title Pt will demonstrate R knee extension and flexion MMT of 4+/5 or greater in order to progress LE strengthening regimen without limitation.    Baseline R knee  flexion and extension MMT =2/5    Time 8    Period Weeks    Status New    Target Date 04/12/21      PT LONG TERM GOAL #4   Title Pt will report maximum daily pain of 3/10 or less in order to be more functionally independent without limitation due to pain.    Baseline 10/10 pain throughout the day.    Time 8    Period Weeks    Status New    Target Date 04/12/21                    Plan - 02/15/21 1103     Clinical Impression  Statement Pt is a pleasant 50yo F accompanied by her daughter and an interpreter at eval. Her daughter provided most of the interpretation during the treatment. The pt is 4 days s/p R TKA on 02/11/2021. Upon assessment, the pt demonstrates severe R knee pain, weak R quadriceps musculature, severely limited R knee flexion and extension AROM, decreased balance, antalgic gait, and TTP grossly around surgical knee. She was unable to perform active SLR on the R today due to weakness and pain. Due to the pt's early post-op status and severe pain at eval, low level quadiceps activation and early knee flexion AROM exercises were given. She will benefit from skilled PT to target knee flexion AROM, quadriceps strength, and functional mobility.    Personal Factors and Comorbidities Comorbidity 3+    Comorbidities See medical Hx    Examination-Activity Limitations Bathing;Dressing;Sit;Bend;Squat;Stairs;Locomotion Level;Stand    Examination-Participation Restrictions Driving;Cleaning;Laundry;Yard Work    Stability/Clinical Decision Making Stable/Uncomplicated    Designer, jewellery Low    Rehab Potential Good    PT Frequency 2x / week    PT Duration 8 weeks    PT Treatment/Interventions ADLs/Self Care Home Management;Aquatic Therapy;Biofeedback;Moist Heat;Electrical Stimulation;Cryotherapy;DME Instruction;Therapeutic activities;Stair training;Gait training;Functional mobility training;Therapeutic exercise;Balance training;Neuromuscular re-education;Scar  mobilization;Compression bandaging;Patient/family education;Manual techniques;Passive range of motion;Joint Manipulations    PT Next Visit Plan Introduce R knee flexion AAROM, Tib-fem joint mobilization, weight bearing/ shifts to pt tolerance    PT Home Exercise Plan [id number]    Consulted and Agree with Plan of Care Patient             Patient will benefit from skilled therapeutic intervention in order to improve the following deficits and impairments:  Abnormal gait, Decreased balance, Decreased endurance, Decreased mobility, Difficulty walking, Hypomobility, Increased edema, Decreased range of motion, Decreased activity tolerance, Decreased strength, Pain, Impaired flexibility  Visit Diagnosis: Chronic pain of right knee  General weakness  Difficulty in walking, not elsewhere classified  Stiffness of right knee, not elsewhere classified     Problem List Patient Active Problem List   Diagnosis Date Noted   Primary osteoarthritis of right knee 02/11/2021   Status post total right knee replacement 02/11/2021   Chronic low back pain with sciatica 09/18/2020   Encounter for health maintenance examination in adult 01/09/2020   Radicular pain of right lower extremity 04/18/2019   Other intervertebral disc degeneration, lumbar region 04/18/2019   Radiculopathy of lumbosacral region 03/21/2019   Right leg pain 03/21/2019   Chest pain 09/23/2018   Neuropathy involving both lower extremities 09/23/2018   Screen for colon cancer 05/17/2018   Vaccine counseling 05/17/2018   Fatty liver 05/17/2018   Screening for cervical cancer 05/17/2018   Hypothyroidism 05/17/2018   Need for pneumococcal vaccination 05/17/2018   Generalized abdominal tenderness 05/17/2018   Need for influenza vaccination 04/13/2018   Other fatigue 02/08/2018   Edema 01/25/2018   Renal failure (ARF), acute on chronic (HCC) 01/20/2018   Normocytic normochromic anemia 01/20/2018   Tremor 01/05/2018   Vitamin D  deficiency 01/05/2018   RUQ abdominal pain 10/27/2017   Hemorrhoids 10/27/2017   High risk medication use 09/25/2017   Type 2 diabetes mellitus with neurological complications ([place]) [id number]   Proteinuria 09/02/2017   Paresthesia 09/02/2017   IgA nephropathy 06/10/2017   Language barrier 06/10/2017   Gastroesophageal reflux disease 06/10/2017   SOB (shortness of breath) 02/24/2017   Hyperlipidemia 04/21/2016   Chronic right-sided low back pain with right-sided sciatica 01/29/2016   Morbid obesity ([place]) 01/29/2016   Hypokalemia  01/29/2016   Essential hypertension 01/29/2016     [facility] [place], [name], [address] Phone: [phone]   Fax:  [phone]  Name: Linda Black MRN: [id number] Date of Birth: 04/11/1971  Check all possible CPT codes: [id number]- Therapeutic Exercise, [phone]- Neuro Re-education, [phone] - Gait Training, [phone] - Manual Therapy, [phone] - Therapeutic Activities, [phone] - [place], [phone] - Electrical stimulation (unattended), [phone] - Electrical stimulation (Manual), and [id number] - Physical performance training        Vanessa [place], PT, DPT 02/15/21 11:22 AM

## 2021-02-19 ENCOUNTER — Other Ambulatory Visit: Payer: Self-pay | Admitting: Surgery

## 2021-02-19 ENCOUNTER — Other Ambulatory Visit: Payer: Self-pay | Admitting: Physician Assistant

## 2021-02-19 MED ORDER — TRAMADOL HCL 50 MG PO TABS: 50.0000 mg | ORAL_TABLET | Freq: Three times a day (TID) | ORAL | 0 refills | Status: DC | PRN

## 2021-02-19 NOTE — Telephone Encounter (Signed)
Sent in

## 2021-02-20 ENCOUNTER — Ambulatory Visit: Payer: Medicaid Other

## 2021-02-20 ENCOUNTER — Other Ambulatory Visit: Payer: Self-pay

## 2021-02-20 DIAGNOSIS — M79604 Pain in right leg: Secondary | ICD-10-CM

## 2021-02-20 DIAGNOSIS — M545 Low back pain, unspecified: Secondary | ICD-10-CM

## 2021-02-20 DIAGNOSIS — R262 Difficulty in walking, not elsewhere classified: Secondary | ICD-10-CM

## 2021-02-20 DIAGNOSIS — R531 Weakness: Secondary | ICD-10-CM

## 2021-02-20 DIAGNOSIS — M25661 Stiffness of right knee, not elsewhere classified: Secondary | ICD-10-CM

## 2021-02-20 DIAGNOSIS — M25561 Pain in right knee: Secondary | ICD-10-CM

## 2021-02-20 DIAGNOSIS — M62830 Muscle spasm of back: Secondary | ICD-10-CM

## 2021-02-20 DIAGNOSIS — G8929 Other chronic pain: Secondary | ICD-10-CM

## 2021-02-20 NOTE — Patient Instructions (Signed)
   [id number]

## 2021-02-20 NOTE — Therapy (Signed)
[place], [name], [address] Phone: [phone]   Fax:  [phone]  Physical Therapy Treatment  Patient Details  Name: Linda Black MRN: [id number] Date of Birth: 13-May-1971 Referring Provider (PT): Carlena Hurl   Encounter Date: 02/20/2021   PT End of Session - 02/20/21 1109     Visit Number 2    Number of Visits 17    Date for PT Re-Evaluation 04/12/21    Authorization Type U[employer] MCD, Request additional auth at last authorized visit, 27 annual visits    PT Start Time 1040    PT Stop Time 1125    PT Time Calculation (min) 45 min    Equipment Utilized During Treatment Gait belt    Activity Tolerance Treatment limited secondary to medical complications (Comment);Patient limited by pain;Patient tolerated treatment well    Behavior During Therapy [facility] for tasks assessed/performed             Past Medical History:  Diagnosis Date   Diabetes mellitus without complication ([place])    Dr. Kelton Pillar   Fatty liver 2019   elevated LFTs, negative for Hep B and Hep C 04/2018   GERD (gastroesophageal reflux disease)    Hepatitis    Hyperlipidemia    Hypertension    Hypothyroidism 2019   IgA nephropathy 2019   per biopsy, Dr. Corliss Parish   Language barrier    Lumbar stenosis    Dr. Louanne Skye   Obesity    Osteoarthritis    Proteinuria     Past Surgical History:  Procedure Laterality Date   RENAL BIOPSY  2018   IgA nephropathy, Dr. Corliss Parish   TOTAL KNEE ARTHROPLASTY Right 02/11/2021   Procedure: RIGHT TOTAL KNEE ARTHROPLASTY;  Surgeon: Leandrew Koyanagi, MD;  Location: [place];  Service: Orthopedics;  Laterality: Right;    There were no vitals filed for this visit.   Subjective Assessment - 02/20/21 1016     Subjective Pt reports feeling better today that at her initial eval. She does, however, report subpatellar pain today. She still has her surgical stitches in and is unsure of when  her next orthopedic appointment is to remove the stitches. She states she has done her exercises every day, but she has some anterior pain with knee curls. She also states that sitting down is painful.    Patient is accompained by: Family member;Interpreter   Daughter   Currently in Pain? Yes    Pain Score 8     Pain Location Knee    Pain Orientation Right    Pain Descriptors / Indicators Aching;Constant;Discomfort;Sore    Pain Type Surgical pain    Pain Onset 1 to 4 weeks ago    Pain Frequency Constant                [facility] PT Assessment - 02/20/21 0001       AROM   Right Knee Flexion 65      PROM   Right Knee Flexion 82   AAROM with green strap     Palpation   Patella mobility WNL BIL      other    Comments Active SLR   Moderate quad lag on R                          [facility] Adult PT Treatment/Exercise - 02/20/21 0001       Knee/Hip Exercises: Standing   Forward  Step Up Step Height: 4";15 reps      Knee/Hip Exercises: Supine   Heel Slides 15 reps   AAROM with green strap   Straight Leg Raises --   2x10     Modalities   Modalities Cryotherapy      Cryotherapy   Number Minutes Cryotherapy 10 Minutes    Cryotherapy Location Knee   R   Type of Cryotherapy Ice pack                    PT Education - 02/20/21 1108     Education Details Pt educated about importance of early knee flexion ROM and quadriceps strengthening. Also educated on proper form for newly added exercises.    Person(s) Educated Patient;Child(ren)    Methods Explanation;Demonstration;Verbal cues;Handout    Comprehension Verbal cues required;Returned demonstration;Verbalized understanding              PT Short Term Goals - 02/20/21 1131       PT SHORT TERM GOAL #1   Title Pt will demonstrate understanding and adherence of HEP in order to promote independence in the management of her impairments.    Baseline HEP given at eval.    Time 4    Period Weeks     Status New    Target Date 03/15/21      PT SHORT TERM GOAL #2   Title Pt will demonstrate R knee flexion AROM of 100 degrees of better in order to progress to normal gait pattern.    Baseline R knee flexion AROM = 55 degrees.    Time 4    Period Weeks    Status New    Target Date 03/15/21      PT SHORT TERM GOAL #3   Title Pt will be able to achieve 10 active SLR's in order to demonstrate improved functional quadriceps strength.    Baseline Pt performed 20 SLR on R    Time 4    Period Weeks    Status Achieved    Target Date 03/15/21               PT Long Term Goals - 02/15/21 1115       PT LONG TERM GOAL #1   Title Pt will demonstrate 135 degrees of R knee flexion AROM with 2/10 pain or less in order to return to functional activities such as grocery shopping without limitation.    Baseline 55 degrees with 10/10 pain    Time 8    Period Weeks    Status New    Target Date 04/12/21      PT LONG TERM GOAL #2   Title Pt will achieve 0 degrees of R knee extension AROM with 2/10 pain or less in order to promote normal gait pattern with heel strike.    Baseline 15 degrees shy of full extension with 10/10 pain.    Time 8    Period Weeks    Status New    Target Date 04/12/21      PT LONG TERM GOAL #3   Title Pt will demonstrate R knee extension and flexion MMT of 4+/5 or greater in order to progress LE strengthening regimen without limitation.    Baseline R knee flexion and extension MMT =2/5    Time 8    Period Weeks    Status New    Target Date 04/12/21      PT LONG TERM GOAL #4   Title Pt  will report maximum daily pain of 3/10 or less in order to be more functionally independent without limitation due to pain.    Baseline 10/10 pain throughout the day.    Time 8    Period Weeks    Status New    Target Date 04/12/21                   Plan - 02/20/21 1125     Clinical Impression Statement Pt responded well to all treatment today, demonstrating good  form with all exercises. However, she reported increased pain and fatigue with SLR and knee flexion AAROM. Her pain return to baseline when ending these exercises. She demonstrates improved R knee AROM since eval to 65 degrees with AAROM of 82 degrees using a strap. She also demonstrates improved R quadriceps strength, demonstrating ability to perform several SLR on R with moderate quad lag. She reports a therapeutic response to cryotherapy, with decrease in pain from 8/10 to 6/10. She will continue to benefit from skilled PT to address her primary impairments and return to her prior level of function without limitation. Utilizing [facility] TKR protocol during [place].    Personal Factors and Comorbidities Comorbidity 3+    Comorbidities See medical Hx    Examination-Activity Limitations Bathing;Dressing;Sit;Bend;Squat;Stairs;Locomotion Level;Stand    Examination-Participation Restrictions Driving;Cleaning;Laundry;Yard Work    Stability/Clinical Decision Making Stable/Uncomplicated    Designer, jewellery Low    Rehab Potential Good    PT Frequency 2x / week    PT Duration 8 weeks    PT Treatment/Interventions ADLs/Self Care Home Management;Aquatic Therapy;Biofeedback;Moist Heat;Electrical Stimulation;Cryotherapy;DME Instruction;Therapeutic activities;Stair training;Gait training;Functional mobility training;Therapeutic exercise;Balance training;Neuromuscular re-education;Scar mobilization;Compression bandaging;Patient/family education;Manual techniques;Passive range of motion;Joint Manipulations    PT Next Visit Plan IntroduceTib-fem and patellar joint mobilization, continue to utilize [facility] TKR protocol    PT [place] and Agree with Plan of Care Patient             Patient will benefit from skilled therapeutic intervention in order to improve the following deficits and impairments:  Abnormal gait, Decreased  balance, Decreased endurance, Decreased mobility, Difficulty walking, Hypomobility, Increased edema, Decreased range of motion, Decreased activity tolerance, Decreased strength, Pain, Impaired flexibility  Visit Diagnosis: Chronic pain of right knee  General weakness  Difficulty in walking, not elsewhere classified  Stiffness of right knee, not elsewhere classified  Chronic bilateral low back pain without sciatica  Muscle spasm of back  Acute right-sided low back pain, unspecified whether sciatica present  Pain in right leg     Problem List Patient Active Problem List   Diagnosis Date Noted   Primary osteoarthritis of right knee 02/11/2021   Status post total right knee replacement 02/11/2021   Chronic low back pain with sciatica 09/18/2020   Encounter for health maintenance examination in adult 01/09/2020   Radicular pain of right lower extremity 04/18/2019   Other intervertebral disc degeneration, lumbar region 04/18/2019   Radiculopathy of lumbosacral region 03/21/2019   Right leg pain 03/21/2019   Chest pain 09/23/2018   Neuropathy involving both lower extremities 09/23/2018   Screen for colon cancer 05/17/2018   Vaccine counseling 05/17/2018   Fatty liver 05/17/2018   Screening for cervical cancer 05/17/2018   Hypothyroidism 05/17/2018   Need for pneumococcal vaccination 05/17/2018   Generalized abdominal tenderness 05/17/2018   Need for influenza vaccination 04/13/2018   Other fatigue 02/08/2018   Edema 01/25/2018   Renal failure (ARF), acute  on chronic ([place]) 01/20/2018   Normocytic normochromic anemia 01/20/2018   Tremor 01/05/2018   Vitamin D deficiency 01/05/2018   RUQ abdominal pain 10/27/2017   Hemorrhoids 10/27/2017   High risk medication use 09/25/2017   Type 2 diabetes mellitus with neurological complications ([place]) [id number]   Proteinuria 09/02/2017   Paresthesia 09/02/2017   IgA nephropathy 06/10/2017   Language barrier 06/10/2017    Gastroesophageal reflux disease 06/10/2017   SOB (shortness of breath) 02/24/2017   Hyperlipidemia 04/21/2016   Chronic right-sided low back pain with right-sided sciatica 01/29/2016   Morbid obesity ([place]) 01/29/2016   Hypokalemia 01/29/2016   Essential hypertension 01/29/2016    Vanessa [place], PT, DPT 02/20/21 11:33 AM   [facility] [address] [place], [name], [address] Phone: [phone]   Fax:  [phone]  Name: Linda Black MRN: [id number] Date of Birth: [birth year]

## 2021-02-26 ENCOUNTER — Ambulatory Visit (INDEPENDENT_AMBULATORY_CARE_PROVIDER_SITE_OTHER): Payer: Medicaid Other | Admitting: Orthopaedic Surgery

## 2021-02-26 ENCOUNTER — Encounter: Payer: Self-pay | Admitting: Orthopaedic Surgery

## 2021-02-26 VITALS — Ht 64.0 in | Wt 172.0 lb

## 2021-02-26 DIAGNOSIS — Z96651 Presence of right artificial knee joint: Secondary | ICD-10-CM

## 2021-02-26 MED ORDER — METHOCARBAMOL 500 MG PO TABS: 500.0000 mg | ORAL_TABLET | Freq: Two times a day (BID) | ORAL | 6 refills | Status: DC | PRN

## 2021-02-26 NOTE — Progress Notes (Signed)
Post-Op Visit Note   Patient: Linda Black           Date of Birth: 16-Sep-1970           MRN: [id number] Visit Date: 02/26/2021 PCP: Carlena Hurl, PA-C   Assessment & Plan:  Chief Complaint:  Chief Complaint  Patient presents with   Right Knee - Follow-up    Right total knee arthroplasty 02/11/2021   Visit Diagnoses:  1. Status post total right knee replacement     Plan: Patient is 2-week status post right total knee placement.  Overall doing okay.  She has completed home physical therapy and has outpatient physical therapy scheduled to start tomorrow.  Requesting a refill on Robaxin.  No complaints otherwise.  She is tolerating the CPM well.  Right knee surgical incision is healed.  No signs of infection.  Range of motion 5 to 75 degrees.  Stable to varus valgus.  Range of motion limited by swelling and discomfort.  Sutures removed and Steri-Strips applied today.  She will continue with the CPM and start physical therapy tomorrow.  Continue DVT prophylaxis with aspirin.  Robaxin refilled.  Handicap placard and implant card provided today.  Questions encouraged and answered.  Interpreter present today.  Follow-Up Instructions: Return in about 4 weeks (around 03/26/2021).   Orders:  No orders of the defined types were placed in this encounter.  Meds ordered this encounter  Medications   methocarbamol (ROBAXIN) 500 MG tablet    Sig: Take 1 tablet (500 mg total) by mouth 2 (two) times daily as needed.    Dispense:  30 tablet    Refill:  6    Imaging: No results found.  PMFS History: Patient Active Problem List   Diagnosis Date Noted   Primary osteoarthritis of right knee 02/11/2021   Status post total right knee replacement 02/11/2021   Chronic low back pain with sciatica 09/18/2020   Encounter for health maintenance examination in adult 01/09/2020   Radicular pain of right lower extremity 04/18/2019   Other intervertebral disc degeneration, lumbar region  04/18/2019   Radiculopathy of lumbosacral region 03/21/2019   Right leg pain 03/21/2019   Chest pain 09/23/2018   Neuropathy involving both lower extremities 09/23/2018   Screen for colon cancer 05/17/2018   Vaccine counseling 05/17/2018   Fatty liver 05/17/2018   Screening for cervical cancer 05/17/2018   Hypothyroidism 05/17/2018   Need for pneumococcal vaccination 05/17/2018   Generalized abdominal tenderness 05/17/2018   Need for influenza vaccination 04/13/2018   Other fatigue 02/08/2018   Edema 01/25/2018   Renal failure (ARF), acute on chronic (HCC) 01/20/2018   Normocytic normochromic anemia 01/20/2018   Tremor 01/05/2018   Vitamin D deficiency 01/05/2018   RUQ abdominal pain 10/27/2017   Hemorrhoids 10/27/2017   High risk medication use 09/25/2017   Type 2 diabetes mellitus with neurological complications ([place]) [id number]   Proteinuria 09/02/2017   Paresthesia 09/02/2017   IgA nephropathy 06/10/2017   Language barrier 06/10/2017   Gastroesophageal reflux disease 06/10/2017   SOB (shortness of breath) 02/24/2017   Hyperlipidemia 04/21/2016   Chronic right-sided low back pain with right-sided sciatica 01/29/2016   Morbid obesity ([place]) 01/29/2016   Hypokalemia 01/29/2016   Essential hypertension 01/29/2016   Past Medical History:  Diagnosis Date   Diabetes mellitus without complication ([place])    Dr. Kelton Pillar   Fatty liver 2019   elevated LFTs, negative for Hep B and Hep C 04/2018   GERD (gastroesophageal reflux disease)  Hepatitis    Hyperlipidemia    Hypertension    Hypothyroidism 2019   IgA nephropathy 2019   per biopsy, Dr. Corliss Parish   Language barrier    Lumbar stenosis    Dr. Louanne Skye   Obesity    Osteoarthritis    Proteinuria     Family History  Problem Relation Age of Onset   Diabetes Mother    Hypertension Father    Colon cancer Neg Hx    Stomach cancer Neg Hx    Esophageal cancer Neg Hx    Rectal cancer Neg Hx    Liver cancer Neg  Hx     Past Surgical History:  Procedure Laterality Date   RENAL BIOPSY  2018   IgA nephropathy, Dr. Corliss Parish   TOTAL KNEE ARTHROPLASTY Right 02/11/2021   Procedure: RIGHT TOTAL KNEE ARTHROPLASTY;  Surgeon: Leandrew Koyanagi, MD;  Location: [place];  Service: Orthopedics;  Laterality: Right;   Social History   Occupational History   Occupation: homemaker  Tobacco Use   Smoking status: Never   Smokeless tobacco: Never  Vaping Use   Vaping Use: Never used  Substance and Sexual Activity   Alcohol use: Never   Drug use: No   Sexual activity: Not on file

## 2021-02-27 ENCOUNTER — Other Ambulatory Visit: Payer: Self-pay

## 2021-02-27 ENCOUNTER — Ambulatory Visit: Payer: Medicaid Other

## 2021-02-27 DIAGNOSIS — G8929 Other chronic pain: Secondary | ICD-10-CM

## 2021-02-27 DIAGNOSIS — R262 Difficulty in walking, not elsewhere classified: Secondary | ICD-10-CM

## 2021-02-27 DIAGNOSIS — R531 Weakness: Secondary | ICD-10-CM

## 2021-02-27 DIAGNOSIS — M79604 Pain in right leg: Secondary | ICD-10-CM

## 2021-02-27 DIAGNOSIS — M25561 Pain in right knee: Secondary | ICD-10-CM | POA: Diagnosis not present

## 2021-02-27 DIAGNOSIS — M25661 Stiffness of right knee, not elsewhere classified: Secondary | ICD-10-CM

## 2021-02-27 DIAGNOSIS — M62830 Muscle spasm of back: Secondary | ICD-10-CM

## 2021-02-27 DIAGNOSIS — M545 Low back pain, unspecified: Secondary | ICD-10-CM

## 2021-02-27 NOTE — Therapy (Signed)
[place], [name], [address] Phone: [phone]   Fax:  [phone]  Physical Therapy Treatment  Patient Details  Name: Linda Black MRN: [id number] Date of Birth: 1970-09-30 Referring Provider (PT): Carlena Hurl   Encounter Date: 02/27/2021   PT End of Session - 02/27/21 1130     Visit Number 3    Number of Visits 17    Date for PT Re-Evaluation 04/12/21    Authorization Type UHC MCD, Request additional auth at last authorized visit, 27 annual visits    PT Start Time 1045    PT Stop Time 1130    PT Time Calculation (min) 45 min    Equipment Utilized During Treatment Gait belt    Activity Tolerance Treatment limited secondary to medical complications (Comment);Patient limited by pain;Patient tolerated treatment well    Behavior During Therapy [facility] for tasks assessed/performed             Past Medical History:  Diagnosis Date   Diabetes mellitus without complication ([place])    Dr. Kelton Pillar   Fatty liver 2019   elevated LFTs, negative for Hep B and Hep C 04/2018   GERD (gastroesophageal reflux disease)    Hepatitis    Hyperlipidemia    Hypertension    Hypothyroidism 2019   IgA nephropathy 2019   per biopsy, Dr. Corliss Parish   Language barrier    Lumbar stenosis    Dr. Louanne Skye   Obesity    Osteoarthritis    Proteinuria     Past Surgical History:  Procedure Laterality Date   RENAL BIOPSY  2018   IgA nephropathy, Dr. Corliss Parish   TOTAL KNEE ARTHROPLASTY Right 02/11/2021   Procedure: RIGHT TOTAL KNEE ARTHROPLASTY;  Surgeon: Leandrew Koyanagi, MD;  Location: [place];  Service: Orthopedics;  Laterality: Right;    There were no vitals filed for this visit.   Subjective Assessment - 02/27/21 1042     Subjective Pt reports having her surgical sutures removed yesterday and is feeling well today. She reports doing her HEP daily, with a focus on ROM exercises.    Patient is  accompained by: Interpreter    Limitations Sitting;Standing;Walking;House hold activities    Currently in Pain? No/denies    Pain Score 0-No pain    Pain Location Knee    Pain Orientation Right                [facility] PT Assessment - 02/27/21 0001       Observation/Other Assessments   Observations Surgical scar is healing well, no signs of infection      AROM   Right Knee Flexion 79      PROM   Right Knee Flexion 85   AAROM with green strap                          [facility] Adult PT Treatment/Exercise - 02/27/21 0001       Knee/Hip Exercises: Machines for Strengthening   Other Machine Nustep level 1 x5 minutes at 30-40 spm      Knee/Hip Exercises: Standing   Other Standing Knee Exercises quarter squat 3x8 in // bars      Knee/Hip Exercises: Seated   Long Arc Quad Strengthening   2x10 with YTB   Hamstring Curl Strengthening   2x10 with YTB  PT Education - 02/27/21 1123     Education Details Pt educated about importance of early knee flexion ROM and quadriceps strengthening. Also educated on proper form for newly added exercises.    Person(s) Educated Patient    Methods Explanation;Demonstration;Verbal cues;Handout    Comprehension Verbalized understanding;Returned demonstration;Verbal cues required              PT Short Term Goals - 02/20/21 1131       PT SHORT TERM GOAL #1   Title Pt will demonstrate understanding and adherence of HEP in order to promote independence in the management of her impairments.    Baseline HEP given at eval.    Time 4    Period Weeks    Status New    Target Date 03/15/21      PT SHORT TERM GOAL #2   Title Pt will demonstrate R knee flexion AROM of 100 degrees of better in order to progress to normal gait pattern.    Baseline R knee flexion AROM = 55 degrees.    Time 4    Period Weeks    Status New    Target Date 03/15/21      PT SHORT TERM GOAL #3   Title Pt will be able to  achieve 10 active SLR's in order to demonstrate improved functional quadriceps strength.    Baseline Pt performed 20 SLR on R    Time 4    Period Weeks    Status Achieved    Target Date 03/15/21               PT Long Term Goals - 02/15/21 1115       PT LONG TERM GOAL #1   Title Pt will demonstrate 135 degrees of R knee flexion AROM with 2/10 pain or less in order to return to functional activities such as grocery shopping without limitation.    Baseline 55 degrees with 10/10 pain    Time 8    Period Weeks    Status New    Target Date 04/12/21      PT LONG TERM GOAL #2   Title Pt will achieve 0 degrees of R knee extension AROM with 2/10 pain or less in order to promote normal gait pattern with heel strike.    Baseline 15 degrees shy of full extension with 10/10 pain.    Time 8    Period Weeks    Status New    Target Date 04/12/21      PT LONG TERM GOAL #3   Title Pt will demonstrate R knee extension and flexion MMT of 4+/5 or greater in order to progress LE strengthening regimen without limitation.    Baseline R knee flexion and extension MMT =2/5    Time 8    Period Weeks    Status New    Target Date 04/12/21      PT LONG TERM GOAL #4   Title Pt will report maximum daily pain of 3/10 or less in order to be more functionally independent without limitation due to pain.    Baseline 10/10 pain throughout the day.    Time 8    Period Weeks    Status New    Target Date 04/12/21                   Plan - 02/27/21 1203     Clinical Impression Statement Pt responded well to all treatment today, demonstrating good form and no  increased pain with all exercises. She demonstrates a 14 degree improvement in R knee flexion AROM, as well as improved functional R quad strength, demonstrating the ability to perform resisted LAQ.  She will continue to benefit from skilled PT to address her primary impairments and return to her prior level of function without limitation.  Utilizing [facility] TKR protocol during [place].    Personal Factors and Comorbidities Comorbidity 3+    Comorbidities See medical Hx    Examination-Activity Limitations Bathing;Dressing;Sit;Bend;Squat;Stairs;Locomotion Level;Stand    Examination-Participation Restrictions Driving;Cleaning;Laundry;Yard Work    Stability/Clinical Decision Making Stable/Uncomplicated    Designer, jewellery Low    Rehab Potential Good    PT Frequency 2x / week    PT Duration 8 weeks    PT Treatment/Interventions ADLs/Self Care Home Management;Aquatic Therapy;Biofeedback;Moist Heat;Electrical Stimulation;Cryotherapy;DME Instruction;Therapeutic activities;Stair training;Gait training;Functional mobility training;Therapeutic exercise;Balance training;Neuromuscular re-education;Scar mobilization;Compression bandaging;Patient/family education;Manual techniques;Passive range of motion;Joint Manipulations    PT Next Visit Plan continue R knee ROM/ LE strengthening exercises, IntroduceTib-fem and patellar joint mobilization, continue to utilize [facility] TKR protocol    PT [place] and Agree with Plan of Care Patient             Patient will benefit from skilled therapeutic intervention in order to improve the following deficits and impairments:  Abnormal gait, Decreased balance, Decreased endurance, Decreased mobility, Difficulty walking, Hypomobility, Increased edema, Decreased range of motion, Decreased activity tolerance, Decreased strength, Pain, Impaired flexibility  Visit Diagnosis: Chronic pain of right knee  General weakness  Difficulty in walking, not elsewhere classified  Stiffness of right knee, not elsewhere classified  Chronic bilateral low back pain without sciatica  Muscle spasm of back  Acute right-sided low back pain, unspecified whether sciatica present  Pain in right leg     Problem List Patient  Active Problem List   Diagnosis Date Noted   Primary osteoarthritis of right knee 02/11/2021   Status post total right knee replacement 02/11/2021   Chronic low back pain with sciatica 09/18/2020   Encounter for health maintenance examination in adult 01/09/2020   Radicular pain of right lower extremity 04/18/2019   Other intervertebral disc degeneration, lumbar region 04/18/2019   Radiculopathy of lumbosacral region 03/21/2019   Right leg pain 03/21/2019   Chest pain 09/23/2018   Neuropathy involving both lower extremities 09/23/2018   Screen for colon cancer 05/17/2018   Vaccine counseling 05/17/2018   Fatty liver 05/17/2018   Screening for cervical cancer 05/17/2018   Hypothyroidism 05/17/2018   Need for pneumococcal vaccination 05/17/2018   Generalized abdominal tenderness 05/17/2018   Need for influenza vaccination 04/13/2018   Other fatigue 02/08/2018   Edema 01/25/2018   Renal failure (ARF), acute on chronic (HCC) 01/20/2018   Normocytic normochromic anemia 01/20/2018   Tremor 01/05/2018   Vitamin D deficiency 01/05/2018   RUQ abdominal pain 10/27/2017   Hemorrhoids 10/27/2017   High risk medication use 09/25/2017   Type 2 diabetes mellitus with neurological complications ([place]) [id number]   Proteinuria 09/02/2017   Paresthesia 09/02/2017   IgA nephropathy 06/10/2017   Language barrier 06/10/2017   Gastroesophageal reflux disease 06/10/2017   SOB (shortness of breath) 02/24/2017   Hyperlipidemia 04/21/2016   Chronic right-sided low back pain with right-sided sciatica 01/29/2016   Morbid obesity ([place]) 01/29/2016   Hypokalemia 01/29/2016   Essential hypertension 01/29/2016    Vanessa [place], PT, DPT 02/27/21 12:08 PM   [place]  [place], [name], [address] Phone: [phone]   Fax:  [phone]  Name: Linda Black MRN: [id number] Date of Birth: 1971/07/19

## 2021-02-27 NOTE — Patient Instructions (Signed)
  [id number]

## 2021-03-11 ENCOUNTER — Ambulatory Visit: Payer: Medicaid Other | Attending: Medical

## 2021-03-11 ENCOUNTER — Other Ambulatory Visit: Payer: Self-pay

## 2021-03-11 DIAGNOSIS — G8929 Other chronic pain: Secondary | ICD-10-CM | POA: Insufficient documentation

## 2021-03-11 DIAGNOSIS — M25561 Pain in right knee: Secondary | ICD-10-CM | POA: Diagnosis present

## 2021-03-11 DIAGNOSIS — M25661 Stiffness of right knee, not elsewhere classified: Secondary | ICD-10-CM | POA: Diagnosis present

## 2021-03-11 DIAGNOSIS — R262 Difficulty in walking, not elsewhere classified: Secondary | ICD-10-CM | POA: Diagnosis present

## 2021-03-11 DIAGNOSIS — R531 Weakness: Secondary | ICD-10-CM | POA: Diagnosis present

## 2021-03-11 NOTE — Therapy (Signed)
[place], [name], [address] Phone: [phone]   Fax:  [phone]  Physical Therapy Treatment  Patient Details  Name: Linda Black MRN: [id number] Date of Birth: 17-May-1971 Referring Provider (PT): Carlena Hurl   Encounter Date: 03/11/2021   PT End of Session - 03/11/21 1159     Visit Number 4    Number of Visits 17    Date for PT Re-Evaluation 04/12/21    Authorization Type [employer] MCD, Request additional auth at last authorized visit, 27 annual visits    PT Start Time 1140    PT Stop Time 1215    PT Time Calculation (min) 35 min    Activity Tolerance Treatment limited secondary to medical complications (Comment);Patient limited by pain;Patient tolerated treatment well    Behavior During Therapy [facility] for tasks assessed/performed             Past Medical History:  Diagnosis Date   Diabetes mellitus without complication ([place])    Dr. Kelton Pillar   Fatty liver 2019   elevated LFTs, negative for Hep B and Hep C 04/2018   GERD (gastroesophageal reflux disease)    Hepatitis    Hyperlipidemia    Hypertension    Hypothyroidism 2019   IgA nephropathy 2019   per biopsy, Dr. Corliss Parish   Language barrier    Lumbar stenosis    Dr. Louanne Skye   Obesity    Osteoarthritis    Proteinuria     Past Surgical History:  Procedure Laterality Date   RENAL BIOPSY  2018   IgA nephropathy, Dr. Corliss Parish   TOTAL KNEE ARTHROPLASTY Right 02/11/2021   Procedure: RIGHT TOTAL KNEE ARTHROPLASTY;  Surgeon: Leandrew Koyanagi, MD;  Location: [facility];  Service: Orthopedics;  Laterality: Right;    There were no vitals filed for this visit.   Subjective Assessment - 03/11/21 1138     Subjective Pt reports sharp pain in her R knee today (10/10) with continued associated swelling. She also reports that her knee has been warm. She associates this with running out of her pain medication yesterday. She does not  associate the increase in pain with any increase in exercise or separate injury. She reports trying to get in touch with her doctor for a refill but has been unable to reach him. She states she has had no systemic signs of infection. She adds that she has been doing her exercises to the best of her ability.    Patient is accompained by: Interpreter    Limitations Sitting;Standing;Walking;House hold activities    Patient Stated Goals Return to walking, sitting, household chores    Currently in Pain? Yes    Pain Score 10-Worst pain ever    Pain Location Knee    Pain Orientation Right    Pain Descriptors / Indicators Aching;Constant;Discomfort;Sore    Pain Type Surgical pain                [facility] PT Assessment - 03/11/21 0001       Observation/Other Assessments   Observations Surgical scar is healing well, minor warmth around joint, no signs of infection                           [facility] Adult PT Treatment/Exercise - 03/11/21 0001       Knee/Hip Exercises: Supine   Hip Adduction Isometric Strengthening   2x10 with 5sec hold into ball  Straight Leg Raises Strengthening   2x10 with slow lowering     Modalities   Modalities Cryotherapy      Cryotherapy   Number Minutes Cryotherapy 10 Minutes    Cryotherapy Location Knee    Type of Cryotherapy Ice pack      Manual Therapy   Manual Therapy Edema management    Edema Management proximal edema massage to R calf/ thigh                    PT Education - 03/11/21 1158     Education Details Pt instructed to follow up with her PCP regarding her pain medication so that more may be tolerated during her therapy sessions. Also educated to continued PROM, ice, and elevation at home.    Person(s) Educated Patient    Methods Explanation;Demonstration;Verbal cues;Handout    Comprehension Verbalized understanding;Returned demonstration;Verbal cues required              PT Short Term Goals - 02/20/21 1131        PT SHORT TERM GOAL #1   Title Pt will demonstrate understanding and adherence of HEP in order to promote independence in the management of her impairments.    Baseline HEP given at eval.    Time 4    Period Weeks    Status New    Target Date 03/15/21      PT SHORT TERM GOAL #2   Title Pt will demonstrate R knee flexion AROM of 100 degrees of better in order to progress to normal gait pattern.    Baseline R knee flexion AROM = 55 degrees.    Time 4    Period Weeks    Status New    Target Date 03/15/21      PT SHORT TERM GOAL #3   Title Pt will be able to achieve 10 active SLR's in order to demonstrate improved functional quadriceps strength.    Baseline Pt performed 20 SLR on R    Time 4    Period Weeks    Status Achieved    Target Date 03/15/21               PT Long Term Goals - 02/15/21 1115       PT LONG TERM GOAL #1   Title Pt will demonstrate 135 degrees of R knee flexion AROM with 2/10 pain or less in order to return to functional activities such as grocery shopping without limitation.    Baseline 55 degrees with 10/10 pain    Time 8    Period Weeks    Status New    Target Date 04/12/21      PT LONG TERM GOAL #2   Title Pt will achieve 0 degrees of R knee extension AROM with 2/10 pain or less in order to promote normal gait pattern with heel strike.    Baseline 15 degrees shy of full extension with 10/10 pain.    Time 8    Period Weeks    Status New    Target Date 04/12/21      PT LONG TERM GOAL #3   Title Pt will demonstrate R knee extension and flexion MMT of 4+/5 or greater in order to progress LE strengthening regimen without limitation.    Baseline R knee flexion and extension MMT =2/5    Time 8    Period Weeks    Status New    Target Date 04/12/21  PT LONG TERM GOAL #4   Title Pt will report maximum daily pain of 3/10 or less in order to be more functionally independent without limitation due to pain.    Baseline 10/10 pain throughout  the day.    Time 8    Period Weeks    Status New    Target Date 04/12/21                   Plan - 03/11/21 1159     Clinical Impression Statement Pt arrived 10 minutes late to her appointment today, which led to a truncated session. In addition to this, the pt recently ran out of her pain medication, leading to drastically increased pain and the inability of the pt to participate in most exercises today. She reports improved sxs following manual edema massage and cryotherapy. Following these treatments the pt was able to tolerate mat exercises. She will continue to benefit from skilled PT to address her primary impairments and return to her prior level of function without limitation.    Personal Factors and Comorbidities Comorbidity 3+    Comorbidities See medical Hx    Examination-Activity Limitations Bathing;Dressing;Sit;Bend;Squat;Stairs;Locomotion Level;Stand    Examination-Participation Restrictions Driving;Cleaning;Laundry;Yard Work    Stability/Clinical Decision Making Stable/Uncomplicated    Designer, jewellery Low    Rehab Potential Good    PT Frequency 2x / week    PT Duration 8 weeks    PT Treatment/Interventions ADLs/Self Care Home Management;Aquatic Therapy;Biofeedback;Moist Heat;Electrical Stimulation;Cryotherapy;DME Instruction;Therapeutic activities;Stair training;Gait training;Functional mobility training;Therapeutic exercise;Balance training;Neuromuscular re-education;Scar mobilization;Compression bandaging;Patient/family education;Manual techniques;Passive range of motion;Joint Manipulations    PT Next Visit Plan continue R knee ROM/ LE strengthening exercises, IntroduceTib-fem and patellar joint mobilization, continue to utilize [facility] TKR protocol    PT Home Exercise Plan [id number]    Consulted and Agree with Plan of Care Patient             Patient will benefit from skilled therapeutic intervention in order to improve the  following deficits and impairments:  Abnormal gait, Decreased balance, Decreased endurance, Decreased mobility, Difficulty walking, Hypomobility, Increased edema, Decreased range of motion, Decreased activity tolerance, Decreased strength, Pain, Impaired flexibility  Visit Diagnosis: Chronic pain of right knee  General weakness  Difficulty in walking, not elsewhere classified  Stiffness of right knee, not elsewhere classified     Problem List Patient Active Problem List   Diagnosis Date Noted   Primary osteoarthritis of right knee 02/11/2021   Status post total right knee replacement 02/11/2021   Chronic low back pain with sciatica 09/18/2020   Encounter for health maintenance examination in adult 01/09/2020   Radicular pain of right lower extremity 04/18/2019   Other intervertebral disc degeneration, lumbar region 04/18/2019   Radiculopathy of lumbosacral region 03/21/2019   Right leg pain 03/21/2019   Chest pain 09/23/2018   Neuropathy involving both lower extremities 09/23/2018   Screen for colon cancer 05/17/2018   Vaccine counseling 05/17/2018   Fatty liver 05/17/2018   Screening for cervical cancer 05/17/2018   Hypothyroidism 05/17/2018   Need for pneumococcal vaccination 05/17/2018   Generalized abdominal tenderness 05/17/2018   Need for influenza vaccination 04/13/2018   Other fatigue 02/08/2018   Edema 01/25/2018   Renal failure (ARF), acute on chronic (HCC) 01/20/2018   Normocytic normochromic anemia 01/20/2018   Tremor 01/05/2018   Vitamin D deficiency 01/05/2018   RUQ abdominal pain 10/27/2017   Hemorrhoids 10/27/2017   High risk medication use 09/25/2017   Type 2 diabetes  mellitus with neurological complications ([place]) [id number]   Proteinuria 09/02/2017   Paresthesia 09/02/2017   IgA nephropathy 06/10/2017   Language barrier 06/10/2017   Gastroesophageal reflux disease 06/10/2017   SOB (shortness of breath) 02/24/2017   Hyperlipidemia 04/21/2016    Chronic right-sided low back pain with right-sided sciatica 01/29/2016   Morbid obesity ([place]) 01/29/2016   Hypokalemia 01/29/2016   Essential hypertension 01/29/2016    Vanessa Humboldt, PT, DPT 03/11/21 12:17 PM   [facility] [address] [place], [name], [address] Phone: [phone]   Fax:  [phone]  Name: Linda Black MRN: [id number] Date of Birth: 05/02/1971

## 2021-03-11 NOTE — Patient Instructions (Addendum)
   [id number]

## 2021-03-19 ENCOUNTER — Other Ambulatory Visit: Payer: Self-pay

## 2021-03-19 ENCOUNTER — Ambulatory Visit (INDEPENDENT_AMBULATORY_CARE_PROVIDER_SITE_OTHER): Payer: Medicaid Other | Admitting: Medical

## 2021-03-19 ENCOUNTER — Encounter: Payer: Self-pay | Admitting: Medical

## 2021-03-19 VITALS — BP 124/80 | HR 84 | Wt 172.6 lb

## 2021-03-19 DIAGNOSIS — H538 Other visual disturbances: Secondary | ICD-10-CM

## 2021-03-19 DIAGNOSIS — Z79899 Other long term (current) drug therapy: Secondary | ICD-10-CM | POA: Diagnosis not present

## 2021-03-19 DIAGNOSIS — Z23 Encounter for immunization: Secondary | ICD-10-CM | POA: Diagnosis not present

## 2021-03-19 DIAGNOSIS — Z758 Other problems related to medical facilities and other health care: Secondary | ICD-10-CM

## 2021-03-19 DIAGNOSIS — Z1211 Encounter for screening for malignant neoplasm of colon: Secondary | ICD-10-CM

## 2021-03-19 DIAGNOSIS — Z789 Other specified health status: Secondary | ICD-10-CM

## 2021-03-19 DIAGNOSIS — G479 Sleep disorder, unspecified: Secondary | ICD-10-CM

## 2021-03-19 MED ORDER — CLONAZEPAM 0.5 MG PO TABS: 0.5000 mg | ORAL_TABLET | Freq: Every evening | ORAL | 0 refills | Status: DC | PRN

## 2021-03-19 NOTE — Progress Notes (Signed)
Subjective: Chief Complaint  Patient presents with   med check     Med check, since surgery she is having trouble sleeping and nausea. Her right eye is blurry for the last 2 weeks- hasn't never seen eye doctor     Medical team: Dr. Basil Dess, orthopedics Dr. Collier Flowers, endocrinology Dr. Corliss Parish, nephrology Tieler Cournoyer, Camelia Eng, PA-C here for primary care   She is here today with interpreter from language resources [place].   She notes some blurriness in right eye.  No loss of vision.   Blurriness x 2 weeks.   No injury or trauma.   No eye doctor.    Recently had knee surgery.  Been having a hard time sleeping since surgery.  Not in a lot of pain, but sleep is just messed up currently.  Lately has had loss of appetite.     No recent med changes.  Otherwise compliant with medications.  Blood sugars been at goal under 130 fasting.    She notes compliance with the following:  Losartan 100 mg daily Toprol-XL 25 mg daily Amlodipine 10 mg daily Lasix 40 mg twice daily Potassium chloride 20 mEq daily Vitamin D 1000 units daily Kerendia 10 mg daily Levothyroxine 25 mcg daily Crestor 40 mg daily  We discussed cancer screening and she has never had a colon cancer screening  Past Medical History:  Diagnosis Date   Diabetes mellitus without complication ([place])    Dr. Kelton Pillar   Fatty liver 2019   elevated LFTs, negative for Hep B and Hep C 04/2018   GERD (gastroesophageal reflux disease)    Hepatitis    Hyperlipidemia    Hypertension    Hypothyroidism 2019   IgA nephropathy 2019   per biopsy, Dr. Corliss Parish   Language barrier    Lumbar stenosis    Dr. Louanne Skye   Obesity    Osteoarthritis    Proteinuria    Current Outpatient Medications on File Prior to Visit  Medication Sig Dispense Refill   ACCU-CHEK AVIVA PLUS test strip TEST TWICE A DAY 100 strip 3   amLODipine (NORVASC) 10 MG tablet Take 1 tablet (10 mg total) by mouth daily. 90 tablet  3   cholecalciferol (VITAMIN D3) 25 MCG (1000 UNIT) tablet Take 1 tablet (1,000 Units total) by mouth daily. 90 tablet 3   furosemide (LASIX) 40 MG tablet Take 1 tablet (40 mg total) by mouth 2 (two) times daily. 180 tablet 0   Insulin Pen Needle 32G X 4 MM MISC 1 each by Does not apply route at bedtime. 100 each 11   KERENDIA 10 MG TABS Take 10 mg by mouth daily.     levothyroxine (SYNTHROID) 25 MCG tablet TAKE 1 TABLET(25 MCG) BY MOUTH DAILY BEFORE AND BREAKFAST 90 tablet 1   losartan (COZAAR) 100 MG tablet Take 1 tablet (100 mg total) by mouth daily. 90 tablet 0   metoprolol succinate (TOPROL-XL) 25 MG 24 hr tablet 1 tablet po daily 90 tablet 3   potassium chloride SA (KLOR-CON) 20 MEQ tablet Take 1 tablet (20 mEq total) by mouth daily. 90 tablet 3   rosuvastatin (CRESTOR) 40 MG tablet TAKE 1 TABLET(40 MG) BY MOUTH DAILY (Patient taking differently: Take 40 mg by mouth daily.) 90 tablet 3   No current facility-administered medications on file prior to visit.   Past Surgical History:  Procedure Laterality Date   RENAL BIOPSY  2018   IgA nephropathy, Dr. Corliss Parish   TOTAL KNEE  ARTHROPLASTY Right 02/11/2021   Procedure: RIGHT TOTAL KNEE ARTHROPLASTY;  Surgeon: Leandrew Koyanagi, MD;  Location: [facility];  Service: Orthopedics;  Laterality: Right;     ROS as in subjective    Objective: BP 124/80   Pulse 84   Wt 172 lb 9.6 oz (78.3 kg)   LMP 03/23/2017   SpO2 99%   BMI 29.63 kg/m   Wt Readings from Last 3 Encounters:  03/19/21 172 lb 9.6 oz (78.3 kg)  02/26/21 172 lb (78 kg)  02/11/21 172 lb (78 kg)   BP Readings from Last 3 Encounters:  03/19/21 124/80  02/12/21 134/77  01/31/21 (!) 152/92    General appearence: alert, no distress, WD/WN PERRLA, EOMi Heart: RRR, normal S1, S2, no murmurs Lungs: CTA bilaterally, no wheezes, rhonchi, or rales Right knee surgical wound with steri strips present, walking with cane, guarded of right leg    Assessment: Encounter  Diagnoses  Name Primary?   Sleep disturbance Yes   Blurred vision    Language barrier    High risk medication use    Screen for colon cancer    Need for Tdap vaccination        Plan: We discussed her symptoms and concerns  We discussed the following recommendations which were also printed for her to have her daughter reference and review with her.    Blurred vision, need for diabetic eye exam. We scheduled you an appointment with Dr. Wyatt Portela for 1pm tomorrow    Sleep disturbance Hopefully this is only short term I prescribed a medication you can try at bedtime the next few nights called Clonazepam.       Vaccines: I recommend a COVID booster vaccine.  You decline covid booster.  You are due for tetanus booster  I recommend a yearly flu shot in the fall.   Counseled on the Tdap (tetanus, diptheria, and acellular pertussis) vaccine.  Vaccine information sheet given. Tdap vaccine given after consent obtained.    Cancer screening: I recommend yearly mammogram Check your breast monthly for lumps or bumps or new changes  Please call to schedule your mammogram.   [facility]  [id number] [address] [place], [place] [address]    I recommend a colon cancer screening such as colonoscopy We will make referral to gastroenterology for consult on this.   Continue your medications as usual    Sonya was seen today for med check .  Diagnoses and all orders for this visit:  Sleep disturbance  Blurred vision  Language barrier  High risk medication use  Screen for colon cancer -     Ambulatory referral to Gastroenterology  Need for Tdap vaccination -     Tdap vaccine greater than or equal to 7yo IM  Other orders -     clonazePAM (KLONOPIN) 0.5 MG tablet; Take 1 tablet (0.5 mg total) by mouth at bedtime as needed for anxiety.   F/ u pending referrals

## 2021-03-19 NOTE — Patient Instructions (Signed)
  Blurred vision, need for diabetic eye exam. We scheduled you an appointment with Dr. Wyatt Portela for 1pm tomorrow    Sleep disturbance Hopefully this is only short term I prescribed a medication you can try at bedtime the next few nights called Clonazepam.       Vaccines: I recommend a COVID booster vaccine.  You decline covid booster.  You are due for tetanus booster  I recommend a yearly flu shot in the fall.    Cancer screening: I recommend yearly mammogram Check your breast monthly for lumps or bumps or new changes  Please call to schedule your mammogram.   [facility]  [id number] [address], [place], [place] [address]    I recommend a colon cancer screening such as colonoscopy We will make referral to gastroenterology for consult on this.   Continue your medications as usual  Nhn m?, c?n khm m?t cho b?nh nhn ti?u ???ng. Chng ti ? s?p x?p cho b?n m?t cu?c h?n v?i Ti?n s? [place] vo 1 gi? chi?u ngy mai    R?i lo?n gi?c ng? Hy v?ng r?ng ?y ch? l ng?n h?n Ti ? k m?t lo?i thu?c m b?n c th? th? tr??c khi ?i ng? vi ?m t?i c tn l Clonazepam.    V?c-xin: Ti khuyn b?n nn tim v?c xin t?ng c??ng COVID. B?n t? ch?i t?ng c??ng covid.  B?n ?ang chu?n b? tim nh?c l?i u?n vn  Ti khuyn b?n nn tim phng cm hng n?m vo ma thu.    T?m sot ung th?: Ti khuyn b?n nn ch?p X quang tuy?n v hng n?m Ki?m tra v c?a b?n hng thng ?? tm cc c?c u ho?c v?t s?ng ho?c nh?ng thay ??i m?i  Vui lng g?i ?i?n ?? ??t l?ch ch?p X-quang tuy?n v c?a b?n.  Trung tm hnh ?[place] [phone] [address] [place], [place] [address]    Ti khuyn b?n nn ki?m tra ung th? ru?t k?t ch?ng h?n nh? n?i soi ??i trng Chng ti s? gi?i thi?u ??n chuyn khoa tiu ha ?? tham kh?o  ki?n v? v?n ?? ny.   Ti?p t?c dng thu?c c?a b?n nh? bnh th??ng

## 2021-03-25 ENCOUNTER — Ambulatory Visit: Payer: Medicaid Other

## 2021-03-25 ENCOUNTER — Other Ambulatory Visit: Payer: Self-pay | Admitting: Physician Assistant

## 2021-03-25 ENCOUNTER — Other Ambulatory Visit: Payer: Self-pay

## 2021-03-25 DIAGNOSIS — R262 Difficulty in walking, not elsewhere classified: Secondary | ICD-10-CM

## 2021-03-25 DIAGNOSIS — M25561 Pain in right knee: Secondary | ICD-10-CM | POA: Diagnosis not present

## 2021-03-25 DIAGNOSIS — G8929 Other chronic pain: Secondary | ICD-10-CM

## 2021-03-25 DIAGNOSIS — R531 Weakness: Secondary | ICD-10-CM

## 2021-03-25 DIAGNOSIS — M25661 Stiffness of right knee, not elsewhere classified: Secondary | ICD-10-CM

## 2021-03-25 NOTE — Therapy (Signed)
[place], [name], [address] Phone: [phone]   Fax:  [phone]  Physical Therapy Treatment  Patient Details  Name: Linda Black MRN: [id number] Date of Birth: 26-Jan-1971 Referring Provider (PT): Linda Black   Encounter Date: 03/25/2021   PT End of Session - 03/25/21 1301     Visit Number 5    Number of Visits 17    Date for PT Re-Evaluation 04/12/21    Authorization Type [employer] MCD, Request additional auth at last authorized visit, 27 annual visits    PT Start Time 1220    PT Stop Time 1315    PT Time Calculation (min) 55 min    Activity Tolerance Patient limited by pain;Patient tolerated treatment well    Behavior During Therapy [facility] for tasks assessed/performed             Past Medical History:  Diagnosis Date   Diabetes mellitus without complication ([place])    Dr. Kelton Pillar   Fatty liver 2019   elevated LFTs, negative for Hep B and Hep C 04/2018   GERD (gastroesophageal reflux disease)    Hepatitis    Hyperlipidemia    Hypertension    Hypothyroidism 2019   IgA nephropathy 2019   per biopsy, Dr. Corliss Parish   Language barrier    Lumbar stenosis    Dr. Louanne Skye   Obesity    Osteoarthritis    Proteinuria     Past Surgical History:  Procedure Laterality Date   RENAL BIOPSY  2018   IgA nephropathy, Dr. Corliss Parish   TOTAL KNEE ARTHROPLASTY Right 02/11/2021   Procedure: RIGHT TOTAL KNEE ARTHROPLASTY;  Surgeon: Leandrew Koyanagi, MD;  Location: [place];  Service: Orthopedics;  Laterality: Right;    There were no vitals filed for this visit.   Subjective Assessment - 03/25/21 1220     Subjective Pt reports that she did not receive a medication refill from her doctor, but has been taking OTC Tylenol for pain. She reports that her pain has improved since last visit and that her swelling has improved. She reports doing her HEP every day, as well as walking outside her house  every day. She reports being able to walk about 141f without any difficulty.    Patient is accompained by: Interpreter   [name]  Limitations Sitting;Standing;Walking;House hold activities    Patient Stated Goals Return to walking, sitting, household chores    Currently in Pain? Yes    Pain Score 7     Pain Location Knee    Pain Orientation Right    Pain Descriptors / Indicators Aching;Constant;Discomfort;Sore    Pain Type Surgical pain    Pain Onset More than a month ago    Pain Frequency Constant                [facility] PT Assessment - 03/25/21 0001       Observation/Other Assessments   Observations Surgical scar is healing well, no signs of infection, improved swelling since last visit      AROM   Right Knee Flexion 75   painful                          [facility] Adult PT Treatment/Exercise - 03/25/21 0001       Exercises   Exercises Knee/Hip      Knee/Hip Exercises: Aerobic   Stationary Bike Forward/ backward rocking into end-range knee flexion  x5 minutes      Knee/Hip Exercises: Standing   Lateral Step Up Step Height: 4"   2x10 BIL   Forward Step Up Step Height: 4"   2x10   Other Standing Knee Exercises quarter squat 3x8 in // bars      Knee/Hip Exercises: Seated   Long Arc Quad Strengthening   5# 2 up 1 down eccentric 2x8     Modalities   Modalities Cryotherapy      Cryotherapy   Number Minutes Cryotherapy 10 Minutes    Cryotherapy Location Knee    Type of Cryotherapy Ice pack                    PT Education - 03/25/21 1257     Education Details Pt educated about importance of achieving WNL knee flexion ROM, as well as proper form when performing exercises.    Person(s) Educated Patient    Methods Explanation;Demonstration;Tactile cues;Verbal cues    Comprehension Verbal cues required;Returned demonstration;Tactile cues required;Verbalized understanding              PT Short Term Goals - 02/20/21 1131       PT SHORT  TERM GOAL #1   Title Pt will demonstrate understanding and adherence of HEP in order to promote independence in the management of her impairments.    Baseline HEP given at eval.    Time 4    Period Weeks    Status New    Target Date 03/15/21      PT SHORT TERM GOAL #2   Title Pt will demonstrate R knee flexion AROM of 100 degrees of better in order to progress to normal gait pattern.    Baseline R knee flexion AROM = 55 degrees.    Time 4    Period Weeks    Status New    Target Date 03/15/21      PT SHORT TERM GOAL #3   Title Pt will be able to achieve 10 active SLR's in order to demonstrate improved functional quadriceps strength.    Baseline Pt performed 20 SLR on R    Time 4    Period Weeks    Status Achieved    Target Date 03/15/21               PT Long Term Goals - 02/15/21 1115       PT LONG TERM GOAL #1   Title Pt will demonstrate 135 degrees of R knee flexion AROM with 2/10 pain or less in order to return to functional activities such as grocery shopping without limitation.    Baseline 55 degrees with 10/10 pain    Time 8    Period Weeks    Status New    Target Date 04/12/21      PT LONG TERM GOAL #2   Title Pt will achieve 0 degrees of R knee extension AROM with 2/10 pain or less in order to promote normal gait pattern with heel strike.    Baseline 15 degrees shy of full extension with 10/10 pain.    Time 8    Period Weeks    Status New    Target Date 04/12/21      PT LONG TERM GOAL #3   Title Pt will demonstrate R knee extension and flexion MMT of 4+/5 or greater in order to progress LE strengthening regimen without limitation.    Baseline R knee flexion and extension MMT =2/5    Time  8    Period Weeks    Status New    Target Date 04/12/21      PT LONG TERM GOAL #4   Title Pt will report maximum daily pain of 3/10 or less in order to be more functionally independent without limitation due to pain.    Baseline 10/10 pain throughout the day.     Time 8    Period Weeks    Status New    Target Date 04/12/21                   Plan - 03/25/21 1302     Clinical Impression Statement Pt is 6 weeks s/p R TKA on 02/11/2021. She continues to be behind on achieving desired knee flexion ROM. This was a point of emphasis in pt education today. She continues to exhibit R knee swelling, although it is visually improved compared to last visit. She experienced pain during all interventions today, but her pain returned to baseline following cryotherapy. She will continue to benefit from skilled PT to address her primary impairments and return to her prior level of function without limitation.    Personal Factors and Comorbidities Comorbidity 3+    Comorbidities See medical Hx    Examination-Activity Limitations Bathing;Dressing;Sit;Bend;Squat;Stairs;Locomotion Level;Stand    Examination-Participation Restrictions Driving;Cleaning;Laundry;Yard Work    Stability/Clinical Decision Making Stable/Uncomplicated    Designer, jewellery Low    Rehab Potential Good    PT Frequency 2x / week    PT Duration 8 weeks    PT Treatment/Interventions ADLs/Self Care Home Management;Aquatic Therapy;Biofeedback;Moist Heat;Electrical Stimulation;Cryotherapy;DME Instruction;Therapeutic activities;Stair training;Gait training;Functional mobility training;Therapeutic exercise;Balance training;Neuromuscular re-education;Scar mobilization;Compression bandaging;Patient/family education;Manual techniques;Passive range of motion;Joint Manipulations    PT Next Visit Plan continue R knee ROM/ LE strengthening exercises, IntroduceTib-fem and patellar joint mobilization, continue to utilize [facility] TKR protocol    PT [place] and Agree with Plan of Care Patient             Patient will benefit from skilled therapeutic intervention in order to improve the following deficits and impairments:  Abnormal gait,  Decreased balance, Decreased endurance, Decreased mobility, Difficulty walking, Hypomobility, Increased edema, Decreased range of motion, Decreased activity tolerance, Decreased strength, Pain, Impaired flexibility  Visit Diagnosis: Chronic pain of right knee  General weakness  Difficulty in walking, not elsewhere classified  Stiffness of right knee, not elsewhere classified     Problem List Patient Active Problem List   Diagnosis Date Noted   Sleep disturbance 03/19/2021   Blurred vision 03/19/2021   Primary osteoarthritis of right knee 02/11/2021   Status post total right knee replacement 02/11/2021   Chronic low back pain with sciatica 09/18/2020   Encounter for health maintenance examination in adult 01/09/2020   Radicular pain of right lower extremity 04/18/2019   Other intervertebral disc degeneration, lumbar region 04/18/2019   Radiculopathy of lumbosacral region 03/21/2019   Right leg pain 03/21/2019   Chest pain 09/23/2018   Neuropathy involving both lower extremities 09/23/2018   Screen for colon cancer 05/17/2018   Vaccine counseling 05/17/2018   Fatty liver 05/17/2018   Screening for cervical cancer 05/17/2018   Hypothyroidism 05/17/2018   Need for pneumococcal vaccination 05/17/2018   Generalized abdominal tenderness 05/17/2018   Need for influenza vaccination 04/13/2018   Other fatigue 02/08/2018   Edema 01/25/2018   Renal failure (ARF), acute on chronic (HCC) 01/20/2018   Normocytic normochromic anemia 01/20/2018   Tremor 01/05/2018  Vitamin D deficiency 01/05/2018   RUQ abdominal pain 10/27/2017   Hemorrhoids 10/27/2017   High risk medication use 09/25/2017   Type 2 diabetes mellitus with neurological complications ([place]) [id number]   Proteinuria 09/02/2017   Paresthesia 09/02/2017   IgA nephropathy 06/10/2017   Language barrier 06/10/2017   Gastroesophageal reflux disease 06/10/2017   SOB (shortness of breath) 02/24/2017   Hyperlipidemia  04/21/2016   Chronic right-sided low back pain with right-sided sciatica 01/29/2016   Morbid obesity ([place]) 01/29/2016   Hypokalemia 01/29/2016   Essential hypertension 01/29/2016    Vanessa Crellin, PT, DPT 03/25/21 1:08 PM   [facility] [address] [place], [name], [address] Phone: [phone]   Fax:  [phone]  Name: Linda Black MRN: [id number] Date of Birth: 24-Sep-1970

## 2021-03-25 NOTE — Patient Instructions (Signed)
Pt instructed to continue to perform her HEP, with a greater emphasis on R knee flexion ROM.

## 2021-03-26 ENCOUNTER — Ambulatory Visit (INDEPENDENT_AMBULATORY_CARE_PROVIDER_SITE_OTHER): Payer: Medicaid Other | Admitting: Orthopaedic Surgery

## 2021-03-26 ENCOUNTER — Encounter: Payer: Self-pay | Admitting: Orthopaedic Surgery

## 2021-03-26 ENCOUNTER — Ambulatory Visit (INDEPENDENT_AMBULATORY_CARE_PROVIDER_SITE_OTHER): Payer: Medicaid Other

## 2021-03-26 DIAGNOSIS — Z96651 Presence of right artificial knee joint: Secondary | ICD-10-CM | POA: Diagnosis not present

## 2021-03-26 MED ORDER — TRAMADOL HCL 50 MG PO TABS: 50.0000 mg | ORAL_TABLET | Freq: Two times a day (BID) | ORAL | 0 refills | Status: DC | PRN

## 2021-03-26 NOTE — Progress Notes (Signed)
Post-Op Visit Note   Patient: Linda Black           Date of Birth: 1971/06/17           MRN: [id number] Visit Date: 03/26/2021 PCP: Carlena Hurl, PA-C   Assessment & Plan:  Chief Complaint:  Chief Complaint  Patient presents with   Right Knee - Pain, Routine Post Op   Visit Diagnoses:  1. Status post total right knee replacement     Plan: Patient returns today 6 weeks status post right total knee replacement.  Overall she does feel improvement.  She has more the burning tingling and swelling.  Requesting some tramadol for the pain.  Doing physical therapy at our office once a week.  Right knee scars fully healed.  Range of motion 0 to 100 degrees.  Stable to varus valgus.  Mild swelling.  X-rays unremarkable.  Patient is doing well overall and she is seems to be happy with her progress so far.  Tramadol refilled today.  She will continue with outpatient physical therapy at our office.  Dental prophylaxis reinforced.  Recheck in 6 weeks.  Follow-Up Instructions: Return in about 6 weeks (around 05/07/2021).   Orders:  Orders Placed This Encounter  Procedures   XR Knee 1-2 Views Right   Meds ordered this encounter  Medications   traMADol (ULTRAM) 50 MG tablet    Sig: Take 1-2 tablets (50-100 mg total) by mouth 2 (two) times daily as needed.    Dispense:  40 tablet    Refill:  0    Imaging: XR Knee 1-2 Views Right  Result Date: 03/26/2021 Stable total knee replacement in good alignment    PMFS History: Patient Active Problem List   Diagnosis Date Noted   Sleep disturbance 03/19/2021   Blurred vision 03/19/2021   Primary osteoarthritis of right knee 02/11/2021   Status post total right knee replacement 02/11/2021   Chronic low back pain with sciatica 09/18/2020   Encounter for health maintenance examination in adult 01/09/2020   Radicular pain of right lower extremity 04/18/2019   Other intervertebral disc degeneration, lumbar region 04/18/2019    Radiculopathy of lumbosacral region 03/21/2019   Right leg pain 03/21/2019   Chest pain 09/23/2018   Neuropathy involving both lower extremities 09/23/2018   Screen for colon cancer 05/17/2018   Vaccine counseling 05/17/2018   Fatty liver 05/17/2018   Screening for cervical cancer 05/17/2018   Hypothyroidism 05/17/2018   Need for pneumococcal vaccination 05/17/2018   Generalized abdominal tenderness 05/17/2018   Need for influenza vaccination 04/13/2018   Other fatigue 02/08/2018   Edema 01/25/2018   Renal failure (ARF), acute on chronic (HCC) 01/20/2018   Normocytic normochromic anemia 01/20/2018   Tremor 01/05/2018   Vitamin D deficiency 01/05/2018   RUQ abdominal pain 10/27/2017   Hemorrhoids 10/27/2017   High risk medication use 09/25/2017   Type 2 diabetes mellitus with neurological complications ([place]) [id number]   Proteinuria 09/02/2017   Paresthesia 09/02/2017   IgA nephropathy 06/10/2017   Language barrier 06/10/2017   Gastroesophageal reflux disease 06/10/2017   SOB (shortness of breath) 02/24/2017   Hyperlipidemia 04/21/2016   Chronic right-sided low back pain with right-sided sciatica 01/29/2016   Morbid obesity ([place]) 01/29/2016   Hypokalemia 01/29/2016   Essential hypertension 01/29/2016   Past Medical History:  Diagnosis Date   Diabetes mellitus without complication ([place])    Dr. Kelton Pillar   Fatty liver 2019   elevated LFTs, negative for Hep B and Hep  C 04/2018   GERD (gastroesophageal reflux disease)    Hepatitis    Hyperlipidemia    Hypertension    Hypothyroidism 2019   IgA nephropathy 2019   per biopsy, Dr. Corliss Parish   Language barrier    Lumbar stenosis    Dr. Louanne Skye   Obesity    Osteoarthritis    Proteinuria     Family History  Problem Relation Age of Onset   Diabetes Mother    Hypertension Father    Colon cancer Neg Hx    Stomach cancer Neg Hx    Esophageal cancer Neg Hx    Rectal cancer Neg Hx    Liver cancer Neg Hx     Past  Surgical History:  Procedure Laterality Date   RENAL BIOPSY  2018   IgA nephropathy, Dr. Corliss Parish   TOTAL KNEE ARTHROPLASTY Right 02/11/2021   Procedure: RIGHT TOTAL KNEE ARTHROPLASTY;  Surgeon: Leandrew Koyanagi, MD;  Location: [place];  Service: Orthopedics;  Laterality: Right;   Social History   Occupational History   Occupation: homemaker  Tobacco Use   Smoking status: Never   Smokeless tobacco: Never  Vaping Use   Vaping Use: Never used  Substance and Sexual Activity   Alcohol use: Never   Drug use: No   Sexual activity: Not on file

## 2021-04-02 ENCOUNTER — Other Ambulatory Visit: Payer: Self-pay

## 2021-04-02 ENCOUNTER — Ambulatory Visit: Payer: Medicaid Other

## 2021-04-02 DIAGNOSIS — M25561 Pain in right knee: Secondary | ICD-10-CM | POA: Diagnosis not present

## 2021-04-02 DIAGNOSIS — G8929 Other chronic pain: Secondary | ICD-10-CM

## 2021-04-02 DIAGNOSIS — M25661 Stiffness of right knee, not elsewhere classified: Secondary | ICD-10-CM

## 2021-04-02 DIAGNOSIS — R262 Difficulty in walking, not elsewhere classified: Secondary | ICD-10-CM

## 2021-04-02 DIAGNOSIS — R531 Weakness: Secondary | ICD-10-CM

## 2021-04-02 NOTE — Therapy (Signed)
[place], [name], [address] Phone: [phone]   Fax:  [phone]  Physical Therapy Treatment  Patient Details  Name: Linda Black MRN: [id number] Date of Birth: 20-Oct-1970 Referring Provider (PT): Carlena Hurl   Encounter Date: 04/02/2021   PT End of Session - 04/02/21 1303     Visit Number 6    Number of Visits 17    Date for PT Re-Evaluation 04/12/21    Authorization Type [employer] MCD, Request additional auth at last authorized visit, 27 annual visits    PT Start Time 1220   Pt arrived 5 minutes late to appointment   PT Stop Time 1310   10 minutes of cryotherapy at end of session.   PT Time Calculation (min) 50 min    Activity Tolerance Patient limited by pain;Patient tolerated treatment well    Behavior During Therapy [facility] for tasks assessed/performed             Past Medical History:  Diagnosis Date   Diabetes mellitus without complication ([place])    Dr. Kelton Pillar   Fatty liver 2019   elevated LFTs, negative for Hep B and Hep C 04/2018   GERD (gastroesophageal reflux disease)    Hepatitis    Hyperlipidemia    Hypertension    Hypothyroidism 2019   IgA nephropathy 2019   per biopsy, Dr. Corliss Parish   Language barrier    Lumbar stenosis    Dr. Louanne Skye   Obesity    Osteoarthritis    Proteinuria     Past Surgical History:  Procedure Laterality Date   RENAL BIOPSY  2018   IgA nephropathy, Dr. Corliss Parish   TOTAL KNEE ARTHROPLASTY Right 02/11/2021   Procedure: RIGHT TOTAL KNEE ARTHROPLASTY;  Surgeon: Leandrew Koyanagi, MD;  Location: [facility];  Service: Orthopedics;  Laterality: Right;    There were no vitals filed for this visit.   Subjective Assessment - 04/02/21 1221     Subjective Pt reports continued severe pain today, which she reports was exacerbated yesterday after standing out in her garden for too long. She reports still having pain in her popliteal fossa when  flexing her knee. She adds that her last orthopedic appointment was a week ago and that her doctor told her that she is progressing well, although her recovery may take a long periof of time.    Patient is accompained by: Interpreter   Y'Keo   Currently in Pain? Yes    Pain Score 10-Worst pain ever    Pain Location Knee    Pain Orientation Right    Pain Descriptors / Indicators Aching;Constant    Pain Type Surgical pain;Chronic pain                [facility] PT Assessment - 04/02/21 0001       Observation/Other Assessments   Observations Surgical scar is healing well, no signs of infection, improved swelling since last visit, pitting edema noted at R lateral knee      AROM   Right Knee Flexion 75   Painful     PROM   Right Knee Flexion 83   AAROM with green strap                          [facility] Adult PT Treatment/Exercise - 04/02/21 0001       Knee/Hip Exercises: Standing   Lateral Step Up Step Height: 6"   2x10  Forward Step Up Step Height: 6"   2x10   Wall Squat Other (comment)   Quarter wall sit 3x30sec     Knee/Hip Exercises: Seated   Long Arc Quad Strengthening   Alternating LAQ and knee flexion 2x10 each     Modalities   Modalities Cryotherapy      Cryotherapy   Number Minutes Cryotherapy 10 Minutes    Cryotherapy Location Knee    Type of Cryotherapy Ice pack      Manual Therapy   Manual Therapy Edema management;Joint mobilization    Edema Management proximal edema massage to R calf/ thigh    Joint Mobilization Grade 3-4 AP/PA tib-fem mobilizations                    PT Education - 04/02/21 1303     Education Details Pt educated on physiological effect of weight-bearing on knee joint.    Person(s) Educated Patient    Methods Explanation;Demonstration;Tactile cues;Verbal cues    Comprehension Verbal cues required;Tactile cues required;Returned demonstration;Verbalized understanding              PT Short Term Goals -  02/20/21 1131       PT SHORT TERM GOAL #1   Title Pt will demonstrate understanding and adherence of HEP in order to promote independence in the management of her impairments.    Baseline HEP given at eval.    Time 4    Period Weeks    Status New    Target Date 03/15/21      PT SHORT TERM GOAL #2   Title Pt will demonstrate R knee flexion AROM of 100 degrees of better in order to progress to normal gait pattern.    Baseline R knee flexion AROM = 55 degrees.    Time 4    Period Weeks    Status New    Target Date 03/15/21      PT SHORT TERM GOAL #3   Title Pt will be able to achieve 10 active SLR's in order to demonstrate improved functional quadriceps strength.    Baseline Pt performed 20 SLR on R    Time 4    Period Weeks    Status Achieved    Target Date 03/15/21               PT Long Term Goals - 04/02/21 1306       PT LONG TERM GOAL #1   Title Pt will demonstrate 135 degrees of R knee flexion AROM with 2/10 pain or less in order to return to functional activities such as grocery shopping without limitation.    Baseline 75d with 10/10 pain on 04/02/2021; 55 degrees with 10/10 pain at eval    Time 8    Period Weeks    Status On-going      PT LONG TERM GOAL #2   Title Pt will achieve 0 degrees of R knee extension AROM with 2/10 pain or less in order to promote normal gait pattern with heel strike.    Baseline 5d shy of full extension on 03/25/2021; 15 degrees shy of full extension with 10/10 pain at eval.    Time 8    Period Weeks    Status On-going      PT LONG TERM GOAL #3   Title Pt will demonstrate R knee extension and flexion MMT of 4+/5 or greater in order to progress LE strengthening regimen without limitation.    Baseline R knee flexion  and extension MMT =2/5    Time 8    Period Weeks    Status On-going      PT LONG TERM GOAL #4   Title Pt will report maximum daily pain of 3/10 or less in order to be more functionally independent without limitation due  to pain.    Baseline 10/10 pain throughout the day.    Time 8    Period Weeks    Status On-going                   Plan - 04/02/21 1304     Clinical Impression Statement Pt is 7 weeks 1 day s/p R TKA on 02/11/2021. She continues to be behind on achieving desired knee flexion ROM. This was a point of emphasis in pt education today. She continues to exhibit R knee swelling, although it is visually improved compared to last visit and is further improved following edema massage, ice, and elevation. She demonstrates visually improved wall squat depth and mechanics with lateral and forward step-up's. Additionally, tib-fem mobilizations were therapeutic for the pt. She will continue to benefit from skilled PT to address her primary impairments and return to her prior level of function without limitation.    Personal Factors and Comorbidities Comorbidity 3+    Comorbidities See medical Hx    Examination-Activity Limitations Bathing;Dressing;Sit;Bend;Squat;Stairs;Locomotion Level;Stand    Examination-Participation Restrictions Driving;Cleaning;Laundry;Yard Work    Stability/Clinical Decision Making Stable/Uncomplicated    Designer, jewellery Low    Rehab Potential Good    PT Frequency 2x / week    PT Duration 8 weeks    PT Treatment/Interventions ADLs/Self Care Home Management;Aquatic Therapy;Biofeedback;Moist Heat;Electrical Stimulation;Cryotherapy;DME Instruction;Therapeutic activities;Stair training;Gait training;Functional mobility training;Therapeutic exercise;Balance training;Neuromuscular re-education;Scar mobilization;Compression bandaging;Patient/family education;Manual techniques;Passive range of motion;Joint Manipulations    PT Next Visit Plan continue R knee ROM/ LE strengthening exercises, continue to utilize [facility] TKR protocol    PT Home Exercise Plan [id number]    Consulted and Agree with Plan of Care Patient             Patient will  benefit from skilled therapeutic intervention in order to improve the following deficits and impairments:  Abnormal gait, Decreased balance, Decreased endurance, Decreased mobility, Difficulty walking, Hypomobility, Increased edema, Decreased range of motion, Decreased activity tolerance, Decreased strength, Pain, Impaired flexibility  Visit Diagnosis: Chronic pain of right knee  General weakness  Difficulty in walking, not elsewhere classified  Stiffness of right knee, not elsewhere classified     Problem List Patient Active Problem List   Diagnosis Date Noted   Sleep disturbance 03/19/2021   Blurred vision 03/19/2021   Primary osteoarthritis of right knee 02/11/2021   Status post total right knee replacement 02/11/2021   Chronic low back pain with sciatica 09/18/2020   Encounter for health maintenance examination in adult 01/09/2020   Radicular pain of right lower extremity 04/18/2019   Other intervertebral disc degeneration, lumbar region 04/18/2019   Radiculopathy of lumbosacral region 03/21/2019   Right leg pain 03/21/2019   Chest pain 09/23/2018   Neuropathy involving both lower extremities 09/23/2018   Screen for colon cancer 05/17/2018   Vaccine counseling 05/17/2018   Fatty liver 05/17/2018   Screening for cervical cancer 05/17/2018   Hypothyroidism 05/17/2018   Need for pneumococcal vaccination 05/17/2018   Generalized abdominal tenderness 05/17/2018   Need for influenza vaccination 04/13/2018   Other fatigue 02/08/2018   Edema 01/25/2018   Renal failure (ARF), acute on chronic (HCC) 01/20/2018  Normocytic normochromic anemia 01/20/2018   Tremor 01/05/2018   Vitamin D deficiency 01/05/2018   RUQ abdominal pain 10/27/2017   Hemorrhoids 10/27/2017   High risk medication use 09/25/2017   Type 2 diabetes mellitus with neurological complications ([place]) [id number]   Proteinuria 09/02/2017   Paresthesia 09/02/2017   IgA nephropathy 06/10/2017   Language barrier  06/10/2017   Gastroesophageal reflux disease 06/10/2017   SOB (shortness of breath) 02/24/2017   Hyperlipidemia 04/21/2016   Chronic right-sided low back pain with right-sided sciatica 01/29/2016   Morbid obesity ([place]) 01/29/2016   Hypokalemia 01/29/2016   Essential hypertension 01/29/2016    Vanessa [place]ont, PT, DPT 04/02/21 1:08 PM   [facility] [address] [place], [name], [address] Phone: [phone]   Fax:  [phone]  Name: Linda Black MRN: [id number] Date of Birth: 09/29/1970

## 2021-04-02 NOTE — Patient Instructions (Signed)
Stressed to pt the importance of regular knee flexion AAROM.

## 2021-04-03 ENCOUNTER — Ambulatory Visit: Payer: Medicaid Other

## 2021-04-09 ENCOUNTER — Ambulatory Visit: Payer: Medicaid Other

## 2021-04-15 ENCOUNTER — Other Ambulatory Visit: Payer: Self-pay | Admitting: Medical

## 2021-04-16 ENCOUNTER — Ambulatory Visit: Payer: Medicaid Other | Attending: Medical

## 2021-04-16 ENCOUNTER — Other Ambulatory Visit: Payer: Self-pay

## 2021-04-16 DIAGNOSIS — R531 Weakness: Secondary | ICD-10-CM | POA: Diagnosis present

## 2021-04-16 DIAGNOSIS — M25561 Pain in right knee: Secondary | ICD-10-CM | POA: Insufficient documentation

## 2021-04-16 DIAGNOSIS — R262 Difficulty in walking, not elsewhere classified: Secondary | ICD-10-CM | POA: Insufficient documentation

## 2021-04-16 DIAGNOSIS — G8929 Other chronic pain: Secondary | ICD-10-CM | POA: Diagnosis present

## 2021-04-16 DIAGNOSIS — M25661 Stiffness of right knee, not elsewhere classified: Secondary | ICD-10-CM | POA: Diagnosis present

## 2021-04-16 NOTE — Therapy (Signed)
[place], [name], [address] Phone: [phone]   Fax:  [phone]  Physical Therapy Treatment  Progress Note Reporting Period 02/15/2021 to 04/16/2021   See note below for Objective Data and Assessment of Progress/Goals.      Patient Details  Name: Linda Black MRN: [id number] Date of Birth: 12/08/1970 Referring Provider (PT): Carlena Hurl   Encounter Date: 04/16/2021   PT End of Session - 04/16/21 1305     Visit Number 7    Number of Visits 17    Date for PT Re-Evaluation 05/28/21    Authorization Type [employer] MCD, Request additional auth at last authorized visit, 27 annual visits    PT Start Time 1215    PT Stop Time 1300    PT Time Calculation (min) 45 min    Equipment Utilized During Treatment Gait belt    Activity Tolerance Patient limited by pain;Patient tolerated treatment well    Behavior During Therapy [facility] for tasks assessed/performed             Past Medical History:  Diagnosis Date   Diabetes mellitus without complication ([place])    Dr. Kelton Pillar   Fatty liver 2019   elevated LFTs, negative for Hep B and Hep C 04/2018   GERD (gastroesophageal reflux disease)    Hepatitis    Hyperlipidemia    Hypertension    Hypothyroidism 2019   IgA nephropathy 2019   per biopsy, Dr. Corliss Parish   Language barrier    Lumbar stenosis    Dr. Louanne Skye   Obesity    Osteoarthritis    Proteinuria     Past Surgical History:  Procedure Laterality Date   RENAL BIOPSY  2018   IgA nephropathy, Dr. Corliss Parish   TOTAL KNEE ARTHROPLASTY Right 02/11/2021   Procedure: RIGHT TOTAL KNEE ARTHROPLASTY;  Surgeon: Leandrew Koyanagi, MD;  Location: [facility];  Service: Orthopedics;  Laterality: Right;    There were no vitals filed for this visit.   Subjective Assessment - 04/16/21 1209     Subjective Pt reports doing better since starting PT, although she reports that she still has good days and  bad days. She reports doing her HEP daily.    Patient is accompained by: Interpreter   Y[employer]keo   Limitations Sitting;Standing;Walking;House hold activities    How long can you sit comfortably? Unlimited    How long can you stand comfortably? 1 hour    How long can you walk comfortably? 3 minutes before fatigue    Patient Stated Goals Return to walking, sitting, household chores    Currently in Pain? Yes    Pain Score 3     Pain Location Knee    Pain Orientation Right    Pain Descriptors / Indicators Tightness    Pain Type Chronic pain;Surgical pain    Pain Onset More than a month ago    Pain Frequency Intermittent                [facility] PT Assessment - 04/16/21 0001       AROM   Right Knee Extension 4    Right Knee Flexion 101      PROM   Right Knee Extension 1    Right Knee Flexion 101   limited by pain     Strength   Right Knee Flexion 5/5    Right Knee Extension 5/5  [facility] Adult PT Treatment/Exercise - 04/16/21 0001       Knee/Hip Exercises: Standing   Lateral Step Up Step Height: 6";Other (comment)   3x10 BIL with knee drive in // bars   SLS 3x30sec BIL on Airex pad in // bars    Rebounder 3x10 SLS on Airex pad with 1kg ball    Other Standing Knee Exercises Marching with high knees on Airex pad in // bars 3x20      Knee/Hip Exercises: Seated   Long Probation officer;Other (comment)   Eccentric (2 up, 1 down) with 10# cable 2x10                    PT Education - 04/16/21 1301     Education Details Updated HEP. Pt also educated on significance of progress made thus far in PT, as well as POC moving forward.    Person(s) Educated Patient    Methods Explanation;Demonstration;Handout    Comprehension Verbalized understanding;Returned demonstration              PT Short Term Goals - 04/16/21 1226       PT SHORT TERM GOAL #1   Title Pt will demonstrate understanding and adherence of HEP in order to  promote independence in the management of her impairments.    Baseline Pt reports daily adherence to her HEP.    Time 4    Period Weeks    Status Achieved    Target Date 03/15/21      PT SHORT TERM GOAL #2   Title Pt will demonstrate R knee flexion AROM of 100 degrees of better in order to progress to normal gait pattern.    Baseline 101 degrees (04/16/2021)    Time 4    Period Weeks    Status Achieved    Target Date 03/15/21      PT SHORT TERM GOAL #3   Title Pt will be able to achieve 10 active SLR's in order to demonstrate improved functional quadriceps strength.    Baseline Pt performed 20 SLR on R    Time 4    Period Weeks    Status Achieved    Target Date 03/15/21               PT Long Term Goals - 04/16/21 1227       PT LONG TERM GOAL #1   Title Pt will demonstrate 135 degrees of R knee flexion AROM with 2/10 pain or less in order to return to functional activities such as grocery shopping without limitation.    Baseline 101d on 04/16/2021; 75d with 10/10 pain on 04/02/2021; 55 degrees with 10/10 pain at eval    Time 8    Period Weeks    Status On-going      PT LONG TERM GOAL #2   Title Pt will achieve 0 degrees of R knee extension AROM with 2/10 pain or less in order to promote normal gait pattern with heel strike.    Baseline 5d shy of full extension on 04/16/2021; 5d shy of full extension on 03/25/2021; 15 degrees shy of full extension with 10/10 pain at eval.    Time 8    Period Weeks    Status On-going      PT LONG TERM GOAL #3   Title Pt will demonstrate R knee extension and flexion MMT of 4+/5 or greater in order to progress LE strengthening regimen without limitation.    Baseline R knee flexion  and extension MMT =5/5 on 04/16/2021    Time 8    Period Weeks    Status Achieved      PT LONG TERM GOAL #4   Title Pt will report maximum daily pain of 3/10 or less in order to be more functionally independent without limitation due to pain.    Baseline 5/10  associated with increased activity    Time 8    Period Weeks    Status On-going                   Plan - 04/16/21 1308     Clinical Impression Statement Pt is 9 weeks 1 day s/p R TKA on 02/11/2021. Upon re-assessment of objective measures, the pt demonstrates significant improvement in R knee AROM, pain, and functional ability. She also demonstrates good functional balance with selected balance exercises today. She still has room for improvement in pain, knee ROM, and functional ability with walking. She will continue to benefit from skilled PT to address her remaining impairments and return to her prior level of function without limitation.    Personal Factors and Comorbidities Comorbidity 3+    Comorbidities See medical Hx    Examination-Activity Limitations Bathing;Dressing;Sit;Bend;Squat;Stairs;Locomotion Level;Stand    Examination-Participation Restrictions Driving;Cleaning;Laundry;Yard Work    Stability/Clinical Decision Making Stable/Uncomplicated    Designer, jewellery Low    Rehab Potential Good    PT Frequency 1x / week    PT Duration 6 weeks    PT Treatment/Interventions ADLs/Self Care Home Management;Aquatic Therapy;Biofeedback;Moist Heat;Electrical Stimulation;Cryotherapy;DME Instruction;Therapeutic activities;Stair training;Gait training;Functional mobility training;Therapeutic exercise;Balance training;Neuromuscular re-education;Scar mobilization;Compression bandaging;Patient/family education;Manual techniques;Passive range of motion;Joint Manipulations    PT Next Visit Plan continue R knee ROM/ LE strengthening exercises, continue to utilize [facility] TKR protocol    PT Home Exercise Plan [id number]    Consulted and Agree with Plan of Care Patient             Patient will benefit from skilled therapeutic intervention in order to improve the following deficits and impairments:  Abnormal gait, Decreased balance, Decreased endurance,  Decreased mobility, Difficulty walking, Hypomobility, Increased edema, Decreased range of motion, Decreased activity tolerance, Decreased strength, Pain, Impaired flexibility  Visit Diagnosis: Chronic pain of right knee  General weakness  Difficulty in walking, not elsewhere classified  Stiffness of right knee, not elsewhere classified     Problem List Patient Active Problem List   Diagnosis Date Noted   Sleep disturbance 03/19/2021   Blurred vision 03/19/2021   Primary osteoarthritis of right knee 02/11/2021   Status post total right knee replacement 02/11/2021   Chronic low back pain with sciatica 09/18/2020   Encounter for health maintenance examination in adult 01/09/2020   Radicular pain of right lower extremity 04/18/2019   Other intervertebral disc degeneration, lumbar region 04/18/2019   Radiculopathy of lumbosacral region 03/21/2019   Right leg pain 03/21/2019   Chest pain 09/23/2018   Neuropathy involving both lower extremities 09/23/2018   Screen for colon cancer 05/17/2018   Vaccine counseling 05/17/2018   Fatty liver 05/17/2018   Screening for cervical cancer 05/17/2018   Hypothyroidism 05/17/2018   Need for pneumococcal vaccination 05/17/2018   Generalized abdominal tenderness 05/17/2018   Need for influenza vaccination 04/13/2018   Other fatigue 02/08/2018   Edema 01/25/2018   Renal failure (ARF), acute on chronic (HCC) 01/20/2018   Normocytic normochromic anemia 01/20/2018   Tremor 01/05/2018   Vitamin D deficiency 01/05/2018   RUQ abdominal pain 10/27/2017  Hemorrhoids 10/27/2017   High risk medication use 09/25/2017   Type 2 diabetes mellitus with neurological complications ([place]) [id number]   Proteinuria 09/02/2017   Paresthesia 09/02/2017   IgA nephropathy 06/10/2017   Language barrier 06/10/2017   Gastroesophageal reflux disease 06/10/2017   SOB (shortness of breath) 02/24/2017   Hyperlipidemia 04/21/2016   Chronic right-sided low back pain  with right-sided sciatica 01/29/2016   Morbid obesity ([place]) 01/29/2016   Hypokalemia 01/29/2016   Essential hypertension 01/29/2016    Vanessa [place], PT, DPT 04/16/21 1:12 PM   [facility] [address] [place], [name], [address] Phone: [phone]   Fax:  [phone]  Name: Linda Black MRN: [id number] Date of Birth: [birth year]

## 2021-04-16 NOTE — Patient Instructions (Signed)
  [id number]

## 2021-04-22 ENCOUNTER — Other Ambulatory Visit: Payer: Self-pay | Admitting: Orthopaedic Surgery

## 2021-04-22 ENCOUNTER — Other Ambulatory Visit: Payer: Self-pay | Admitting: Medical

## 2021-04-23 ENCOUNTER — Other Ambulatory Visit: Payer: Self-pay | Admitting: Physician Assistant

## 2021-04-23 ENCOUNTER — Other Ambulatory Visit: Payer: Self-pay

## 2021-04-23 ENCOUNTER — Ambulatory Visit: Payer: Medicaid Other

## 2021-04-23 DIAGNOSIS — R262 Difficulty in walking, not elsewhere classified: Secondary | ICD-10-CM

## 2021-04-23 DIAGNOSIS — G8929 Other chronic pain: Secondary | ICD-10-CM

## 2021-04-23 DIAGNOSIS — M25661 Stiffness of right knee, not elsewhere classified: Secondary | ICD-10-CM

## 2021-04-23 DIAGNOSIS — R531 Weakness: Secondary | ICD-10-CM

## 2021-04-23 DIAGNOSIS — M25561 Pain in right knee: Secondary | ICD-10-CM | POA: Diagnosis not present

## 2021-04-23 MED ORDER — TRAMADOL HCL 50 MG PO TABS: 50.0000 mg | ORAL_TABLET | Freq: Two times a day (BID) | ORAL | 0 refills | Status: DC | PRN

## 2021-04-23 NOTE — Therapy (Signed)
[place], [name], [address] Phone: [phone]   Fax:  [phone]  Physical Therapy Treatment  Patient Details  Name: Linda Black MRN: [id number] Date of Birth: 07/01/1971 Referring Provider (PT): Carlena Hurl   Encounter Date: 04/23/2021   PT End of Session - 04/23/21 1232     Visit Number 8    Number of Visits 17    Date for PT Re-Evaluation 05/28/21    Authorization Type UHC MCD, Request additional auth at last authorized visit, 37 annual visits    PT Start Time 1232    PT Stop Time 1311    PT Time Calculation (min) 39 min    Activity Tolerance Patient tolerated treatment well    Behavior During Therapy [facility] for tasks assessed/performed             Past Medical History:  Diagnosis Date   Diabetes mellitus without complication ([place])    Dr. Kelton Pillar   Fatty liver 2019   elevated LFTs, negative for Hep B and Hep C 04/2018   GERD (gastroesophageal reflux disease)    Hepatitis    Hyperlipidemia    Hypertension    Hypothyroidism 2019   IgA nephropathy 2019   per biopsy, Dr. Corliss Parish   Language barrier    Lumbar stenosis    Dr. Louanne Skye   Obesity    Osteoarthritis    Proteinuria     Past Surgical History:  Procedure Laterality Date   RENAL BIOPSY  2018   IgA nephropathy, Dr. Corliss Parish   TOTAL KNEE ARTHROPLASTY Right 02/11/2021   Procedure: RIGHT TOTAL KNEE ARTHROPLASTY;  Surgeon: Leandrew Koyanagi, MD;  Location: [place];  Service: Orthopedics;  Laterality: Right;    There were no vitals filed for this visit.   Subjective Assessment - 04/23/21 1237     Subjective Patient reports the knee is hurting a little bit.    Patient is accompained by: Family member   dauther interprets; reporting that interpreter left as daughter was capable of interpreting   Currently in Pain? Yes    Pain Score 3     Pain Location Knee    Pain Orientation Right    Pain Descriptors /  Indicators Tightness    Pain Type Chronic pain    Pain Onset More than a month ago    Pain Frequency Intermittent                               [facility] Adult PT Treatment/Exercise - 04/23/21 0001       Knee/Hip Exercises: Stretches   Passive Hamstring Stretch 30 seconds    Passive Hamstring Stretch Limitations x2; RLE    Sports administrator 30 seconds;2 reps    Quad Stretch Limitations RLE prone with strap      Knee/Hip Exercises: Aerobic   Stationary Bike rocking x 3 minutes; fwd revolution x 2 minutes; no resistance      Knee/Hip Exercises: Standing   Heel Raises 15 reps    Heel Raises Limitations x2    Terminal Knee Extension 10 reps    Theraband Level (Terminal Knee Extension) Level 4 (Blue)    Terminal Knee Extension Limitations x2; RLE    Functional Squat 10 reps;2 sets    Functional Squat Limitations with UE support    Other Standing Knee Exercises marching on airex pad 2 x 10  Knee/Hip Exercises: Seated   Sit to Sand 10 reps;2 sets   holding ball     Knee/Hip Exercises: Supine   Bridges 10 reps    Bridges Limitations x2                       PT Short Term Goals - 04/16/21 1226       PT SHORT TERM GOAL #1   Title Pt will demonstrate understanding and adherence of HEP in order to promote independence in the management of her impairments.    Baseline Pt reports daily adherence to her HEP.    Time 4    Period Weeks    Status Achieved    Target Date 03/15/21      PT SHORT TERM GOAL #2   Title Pt will demonstrate R knee flexion AROM of 100 degrees of better in order to progress to normal gait pattern.    Baseline 101 degrees (04/16/2021)    Time 4    Period Weeks    Status Achieved    Target Date 03/15/21      PT SHORT TERM GOAL #3   Title Pt will be able to achieve 10 active SLR's in order to demonstrate improved functional quadriceps strength.    Baseline Pt performed 20 SLR on R    Time 4    Period Weeks    Status Achieved     Target Date 03/15/21               PT Long Term Goals - 04/16/21 1227       PT LONG TERM GOAL #1   Title Pt will demonstrate 135 degrees of R knee flexion AROM with 2/10 pain or less in order to return to functional activities such as grocery shopping without limitation.    Baseline 101d on 04/16/2021; 75d with 10/10 pain on 04/02/2021; 55 degrees with 10/10 pain at eval    Time 8    Period Weeks    Status On-going      PT LONG TERM GOAL #2   Title Pt will achieve 0 degrees of R knee extension AROM with 2/10 pain or less in order to promote normal gait pattern with heel strike.    Baseline 5d shy of full extension on 04/16/2021; 5d shy of full extension on 03/25/2021; 15 degrees shy of full extension with 10/10 pain at eval.    Time 8    Period Weeks    Status On-going      PT LONG TERM GOAL #3   Title Pt will demonstrate R knee extension and flexion MMT of 4+/5 or greater in order to progress LE strengthening regimen without limitation.    Baseline R knee flexion and extension MMT =5/5 on 04/16/2021    Time 8    Period Weeks    Status Achieved      PT LONG TERM GOAL #4   Title Pt will report maximum daily pain of 3/10 or less in order to be more functionally independent without limitation due to pain.    Baseline 5/10 associated with increased activity    Time 8    Period Weeks    Status On-going                   Plan - 04/23/21 1238     Clinical Impression Statement Patient was able to complete full revolution on stationary bike today after a few minutes of rocking. Continued with  [facility] LE strengthening with patient demonstrating good form and control with sit to stand. With bodyweight squats she has a left hip shift that she is unable to correct. Intermittent reports of tightness in the posterior knee, though overall she tolerated session well.    Personal Factors and Comorbidities Comorbidity 3+    Comorbidities See medical Hx    Examination-Activity  Limitations Bathing;Dressing;Sit;Bend;Squat;Stairs;Locomotion Level;Stand    Examination-Participation Restrictions Driving;Cleaning;Laundry;Yard Work    Stability/Clinical Decision Making Stable/Uncomplicated    Rehab Potential Good    PT Frequency 1x / week    PT Duration 6 weeks    PT Treatment/Interventions ADLs/Self Care Home Management;Aquatic Therapy;Biofeedback;Moist Heat;Electrical Stimulation;Cryotherapy;DME Instruction;Therapeutic activities;Stair training;Gait training;Functional mobility training;Therapeutic exercise;Balance training;Neuromuscular re-education;Scar mobilization;Compression bandaging;Patient/family education;Manual techniques;Passive range of motion;Joint Manipulations    PT Next Visit Plan continue R knee ROM/ LE strengthening exercises, continue to utilize [facility] TKR protocol    PT [place] and Agree with Plan of Care Patient;Family member/caregiver    Family Member Consulted daughter             Patient will benefit from skilled therapeutic intervention in order to improve the following deficits and impairments:  Abnormal gait, Decreased balance, Decreased endurance, Decreased mobility, Difficulty walking, Hypomobility, Increased edema, Decreased range of motion, Decreased activity tolerance, Decreased strength, Pain, Impaired flexibility  Visit Diagnosis: Chronic pain of right knee  General weakness  Difficulty in walking, not elsewhere classified  Stiffness of right knee, not elsewhere classified     Problem List Patient Active Problem List   Diagnosis Date Noted   Sleep disturbance 03/19/2021   Blurred vision 03/19/2021   Primary osteoarthritis of right knee 02/11/2021   Status post total right knee replacement 02/11/2021   Chronic low back pain with sciatica 09/18/2020   Encounter for health maintenance examination in adult 01/09/2020   Radicular pain of right lower extremity  04/18/2019   Other intervertebral disc degeneration, lumbar region 04/18/2019   Radiculopathy of lumbosacral region 03/21/2019   Right leg pain 03/21/2019   Chest pain 09/23/2018   Neuropathy involving both lower extremities 09/23/2018   Screen for colon cancer 05/17/2018   Vaccine counseling 05/17/2018   Fatty liver 05/17/2018   Screening for cervical cancer 05/17/2018   Hypothyroidism 05/17/2018   Need for pneumococcal vaccination 05/17/2018   Generalized abdominal tenderness 05/17/2018   Need for influenza vaccination 04/13/2018   Other fatigue 02/08/2018   Edema 01/25/2018   Renal failure (ARF), acute on chronic (HCC) 01/20/2018   Normocytic normochromic anemia 01/20/2018   Tremor 01/05/2018   Vitamin D deficiency 01/05/2018   RUQ abdominal pain 10/27/2017   Hemorrhoids 10/27/2017   High risk medication use 09/25/2017   Type 2 diabetes mellitus with neurological complications ([place]) [id number]   Proteinuria 09/02/2017   Paresthesia 09/02/2017   IgA nephropathy 06/10/2017   Language barrier 06/10/2017   Gastroesophageal reflux disease 06/10/2017   SOB (shortness of breath) 02/24/2017   Hyperlipidemia 04/21/2016   Chronic right-sided low back pain with right-sided sciatica 01/29/2016   Morbid obesity ([place]) 01/29/2016   Hypokalemia 01/29/2016   Essential hypertension 01/29/2016   Gwendolyn Grant, PT, DPT, ATC 04/23/21 1:15 PM  [facility] [address] [place], [name], [address] Phone: [phone]   Fax:  [phone]  Name: Linda Black MRN: [id number] Date of Birth: 01/26/1971

## 2021-04-23 NOTE — Telephone Encounter (Signed)
Sent in

## 2021-04-30 ENCOUNTER — Ambulatory Visit: Payer: Medicaid Other

## 2021-04-30 ENCOUNTER — Other Ambulatory Visit: Payer: Self-pay

## 2021-04-30 DIAGNOSIS — R531 Weakness: Secondary | ICD-10-CM

## 2021-04-30 DIAGNOSIS — M25661 Stiffness of right knee, not elsewhere classified: Secondary | ICD-10-CM

## 2021-04-30 DIAGNOSIS — G8929 Other chronic pain: Secondary | ICD-10-CM

## 2021-04-30 DIAGNOSIS — M25561 Pain in right knee: Secondary | ICD-10-CM

## 2021-04-30 DIAGNOSIS — R262 Difficulty in walking, not elsewhere classified: Secondary | ICD-10-CM

## 2021-04-30 NOTE — Therapy (Signed)
[place], [name], [address] Phone: [phone]   Fax:  [phone]  Physical Therapy Treatment  Patient Details  Name: Linda Black MRN: [id number] Date of Birth: [birth year] Referring Provider (PT): Carlena Hurl   Encounter Date: 04/30/2021   PT End of Session - 04/30/21 1246     Visit Number 9    Number of Visits 17    Date for PT Re-Evaluation 05/28/21    Authorization Type UHC MCD, Request additional auth at last authorized visit, 27 annual visits    PT Start Time 1215    PT Stop Time 1300    PT Time Calculation (min) 45 min    Equipment Utilized During Treatment Gait belt    Activity Tolerance Patient tolerated treatment well    Behavior During Therapy [facility] for tasks assessed/performed             Past Medical History:  Diagnosis Date   Diabetes mellitus without complication ([place])    Dr. Kelton Pillar   Fatty liver 2019   elevated LFTs, negative for Hep B and Hep C 04/2018   GERD (gastroesophageal reflux disease)    Hepatitis    Hyperlipidemia    Hypertension    Hypothyroidism 2019   IgA nephropathy 2019   per biopsy, Dr. Corliss Parish   Language barrier    Lumbar stenosis    Dr. Louanne Skye   Obesity    Osteoarthritis    Proteinuria     Past Surgical History:  Procedure Laterality Date   RENAL BIOPSY  2018   IgA nephropathy, Dr. Corliss Parish   TOTAL KNEE ARTHROPLASTY Right 02/11/2021   Procedure: RIGHT TOTAL KNEE ARTHROPLASTY;  Surgeon: Leandrew Koyanagi, MD;  Location: [place];  Service: Orthopedics;  Laterality: Right;    There were no vitals filed for this visit.   Subjective Assessment - 04/30/21 1217     Subjective Pt report improved swelling in her R knee, although after prolonged standing or walking, her knee still swells. She also reports tightness around her knee and reports that the tissue on the lateral aspect of her scar is "hard." She reports continued HEP  adherence.    Patient is accompained by: Interpreter   Y Hin   Limitations Sitting;Standing;Walking;House hold activities    Currently in Pain? No/denies    Pain Score 0-No pain    Pain Location Knee    Pain Orientation Right    Pain Descriptors / Indicators Tightness                               [facility] Adult PT Treatment/Exercise - 04/30/21 0001       Knee/Hip Exercises: Machines for Strengthening   Cybex Knee Extension --    Total Gym Leg Press 3x10 with 25#      Knee/Hip Exercises: Standing   Lateral Step Up Step Height: 8";Right;3 sets;10 reps    Wall Squat Limitations wall sit 3x30 seconds at 90d knee flexion    Rebounder 3x10 SLS on Airex pad with 2kg ball      Manual Therapy   Manual Therapy Soft tissue mobilization;Edema management    Edema Management proximal edema massage to R calf/ thigh    Soft tissue mobilization IASTM to fibrotic tissue at lateral knee                       PT  Short Term Goals - 04/16/21 1226       PT SHORT TERM GOAL #1   Title Pt will demonstrate understanding and adherence of HEP in order to promote independence in the management of her impairments.    Baseline Pt reports daily adherence to her HEP.    Time 4    Period Weeks    Status Achieved    Target Date 03/15/21      PT SHORT TERM GOAL #2   Title Pt will demonstrate R knee flexion AROM of 100 degrees of better in order to progress to normal gait pattern.    Baseline 101 degrees (04/16/2021)    Time 4    Period Weeks    Status Achieved    Target Date 03/15/21      PT SHORT TERM GOAL #3   Title Pt will be able to achieve 10 active SLR's in order to demonstrate improved functional quadriceps strength.    Baseline Pt performed 20 SLR on R    Time 4    Period Weeks    Status Achieved    Target Date 03/15/21               PT Long Term Goals - 04/16/21 1227       PT LONG TERM GOAL #1   Title Pt will demonstrate 135 degrees of R knee  flexion AROM with 2/10 pain or less in order to return to functional activities such as grocery shopping without limitation.    Baseline 101d on 04/16/2021; 75d with 10/10 pain on 04/02/2021; 55 degrees with 10/10 pain at eval    Time 8    Period Weeks    Status On-going      PT LONG TERM GOAL #2   Title Pt will achieve 0 degrees of R knee extension AROM with 2/10 pain or less in order to promote normal gait pattern with heel strike.    Baseline 5d shy of full extension on 04/16/2021; 5d shy of full extension on 03/25/2021; 15 degrees shy of full extension with 10/10 pain at eval.    Time 8    Period Weeks    Status On-going      PT LONG TERM GOAL #3   Title Pt will demonstrate R knee extension and flexion MMT of 4+/5 or greater in order to progress LE strengthening regimen without limitation.    Baseline R knee flexion and extension MMT =5/5 on 04/16/2021    Time 8    Period Weeks    Status Achieved      PT LONG TERM GOAL #4   Title Pt will report maximum daily pain of 3/10 or less in order to be more functionally independent without limitation due to pain.    Baseline 5/10 associated with increased activity    Time 8    Period Weeks    Status On-going                   Plan - 04/30/21 1255     Clinical Impression Statement The pt responded well to all interventions today, demonstrating good form and no increase in pain with progressed exercises today. She does report "crawling" sensation along the back of her legs, but reports this has been a typical sensation since her surgery. She responded well to IASTM today and demonstrates improved tissue motility to touch following this intervention. She will continue to benefit from skilled PT to address her primary impairments and return to her  prior level of function without limitation.    Personal Factors and Comorbidities Comorbidity 3+    Comorbidities See medical Hx    Examination-Activity Limitations  Bathing;Dressing;Sit;Bend;Squat;Stairs;Locomotion Level;Stand    Examination-Participation Restrictions Driving;Cleaning;Laundry;Yard Work    Stability/Clinical Decision Making Stable/Uncomplicated    Designer, jewellery Low    Rehab Potential Good    PT Frequency 1x / week    PT Duration 6 weeks    PT Treatment/Interventions ADLs/Self Care Home Management;Aquatic Therapy;Biofeedback;Moist Heat;Electrical Stimulation;Cryotherapy;DME Instruction;Therapeutic activities;Stair training;Gait training;Functional mobility training;Therapeutic exercise;Balance training;Neuromuscular re-education;Scar mobilization;Compression bandaging;Patient/family education;Manual techniques;Passive range of motion;Joint Manipulations    PT Next Visit Plan continue R knee ROM/ LE strengthening exercises, continue to utilize [facility] TKR protocol    PT [place] and Agree with Plan of Care Patient;Family member/caregiver    Family Member Consulted daughter             Patient will benefit from skilled therapeutic intervention in order to improve the following deficits and impairments:  Abnormal gait, Decreased balance, Decreased endurance, Decreased mobility, Difficulty walking, Hypomobility, Increased edema, Decreased range of motion, Decreased activity tolerance, Decreased strength, Pain, Impaired flexibility  Visit Diagnosis: Chronic pain of right knee  General weakness  Difficulty in walking, not elsewhere classified  Stiffness of right knee, not elsewhere classified     Problem List Patient Active Problem List   Diagnosis Date Noted   Sleep disturbance 03/19/2021   Blurred vision 03/19/2021   Primary osteoarthritis of right knee 02/11/2021   Status post total right knee replacement 02/11/2021   Chronic low back pain with sciatica 09/18/2020   Encounter for health maintenance examination in adult 01/09/2020   Radicular pain of right  lower extremity 04/18/2019   Other intervertebral disc degeneration, lumbar region 04/18/2019   Radiculopathy of lumbosacral region 03/21/2019   Right leg pain 03/21/2019   Chest pain 09/23/2018   Neuropathy involving both lower extremities 09/23/2018   Screen for colon cancer 05/17/2018   Vaccine counseling 05/17/2018   Fatty liver 05/17/2018   Screening for cervical cancer 05/17/2018   Hypothyroidism 05/17/2018   Need for pneumococcal vaccination 05/17/2018   Generalized abdominal tenderness 05/17/2018   Need for influenza vaccination 04/13/2018   Other fatigue 02/08/2018   Edema 01/25/2018   Renal failure (ARF), acute on chronic (HCC) 01/20/2018   Normocytic normochromic anemia 01/20/2018   Tremor 01/05/2018   Vitamin D deficiency 01/05/2018   RUQ abdominal pain 10/27/2017   Hemorrhoids 10/27/2017   High risk medication use 09/25/2017   Type 2 diabetes mellitus with neurological complications ([place]) [id number]   Proteinuria 09/02/2017   Paresthesia 09/02/2017   IgA nephropathy 06/10/2017   Language barrier 06/10/2017   Gastroesophageal reflux disease 06/10/2017   SOB (shortness of breath) 02/24/2017   Hyperlipidemia 04/21/2016   Chronic right-sided low back pain with right-sided sciatica 01/29/2016   Morbid obesity ([place]) 01/29/2016   Hypokalemia 01/29/2016   Essential hypertension 01/29/2016    Vanessa [place], PT, DPT 04/30/21 1:01 PM   [facility] [address] [place], [name], [address] Phone: [phone]   Fax:  [phone]  Name: ANIYHA EDSON MRN: [id number] Date of Birth: 10/19/1970

## 2021-05-07 ENCOUNTER — Ambulatory Visit: Payer: Medicaid Other

## 2021-05-07 ENCOUNTER — Other Ambulatory Visit: Payer: Self-pay

## 2021-05-07 ENCOUNTER — Ambulatory Visit (INDEPENDENT_AMBULATORY_CARE_PROVIDER_SITE_OTHER): Payer: Medicaid Other | Admitting: Physician Assistant

## 2021-05-07 ENCOUNTER — Encounter: Payer: Self-pay | Admitting: Orthopaedic Surgery

## 2021-05-07 DIAGNOSIS — Z96651 Presence of right artificial knee joint: Secondary | ICD-10-CM

## 2021-05-07 MED ORDER — TRAMADOL HCL 50 MG PO TABS: 50.0000 mg | ORAL_TABLET | Freq: Three times a day (TID) | ORAL | 2 refills | Status: DC | PRN

## 2021-05-07 NOTE — Progress Notes (Signed)
Post-Op Visit Note   Patient: Linda Black           Date of Birth: [birth year]           MRN: [id number] Visit Date: 05/07/2021 PCP: Carlena Hurl, PA-C   Assessment & Plan:  Chief Complaint:  Chief Complaint  Patient presents with   Right Knee - Pain   Visit Diagnoses:  1. Hx of total knee replacement, right     Plan: Patient is a pleasant 50 year old female who is here today with interpreter.  She is nearly 3 months status post right total knee replacement, date of surgery 02/11/2021.  She has been doing well.  She is taking tramadol for pain.  She has gone to physical therapy 1 day a week.  Examination of the right knee reveals a fully healed surgical scar without complication.  Range of motion 0 to 100 degrees.  Stable valgus varus stress.  She is neurovascular intact distally.  At this point and like for her to continue working on range of motion.  Dental prophylaxis reinforced.  Follow-up with [place] in 3 months time for repeat evaluation.  Call with concerns or questions.  Follow-Up Instructions: Return in about 3 months (around 08/07/2021).   Orders:  No orders of the defined types were placed in this encounter.  Meds ordered this encounter  Medications   traMADol (ULTRAM) 50 MG tablet    Sig: Take 1 tablet (50 mg total) by mouth 3 (three) times daily as needed.    Dispense:  30 tablet    Refill:  2    Imaging: No new imaging  PMFS History: Patient Active Problem List   Diagnosis Date Noted   Sleep disturbance 03/19/2021   Blurred vision 03/19/2021   Primary osteoarthritis of right knee 02/11/2021   Status post total right knee replacement 02/11/2021   Chronic low back pain with sciatica 09/18/2020   Encounter for health maintenance examination in adult 01/09/2020   Radicular pain of right lower extremity 04/18/2019   Other intervertebral disc degeneration, lumbar region 04/18/2019   Radiculopathy of lumbosacral region 03/21/2019   Right leg pain 03/21/2019    Chest pain 09/23/2018   Neuropathy involving both lower extremities 09/23/2018   Screen for colon cancer 05/17/2018   Vaccine counseling 05/17/2018   Fatty liver 05/17/2018   Screening for cervical cancer 05/17/2018   Hypothyroidism 05/17/2018   Need for pneumococcal vaccination 05/17/2018   Generalized abdominal tenderness 05/17/2018   Need for influenza vaccination 04/13/2018   Other fatigue 02/08/2018   Edema 01/25/2018   Renal failure (ARF), acute on chronic (HCC) 01/20/2018   Normocytic normochromic anemia 01/20/2018   Tremor 01/05/2018   Vitamin D deficiency 01/05/2018   RUQ abdominal pain 10/27/2017   Hemorrhoids 10/27/2017   High risk medication use 09/25/2017   Type 2 diabetes mellitus with neurological complications ([place]) [id number]   Proteinuria 09/02/2017   Paresthesia 09/02/2017   IgA nephropathy 06/10/2017   Language barrier 06/10/2017   Gastroesophageal reflux disease 06/10/2017   SOB (shortness of breath) 02/24/2017   Hyperlipidemia 04/21/2016   Chronic right-sided low back pain with right-sided sciatica 01/29/2016   Morbid obesity ([place]) 01/29/2016   Hypokalemia 01/29/2016   Essential hypertension 01/29/2016   Past Medical History:  Diagnosis Date   Diabetes mellitus without complication ([place])    Dr. Kelton Pillar   Fatty liver 2019   elevated LFTs, negative for Hep B and Hep C 04/2018   GERD (gastroesophageal reflux disease)  Hepatitis    Hyperlipidemia    Hypertension    Hypothyroidism 2019   IgA nephropathy 2019   per biopsy, Dr. Corliss Parish   Language barrier    Lumbar stenosis    Dr. Louanne Skye   Obesity    Osteoarthritis    Proteinuria     Family History  Problem Relation Age of Onset   Diabetes Mother    Hypertension Father    Colon cancer Neg Hx    Stomach cancer Neg Hx    Esophageal cancer Neg Hx    Rectal cancer Neg Hx    Liver cancer Neg Hx     Past Surgical History:  Procedure Laterality Date   RENAL BIOPSY  2018   IgA  nephropathy, Dr. Corliss Parish   TOTAL KNEE ARTHROPLASTY Right 02/11/2021   Procedure: RIGHT TOTAL KNEE ARTHROPLASTY;  Surgeon: Leandrew Koyanagi, MD;  Location: [place];  Service: Orthopedics;  Laterality: Right;   Social History   Occupational History   Occupation: homemaker  Tobacco Use   Smoking status: Never   Smokeless tobacco: Never  Vaping Use   Vaping Use: Never used  Substance and Sexual Activity   Alcohol use: Never   Drug use: No   Sexual activity: Not on file

## 2021-05-09 ENCOUNTER — Other Ambulatory Visit: Payer: Self-pay | Admitting: Medical

## 2021-05-14 ENCOUNTER — Other Ambulatory Visit: Payer: Self-pay

## 2021-05-14 ENCOUNTER — Ambulatory Visit: Payer: Medicaid Other | Attending: Medical

## 2021-05-14 DIAGNOSIS — M25561 Pain in right knee: Secondary | ICD-10-CM | POA: Diagnosis not present

## 2021-05-14 DIAGNOSIS — M25661 Stiffness of right knee, not elsewhere classified: Secondary | ICD-10-CM | POA: Diagnosis present

## 2021-05-14 DIAGNOSIS — R262 Difficulty in walking, not elsewhere classified: Secondary | ICD-10-CM

## 2021-05-14 DIAGNOSIS — G8929 Other chronic pain: Secondary | ICD-10-CM

## 2021-05-14 DIAGNOSIS — R531 Weakness: Secondary | ICD-10-CM | POA: Diagnosis present

## 2021-05-14 NOTE — Patient Instructions (Signed)
  [id number]

## 2021-05-14 NOTE — Therapy (Signed)
[place], [name], [address] Phone: [phone]   Fax:  [phone]  Physical Therapy Treatment  Patient Details  Name: Linda Black MRN: [id number] Date of Birth: 10/16/1970 Referring Provider (PT): Carlena Hurl   Encounter Date: 05/14/2021   PT End of Session - 05/14/21 1308     Visit Number 10    Number of Visits 17    Date for PT Re-Evaluation 05/28/21    Authorization Type [employer] MCD, Request additional auth at last authorized visit, 27 annual visits    PT Start Time 1220    PT Stop Time 1310   10 minutes vasopneumatic   PT Time Calculation (min) 50 min    Equipment Utilized During Treatment Gait belt    Activity Tolerance Patient tolerated treatment well    Behavior During Therapy [facility] for tasks assessed/performed             Past Medical History:  Diagnosis Date   Diabetes mellitus without complication ([place])    Dr. Kelton Pillar   Fatty liver 2019   elevated LFTs, negative for Hep B and Hep C 04/2018   GERD (gastroesophageal reflux disease)    Hepatitis    Hyperlipidemia    Hypertension    Hypothyroidism 2019   IgA nephropathy 2019   per biopsy, Dr. Corliss Parish   Language barrier    Lumbar stenosis    Dr. Louanne Skye   Obesity    Osteoarthritis    Proteinuria     Past Surgical History:  Procedure Laterality Date   RENAL BIOPSY  2018   IgA nephropathy, Dr. Corliss Parish   TOTAL KNEE ARTHROPLASTY Right 02/11/2021   Procedure: RIGHT TOTAL KNEE ARTHROPLASTY;  Surgeon: Leandrew Koyanagi, MD;  Location: [place];  Service: Orthopedics;  Laterality: Right;    There were no vitals filed for this visit.   Subjective Assessment - 05/14/21 1219     Subjective Pt reports doing well recently with very low pain over the past week. She reports doing her exercises daily with no difficulty and has been walking regularly.    Patient is accompained by: Interpreter   Y Hin   Currently in  Pain? Yes    Pain Score 1     Pain Location Knee    Pain Orientation Right    Pain Descriptors / Indicators Tightness    Pain Type Chronic pain    Pain Onset More than a month ago    Pain Frequency Intermittent                [facility] PT Assessment - 05/14/21 0001       Observation/Other Assessments   Observations Surgical scar is healing well, no signs of infection, improved swelling since last visit, pitting edema noted at R lateral knee      AROM   Right Knee Extension 3    Right Knee Flexion 106                           [facility] Adult PT Treatment/Exercise - 05/14/21 0001       Knee/Hip Exercises: Aerobic   Stationary Bike full revolutions at self-selected pace x5 minutes      Knee/Hip Exercises: Machines for Strengthening   Cybex Knee Extension Double leg 3x10 with 10#    Hip Cybex abduction and extension 2x10 each BIL with 25#      Knee/Hip Exercises: Standing  Wall Squat Limitations wall sit x30 seconds at 90d knee flexion   terminated after 1 set due to increase in   Other Standing Knee Exercises Single leg squat to table with double leg stand 3x8      Modalities   Modalities Vasopneumatic      Vasopneumatic   Number Minutes Vasopneumatic  10 minutes    Vasopnuematic Location  Knee    Vasopneumatic Pressure Medium    Vasopneumatic Temperature  34d F      Manual Therapy   Manual Therapy Joint mobilization    Joint Mobilization Grade 4 AP/PA tib-fem mobilizations at 90 degrees and 20 degrees knee flexion                     PT Education - 05/14/21 1308     Education Details Updated HEP, stressed importance of regularly knee flexion passive and active stretching.    Person(s) Educated Patient    Methods Explanation;Demonstration;Handout    Comprehension Verbalized understanding;Returned demonstration              PT Short Term Goals - 04/16/21 1226       PT SHORT TERM GOAL #1   Title Pt will demonstrate  understanding and adherence of HEP in order to promote independence in the management of her impairments.    Baseline Pt reports daily adherence to her HEP.    Time 4    Period Weeks    Status Achieved    Target Date 03/15/21      PT SHORT TERM GOAL #2   Title Pt will demonstrate R knee flexion AROM of 100 degrees of better in order to progress to normal gait pattern.    Baseline 101 degrees (04/16/2021)    Time 4    Period Weeks    Status Achieved    Target Date 03/15/21      PT SHORT TERM GOAL #3   Title Pt will be able to achieve 10 active SLR's in order to demonstrate improved functional quadriceps strength.    Baseline Pt performed 20 SLR on R    Time 4    Period Weeks    Status Achieved    Target Date 03/15/21               PT Long Term Goals - 05/14/21 1316       PT LONG TERM GOAL #1   Title Pt will demonstrate 135 degrees of R knee flexion AROM with 2/10 pain or less in order to return to functional activities such as grocery shopping without limitation.    Baseline 106d on 05/14/2021; 101d on 04/16/2021; 75d with 10/10 pain on 04/02/2021; 55 degrees with 10/10 pain at eval    Time 8    Period Weeks    Status On-going      PT LONG TERM GOAL #2   Title Pt will achieve 0 degrees of R knee extension AROM with 2/10 pain or less in order to promote normal gait pattern with heel strike.    Baseline 4d shy of full extension on 05/14/2021; 5d shy of full extension on 04/16/2021; 5d shy of full extension on 03/25/2021; 15 degrees shy of full extension with 10/10 pain at eval.    Time 8    Period Weeks    Status On-going      PT LONG TERM GOAL #3   Title Pt will demonstrate R knee extension and flexion MMT of 4+/5 or greater in order  to progress LE strengthening regimen without limitation.    Baseline R knee flexion and extension MMT =5/5 on 04/16/2021    Time 8    Period Weeks    Status Achieved      PT LONG TERM GOAL #4   Title Pt will report maximum daily pain of  3/10 or less in order to be more functionally independent without limitation due to pain.    Baseline 5/10 associated with increased activity    Time 8    Period Weeks    Status On-going                   Plan - 05/14/21 1311     Clinical Impression Statement The pt responded well to all interventions today, demonstrating good form and no increase in pain with progressed exercises today. However, when performing wall sits today, the pt required help standing up from her squatted position by the PT utilizing a gait belt. She reports acute increase in pain following the exercise to 3/10, which she reports returned to baseline after a few minutes of rest. She was able to complete the remainder of the treatment session without pain. Additionally, she responded well to tib-fem joint mobilizations with improved knee extension AROM to 0 degrees from 4 degrees at the start of the session, although knee flexion AROM remained unchanged. She responded well to vasopneumatic therapy at the end of the session, with 1/10 pain at the end of the treatment. She will continue to benefit from skilled PT to address her primary impairments and return to her prior level of function without limitation.    Personal Factors and Comorbidities Comorbidity 3+    Comorbidities See medical Hx    Examination-Activity Limitations Bathing;Dressing;Sit;Bend;Squat;Stairs;Locomotion Level;Stand    Examination-Participation Restrictions Driving;Cleaning;Laundry;Yard Work    Stability/Clinical Decision Making Stable/Uncomplicated    Designer, jewellery Low    Rehab Potential Good    PT Frequency 1x / week    PT Duration 6 weeks    PT Treatment/Interventions ADLs/Self Care Home Management;Aquatic Therapy;Biofeedback;Moist Heat;Electrical Stimulation;Cryotherapy;DME Instruction;Therapeutic activities;Stair training;Gait training;Functional mobility training;Therapeutic exercise;Balance training;Neuromuscular  re-education;Scar mobilization;Compression bandaging;Patient/family education;Manual techniques;Passive range of motion;Joint Manipulations    PT Next Visit Plan continue R knee ROM/ LE strengthening exercises, progress closed-chain knee flexion exercises, continue to utilize [facility] TKR protocol    PT [place] and Agree with Plan of Care Patient             Patient will benefit from skilled therapeutic intervention in order to improve the following deficits and impairments:  Abnormal gait, Decreased balance, Decreased endurance, Decreased mobility, Difficulty walking, Hypomobility, Increased edema, Decreased range of motion, Decreased activity tolerance, Decreased strength, Pain, Impaired flexibility  Visit Diagnosis: Chronic pain of right knee  General weakness  Difficulty in walking, not elsewhere classified  Stiffness of right knee, not elsewhere classified     Problem List Patient Active Problem List   Diagnosis Date Noted   Sleep disturbance 03/19/2021   Blurred vision 03/19/2021   Primary osteoarthritis of right knee 02/11/2021   Status post total right knee replacement 02/11/2021   Chronic low back pain with sciatica 09/18/2020   Encounter for health maintenance examination in adult 01/09/2020   Radicular pain of right lower extremity 04/18/2019   Other intervertebral disc degeneration, lumbar region 04/18/2019   Radiculopathy of lumbosacral region 03/21/2019   Right leg pain 03/21/2019   Chest pain 09/23/2018   Neuropathy involving  both lower extremities 09/23/2018   Screen for colon cancer 05/17/2018   Vaccine counseling 05/17/2018   Fatty liver 05/17/2018   Screening for cervical cancer 05/17/2018   Hypothyroidism 05/17/2018   Need for pneumococcal vaccination 05/17/2018   Generalized abdominal tenderness 05/17/2018   Need for influenza vaccination 04/13/2018   Other fatigue 02/08/2018   Edema  01/25/2018   Renal failure (ARF), acute on chronic (HCC) 01/20/2018   Normocytic normochromic anemia 01/20/2018   Tremor 01/05/2018   Vitamin D deficiency 01/05/2018   RUQ abdominal pain 10/27/2017   Hemorrhoids 10/27/2017   High risk medication use 09/25/2017   Type 2 diabetes mellitus with neurological complications ([place]) [id number]   Proteinuria 09/02/2017   Paresthesia 09/02/2017   IgA nephropathy 06/10/2017   Language barrier 06/10/2017   Gastroesophageal reflux disease 06/10/2017   SOB (shortness of breath) 02/24/2017   Hyperlipidemia 04/21/2016   Chronic right-sided low back pain with right-sided sciatica 01/29/2016   Morbid obesity ([place]) 01/29/2016   Hypokalemia 01/29/2016   Essential hypertension 01/29/2016    Vanessa [place], PT, DPT 05/14/21 1:18 PM   [facility] [address] [place], [name], [address] Phone: [phone]   Fax:  [phone]  Name: ZELLA ABOULHOSN MRN: [id number] Date of Birth: April 14, 1971

## 2021-05-21 ENCOUNTER — Ambulatory Visit: Payer: Medicaid Other

## 2021-05-21 ENCOUNTER — Other Ambulatory Visit: Payer: Self-pay

## 2021-05-21 DIAGNOSIS — M25661 Stiffness of right knee, not elsewhere classified: Secondary | ICD-10-CM

## 2021-05-21 DIAGNOSIS — R262 Difficulty in walking, not elsewhere classified: Secondary | ICD-10-CM

## 2021-05-21 DIAGNOSIS — G8929 Other chronic pain: Secondary | ICD-10-CM

## 2021-05-21 DIAGNOSIS — M25561 Pain in right knee: Secondary | ICD-10-CM | POA: Diagnosis not present

## 2021-05-21 DIAGNOSIS — R531 Weakness: Secondary | ICD-10-CM

## 2021-05-21 NOTE — Therapy (Signed)
[name], [name], [address] Phone: [phone]   Fax:  [phone]  Physical Therapy Treatment  Patient Details  Name: Linda Black MRN: [id number] Date of Birth: 03/11/1971 Referring Provider (PT): Carlena Hurl   Encounter Date: 05/21/2021   PT End of Session - 05/21/21 1307     Visit Number 11    Number of Visits 17    Date for PT Re-Evaluation 05/28/21    Authorization Type [employer] MCD, Request additional auth at last authorized visit, 57 annual visits    PT Start Time 43   pt arrived 15 minutes late to her appointment   PT Stop Time 1310   10 minutes moist heat   PT Time Calculation (min) 40 min    Equipment Utilized During Treatment Gait belt    Activity Tolerance Patient tolerated treatment well    Behavior During Therapy [facility] for tasks assessed/performed             Past Medical History:  Diagnosis Date   Diabetes mellitus without complication ([place])    Dr. Kelton Pillar   Fatty liver 2019   elevated LFTs, negative for Hep B and Hep C 04/2018   GERD (gastroesophageal reflux disease)    Hepatitis    Hyperlipidemia    Hypertension    Hypothyroidism 2019   IgA nephropathy 2019   per biopsy, Dr. Corliss Parish   Language barrier    Lumbar stenosis    Dr. Louanne Skye   Obesity    Osteoarthritis    Proteinuria     Past Surgical History:  Procedure Laterality Date   RENAL BIOPSY  2018   IgA nephropathy, Dr. Corliss Parish   TOTAL KNEE ARTHROPLASTY Right 02/11/2021   Procedure: RIGHT TOTAL KNEE ARTHROPLASTY;  Surgeon: Leandrew Koyanagi, MD;  Location: [place];  Service: Orthopedics;  Laterality: Right;    There were no vitals filed for this visit.   Subjective Assessment - 05/21/21 1231     Subjective Pt reports Rt LBP today since her last visit that she rates as a 3/10 that she reports Tylenol has helped to reduce. She reports no pain in her knee today, but she reports continued  stiffness in her R knee. She has continued to perform her HEP regularly.    Patient is accompained by: Interpreter   Y Hin   Currently in Pain? Yes    Pain Score 3     Pain Location Back    Pain Orientation Right    Pain Descriptors / Indicators Sharp    Pain Type Acute pain    Pain Onset In the past 7 days    Pain Frequency Intermittent                [facility] PT Assessment - 05/21/21 0001       Palpation   Spinal mobility lumbar CPA's WNL, painful at L3-L5    Palpation comment TTP with palpably taut muscle bands along R lumbar paraspinals                           [facility] Adult PT Treatment/Exercise - 05/21/21 0001       Knee/Hip Exercises: Machines for Strengthening   Total Gym Leg Press 3x10 with 35#      Knee/Hip Exercises: Standing   Other Standing Knee Exercises Single leg squat to table with double leg stand 3x10      Modalities  Modalities Moist Heat      Moist Heat Therapy   Number Minutes Moist Heat 10 Minutes    Moist Heat Location Lumbar Spine      Manual Therapy   Manual Therapy Soft tissue mobilization    Soft tissue mobilization Efflourage to R lumbar paraspinals x8 minutes                     PT Education - 05/21/21 1314     Education Details Spoke with pt regarding objective findings related to her LBP, along with probable pathophysiology behind her pain presentation.    Person(s) Educated Patient    Methods Explanation    Comprehension Verbalized understanding              PT Short Term Goals - 04/16/21 1226       PT SHORT TERM GOAL #1   Title Pt will demonstrate understanding and adherence of HEP in order to promote independence in the management of her impairments.    Baseline Pt reports daily adherence to her HEP.    Time 4    Period Weeks    Status Achieved    Target Date 03/15/21      PT SHORT TERM GOAL #2   Title Pt will demonstrate R knee flexion AROM of 100 degrees of better in order to  progress to normal gait pattern.    Baseline 101 degrees (04/16/2021)    Time 4    Period Weeks    Status Achieved    Target Date 03/15/21      PT SHORT TERM GOAL #3   Title Pt will be able to achieve 10 active SLR's in order to demonstrate improved functional quadriceps strength.    Baseline Pt performed 20 SLR on R    Time 4    Period Weeks    Status Achieved    Target Date 03/15/21               PT Long Term Goals - 05/14/21 1316       PT LONG TERM GOAL #1   Title Pt will demonstrate 135 degrees of R knee flexion AROM with 2/10 pain or less in order to return to functional activities such as grocery shopping without limitation.    Baseline 106d on 05/14/2021; 101d on 04/16/2021; 75d with 10/10 pain on 04/02/2021; 55 degrees with 10/10 pain at eval    Time 8    Period Weeks    Status On-going      PT LONG TERM GOAL #2   Title Pt will achieve 0 degrees of R knee extension AROM with 2/10 pain or less in order to promote normal gait pattern with heel strike.    Baseline 4d shy of full extension on 05/14/2021; 5d shy of full extension on 04/16/2021; 5d shy of full extension on 03/25/2021; 15 degrees shy of full extension with 10/10 pain at eval.    Time 8    Period Weeks    Status On-going      PT LONG TERM GOAL #3   Title Pt will demonstrate R knee extension and flexion MMT of 4+/5 or greater in order to progress LE strengthening regimen without limitation.    Baseline R knee flexion and extension MMT =5/5 on 04/16/2021    Time 8    Period Weeks    Status Achieved      PT LONG TERM GOAL #4   Title Pt will report maximum daily pain  of 3/10 or less in order to be more functionally independent without limitation due to pain.    Baseline 5/10 associated with increased activity    Time 8    Period Weeks    Status On-going                   Plan - 05/21/21 1308     Clinical Impression Statement Pt arrives 15 minutes late to her appointment, which led to a  truncated treatment session today. She presents with new onset of R sided LBP which began following the last treatment session. Upon a quick assessment of the lumbar spine, lumbar passive accessories are WNL but painful at L3-L5. She is also TTP to R lumbar paraspinals. These findings are indicative of probable acute lumbar muscle strain. Her lumbar pain was improved from 3/10 to 1/10 following manual therapy and moist heat therapy. She tolerated exercises performed today well with good form and no increase in pain. The pt will continue to benefit from skilled PT to address her primary impairments and return to her prior level of function with less limitation due to pain.    Personal Factors and Comorbidities Comorbidity 3+    Comorbidities See medical Hx    Examination-Activity Limitations Bathing;Dressing;Sit;Bend;Squat;Stairs;Locomotion Level;Stand    Examination-Participation Restrictions Driving;Cleaning;Laundry;Yard Work    Stability/Clinical Decision Making Stable/Uncomplicated    Designer, jewellery Low    Rehab Potential Good    PT Frequency 1x / week    PT Duration 6 weeks    PT Treatment/Interventions ADLs/Self Care Home Management;Aquatic Therapy;Biofeedback;Moist Heat;Electrical Stimulation;Cryotherapy;DME Instruction;Therapeutic activities;Stair training;Gait training;Functional mobility training;Therapeutic exercise;Balance training;Neuromuscular re-education;Scar mobilization;Compression bandaging;Patient/family education;Manual techniques;Passive range of motion;Joint Manipulations    PT Next Visit Plan continue R knee ROM/ LE strengthening exercises, progress closed-chain knee flexion exercises, continue to utilize [facility] TKR protocol, re-assess acute LBP    PT Home Exercise Plan [id number]    Consulted and Agree with Plan of Care Patient             Patient will benefit from skilled therapeutic intervention in order to improve the following  deficits and impairments:  Abnormal gait, Decreased balance, Decreased endurance, Decreased mobility, Difficulty walking, Hypomobility, Increased edema, Decreased range of motion, Decreased activity tolerance, Decreased strength, Pain, Impaired flexibility  Visit Diagnosis: Chronic pain of right knee  General weakness  Difficulty in walking, not elsewhere classified  Stiffness of right knee, not elsewhere classified     Problem List Patient Active Problem List   Diagnosis Date Noted   Sleep disturbance 03/19/2021   Blurred vision 03/19/2021   Primary osteoarthritis of right knee 02/11/2021   Status post total right knee replacement 02/11/2021   Chronic low back pain with sciatica 09/18/2020   Encounter for health maintenance examination in adult 01/09/2020   Radicular pain of right lower extremity 04/18/2019   Other intervertebral disc degeneration, lumbar region 04/18/2019   Radiculopathy of lumbosacral region 03/21/2019   Right leg pain 03/21/2019   Chest pain 09/23/2018   Neuropathy involving both lower extremities 09/23/2018   Screen for colon cancer 05/17/2018   Vaccine counseling 05/17/2018   Fatty liver 05/17/2018   Screening for cervical cancer 05/17/2018   Hypothyroidism 05/17/2018   Need for pneumococcal vaccination 05/17/2018   Generalized abdominal tenderness 05/17/2018   Need for influenza vaccination 04/13/2018   Other fatigue 02/08/2018   Edema 01/25/2018   Renal failure (ARF), acute on chronic (HCC) 01/20/2018   Normocytic normochromic anemia 01/20/2018   Tremor  01/05/2018   Vitamin D deficiency 01/05/2018   RUQ abdominal pain 10/27/2017   Hemorrhoids 10/27/2017   High risk medication use 09/25/2017   Type 2 diabetes mellitus with neurological complications ([place]) [id number]   Proteinuria 09/02/2017   Paresthesia 09/02/2017   IgA nephropathy 06/10/2017   Language barrier 06/10/2017   Gastroesophageal reflux disease 06/10/2017   SOB (shortness of  breath) 02/24/2017   Hyperlipidemia 04/21/2016   Chronic right-sided low back pain with right-sided sciatica 01/29/2016   Morbid obesity ([place]) 01/29/2016   Hypokalemia 01/29/2016   Essential hypertension 01/29/2016    Vanessa [place], PT, DPT 05/21/21 1:15 PM   [facility] [address] [place], [name], [address] Phone: [phone]   Fax:  [phone]  Name: Linda Black MRN: [id number] Date of Birth: 1970/09/09

## 2021-05-28 ENCOUNTER — Other Ambulatory Visit: Payer: Self-pay

## 2021-05-28 ENCOUNTER — Ambulatory Visit: Payer: Medicaid Other

## 2021-05-28 DIAGNOSIS — G8929 Other chronic pain: Secondary | ICD-10-CM

## 2021-05-28 DIAGNOSIS — R531 Weakness: Secondary | ICD-10-CM

## 2021-05-28 DIAGNOSIS — R262 Difficulty in walking, not elsewhere classified: Secondary | ICD-10-CM

## 2021-05-28 DIAGNOSIS — M25661 Stiffness of right knee, not elsewhere classified: Secondary | ICD-10-CM

## 2021-05-28 DIAGNOSIS — M25561 Pain in right knee: Secondary | ICD-10-CM | POA: Diagnosis not present

## 2021-05-28 NOTE — Therapy (Signed)
[place], [name], [address] Phone: [phone]   Fax:  [phone]  Physical Therapy Treatment/ Discharge Summary  Patient Details  Name: TESSIA KASSIN MRN: [id number] Date of Birth: May 02, 1971 Referring Provider (PT): Carlena Hurl   Encounter Date: 05/28/2021   PT End of Session - 05/28/21 1240     Visit Number 12    Number of Visits 17    Date for PT Re-Evaluation 05/28/21    Authorization Type [employer] MCD, Request additional auth at last authorized visit, 87 annual visits    PT Start Time 1215    PT Stop Time 1235    PT Time Calculation (min) 20 min    Activity Tolerance Patient tolerated treatment well    Behavior During Therapy [facility] for tasks assessed/performed             Past Medical History:  Diagnosis Date   Diabetes mellitus without complication ([place])    Dr. Kelton Pillar   Fatty liver 2019   elevated LFTs, negative for Hep B and Hep C 04/2018   GERD (gastroesophageal reflux disease)    Hepatitis    Hyperlipidemia    Hypertension    Hypothyroidism 2019   IgA nephropathy 2019   per biopsy, Dr. Corliss Parish   Language barrier    Lumbar stenosis    Dr. Louanne Skye   Obesity    Osteoarthritis    Proteinuria     Past Surgical History:  Procedure Laterality Date   RENAL BIOPSY  2018   IgA nephropathy, Dr. Corliss Parish   TOTAL KNEE ARTHROPLASTY Right 02/11/2021   Procedure: RIGHT TOTAL KNEE ARTHROPLASTY;  Surgeon: Leandrew Koyanagi, MD;  Location: [place];  Service: Orthopedics;  Laterality: Right;    There were no vitals filed for this visit.   Subjective Assessment - 05/28/21 1213     Subjective Pt reports that her R knee is doing much better today.  She also reports that her back feels much better today. She reports 0/10 pain today, although she still has stiffness in her knee. She also reports that her home exercises have been going well. She reports that her maximum pain  levels only raise to a 1-2/10 after prolonged walking. She reports feeling independent in the management of her remaining impairments and is ready to be discharged from PT at this time. She reports that she has an appointment with her orthopedic surgeon in 3 months.    Patient is accompained by: Interpreter    How long can you sit comfortably? Unlimited    How long can you stand comfortably? 1 hour    How long can you walk comfortably? >15 minutes    Currently in Pain? No/denies    Pain Score 0-No pain                [facility] PT Assessment - 05/28/21 0001       AROM   Right Knee Extension 0    Right Knee Flexion 106                                    PT Education - 05/28/21 1238     Education Details Discussed with pt the importance of continued knee flexion ROM exercises at home; also discussed objective measures and progress made in PT to this point.    Person(s) Educated Patient    Methods Explanation  Comprehension Verbalized understanding              PT Short Term Goals - 04/16/21 1226       PT SHORT TERM GOAL #1   Title Pt will demonstrate understanding and adherence of HEP in order to promote independence in the management of her impairments.    Baseline Pt reports daily adherence to her HEP.    Time 4    Period Weeks    Status Achieved    Target Date 03/15/21      PT SHORT TERM GOAL #2   Title Pt will demonstrate R knee flexion AROM of 100 degrees of better in order to progress to normal gait pattern.    Baseline 101 degrees (04/16/2021)    Time 4    Period Weeks    Status Achieved    Target Date 03/15/21      PT SHORT TERM GOAL #3   Title Pt will be able to achieve 10 active SLR's in order to demonstrate improved functional quadriceps strength.    Baseline Pt performed 20 SLR on R    Time 4    Period Weeks    Status Achieved    Target Date 03/15/21               PT Long Term Goals - 05/28/21 1246       PT LONG  TERM GOAL #1   Title Pt will demonstrate 135 degrees of R knee flexion AROM with 2/10 pain or less in order to return to functional activities such as grocery shopping without limitation.    Baseline 106d on 05/28/2021; 106d on 05/14/2021; 101d on 04/16/2021; 75d with 10/10 pain on 04/02/2021; 55 degrees with 10/10 pain at eval    Time 8    Period Weeks    Status Partially Met      PT LONG TERM GOAL #2   Title Pt will achieve 0 degrees of R knee extension AROM with 2/10 pain or less in order to promote normal gait pattern with heel strike.    Baseline 0d of extension achieved on 05/27/2021; 4d shy of full extension on 05/14/2021; 5d shy of full extension on 04/16/2021; 5d shy of full extension on 03/25/2021; 15 degrees shy of full extension with 10/10 pain at eval.    Time 8    Period Weeks    Status Achieved      PT LONG TERM GOAL #3   Title Pt will demonstrate R knee extension and flexion MMT of 4+/5 or greater in order to progress LE strengthening regimen without limitation.    Baseline R knee flexion and extension MMT =5/5 on 04/16/2021    Time 8    Period Weeks    Status Achieved      PT LONG TERM GOAL #4   Title Pt will report maximum daily pain of 3/10 or less in order to be more functionally independent without limitation due to pain.    Baseline 1-2/10 maximum daily pain (05/27/2021); 5/10 associated with increased activity    Time 8    Period Weeks    Status Achieved                   Plan - 05/28/21 1240     Clinical Impression Statement Upon re-assessment, the pt has continued to progress in the areas of standing functional ability, R knee extension AROM, and baseline pain. She has not made further progress in her R knee flexion  AROM. Due to meeting most of her functional goals, along with the pt's report of functional independence, the pt is discharged from PT at this time. She was encouraged to follow up with her orthopedic surgeon regarding her lack of R knee AROM  and also to continue her home exercises with an emphasis on restoring her ROM. She states she has a follow-up appointment with her orthopedic surgeon in 3 months.    PT Next Visit Plan Pt is discharged from [place] and Agree with Plan of Care Patient             Patient will benefit from skilled therapeutic intervention in order to improve the following deficits and impairments:     Visit Diagnosis: Chronic pain of right knee  General weakness  Difficulty in walking, not elsewhere classified  Stiffness of right knee, not elsewhere classified     Problem List Patient Active Problem List   Diagnosis Date Noted   Sleep disturbance 03/19/2021   Blurred vision 03/19/2021   Primary osteoarthritis of right knee 02/11/2021   Status post total right knee replacement 02/11/2021   Chronic low back pain with sciatica 09/18/2020   Encounter for health maintenance examination in adult 01/09/2020   Radicular pain of right lower extremity 04/18/2019   Other intervertebral disc degeneration, lumbar region 04/18/2019   Radiculopathy of lumbosacral region 03/21/2019   Right leg pain 03/21/2019   Chest pain 09/23/2018   Neuropathy involving both lower extremities 09/23/2018   Screen for colon cancer 05/17/2018   Vaccine counseling 05/17/2018   Fatty liver 05/17/2018   Screening for cervical cancer 05/17/2018   Hypothyroidism 05/17/2018   Need for pneumococcal vaccination 05/17/2018   Generalized abdominal tenderness 05/17/2018   Need for influenza vaccination 04/13/2018   Other fatigue 02/08/2018   Edema 01/25/2018   Renal failure (ARF), acute on chronic (HCC) 01/20/2018   Normocytic normochromic anemia 01/20/2018   Tremor 01/05/2018   Vitamin D deficiency 01/05/2018   RUQ abdominal pain 10/27/2017   Hemorrhoids 10/27/2017   High risk medication use 09/25/2017   Type 2 diabetes mellitus with neurological complications ([place]) 60/11/5995    Proteinuria 09/02/2017   Paresthesia 09/02/2017   IgA nephropathy 06/10/2017   Language barrier 06/10/2017   Gastroesophageal reflux disease 06/10/2017   SOB (shortness of breath) 02/24/2017   Hyperlipidemia 04/21/2016   Chronic right-sided low back pain with right-sided sciatica 01/29/2016   Morbid obesity ([place]) 01/29/2016   Hypokalemia 01/29/2016   Essential hypertension 01/29/2016     [place], [name], [address] Phone: [phone]   Fax:  [phone]  Name: LENNAN MALONE MRN: [id number] Date of Birth: 09/11/1970  PHYSICAL THERAPY DISCHARGE SUMMARY  Visits from Start of Care: 12  Current functional level related to goals / functional outcomes: Pt has achieved all of her rehab goals, except for restoration of R knee AROM, which was partially met.   Remaining deficits: Limited R knee flexion AROM (106 degrees)   Education / Equipment: HEP   Patient agrees to discharge. Patient goals were met. Patient is being discharged due to being pleased with the current functional level.  Vanessa [place], PT, DPT 05/28/21 12:50 PM

## 2021-07-11 ENCOUNTER — Other Ambulatory Visit: Payer: Self-pay

## 2021-07-11 ENCOUNTER — Telehealth (INDEPENDENT_AMBULATORY_CARE_PROVIDER_SITE_OTHER): Payer: Medicaid Other | Admitting: Family Medicine

## 2021-07-11 ENCOUNTER — Encounter: Payer: Self-pay | Admitting: Family Medicine

## 2021-07-11 VITALS — Temp 99.7°F | Ht 64.0 in | Wt 183.0 lb

## 2021-07-11 DIAGNOSIS — J111 Influenza due to unidentified influenza virus with other respiratory manifestations: Secondary | ICD-10-CM

## 2021-07-11 MED ORDER — OSELTAMIVIR PHOSPHATE 75 MG PO CAPS: 75.0000 mg | ORAL_CAPSULE | Freq: Two times a day (BID) | ORAL | 0 refills | Status: DC

## 2021-07-11 NOTE — Progress Notes (Signed)
Start time: 3:01 End time: 3:16  Virtual Visit via Video Note  I connected with Linda Black on 07/11/21 by a video enabled telemedicine application and verified that I am speaking with the correct person using two identifiers.  Location: Patient: home At home with her daughter [name] translating for pt. Provider: office   I discussed the limitations of evaluation and management by telemedicine and the availability of in person appointments. The patient expressed understanding and agreed to proceed.  History of Present Illness:  Chief Complaint  Patient presents with   Cough    VIRTUAL cough, chills, fever (yesterday), chest pain and dizziness that started 3 days ago. Has not taken any home covid tests.   3 days ago (Monday) she started with cough and cold symptoms. Yesterday she started feeling cold and hot (didn't check temperature until during the visit). She also has sore throat. Yesterday she noted some pain in her chest, but this has resolved. Nasal drainage is watery, clear. Denies sinus pain.  +myalgias--body feels heavy. She reports some dizziness yesterday, described as spinning sensation. She feels better today    +exposure to her granddaughter who was diagnosed with the flu.  Had 2 doses of COVID vaccine. Got flu shot this year Never had COVID  PMH, PSH, SH reviewed  Took Mucinex DM twice yesterday  Outpatient Encounter Medications as of 07/11/2021  Medication Sig Note   ACCU-CHEK AVIVA PLUS test strip TEST TWICE A DAY    acetaminophen (TYLENOL) 325 MG tablet Take 650 mg by mouth every 6 (six) hours as needed. 07/11/2021: Last dose this morning   amLODipine (NORVASC) 10 MG tablet Take 1 tablet (10 mg total) by mouth daily.    cholecalciferol (VITAMIN D3) 25 MCG (1000 UNIT) tablet Take 1 tablet (1,000 Units total) by mouth daily.    clonazePAM (KLONOPIN) 0.5 MG tablet Take 1 tablet (0.5 mg total) by mouth at bedtime as needed for anxiety.    Insulin Pen Needle 32G  X 4 MM MISC 1 each by Does not apply route at bedtime.    KERENDIA 10 MG TABS Take 10 mg by mouth daily.    levothyroxine (SYNTHROID) 25 MCG tablet TAKE 1 TABLET(25 MCG) BY MOUTH DAILY BEFORE AND BREAKFAST    loratadine (CLARITIN) 10 MG tablet Take 10 mg by mouth daily. 07/11/2021: Last dose yesterday am   losartan (COZAAR) 100 MG tablet TAKE 1 TABLET(100 MG) BY MOUTH DAILY    metoprolol succinate (TOPROL-XL) 25 MG 24 hr tablet 1 tablet po daily    rosuvastatin (CRESTOR) 40 MG tablet TAKE 1 TABLET(40 MG) BY MOUTH DAILY (Patient taking differently: Take 40 mg by mouth daily.)    traMADol (ULTRAM) 50 MG tablet Take 1 tablet (50 mg total) by mouth 3 (three) times daily as needed.    [DISCONTINUED] furosemide (LASIX) 40 MG tablet Take 1 tablet (40 mg total) by mouth 2 (two) times daily.    [DISCONTINUED] potassium chloride SA (KLOR-CON) 20 MEQ tablet Take 1 tablet (20 mEq total) by mouth daily.    No facility-administered encounter medications on file as of 07/11/2021.   Allergies  Allergen Reactions   Lasix [Furosemide] Shortness Of Breath and Swelling    SOB, facial swelling   Lisinopril Swelling    Angioedema    Covid-19 ([place]) Vaccine     Got quite sick after both initial doses   Other Swelling    [place] Lister Fruit    ROS:  +subjective fever/chills, body aches, URI symptoms per HPI.  Dizziness (vertigo) and chest pain resolved.  No nausea or vomiting.  Diarrhea yesterday. Normal BM today. No dysuria, no rashes.   Observations/Objective:  Ht 5\' 4"  (1.626 m)   Wt 183 lb (83 kg)   LMP 03/23/2017   BMI 31.41 kg/m  T99.7 (forehead)  Well-appearing female, seated at kitchen table. Wet cough heard during visit. She otherwise appeared comfortable, was eating a snack She was alert, normal movements. She seemed comfortable when speaking to her daughter, in no distress   Assessment and Plan:  Influenza - +known flu exposure with classic symptoms.  On the edge of whether there will be any  benefit from tamiflu, but they wish to try it. Supportive measures reviewed - Plan: oseltamivir (TAMIFLU) 75 MG capsule  Risks/SE of tamiflu reviewed.  Daughter advised (and she was jotting some notes): Drinks plenty of water Mucinex DM 12 hour tablets and take twice daily. Use tylenol if needed for any pain or fever. Sinus rinses if pain develops. Bland diet  To contact us if symptoms persist, worsen, change.   Follow Up Instructions:    I discussed the assessment and treatment plan with the patient. The patient was provided an opportunity to ask questions and all were answered. The patient agreed with the plan and demonstrated an understanding of the instructions.   The patient was advised to call back or seek an in-person evaluation if the symptoms worsen or if the condition fails to improve as anticipated.     [name], MD

## 2021-07-19 ENCOUNTER — Other Ambulatory Visit: Payer: Self-pay | Admitting: Physician Assistant

## 2021-07-23 ENCOUNTER — Telehealth: Payer: Self-pay | Admitting: Orthopaedic Surgery

## 2021-07-23 ENCOUNTER — Other Ambulatory Visit: Payer: Self-pay | Admitting: Physician Assistant

## 2021-07-23 MED ORDER — TRAMADOL HCL 50 MG PO TABS: 50.0000 mg | ORAL_TABLET | Freq: Three times a day (TID) | ORAL | 2 refills | Status: DC | PRN

## 2021-07-23 NOTE — Telephone Encounter (Signed)
Pt's daughter called requesting a refill of tramadol. Please call daughter [name] when med called in. Phone number is [phone].

## 2021-07-23 NOTE — Telephone Encounter (Signed)
Sent in

## 2021-08-07 ENCOUNTER — Ambulatory Visit: Payer: Medicaid Other | Admitting: Orthopaedic Surgery

## 2021-08-13 ENCOUNTER — Other Ambulatory Visit: Payer: Self-pay | Admitting: Medical

## 2021-08-13 ENCOUNTER — Other Ambulatory Visit: Payer: Self-pay | Admitting: Physician Assistant

## 2021-08-16 ENCOUNTER — Other Ambulatory Visit: Payer: Self-pay | Admitting: Medical

## 2021-08-28 ENCOUNTER — Encounter: Payer: Self-pay | Admitting: Medical

## 2021-09-01 ENCOUNTER — Other Ambulatory Visit: Payer: Self-pay | Admitting: Medical

## 2021-09-15 ENCOUNTER — Other Ambulatory Visit: Payer: Self-pay | Admitting: Physician Assistant

## 2021-09-20 ENCOUNTER — Other Ambulatory Visit: Payer: Self-pay | Admitting: Physician Assistant

## 2021-10-21 ENCOUNTER — Other Ambulatory Visit: Payer: Self-pay | Admitting: Medical

## 2021-10-21 NOTE — Telephone Encounter (Signed)
Spoke to interpreter for pt and she will relay the message and call us back. She is overdue for cpe/medcheck  ?

## 2021-10-26 ENCOUNTER — Other Ambulatory Visit: Payer: Self-pay | Admitting: Medical

## 2021-11-28 ENCOUNTER — Other Ambulatory Visit: Payer: Self-pay | Admitting: Medical

## 2021-12-13 ENCOUNTER — Ambulatory Visit: Payer: Medicaid Other | Admitting: Medical

## 2021-12-13 NOTE — Assessment & Plan Note (Signed)
Main treatment is weight loss, healthy diet, low sugar low-fat diet and exercise regularly ?

## 2021-12-13 NOTE — Assessment & Plan Note (Signed)
Updated labs today, continue medication ?

## 2021-12-13 NOTE — Assessment & Plan Note (Signed)
I have not seen you in a while.  You should come in a little frequently for follow-up on this issue including labs. ?See your eye doctor yearly for diabetic eye exam ?Check your feet daily for sores or wounds ?Monitor your blood sugars at home fasting with goal less than 130 fasting ?

## 2021-12-13 NOTE — Assessment & Plan Note (Signed)
I reviewed her January 2023 nephrology notes, she has had longstanding microscopic hematuria, proteinuria, worsening GFR, status post renal biopsy 2019 showing IgA nephropathy.  She has a history of prednisone and Cytoxan 100 mg daily back in 2018.  She was advised to remain on ARB.  She was started on [place] in the past year.  She was continued on Lasix 40 mg twice daily, Cozaar 100 mg daily, Amlodipine 10mg  daily.  ?

## 2021-12-13 NOTE — Progress Notes (Signed)
Error ?Patient was a no show ? ? ? ? ? ? ? ? ? ? ? ?Recommendations: ?Continue to return yearly for your annual wellness and preventative care visits.  This gives [place] a chance to discuss healthy lifestyle, exercise, vaccinations, review your chart record, and perform screenings where appropriate. ? ?I recommend you see your eye doctor yearly for routine vision care. ? ?I recommend you see your dentist yearly for routine dental care including hygiene visits twice yearly. ? ? ?Vaccination recommendations were reviewed ?Immunization History  ?Administered Date(s) Administered  ? Influenza,inj,Quad PF,6+ Mos 04/13/2018, 04/18/2019  ? MMR 02/22/2004, 03/09/2009  ? PFIZER(Purple Top)SARS-COV-2 Vaccination 10/27/2019, 11/22/2019  ? Pneumococcal Conjugate-13 05/17/2018  ? Td 02/22/2004, 04/24/2004, 11/13/2004  ? Tdap 03/19/2021  ? ? ?You are due for the following vaccines:  ?Pneumococcal 23 vaccine.  You should continue to have this vaccine every 5 years ?You will be due for a shingles vaccine at age 23 coming up soon ?You should have a yearly flu shot in the fall ? ? ?Screening for cancer: ?Colon cancer screening: ?Due for colon cancer screening ? ?Breast cancer screening: ?You should perform a self breast exam monthly.   ?We reviewed recommendations for regular mammograms and breast cancer screening. ? ?Please call to schedule your mammogram.  ? ?[facility]  ?[phone] ?[address], [address] ?[place], [place] [address] ? ? ?Cervical cancer screening: ?We reviewed recommendations for pap smear screening. ?  ?Skin cancer screening: ?Check your skin regularly for new changes, growing lesions, or other lesions of concern ?Come in for evaluation if you have skin lesions of concern. ? ?Lung cancer screening: ?If you have a greater than 20 pack year history of tobacco use, then you may qualify for lung cancer screening with a chest CT scan.   Please call your insurance company to inquire about  coverage for this test. ? ?We currently don't have screenings for other cancers besides breast, cervical, colon, and lung cancers.  If you have a strong family history of cancer or have other cancer screening concerns, please let me know.  ? ? ?Bone health: ?Get at least 150 minutes of aerobic exercise weekly ?Get weight bearing exercise at least once weekly ?Bone density test:  ?A bone density test is an imaging test that uses a type of X-ray to measure the amount of calcium and other minerals in your bones. ?The test may be used to diagnose or screen you for a condition that causes weak or thin bones (osteoporosis), predict your risk for a broken bone (fracture), or determine how well your osteoporosis treatment is working. ?The bone density test is recommended for females 29 and older, or females or males <80 if certain risk factors such as thyroid disease, long term use of steroids such as for asthma or rheumatological issues, vitamin D deficiency, estrogen deficiency, family history of osteoporosis, self or family history of fragility fracture in first degree relative. ? ? ? ?Heart health: ?Get at least 150 minutes of aerobic exercise weekly ?Limit alcohol ?It is important to maintain a healthy blood pressure and healthy cholesterol numbers ? ?Heart disease screening: ?Screening for heart disease includes screening for blood pressure, fasting lipids, glucose/diabetes screening, BMI height to weight ratio, reviewed of smoking status, physical activity, and diet.   ? ?Goals include blood pressure 120/80 or less, maintaining a healthy lipid/cholesterol profile, preventing diabetes or keeping diabetes numbers under good control, not smoking or using tobacco products, exercising most days per  week or at least 150 minutes per week of exercise, and eating healthy variety of fruits and vegetables, healthy oils, and avoiding unhealthy food choices like fried food, fast food, high sugar and high cholesterol foods.    ? ?Other tests may possibly include EKG test, CT coronary calcium score, echocardiogram, exercise treadmill stress test.  ? ?Consider baseline cardiology consult or CT coronary scan of the heart screen ? ? ? ?Medical care options: ?I recommend you continue to seek care here first for routine care.  We try really hard to have available appointments Monday through Friday daytime hours for sick visits, acute visits, and physicals.  Urgent care should be used for after hours and weekends for significant issues that cannot wait till the next day.  The emergency department should be used for significant potentially life-threatening emergencies.  The emergency department is expensive, can often have long wait times for less significant concerns, so try to utilize primary care, urgent care, or telemedicine when possible to avoid unnecessary trips to the emergency department.  Virtual visits and telemedicine have been introduced since the pandemic started in 2020, and can be convenient ways to receive medical care.  We offer virtual appointments as well to assist you in a variety of options to seek medical care. ? ?Other significant issues: ?Problem List Items Addressed This Visit   ? ? Essential hypertension  ? Hyperlipidemia  ? IgA nephropathy  ? Language barrier  ? Gastroesophageal reflux disease  ? Type 2 diabetes mellitus with neurological complications ([place])  ? Proteinuria  ? High risk medication use  ? Vitamin D deficiency  ? Edema  ? Screen for colon cancer  ? Vaccine counseling  ? Fatty liver  ? Screening for cervical cancer  ? Hypothyroidism  ? Encounter for health maintenance examination in adult - Primary  ? Status post total right knee replacement  ? ?Other Visit Diagnoses   ? ? Encounter for screening mammogram for malignant neoplasm of breast      ? ?  ? ? ?Renal ?Hepatic ?A1c ?Thyroid panel ?Cbc diff ?Lipid ?Vit D ?Mammo ?Colon ? ?   ?     ?    ?   ? ? ?

## 2021-12-13 NOTE — Assessment & Plan Note (Signed)
Updated labs today, continue statin ?

## 2021-12-13 NOTE — Assessment & Plan Note (Signed)
Updated labs today, continue supplement ?

## 2022-01-01 ENCOUNTER — Encounter: Payer: Medicaid Other | Admitting: Medical

## 2022-01-01 ENCOUNTER — Telehealth: Payer: Self-pay | Admitting: Medical

## 2022-01-01 NOTE — Telephone Encounter (Signed)
Please review her chart and no show records.  She no-showed today and no showed another  recent physical visit so that his 2 recent no-shows for physical within a short period of time.  This likely just needs to be grounds for dismissal.

## 2022-01-13 ENCOUNTER — Encounter: Payer: Self-pay | Admitting: Family Medicine

## 2022-01-21 NOTE — Telephone Encounter (Signed)
Dismissal sent 

## 2022-01-23 ENCOUNTER — Other Ambulatory Visit: Payer: Self-pay | Admitting: Physician Assistant

## 2022-01-23 ENCOUNTER — Other Ambulatory Visit: Payer: Self-pay | Admitting: Medical

## 2022-01-28 ENCOUNTER — Ambulatory Visit (INDEPENDENT_AMBULATORY_CARE_PROVIDER_SITE_OTHER): Payer: Medicaid Other | Admitting: Orthopaedic Surgery

## 2022-01-28 ENCOUNTER — Ambulatory Visit (INDEPENDENT_AMBULATORY_CARE_PROVIDER_SITE_OTHER): Payer: Medicaid Other

## 2022-01-28 ENCOUNTER — Ambulatory Visit: Payer: Self-pay

## 2022-01-28 DIAGNOSIS — M545 Low back pain, unspecified: Secondary | ICD-10-CM

## 2022-01-28 DIAGNOSIS — Z96651 Presence of right artificial knee joint: Secondary | ICD-10-CM

## 2022-01-28 MED ORDER — PREDNISONE 10 MG (21) PO TBPK: ORAL_TABLET | ORAL | 3 refills | Status: DC

## 2022-02-13 ENCOUNTER — Telehealth: Payer: Self-pay

## 2022-02-13 NOTE — Telephone Encounter (Signed)
Pt. Daughter called wanting to schedule an apt with you for her mom. I let her know that she had been dismissed due to no showing two CPE's. The daughter said she had no idea that she had these two apts. I told her what phone number we had on file for her and she said that was the wrong number. She gave me her phone number which is [phone] Linda Black. She wanted to know if you could please take her mom back as a pt. Because she had no idea that she had these two apts.

## 2022-02-17 NOTE — Telephone Encounter (Signed)
Noted  

## 2022-02-19 ENCOUNTER — Ambulatory Visit: Payer: Medicaid Other | Admitting: Medical

## 2022-02-19 VITALS — BP 150/90 | HR 73 | Wt 193.8 lb

## 2022-02-19 DIAGNOSIS — I10 Essential (primary) hypertension: Secondary | ICD-10-CM

## 2022-02-19 DIAGNOSIS — Z124 Encounter for screening for malignant neoplasm of cervix: Secondary | ICD-10-CM | POA: Diagnosis not present

## 2022-02-19 DIAGNOSIS — E559 Vitamin D deficiency, unspecified: Secondary | ICD-10-CM | POA: Diagnosis not present

## 2022-02-19 DIAGNOSIS — Z758 Other problems related to medical facilities and other health care: Secondary | ICD-10-CM

## 2022-02-19 DIAGNOSIS — G8929 Other chronic pain: Secondary | ICD-10-CM

## 2022-02-19 DIAGNOSIS — M5440 Lumbago with sciatica, unspecified side: Secondary | ICD-10-CM

## 2022-02-19 DIAGNOSIS — Z7185 Encounter for immunization safety counseling: Secondary | ICD-10-CM

## 2022-02-19 DIAGNOSIS — K760 Fatty (change of) liver, not elsewhere classified: Secondary | ICD-10-CM

## 2022-02-19 DIAGNOSIS — E785 Hyperlipidemia, unspecified: Secondary | ICD-10-CM

## 2022-02-19 DIAGNOSIS — Z1231 Encounter for screening mammogram for malignant neoplasm of breast: Secondary | ICD-10-CM

## 2022-02-19 DIAGNOSIS — E1149 Type 2 diabetes mellitus with other diabetic neurological complication: Secondary | ICD-10-CM

## 2022-02-19 DIAGNOSIS — Z79899 Other long term (current) drug therapy: Secondary | ICD-10-CM

## 2022-02-19 DIAGNOSIS — N028 Recurrent and persistent hematuria with other morphologic changes: Secondary | ICD-10-CM

## 2022-02-19 DIAGNOSIS — Z789 Other specified health status: Secondary | ICD-10-CM

## 2022-02-19 DIAGNOSIS — Z1211 Encounter for screening for malignant neoplasm of colon: Secondary | ICD-10-CM | POA: Diagnosis not present

## 2022-02-19 DIAGNOSIS — E039 Hypothyroidism, unspecified: Secondary | ICD-10-CM

## 2022-02-19 DIAGNOSIS — N02B9 Other recurrent and persistent immunoglobulin A nephropathy: Secondary | ICD-10-CM

## 2022-02-19 MED ORDER — KERENDIA 10 MG PO TABS: 10.0000 mg | ORAL_TABLET | Freq: Every day | ORAL | 3 refills | Status: DC

## 2022-02-19 MED ORDER — ACCU-CHEK AVIVA PLUS VI STRP: ORAL_STRIP | 3 refills | Status: DC

## 2022-02-19 MED ORDER — METOPROLOL SUCCINATE ER 25 MG PO TB24: ORAL_TABLET | ORAL | 3 refills | Status: DC

## 2022-02-19 MED ORDER — LEVOTHYROXINE SODIUM 25 MCG PO TABS: 25.0000 ug | ORAL_TABLET | Freq: Every day | ORAL | 0 refills | Status: DC

## 2022-02-19 MED ORDER — LOSARTAN POTASSIUM 100 MG PO TABS: ORAL_TABLET | ORAL | 3 refills | Status: DC

## 2022-02-19 MED ORDER — CALCITRIOL 0.25 MCG PO CAPS
0.2500 ug | ORAL_CAPSULE | Freq: Every day | ORAL | 3 refills | Status: DC
Start: 2022-02-19 — End: 2022-12-24

## 2022-02-19 MED ORDER — AMLODIPINE BESYLATE 10 MG PO TABS: 10.0000 mg | ORAL_TABLET | Freq: Every day | ORAL | 3 refills | Status: DC

## 2022-02-19 MED ORDER — ROSUVASTATIN CALCIUM 40 MG PO TABS: 40.0000 mg | ORAL_TABLET | Freq: Every day | ORAL | 3 refills | Status: DC

## 2022-02-19 MED ORDER — ACCU-CHEK SOFTCLIX LANCETS MISC: 12 refills | Status: DC

## 2022-02-19 NOTE — Progress Notes (Signed)
Subjective:  Linda Black is a 51 y.o. female who presents for Chief Complaint  Patient presents with   Medication Refill    Med check- no other concerns     Here with daughter Linda Black who interprets.  Here for med check.   She had 2 recent no show visits, but we called and talked to family recently about getting her in and avoiding no shows.  Medical team: Dr. Basil Black, Dr. Frankey Black, orthopedics Dr. Collier Black, endocrinology Dr. Corliss Black, nephrology Linda Black, Linda Eng, PA-C here for primary care   Hypertension -she ran out of several medications.  Currently she is only taking Lasix and metoprolol.  Ran out of amlodipine and losartan  Hypothyroidism -they do not have the pills with her today which makes me believe she has ran out of this as well  Hyperlipidemia -she is taking her Crestor and has this with her today  Diabetes type 2 -no current medication.  She ran out of strips and lancets so not checking sugars  Kidney disease-she is taking her Lasix and [name] and calcitriol  Uses Accu check aviva plus  She has ongoing chronic back pain, pain down her legs, burning sensations in the back of the legs  No other aggravating or relieving factors.    No other c/o.  Past Medical History:  Diagnosis Date   Diabetes mellitus without complication ([place])    Dr. Kelton Black   Fatty liver 2019   elevated LFTs, negative for Hep B and Hep C 04/2018   GERD (gastroesophageal reflux disease)    Hepatitis    Hyperlipidemia    Hypertension    Hypothyroidism 2019   IgA nephropathy 2019   per biopsy, Dr. Corliss Black   Language barrier    Lumbar stenosis    Dr. Louanne Black   Obesity    Osteoarthritis    Proteinuria    No current outpatient medications on file prior to visit.   No current facility-administered medications on file prior to visit.     The following portions of the patient's history were reviewed and updated as appropriate: allergies,  current medications, past family history, past medical history, past social history, past surgical history and problem list.  ROS Otherwise as in subjective above  Objective: BP (!) 150/90   Pulse 73   Wt 193 lb 12.8 oz (87.9 kg)   LMP 03/23/2017   SpO2 99%   BMI 33.27 kg/m   Wt Readings from Last 3 Encounters:  02/19/22 193 lb 12.8 oz (87.9 kg)  07/11/21 183 lb (83 kg)  03/19/21 172 lb 9.6 oz (78.3 kg)   BP Readings from Last 3 Encounters:  02/19/22 (!) 150/90  03/19/21 124/80  02/12/21 134/77    General appearance: alert, no distress, well developed, well nourished Neck: supple, no lymphadenopathy, no thyromegaly, no masses Heart: RRR, normal S1, S2, no murmurs Lungs: CTA bilaterally, no wheezes, rhonchi, or rales Pulses: 2+ radial pulses, 1+ pedal pulses, normal cap refill Ext: no edema  Diabetic Foot Exam - Simple   Simple Foot Form Diabetic Foot exam was performed with the following findings: Yes 02/19/2022  2:05 PM  Visual Inspection See comments: Yes Sensation Testing See comments: Yes Pulse Check Posterior Tibialis and Dorsalis pulse intact bilaterally: Yes Comments Only able to feel 6.0 monofilament for sensation, flat feet bilat      Assessment: Encounter Diagnoses  Name Primary?   Type 2 diabetes mellitus with neurological complications (HCC) Yes   Vitamin D  deficiency    Screening for cervical cancer    Screen for colon cancer    Essential hypertension    Fatty liver    High risk medication use    Hyperlipidemia, unspecified hyperlipidemia type    Hypothyroidism, unspecified type    IgA nephropathy    Language barrier    Vaccine counseling    Chronic low back pain with sciatica, sciatica laterality unspecified, unspecified back pain laterality    Encounter for screening mammogram for malignant neoplasm of breast      Plan: Unfortunately she had been out of medication and nobody called [place] or nephrology about refills.  This is also  complicated by the fact that she sees nephrology who manages her blood pressure in conjunction with our office.  She also apparently is not taking her thyroid medicine  Get back on her typical regimen that she was on months ago that she should be taking now  Follow-up with nephrology in the near future for routine recheck  Updated labs today  Referrals today for gastroenterology for screening colonoscopy  Order placed for mammogram.  Daughter will call to schedule her mammogram  She is up-to-date on Pap but recommended she come in in the near future for updated Pap as her last one was 2019  Refill diabetes testing supplies.  She is not currently on anything for diabetes  Chronic back pain-advised follow-up with orthopedic  Linda Black was seen today for medication refill.  Diagnoses and all orders for this visit:  Type 2 diabetes mellitus with neurological complications ([place]) -     Renal Function Panel -     Hepatic function panel -     Hemoglobin A1c  Vitamin D deficiency  Screening for cervical cancer  Screen for colon cancer -     Ambulatory referral to Gastroenterology  Essential hypertension  Fatty liver  High risk medication use  Hyperlipidemia, unspecified hyperlipidemia type -     Lipid panel  Hypothyroidism, unspecified type -     TSH + free T4  IgA nephropathy -     Hepatic function panel  Language barrier  Vaccine counseling  Chronic low back pain with sciatica, sciatica laterality unspecified, unspecified back pain laterality  Encounter for screening mammogram for malignant neoplasm of breast -     MM DIGITAL SCREENING BILATERAL; Future  Other orders -     rosuvastatin (CRESTOR) 40 MG tablet; Take 1 tablet (40 mg total) by mouth daily. -     losartan (COZAAR) 100 MG tablet; TAKE 1 TABLET(100 MG) BY MOUTH DAILY -     KERENDIA 10 MG TABS; Take 10 mg by mouth daily. -     levothyroxine (SYNTHROID) 25 MCG tablet; Take 1 tablet (25 mcg total) by mouth  daily before breakfast. -     amLODipine (NORVASC) 10 MG tablet; Take 1 tablet (10 mg total) by mouth daily. -     calcitRIOL (ROCALTROL) 0.25 MCG capsule; Take 1 capsule (0.25 mcg total) by mouth daily. -     glucose blood (ACCU-CHEK AVIVA PLUS) test strip; Test daily -     metoprolol succinate (TOPROL-XL) 25 MG 24 hr tablet; 1 tablet po daily -     Accu-Chek Softclix Lancets lancets; Use as instructed    Follow up: pending labs, referrals

## 2022-02-19 NOTE — Patient Instructions (Addendum)
Recommendations:  Please call to schedule your screening mammogram.   [facility]  [phone] [address], [place], [place] [address]   Shingles vaccine:  I recommend you have a shingles vaccine to help prevent shingles or herpes zoster outbreak.   Please call your insurer to inquire about coverage for the Shingrix vaccine given in 2 doses.   Some insurers cover this vaccine after age 50, some cover this after age 48.  If your insurer covers this, then call to schedule appointment to have this vaccine here.   Expect a phone call from referrals to schedule with the gastroenterologist about colonoscopy cancer screen   Get back on the medication she is not taking.  She should be taken levothyroxine for thyroid, should be on blood pressure pills including amlodipine, losartan, metoprolol and fluid pill Lasix  Follow-up with orthopedics soon given her chronic back pain

## 2022-02-20 ENCOUNTER — Encounter: Payer: Self-pay | Admitting: Internal Medicine

## 2022-02-20 ENCOUNTER — Other Ambulatory Visit: Payer: Self-pay | Admitting: Medical

## 2022-02-20 DIAGNOSIS — E875 Hyperkalemia: Secondary | ICD-10-CM

## 2022-02-20 LAB — RENAL FUNCTION PANEL
Albumin: 5.1 g/dL — ABNORMAL HIGH (ref 3.9–4.9)
BUN/Creatinine Ratio: 15 (ref 9–23)
BUN: 45 mg/dL — ABNORMAL HIGH (ref 6–24)
CO2: 16 mmol/L — ABNORMAL LOW (ref 20–29)
Calcium: 9.5 mg/dL (ref 8.7–10.2)
Chloride: 100 mmol/L (ref 96–106)
Creatinine, Ser: 3.04 mg/dL — ABNORMAL HIGH (ref 0.57–1.00)
Glucose: 87 mg/dL (ref 70–99)
Phosphorus: 5.4 mg/dL — ABNORMAL HIGH (ref 3.0–4.3)
Potassium: 5.4 mmol/L — ABNORMAL HIGH (ref 3.5–5.2)
Sodium: 135 mmol/L (ref 134–144)
eGFR: 18 mL/min/{1.73_m2} — ABNORMAL LOW (ref >59)

## 2022-02-20 LAB — LIPID PANEL
Chol/HDL Ratio: 2.8 ratio (ref 0.0–4.4)
Cholesterol, Total: 192 mg/dL (ref 100–199)
HDL: 68 mg/dL (ref >39)
LDL Chol Calc (NIH): 82 mg/dL (ref 0–99)
Triglycerides: 262 mg/dL — ABNORMAL HIGH (ref 0–149)
VLDL Cholesterol Cal: 42 mg/dL — ABNORMAL HIGH (ref 5–40)

## 2022-02-20 LAB — HEPATIC FUNCTION PANEL
ALT: 12 IU/L (ref 0–32)
AST: 20 IU/L (ref 0–40)
Alkaline Phosphatase: 111 IU/L (ref 44–121)
Bilirubin Total: 0.4 mg/dL (ref 0.0–1.2)
Bilirubin, Direct: 0.11 mg/dL (ref 0.00–0.40)
Total Protein: 8.1 g/dL (ref 6.0–8.5)

## 2022-02-20 LAB — TSH+FREE T4
Free T4: 1.08 ng/dL (ref 0.82–1.77)
TSH: 4.06 u[IU]/mL (ref 0.450–4.500)

## 2022-02-20 LAB — HEMOGLOBIN A1C
Est. average glucose Bld gHb Est-mCnc: 117 mg/dL
Hgb A1c MFr Bld: 5.7 % — ABNORMAL HIGH (ref 4.8–5.6)

## 2022-02-27 ENCOUNTER — Ambulatory Visit (INDEPENDENT_AMBULATORY_CARE_PROVIDER_SITE_OTHER): Payer: Medicaid Other | Admitting: Medical

## 2022-02-27 VITALS — BP 160/92 | HR 64 | Temp 98.5°F | Wt 194.4 lb

## 2022-02-27 DIAGNOSIS — E1149 Type 2 diabetes mellitus with other diabetic neurological complication: Secondary | ICD-10-CM

## 2022-02-27 DIAGNOSIS — E876 Hypokalemia: Secondary | ICD-10-CM | POA: Diagnosis not present

## 2022-02-27 DIAGNOSIS — E039 Hypothyroidism, unspecified: Secondary | ICD-10-CM

## 2022-02-27 DIAGNOSIS — E785 Hyperlipidemia, unspecified: Secondary | ICD-10-CM

## 2022-02-27 DIAGNOSIS — I10 Essential (primary) hypertension: Secondary | ICD-10-CM

## 2022-02-27 DIAGNOSIS — N028 Recurrent and persistent hematuria with other morphologic changes: Secondary | ICD-10-CM

## 2022-02-27 DIAGNOSIS — Z789 Other specified health status: Secondary | ICD-10-CM

## 2022-02-27 MED ORDER — SODIUM BICARBONATE 650 MG PO TABS: 650.0000 mg | ORAL_TABLET | Freq: Two times a day (BID) | ORAL | 3 refills | Status: DC

## 2022-02-27 MED ORDER — FUROSEMIDE 40 MG PO TABS: 40.0000 mg | ORAL_TABLET | Freq: Two times a day (BID) | ORAL | 3 refills | Status: DC

## 2022-02-27 NOTE — Progress Notes (Signed)
Subjective:  Linda Black is a 51 y.o. female who presents for Chief Complaint  Patient presents with   other    F/u BP and recheck potassium, been running 150-160 top number, yesterday 155. Bottom number 90-92. Felt heavy pressure on her head pt. Stated losartan was helping controll her BP but didn't understand why she is not on that one anymore. Pt. Needs refill on losartan been off for 2 weeks and BP has been worse.      Here for follow-up with interpreter today.  Her daughter is out in the lobby.  I saw her about a week ago for med check.  At that time her potassium and kidney marker was abnormal or worse.  Last visit she had been out of some of her medications and had some confusion about her medications.  She reports that she is back on her medications except apparently is not on losartan and [place].  Oddly enough when she was here last week she had her [place].  Losartan was refilled last visit but she says she did not pick that up from the pharmacy or it was not available.  Last visit I also advised that she was to hold off on metoprolol but she is still taking this.  She has her medications with her in a bag today.  Losartan and [name] is not in the bag.  She apparently is only taking Lasix once daily as well instead of the labeled BID.  She does drink a good amount of water and is careful with her diet.  No other new concerns today.  No other aggravating or relieving factors.    No other c/o.  Past Medical History:  Diagnosis Date   Diabetes mellitus without complication ([place])    Dr. Kelton Pillar   Fatty liver 2019   elevated LFTs, negative for Hep B and Hep C 04/2018   GERD (gastroesophageal reflux disease)    Hepatitis    Hyperlipidemia    Hypertension    Hypothyroidism 2019   IgA nephropathy 2019   per biopsy, Dr. Corliss Parish   Language barrier    Lumbar stenosis    Dr. Louanne Skye   Obesity    Osteoarthritis    Proteinuria    Current Outpatient Medications  on File Prior to Visit  Medication Sig Dispense Refill   Accu-Chek Softclix Lancets lancets Use as instructed 100 each 12   amLODipine (NORVASC) 10 MG tablet Take 1 tablet (10 mg total) by mouth daily. 90 tablet 3   calcitRIOL (ROCALTROL) 0.25 MCG capsule Take 1 capsule (0.25 mcg total) by mouth daily. 90 capsule 3   glucose blood (ACCU-CHEK AVIVA PLUS) test strip Test daily 100 strip 3   levothyroxine (SYNTHROID) 25 MCG tablet Take 1 tablet (25 mcg total) by mouth daily before breakfast. 90 tablet 0   rosuvastatin (CRESTOR) 40 MG tablet Take 1 tablet (40 mg total) by mouth daily. 90 tablet 3   KERENDIA 10 MG TABS Take 10 mg by mouth daily. 90 tablet 3   losartan (COZAAR) 100 MG tablet TAKE 1 TABLET(100 MG) BY MOUTH DAILY (Patient not taking: Reported on 02/27/2022) 90 tablet 3   No current facility-administered medications on file prior to visit.     The following portions of the patient's history were reviewed and updated as appropriate: allergies, current medications, past family history, past medical history, past social history, past surgical history and problem list.  ROS Otherwise as in subjective above   Objective: BP (!) 160/92  Pulse 64   Temp 98.5 F (36.9 C)   Wt 194 lb 6.4 oz (88.2 kg)   LMP 03/23/2017   BMI 33.37 kg/m   Wt Readings from Last 3 Encounters:  02/27/22 194 lb 6.4 oz (88.2 kg)  02/19/22 193 lb 12.8 oz (87.9 kg)  07/11/21 183 lb (83 kg)   BP Readings from Last 3 Encounters:  02/27/22 (!) 160/92  02/19/22 (!) 150/90  03/19/21 124/80   General appearance: alert, no distress, well developed, well nourished Otherwise not examined    Assessment: Encounter Diagnoses  Name Primary?   Hypokalemia Yes   Type 2 diabetes mellitus with neurological complications (HCC)    Hyperlipidemia, unspecified hyperlipidemia type    Hypothyroidism, unspecified type    IgA nephropathy    Language barrier    Essential hypertension      Plan: We reviewed back  over her medications again as well as her lab results.  Her labs from last visit a week ago showed worsening kidney marker and her potassium and phosphorus were elevated.  She apparently did not pick up losartan nor Kerendia from the pharmacy last week.  I went back over the recommendations again and printed the instructions again for her.  We also called the pharmacy and they have the losartan and Kerendia ready for her.  I asked her to come in for lab in 2 weeks, and f/u with nephology in September as planned.   Medications  Levothyroxine thyroid medicine 25 mcg daily every morning 30 to 45 minutes before breakfast or any other medication  Morning medications: Levothyroxine 25 mcg daily Amlodipine 10 mg blood pressure medication Losartan /Cozaar 100 mg blood pressure medication Furosemide Lasix 40 mg fluid medication/blood pressure medication Calcitriol 0.25 mcg capsule Sodium bicarbonate 650 mg tablet Kerendia 10 mg tablet    Afternoon medications: Furosemide Lasix 40 mg fluid medication/blood pressure medication Sodium bicarbonate 650 mg tablet Rosuvastatin Crestor 40 mg cholesterol tablet   Check your blood pressures regularly and write these down.  Goal is 130/80 or less.  After 2 weeks of being back on the medications above, if blood pressures are consistently greater than 130/80, then add back metoprolol 25 mg daily  Follow-up with kidney doctor in September as planned   Obdulia was seen today for other.  Diagnoses and all orders for this visit:  Hypokalemia -     Basic metabolic panel; Future  Type 2 diabetes mellitus with neurological complications (HCC)  Hyperlipidemia, unspecified hyperlipidemia type  Hypothyroidism, unspecified type  IgA nephropathy  Language barrier  Essential hypertension  Other orders -     Discontinue: sodium bicarbonate 650 MG tablet; Take 1 tablet (650 mg total) by mouth 2 (two) times daily. -     Discontinue: furosemide (LASIX)  40 MG tablet; Take 1 tablet (40 mg total) by mouth 2 (two) times daily. -     sodium bicarbonate 650 MG tablet; Take 1 tablet (650 mg total) by mouth 2 (two) times daily. -     furosemide (LASIX) 40 MG tablet; Take 1 tablet (40 mg total) by mouth 2 (two) times daily.   Follow up: 2wk for lab

## 2022-02-27 NOTE — Patient Instructions (Addendum)
Medications  Levothyroxine thyroid medicine 25 mcg daily every morning 30 to 45 minutes before breakfast or any other medication  Morning medications: Levothyroxine 25 mcg daily Amlodipine 10 mg blood pressure medication Losartan /Cozaar 100 mg blood pressure medication Furosemide Lasix 40 mg fluid medication/blood pressure medication Calcitriol 0.25 mcg capsule Sodium bicarbonate 650 mg tablet Kerendia 10 mg tablet    Afternoon medications: Furosemide Lasix 40 mg fluid medication/blood pressure medication Sodium bicarbonate 650 mg tablet Rosuvastatin Crestor 40 mg cholesterol tablet   Check your blood pressures regularly and write these down.  Goal is 130/80 or less.  After 2 weeks of being back on the medications above, if blood pressures are consistently greater than 130/80, then add back metoprolol 25 mg daily  Follow-up with kidney doctor in September as planned   Return here in 2 weeks after starting back on the regimen above to recheck the kidney marker and potassium.  This is a nurse visit.  She would not see me on that visit.

## 2022-03-17 ENCOUNTER — Ambulatory Visit: Payer: Medicaid Other | Admitting: Medical

## 2022-03-19 ENCOUNTER — Ambulatory Visit: Payer: Medicaid Other | Admitting: Medical

## 2022-03-19 VITALS — BP 140/82 | HR 65 | Wt 191.8 lb

## 2022-03-19 DIAGNOSIS — E039 Hypothyroidism, unspecified: Secondary | ICD-10-CM

## 2022-03-19 DIAGNOSIS — R609 Edema, unspecified: Secondary | ICD-10-CM

## 2022-03-19 DIAGNOSIS — Z789 Other specified health status: Secondary | ICD-10-CM

## 2022-03-19 DIAGNOSIS — I10 Essential (primary) hypertension: Secondary | ICD-10-CM | POA: Diagnosis not present

## 2022-03-19 DIAGNOSIS — N028 Recurrent and persistent hematuria with other morphologic changes: Secondary | ICD-10-CM

## 2022-03-19 DIAGNOSIS — E1149 Type 2 diabetes mellitus with other diabetic neurological complication: Secondary | ICD-10-CM

## 2022-03-19 DIAGNOSIS — E785 Hyperlipidemia, unspecified: Secondary | ICD-10-CM

## 2022-03-19 DIAGNOSIS — E876 Hypokalemia: Secondary | ICD-10-CM

## 2022-03-19 DIAGNOSIS — Z79899 Other long term (current) drug therapy: Secondary | ICD-10-CM

## 2022-03-19 DIAGNOSIS — M1711 Unilateral primary osteoarthritis, right knee: Secondary | ICD-10-CM
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
## 2022-03-20 LAB — BASIC METABOLIC PANEL
BUN/Creatinine Ratio: 13 (ref 9–23)
BUN: 43 mg/dL — ABNORMAL HIGH (ref 6–24)
CO2: 19 mmol/L — ABNORMAL LOW (ref 20–29)
Calcium: 9.2 mg/dL (ref 8.7–10.2)
Chloride: 100 mmol/L (ref 96–106)
Creatinine, Ser: 3.27 mg/dL — ABNORMAL HIGH (ref 0.57–1.00)
Glucose: 135 mg/dL — ABNORMAL HIGH (ref 70–99)
Potassium: 4.3 mmol/L (ref 3.5–5.2)
Sodium: 138 mmol/L (ref 134–144)
eGFR: 17 mL/min/{1.73_m2} — ABNORMAL LOW (ref >59)

## 2022-04-14 ENCOUNTER — Telehealth: Payer: Self-pay

## 2022-04-14 NOTE — Telephone Encounter (Signed)
A representative from [facility] called to let us know that they could not get an translator for her mammogram on 04/16/22, so they are going to have to cancel her apt. They wanted to let you know so you could refer her to a different facility for her mammogram.

## 2022-04-15 ENCOUNTER — Other Ambulatory Visit: Payer: Self-pay

## 2022-04-15 DIAGNOSIS — Z1231 Encounter for screening mammogram for malignant neoplasm of breast: Secondary | ICD-10-CM

## 2022-04-15 LAB — HM COLONOSCOPY

## 2022-04-16 ENCOUNTER — Ambulatory Visit: Payer: Medicaid Other

## 2022-04-22 ENCOUNTER — Telehealth: Payer: Medicaid Other | Admitting: Physician Assistant

## 2022-04-22 ENCOUNTER — Encounter: Payer: Self-pay | Admitting: Internal Medicine

## 2022-04-22 DIAGNOSIS — R899 Unspecified abnormal finding in specimens from other organs, systems and tissues: Secondary | ICD-10-CM

## 2022-04-22 NOTE — Progress Notes (Signed)
Patient and family asking questions about results from Nephrologist and colonoscopy results from Primary care provider. Colonoscopy done at outside facility and has not been scanned into chart yet. As such they were instructed to contact PCP directly and to contact nephrologist as we are a virtual urgent care meant for simple acute illnesses. Message sent directly to PCP to make him aware.

## 2022-04-24 ENCOUNTER — Encounter: Payer: Self-pay | Admitting: Medical

## 2022-05-13 ENCOUNTER — Encounter: Payer: Self-pay | Admitting: Internal Medicine

## 2022-06-04 ENCOUNTER — Other Ambulatory Visit: Payer: Self-pay | Admitting: Medical

## 2022-06-19 LAB — HM MAMMOGRAPHY

## 2022-06-23 ENCOUNTER — Other Ambulatory Visit (HOSPITAL_COMMUNITY)
Admission: RE | Admit: 2022-06-23 | Discharge: 2022-06-23 | Disposition: A | Discharge status: Home / Self Care | Payer: Medicaid Other | Source: Ambulatory Visit | Attending: Medical | Admitting: Medical

## 2022-06-23 ENCOUNTER — Ambulatory Visit (INDEPENDENT_AMBULATORY_CARE_PROVIDER_SITE_OTHER): Payer: Medicaid Other | Admitting: Medical

## 2022-06-23 VITALS — BP 120/88 | HR 72 | Wt 196.2 lb

## 2022-06-23 DIAGNOSIS — E785 Hyperlipidemia, unspecified: Secondary | ICD-10-CM

## 2022-06-23 DIAGNOSIS — N184 Chronic kidney disease, stage 4 (severe): Secondary | ICD-10-CM

## 2022-06-23 DIAGNOSIS — Z124 Encounter for screening for malignant neoplasm of cervix: Secondary | ICD-10-CM | POA: Insufficient documentation

## 2022-06-23 DIAGNOSIS — N02B9 Other recurrent and persistent immunoglobulin A nephropathy: Secondary | ICD-10-CM | POA: Diagnosis not present

## 2022-06-23 DIAGNOSIS — E1149 Type 2 diabetes mellitus with other diabetic neurological complication: Secondary | ICD-10-CM

## 2022-06-23 DIAGNOSIS — Z7185 Encounter for immunization safety counseling: Secondary | ICD-10-CM

## 2022-06-23 DIAGNOSIS — Z23 Encounter for immunization: Secondary | ICD-10-CM | POA: Diagnosis not present

## 2022-06-23 DIAGNOSIS — I10 Essential (primary) hypertension: Secondary | ICD-10-CM | POA: Diagnosis not present

## 2022-06-23 NOTE — Progress Notes (Signed)
Subjective:  Linda Black is a 51 y.o. female who presents for Chief Complaint  Patient presents with   4 month follow-up    4 month follow-up med check. Needs referral to kidney specialist- , will schedule an appointment for pap smear another visit     Here for chronic disease follow up.   Has been seeing kidney specialists at [facility] but recently was referred by [facility] to [facility] given her worsening kidney function.  Will need to start looking towards dialysis and transplant.    Given the transplant team involvement now she needs to have certain things updated.  She just had a mammogram last month.  She needs her Pap smear updated.  Last period 4-5 years ago.   Divorced, not sexually active in a while.  No concern for STD.  No pelvic pain or pelvic concerns.  Compliant with medicaiton.    She recently established with [facility] and has had some initial workup and bunch of labs.   No other aggravating or relieving factors.    No other c/o.  Past Medical History:  Diagnosis Date   Diabetes mellitus without complication ([place])    Dr. Kelton Pillar   Fatty liver 2019   elevated LFTs, negative for Hep B and Hep C 04/2018   GERD (gastroesophageal reflux disease)    Hepatitis    Hyperlipidemia    Hypertension    Hypothyroidism 2019   IgA nephropathy 2019   per biopsy, Dr. Corliss Parish   Language barrier    Lumbar stenosis    Dr. Louanne Skye   Obesity    Osteoarthritis    Proteinuria    Current Outpatient Medications on File Prior to Visit  Medication Sig Dispense Refill   amLODipine (NORVASC) 10 MG tablet Take 1 tablet (10 mg total) by mouth daily. 90 tablet 3   calcitRIOL (ROCALTROL) 0.25 MCG capsule Take 1 capsule (0.25 mcg total) by mouth daily. 90 capsule 3   furosemide (LASIX) 40 MG tablet Take 1 tablet (40 mg total) by mouth 2 (two) times daily. 180 tablet 3   levothyroxine (SYNTHROID) 25 MCG tablet TAKE 1 TABLET(25 MCG) BY MOUTH DAILY BEFORE  BREAKFAST 90 tablet 0   losartan (COZAAR) 100 MG tablet TAKE 1 TABLET(100 MG) BY MOUTH DAILY 90 tablet 3   metoprolol succinate (TOPROL-XL) 25 MG 24 hr tablet Take 25 mg by mouth daily.     rosuvastatin (CRESTOR) 40 MG tablet Take 1 tablet (40 mg total) by mouth daily. 90 tablet 3   Accu-Chek Softclix Lancets lancets Use as instructed 100 each 12   glucose blood (ACCU-CHEK AVIVA PLUS) test strip Test daily 100 strip 3   No current facility-administered medications on file prior to visit.     The following portions of the patient's history were reviewed and updated as appropriate: allergies, current medications, past family history, past medical history, past social history, past surgical history and problem list.  ROS Otherwise as in subjective above  Objective: BP 120/88   Pulse 72   Wt 196 lb 3.2 oz (89 kg)   LMP 03/23/2017   BMI 33.68 kg/m   General appearance: alert, no distress, well developed, well nourished Gyn: Normal external genitalia without lesions, vagina with normal mucosa, cervix without lesions, no cervical motion tenderness, no abnormal vaginal discharge.  Uterus and adnexa not enlarged, nontender, no masses.  Pap performed.  Exam chaperoned by nurse.      Assessment: Encounter Diagnoses  Name Primary?  IgA nephropathy Yes   Screening for cervical cancer    Type 2 diabetes mellitus with neurological complications (HCC)    Essential hypertension    Hyperlipidemia, unspecified hyperlipidemia type    Vaccine counseling    Need for influenza vaccination    CKD (chronic kidney disease) stage 4, GFR 15-29 ml/min (HCC)      Plan: IgA nephropathy, CKD 4- recently established with [facility] transplant team, also seeing [facility], consider dialysis now.   Does not have shunt/dialysis access at this point. F/u with [facility] transplant team and [facility] as planned.  See long list of labs done by [facility] 06/2022 in [place].  Pap  today  Diabetes - diet controlled, most recent HgbA1C under 7%, 06/09/22  HTN - controlled, continue current medication  Counseled on the influenza virus vaccine.  Vaccine information sheet given.  Influenza vaccine given after consent obtained.  Can return in a few weeks for updated Pneumococcal 23 vaccine  Hyperlipidemia - continue current statin  Mccall was seen today for 4 month follow-up.  Diagnoses and all orders for this visit:  IgA nephropathy  Screening for cervical cancer -     Cytology - PAP([place])  Type 2 diabetes mellitus with neurological complications ([place])  Essential hypertension  Hyperlipidemia, unspecified hyperlipidemia type  Vaccine counseling  Need for influenza vaccination -     Flu Vaccine QUAD 22mo+IM (Fluarix, Fluzone & Alfiuria Quad PF)  CKD (chronic kidney disease) stage 4, GFR 15-29 ml/min (HCC)    Follow up: pending pap

## 2022-06-24 ENCOUNTER — Encounter: Payer: Self-pay | Admitting: Internal Medicine

## 2022-06-25 LAB — CYTOLOGY - PAP
Chlamydia: NEGATIVE
Comment: NEGATIVE
Comment: NEGATIVE
Comment: NORMAL
Diagnosis: NEGATIVE
High risk HPV: NEGATIVE
Neisseria Gonorrhea: NEGATIVE

## 2022-07-07 ENCOUNTER — Other Ambulatory Visit: Payer: Medicaid Other

## 2022-08-07 ENCOUNTER — Other Ambulatory Visit: Payer: Self-pay | Admitting: *Deleted

## 2022-08-07 DIAGNOSIS — N189 Chronic kidney disease, unspecified: Secondary | ICD-10-CM

## 2022-08-14 ENCOUNTER — Ambulatory Visit (INDEPENDENT_AMBULATORY_CARE_PROVIDER_SITE_OTHER)
Admission: RE | Admit: 2022-08-14 | Discharge: 2022-08-14 | Disposition: A | Discharge status: Home / Self Care | Payer: Medicaid Other | Source: Ambulatory Visit | Attending: Vascular Surgery | Admitting: Vascular Surgery

## 2022-08-14 ENCOUNTER — Ambulatory Visit (HOSPITAL_COMMUNITY)
Admission: RE | Admit: 2022-08-14 | Discharge: 2022-08-14 | Disposition: A | Discharge status: Home / Self Care | Payer: Medicaid Other | Source: Ambulatory Visit | Attending: Vascular Surgery | Admitting: Vascular Surgery

## 2022-08-14 ENCOUNTER — Other Ambulatory Visit: Payer: Self-pay

## 2022-08-14 ENCOUNTER — Ambulatory Visit (INDEPENDENT_AMBULATORY_CARE_PROVIDER_SITE_OTHER): Payer: Medicaid Other | Admitting: Vascular Surgery

## 2022-08-14 ENCOUNTER — Encounter: Payer: Self-pay | Admitting: Vascular Surgery

## 2022-08-14 VITALS — BP 172/97 | HR 59 | Temp 98.4°F | Resp 20 | Ht 64.0 in | Wt 195.0 lb

## 2022-08-14 DIAGNOSIS — N179 Acute kidney failure, unspecified: Secondary | ICD-10-CM

## 2022-08-14 DIAGNOSIS — N189 Chronic kidney disease, unspecified: Secondary | ICD-10-CM

## 2022-08-14 DIAGNOSIS — N185 Chronic kidney disease, stage 5: Secondary | ICD-10-CM | POA: Diagnosis not present

## 2022-08-14 NOTE — Progress Notes (Signed)
ASSESSMENT & PLAN   STAGE V CHRONIC KIDNEY DISEASE: Based on her vein map she may potentially be a candidate for a left radiocephalic fistula or left basilic vein transposition.  If neither are adequate we will place an AV graft.  In addition you been asked to place a catheter.  Through the translator I have discussed the indications for the procedure and the distal complications including but not limited to bleeding, wound healing problems, and steal syndrome.  We have tried to schedule this for the 16th but she has another appointment that day.  I have therefore schedule her for 08/26/2022.  HYPERTENSION: The patient's initial blood pressure today was elevated. We repeated this and this was still elevated. We have encouraged the patient to follow up with their primary care physician for management of their blood pressure.  REASON FOR CONSULT:    For placement of a tunneled dialysis catheter and hemodialysis access.  The consult is requested by Dr. Moshe Cipro.  HPI:   Linda Black is a 52 y.o. female who is referred for evaluation for hemodialysis access.  I have reviewed the records from the referring office.  The patient has a history of IgA nephropathy.  We have been asked to place a catheter and also access.  She is right-handed.  Her blood pressure has been poorly controlled.  On my history the patient does describe some shortness of breath and leg swelling.  She has had no other uremic symptoms that she is aware of.  She has not had a pacemaker or previous catheters.  She is not on any blood thinners.  She is right-handed.  Past Medical History:  Diagnosis Date   Diabetes mellitus without complication ([place])    Dr. Kelton Pillar   Fatty liver 2019   elevated LFTs, negative for Hep B and Hep C 04/2018   GERD (gastroesophageal reflux disease)    Hepatitis    Hyperlipidemia    Hypertension    Hypothyroidism 2019   IgA nephropathy 2019   per biopsy, Dr. Corliss Parish    Language barrier    Lumbar stenosis    Dr. Louanne Skye   Obesity    Osteoarthritis    Proteinuria     Family History  Problem Relation Age of Onset   Diabetes Mother    Hypertension Father    Colon cancer Neg Hx    Stomach cancer Neg Hx    Esophageal cancer Neg Hx    Rectal cancer Neg Hx    Liver cancer Neg Hx     SOCIAL HISTORY: Social History   Tobacco Use   Smoking status: Never   Smokeless tobacco: Never  Substance Use Topics   Alcohol use: Never    Allergies  Allergen Reactions   Lasix [Furosemide] Shortness Of Breath and Swelling    SOB, facial swelling   Lisinopril Swelling    Angioedema    Covid-19 (Mrna) Vaccine     Got quite sick after both initial doses   Other Swelling    Barnabas Lister Fruit    Current Outpatient Medications  Medication Sig Dispense Refill   Accu-Chek Softclix Lancets lancets Use as instructed 100 each 12   amLODipine (NORVASC) 10 MG tablet Take 1 tablet (10 mg total) by mouth daily. 90 tablet 3   calcitRIOL (ROCALTROL) 0.25 MCG capsule Take 1 capsule (0.25 mcg total) by mouth daily. 90 capsule 3   furosemide (LASIX) 40 MG tablet Take 1 tablet (40 mg total) by mouth 2 (two) times  daily. 180 tablet 3   glucose blood (ACCU-CHEK AVIVA PLUS) test strip Test daily 100 strip 3   levothyroxine (SYNTHROID) 25 MCG tablet TAKE 1 TABLET(25 MCG) BY MOUTH DAILY BEFORE BREAKFAST 90 tablet 0   losartan (COZAAR) 100 MG tablet TAKE 1 TABLET(100 MG) BY MOUTH DAILY 90 tablet 3   metoprolol succinate (TOPROL-XL) 25 MG 24 hr tablet Take 25 mg by mouth daily.     rosuvastatin (CRESTOR) 40 MG tablet Take 1 tablet (40 mg total) by mouth daily. 90 tablet 3   No current facility-administered medications for this visit.    REVIEW OF SYSTEMS:  [X]  denotes positive finding, [ ]  denotes negative finding Cardiac  Comments:  Chest pain or chest pressure: x   Shortness of breath upon exertion: x   Short of breath when lying flat: x   Irregular heart rhythm: x        Vascular    Pain in calf, thigh, or hip brought on by ambulation:    Pain in feet at night that wakes you up from your sleep:     Blood clot in your veins:    Leg swelling:  x       Pulmonary    Oxygen at home:    Productive cough:     Wheezing:         Neurologic    Sudden weakness in arms or legs:     Sudden numbness in arms or legs:     Sudden onset of difficulty speaking or slurred speech:    Temporary loss of vision in one eye:     Problems with dizziness:  x       Gastrointestinal    Blood in stool:     Vomited blood:         Genitourinary    Burning when urinating:  x   Blood in urine:        Psychiatric    Major depression:         Hematologic    Bleeding problems:    Problems with blood clotting too easily:        Skin    Rashes or ulcers:        Constitutional    Fever or chills:    -  PHYSICAL EXAM:   Vitals:   08/14/22 0854  BP: (!) 172/97  Pulse: (!) 59  Resp: 20  Temp: 98.4 F (36.9 C)  SpO2: 99%  Weight: 195 lb (88.5 kg)  Height: 5\' 4"  (1.626 m)   Body mass index is 33.47 kg/m. GENERAL: The patient is a well-nourished female, in no acute distress. The vital signs are documented above. CARDIAC: There is a regular rate and rhythm.  VASCULAR: I do not detect carotid bruits. She has palpable radial pulses. She has mild bilateral lower extremity swelling. PULMONARY: There is good air exchange bilaterally without wheezing or rales. ABDOMEN: Soft and non-tender with normal pitched bowel sounds.  MUSCULOSKELETAL: There are no major deformities. NEUROLOGIC: No focal weakness or paresthesias are detected. SKIN: There are no ulcers or rashes noted. PSYCHIATRIC: The patient has a normal affect.  DATA:    BILATERAL UPPER EXTREMITY VEIN MAP.  I have independently interpreted her upper extremity vein map.  On the right side, the forearm and upper arm cephalic vein do not appear adequate.  The basilic vein on the right looks reasonable in  size.  On the left side the forearm cephalic vein looks marginal in size.  The  upper arm cephalic vein narrows down to 1.6 mm.  In the forearm the diameters range from 3.1-3.8 mm.  The basilic vein on the left looks reasonable in size.  UPPER EXTREMITY ARTERIAL DUPLEX: I have independently interpreted her extremity arterial duplex.  On the right side there is a triphasic radial and ulnar waveform.  Brachial artery measures 4.3 mm in diameter.  On the left side the brachial artery measures 4.8 mm in diameter.  There is a triphasic radial and ulnar waveform.   Deitra Mayo [facility]

## 2022-08-25 ENCOUNTER — Telehealth: Payer: Self-pay

## 2022-08-25 ENCOUNTER — Encounter (HOSPITAL_COMMUNITY): Payer: Self-pay | Admitting: Physician Assistant

## 2022-08-25 ENCOUNTER — Encounter (HOSPITAL_COMMUNITY): Payer: Self-pay | Admitting: Vascular Surgery

## 2022-08-25 NOTE — Progress Notes (Signed)
Used Daughter Yui Mulvaney [phone]) for information and instructions for DOS.  Language Line stated that they did not have an interpreter for the [place] language.  PCP - Chana Bode, PA-C Cardiologist - n/a Endocrinology - Dr Vivia Ewing  Chest x-ray - n/a EKG - 06/15/22 CE-Requested Stress Test - 08/19/22 CE ECHO - 08/19/22 CE Cardiac Cath - n/a  ICD Pacemaker/Loop - n/a  Sleep Study -  n/a CPAP - none  Anesthesia review: Yes  STOP now taking any Aspirin (unless otherwise instructed by your surgeon), Aleve, Naproxen, Ibuprofen, Motrin, Advil, Goody's, BC's, all herbal medications, fish oil, and all vitamins.   Diabetes Type 2, no meds, diet controlled  Coronavirus Screening Does the patient have any of the following symptoms:  Cough yes/no: No Fever (>100.60F)  yes/no: No Runny nose yes/no: No Sore throat yes/no: No Difficulty breathing/shortness of breath  Yes-some  Has the patient traveled in the last 14 days and where? yes/no: No  Daughter Hyacinth Marcelli verbalized understanding of the information and instructions that were given to her via phone for her mother's surgery.

## 2022-08-25 NOTE — Telephone Encounter (Signed)
Telephone call received from Karoline Caldwell, PA-C at [facility] preadmission advising that patient had cardiac testing for kidney transplant workup and one of her testing on 08/20/22 indicated positive for ischemia. Per Dr. Scot Dock surgery will need to be cancelled until cleared by cardiology.   Spoke with patient's daughter Nikola Blackston regarding the above. She verbalized understanding and will have patient follow up with cardiology at [facility].

## 2022-08-26 ENCOUNTER — Ambulatory Visit (HOSPITAL_COMMUNITY): Admission: RE | Admit: 2022-08-26 | Payer: Medicaid Other | Source: Home / Self Care | Admitting: Vascular Surgery

## 2022-08-26 SURGERY — ARTERIOVENOUS (AV) FISTULA CREATION
Anesthesia: Choice

## 2022-09-09 ENCOUNTER — Other Ambulatory Visit: Payer: Self-pay | Admitting: Medical

## 2022-09-30 ENCOUNTER — Other Ambulatory Visit: Payer: Self-pay

## 2022-09-30 ENCOUNTER — Telehealth: Payer: Self-pay

## 2022-09-30 DIAGNOSIS — N185 Chronic kidney disease, stage 5: Secondary | ICD-10-CM

## 2022-09-30 NOTE — Telephone Encounter (Signed)
Received a call from [place] office requesting our office to contact patient to reschedule surgery left arm AVF/AVG due to patient has been seen by cardiology and cleared with testing. Stated, patient no longer needs TDC placed per Dr. Moshe Cipro.    Spoke with patient's daughter Linda Black. Rescheduled surgery for 10/13/22. Instructions provided- Daughter verbalized understanding.

## 2022-10-07 NOTE — Addendum Note (Signed)
Addended by: Nicholas Lose on: 10/07/2022 08:49 AM   Modules accepted: Orders

## 2022-10-10 ENCOUNTER — Other Ambulatory Visit: Payer: Self-pay

## 2022-10-10 ENCOUNTER — Encounter (HOSPITAL_COMMUNITY): Payer: Self-pay | Admitting: Vascular Surgery

## 2022-10-10 NOTE — Progress Notes (Signed)
I spoke with Linda Black using Linda Black, Linda Black Interpreter..  Linda Black denies chest pain or shortness of breath. Patient denies having any s/s of Covid in her household, also denies any known exposure to Covid. Linda Black denies any s/s of upper or lower respiratory infection in the past 8 weeks.  Linda Black PCP is Chana Bode, endrocrinologist is Dr. Orson Slick.  Linda Black has Type II, she is diet controlled and does not check CBG's

## 2022-10-13 ENCOUNTER — Ambulatory Visit (HOSPITAL_COMMUNITY): Payer: Medicaid Other | Admitting: Anesthesiology

## 2022-10-13 ENCOUNTER — Ambulatory Visit (HOSPITAL_BASED_OUTPATIENT_CLINIC_OR_DEPARTMENT_OTHER): Payer: Medicaid Other | Admitting: Anesthesiology

## 2022-10-13 ENCOUNTER — Other Ambulatory Visit: Payer: Self-pay

## 2022-10-13 ENCOUNTER — Ambulatory Visit (HOSPITAL_COMMUNITY)
Admission: RE | Admit: 2022-10-13 | Discharge: 2022-10-13 | Disposition: A | Discharge status: Home / Self Care | Payer: Medicaid Other | Attending: Vascular Surgery | Admitting: Vascular Surgery

## 2022-10-13 ENCOUNTER — Encounter (HOSPITAL_COMMUNITY): Payer: Self-pay | Admitting: Vascular Surgery

## 2022-10-13 ENCOUNTER — Encounter (HOSPITAL_COMMUNITY)
Admission: RE | Disposition: A | Discharge status: Home / Self Care | Payer: Self-pay | Source: Home / Self Care | Attending: Vascular Surgery

## 2022-10-13 DIAGNOSIS — E039 Hypothyroidism, unspecified: Secondary | ICD-10-CM

## 2022-10-13 DIAGNOSIS — N186 End stage renal disease: Secondary | ICD-10-CM | POA: Diagnosis not present

## 2022-10-13 DIAGNOSIS — N185 Chronic kidney disease, stage 5: Secondary | ICD-10-CM | POA: Diagnosis not present

## 2022-10-13 DIAGNOSIS — Z6832 Body mass index (BMI) 32.0-32.9, adult: Secondary | ICD-10-CM | POA: Insufficient documentation

## 2022-10-13 DIAGNOSIS — E669 Obesity, unspecified: Secondary | ICD-10-CM | POA: Insufficient documentation

## 2022-10-13 DIAGNOSIS — D638 Anemia in other chronic diseases classified elsewhere: Secondary | ICD-10-CM

## 2022-10-13 DIAGNOSIS — Z992 Dependence on renal dialysis: Secondary | ICD-10-CM | POA: Diagnosis not present

## 2022-10-13 DIAGNOSIS — I120 Hypertensive chronic kidney disease with stage 5 chronic kidney disease or end stage renal disease: Secondary | ICD-10-CM | POA: Insufficient documentation

## 2022-10-13 DIAGNOSIS — Z8249 Family history of ischemic heart disease and other diseases of the circulatory system: Secondary | ICD-10-CM | POA: Diagnosis not present

## 2022-10-13 DIAGNOSIS — E1122 Type 2 diabetes mellitus with diabetic chronic kidney disease: Secondary | ICD-10-CM | POA: Diagnosis not present

## 2022-10-13 DIAGNOSIS — Z794 Long term (current) use of insulin: Secondary | ICD-10-CM

## 2022-10-13 DIAGNOSIS — D631 Anemia in chronic kidney disease: Secondary | ICD-10-CM | POA: Diagnosis not present

## 2022-10-13 HISTORY — DX: Cardiac murmur, unspecified: R01.1

## 2022-10-13 HISTORY — PX: AV FISTULA PLACEMENT: SHX1204

## 2022-10-13 HISTORY — DX: Anxiety disorder, unspecified: F41.9

## 2022-10-13 LAB — POCT I-STAT, CHEM 8
BUN: 36 mg/dL — ABNORMAL HIGH (ref 6–20)
Calcium, Ion: 1.02 mmol/L — ABNORMAL LOW (ref 1.15–1.40)
Chloride: 116 mmol/L — ABNORMAL HIGH (ref 98–111)
Creatinine, Ser: 4.3 mg/dL — ABNORMAL HIGH (ref 0.44–1.00)
Glucose, Bld: 90 mg/dL (ref 70–99)
HCT: 31 % — ABNORMAL LOW (ref 36.0–46.0)
Hemoglobin: 10.5 g/dL — ABNORMAL LOW (ref 12.0–15.0)
Potassium: 4.3 mmol/L (ref 3.5–5.1)
Sodium: 141 mmol/L (ref 135–145)
TCO2: 17 mmol/L — ABNORMAL LOW (ref 22–32)

## 2022-10-13 LAB — GLUCOSE, CAPILLARY
Glucose-Capillary: 87 mg/dL (ref 70–99)
Glucose-Capillary: 94 mg/dL (ref 70–99)
Glucose-Capillary: 90 mg/dL (ref 70–99)

## 2022-10-13 SURGERY — ARTERIOVENOUS (AV) FISTULA CREATION
Anesthesia: General | Laterality: Left

## 2022-10-13 MED ORDER — AMISULPRIDE (ANTIEMETIC) 5 MG/2ML IV SOLN: 10.0000 mg | Freq: Once | INTRAVENOUS | Status: DC | PRN

## 2022-10-13 MED ORDER — PHENYLEPHRINE 80 MCG/ML (10ML) SYRINGE FOR IV PUSH (FOR BLOOD PRESSURE SUPPORT)
PREFILLED_SYRINGE | INTRAVENOUS | Status: DC | PRN
  Administered 2022-10-13 (×2): 80 ug via INTRAVENOUS
  Administered 2022-10-13: 160 ug via INTRAVENOUS
  Administered 2022-10-13: 80 ug via INTRAVENOUS
  Administered 2022-10-13: 160 ug via INTRAVENOUS
  Administered 2022-10-13: 80 ug via INTRAVENOUS
  Administered 2022-10-13: 160 ug via INTRAVENOUS

## 2022-10-13 MED ORDER — PROMETHAZINE HCL 25 MG/ML IJ SOLN: 6.2500 mg | INTRAMUSCULAR | Status: DC | PRN

## 2022-10-13 MED ORDER — DEXAMETHASONE SODIUM PHOSPHATE 10 MG/ML IJ SOLN
INTRAMUSCULAR | Status: DC | PRN
  Administered 2022-10-13: 5 mg via INTRAVENOUS

## 2022-10-13 MED ORDER — CHLORHEXIDINE GLUCONATE 4 % EX LIQD: 60.0000 mL | Freq: Once | CUTANEOUS | Status: DC

## 2022-10-13 MED ORDER — OXYCODONE HCL 5 MG PO TABS: 5.0000 mg | ORAL_TABLET | Freq: Once | ORAL | Status: DC | PRN

## 2022-10-13 MED ORDER — HYDROMORPHONE HCL 1 MG/ML IJ SOLN: 0.2500 mg | INTRAMUSCULAR | Status: DC | PRN

## 2022-10-13 MED ORDER — LIDOCAINE-EPINEPHRINE (PF) 1 %-1:200000 IJ SOLN
INTRAMUSCULAR | Status: DC | PRN
  Administered 2022-10-13: 10 mL

## 2022-10-13 MED ORDER — CEFAZOLIN SODIUM-DEXTROSE 2-4 GM/100ML-% IV SOLN
2.0000 g | INTRAVENOUS | Status: AC
  Administered 2022-10-13: 2 g via INTRAVENOUS
  Filled 2022-10-13: qty 100

## 2022-10-13 MED ORDER — 0.9 % SODIUM CHLORIDE (POUR BTL) OPTIME
TOPICAL | Status: DC | PRN
  Administered 2022-10-13: 1000 mL

## 2022-10-13 MED ORDER — TRAMADOL HCL 50 MG PO TABS: 50.0000 mg | ORAL_TABLET | Freq: Four times a day (QID) | ORAL | 0 refills | Status: DC | PRN

## 2022-10-13 MED ORDER — OXYCODONE HCL 5 MG/5ML PO SOLN: 5.0000 mg | Freq: Once | ORAL | Status: DC | PRN

## 2022-10-13 MED ORDER — HEPARIN 6000 UNIT IRRIGATION SOLUTION
Status: DC | PRN
  Administered 2022-10-13: 1

## 2022-10-13 MED ORDER — FENTANYL CITRATE (PF) 250 MCG/5ML IJ SOLN
INTRAMUSCULAR | Status: AC
  Filled 2022-10-13: qty 5

## 2022-10-13 MED ORDER — ONDANSETRON HCL 4 MG/2ML IJ SOLN
INTRAMUSCULAR | Status: DC | PRN
  Administered 2022-10-13: 4 mg via INTRAVENOUS

## 2022-10-13 MED ORDER — PROPOFOL 10 MG/ML IV BOLUS
INTRAVENOUS | Status: DC | PRN
  Administered 2022-10-13: 200 mg via INTRAVENOUS

## 2022-10-13 MED ORDER — MIDAZOLAM HCL 2 MG/2ML IJ SOLN
INTRAMUSCULAR | Status: DC | PRN
  Administered 2022-10-13: 2 mg via INTRAVENOUS

## 2022-10-13 MED ORDER — MIDAZOLAM HCL 2 MG/2ML IJ SOLN
INTRAMUSCULAR | Status: AC
  Filled 2022-10-13: qty 2

## 2022-10-13 MED ORDER — LIDOCAINE-EPINEPHRINE (PF) 1 %-1:200000 IJ SOLN
INTRAMUSCULAR | Status: AC
  Filled 2022-10-13: qty 30

## 2022-10-13 MED ORDER — CHLORHEXIDINE GLUCONATE 0.12 % MT SOLN
OROMUCOSAL | Status: AC
  Administered 2022-10-13: 15 mL
  Filled 2022-10-13: qty 15

## 2022-10-13 MED ORDER — SODIUM CHLORIDE 0.9 % IV SOLN: INTRAVENOUS | Status: DC

## 2022-10-13 MED ORDER — FENTANYL CITRATE (PF) 250 MCG/5ML IJ SOLN
INTRAMUSCULAR | Status: DC | PRN
  Administered 2022-10-13 (×3): 25 ug via INTRAVENOUS

## 2022-10-13 MED ORDER — HEPARIN 6000 UNIT IRRIGATION SOLUTION
Status: AC
  Filled 2022-10-13: qty 500

## 2022-10-13 MED ORDER — LIDOCAINE 2% (20 MG/ML) 5 ML SYRINGE
INTRAMUSCULAR | Status: DC | PRN
  Administered 2022-10-13: 60 mg via INTRAVENOUS

## 2022-10-13 SURGICAL SUPPLY — 45 items
APL PRP STRL LF DISP 70% ISPRP (MISCELLANEOUS) ×1
APL SKNCLS STERI-STRIP NONHPOA (GAUZE/BANDAGES/DRESSINGS) ×1
ARMBAND PINK RESTRICT EXTREMIT (MISCELLANEOUS) ×2 IMPLANT
BENZOIN TINCTURE PRP APPL 2/3 (GAUZE/BANDAGES/DRESSINGS) ×2 IMPLANT
CANISTER SUCT 3000ML PPV (MISCELLANEOUS) ×2 IMPLANT
CANNULA VESSEL 3MM 2 BLNT TIP (CANNULA) ×2 IMPLANT
CHLORAPREP W/TINT 26 (MISCELLANEOUS) ×2 IMPLANT
CLIP LIGATING EXTRA MED SLVR (CLIP) ×2 IMPLANT
CLIP LIGATING EXTRA SM BLUE (MISCELLANEOUS) ×2 IMPLANT
CLSR STERI-STRIP ANTIMIC 1/2X4 (GAUZE/BANDAGES/DRESSINGS) IMPLANT
COVER PROBE W GEL 5X96 (DRAPES) IMPLANT
DRSG TEGADERM 4X4.75 (GAUZE/BANDAGES/DRESSINGS) IMPLANT
ELECT REM PT RETURN 9FT ADLT (ELECTROSURGICAL) ×1
ELECTRODE REM PT RTRN 9FT ADLT (ELECTROSURGICAL) ×2 IMPLANT
GAUZE SPONGE 4X4 12PLY STRL (GAUZE/BANDAGES/DRESSINGS) IMPLANT
GLOVE BIO SURGEON STRL SZ 6.5 (GLOVE) IMPLANT
GLOVE BIO SURGEON STRL SZ8 (GLOVE) ×2 IMPLANT
GLOVE BIOGEL PI IND STRL 6.5 (GLOVE) IMPLANT
GLOVE BIOGEL PI IND STRL 7.5 (GLOVE) IMPLANT
GLOVE SURG SS PI 7.0 STRL IVOR (GLOVE) IMPLANT
GOWN STRL REUS W/ TWL LRG LVL3 (GOWN DISPOSABLE) ×4 IMPLANT
GOWN STRL REUS W/ TWL XL LVL3 (GOWN DISPOSABLE) ×2 IMPLANT
GOWN STRL REUS W/TWL LRG LVL3 (GOWN DISPOSABLE) ×2
GOWN STRL REUS W/TWL XL LVL3 (GOWN DISPOSABLE) ×1
INSERT FOGARTY SM (MISCELLANEOUS) IMPLANT
KIT BASIN OR (CUSTOM PROCEDURE TRAY) ×2 IMPLANT
KIT TURNOVER KIT B (KITS) ×2 IMPLANT
LOOP VASCULAR MINI 18 RED (MISCELLANEOUS) ×1
NDL 18GX1X1/2 (RX/OR ONLY) (NEEDLE) IMPLANT
NEEDLE 18GX1X1/2 (RX/OR ONLY) (NEEDLE) IMPLANT
NS IRRIG 1000ML POUR BTL (IV SOLUTION) ×2 IMPLANT
PACK CV ACCESS (CUSTOM PROCEDURE TRAY) ×2 IMPLANT
PAD ARMBOARD 7.5X6 YLW CONV (MISCELLANEOUS) ×4 IMPLANT
SLING ARM FOAM STRAP LRG (SOFTGOODS) IMPLANT
SLING ARM FOAM STRAP MED (SOFTGOODS) IMPLANT
STRIP CLOSURE SKIN 1/2X4 (GAUZE/BANDAGES/DRESSINGS) ×2 IMPLANT
SUT MNCRL AB 4-0 PS2 18 (SUTURE) ×2 IMPLANT
SUT PROLENE 6 0 BV (SUTURE) ×2 IMPLANT
SUT VIC AB 3-0 SH 27 (SUTURE) ×1
SUT VIC AB 3-0 SH 27X BRD (SUTURE) ×2 IMPLANT
SYR 3ML LL SCALE MARK (SYRINGE) IMPLANT
TOWEL GREEN STERILE (TOWEL DISPOSABLE) ×2 IMPLANT
UNDERPAD 30X36 HEAVY ABSORB (UNDERPADS AND DIAPERS) ×2 IMPLANT
VASCULAR TIE MINI RED 18IN STL (MISCELLANEOUS) IMPLANT
WATER STERILE IRR 1000ML POUR (IV SOLUTION) ×2 IMPLANT

## 2022-10-13 NOTE — Discharge Instructions (Signed)
   [facility]  Discharge Instructions  AV Fistula or Graft Surgery for Dialysis Access  Please refer to the following instructions for your post-procedure care. Your surgeon or physician assistant will discuss any changes with you.  Activity  You may drive the day following your surgery, if you are comfortable and no longer taking prescription pain medication. Resume full activity as the soreness in your incision resolves.  Bathing/Showering  You may shower after you go home. Keep your incision dry for 48 hours. Do not soak in a bathtub, hot tub, or swim until the incision heals completely. You may not shower if you have a hemodialysis catheter.  Incision Care  Clean your incision with mild soap and water after 48 hours. Pat the area dry with a clean towel. You do not need a bandage unless otherwise instructed. Do not apply any ointments or creams to your incision. You may have skin glue on your incision. Do not peel it off. It will come off on its own in about one week. Your arm may swell a bit after surgery. To reduce swelling use pillows to elevate your arm so it is above your heart. Your doctor will tell you if you need to lightly wrap your arm with an ACE bandage.  Diet  Resume your normal diet. There are not special food restrictions following this procedure. In order to heal from your surgery, it is CRITICAL to get adequate nutrition. Your body requires vitamins, minerals, and protein. Vegetables are the best source of vitamins and minerals. Vegetables also provide the perfect balance of protein. Processed food has little nutritional value, so try to avoid this.  Medications  Resume taking all of your medications. If your incision is causing pain, you may take over-the counter pain relievers such as acetaminophen (Tylenol). If you were prescribed a stronger pain medication, please be aware these medications can cause nausea and constipation. Prevent  nausea by taking the medication with a snack or meal. Avoid constipation by drinking plenty of fluids and eating foods with high amount of fiber, such as fruits, vegetables, and grains.  Do not take Tylenol if you are taking prescription pain medications.  Follow up Your surgeon may want to see you in the office following your access surgery. If so, this will be arranged at the time of your surgery.  Please call us immediately for any of the following conditions:  Increased pain, redness, drainage (pus) from your incision site Fever of 101 degrees or higher Severe or worsening pain at your incision site Hand pain or numbness.  Reduce your risk of vascular disease:  Stop smoking. If you would like help, call [facility] at 1-800-QUIT-NOW (308)737-8778) or [facility] [place] your cholesterol Maintain a desired weight Control your diabetes Keep your blood pressure down  Dialysis  It will take several weeks to several months for your new dialysis access to be ready for use. Your surgeon will determine when it is okay to use it. Your nephrologist will continue to direct your dialysis. You can continue to use your Permcath until your new access is ready for use.   10/13/2022 Linda Black [id number] 06-04-1971  Surgeon(s): Cherre Robins, MD  Procedure(s): Creation left 1st stage basilic vein transposition  x Do not stick fistula for 12 weeks    If you have any questions, please call the office at [phone].

## 2022-10-13 NOTE — H&P (Signed)
ASSESSMENT & PLAN   STAGE V CHRONIC KIDNEY DISEASE: Based on her vein map she may potentially be a candidate for a left radiocephalic fistula or left basilic vein transposition.  If neither are adequate we will place an AV graft.  In addition you been asked to place a catheter.  Through the translator I have discussed the indications for the procedure and the distal complications including but not limited to bleeding, wound healing problems, and steal syndrome.    HYPERTENSION: The patient's initial blood pressure today was elevated. We repeated this and this was still elevated. We have encouraged the patient to follow up with their primary care physician for management of their blood pressure.  REASON FOR CONSULT:    For placement of a tunneled dialysis catheter and hemodialysis access.  The consult is requested by Dr. Moshe Cipro.  HPI:   Linda Black is a 52 y.o. female who is referred for evaluation for hemodialysis access.  I have reviewed the records from the referring office.  The patient has a history of IgA nephropathy.  We have been asked to place a catheter and also access.  She is right-handed.  Her blood pressure has been poorly controlled.  On my history the patient does describe some shortness of breath and leg swelling.  She has had no other uremic symptoms that she is aware of.  She has not had a pacemaker or previous catheters.  She is not on any blood thinners.  She is right-handed.  Past Medical History:  Diagnosis Date   Anxiety    Diabetes mellitus without complication ([place])    Dr. Kelton Pillar - diet controlled type 2   Fatty liver 2019   elevated LFTs, negative for Hep B and Hep C 04/2018   GERD (gastroesophageal reflux disease)    Heart murmur    Hepatitis    Hyperlipidemia    Hypertension    Hypothyroidism 2019   IgA nephropathy 2019   per biopsy, Dr. Corliss Parish; Stage 4 ckd   Language barrier    Lumbar stenosis    Dr. Louanne Skye   Obesity     Osteoarthritis    Proteinuria     Family History  Problem Relation Age of Onset   Diabetes Mother    Hypertension Father    Colon cancer Neg Hx    Stomach cancer Neg Hx    Esophageal cancer Neg Hx    Rectal cancer Neg Hx    Liver cancer Neg Hx     SOCIAL HISTORY: Social History   Tobacco Use   Smoking status: Never   Smokeless tobacco: Never  Substance Use Topics   Alcohol use: Never    Allergies  Allergen Reactions   Lasix [Furosemide] Shortness Of Breath and Swelling    SOB, facial swelling   Lisinopril Swelling    Angioedema    Covid-19 (Mrna) Vaccine Hives    Got quite sick after both initial doses   Other Swelling    Barnabas Lister Fruit    Current Facility-Administered Medications  Medication Dose Route Frequency Provider Last Rate Last Admin   0.9 %  sodium chloride infusion   Intravenous Continuous Angelia Mould, MD 10 mL/hr at 10/13/22 0841 Continued from Pre-op at 10/13/22 0841   0.9 % irrigation (POUR BTL)    PRN Cherre Robins, MD   1,000 mL at 10/13/22 0843   ceFAZolin (ANCEF) IVPB 2g/100 mL premix  2 g Intravenous 30 min Pre-Op Angelia Mould, MD  chlorhexidine (HIBICLENS) 4 % liquid 4 Application  60 mL Topical Once Angelia Mould, MD       And   [START ON 10/14/2022] chlorhexidine (HIBICLENS) 4 % liquid 4 Application  60 mL Topical Once Angelia Mould, MD       heparin 6000 units / NS 500 mL irrigation    PRN Cherre Robins, MD   1 Application at [id number] [phone]    REVIEW OF SYSTEMS:  '[X]'$  denotes positive finding, '[ ]'$  denotes negative finding Cardiac  Comments:  Chest pain or chest pressure: x   Shortness of breath upon exertion: x   Short of breath when lying flat: x   Irregular heart rhythm: x       Vascular    Pain in calf, thigh, or hip brought on by ambulation:    Pain in feet at night that wakes you up from your sleep:     Blood clot in your veins:    Leg swelling:  x       Pulmonary    Oxygen at home:     Productive cough:     Wheezing:         Neurologic    Sudden weakness in arms or legs:     Sudden numbness in arms or legs:     Sudden onset of difficulty speaking or slurred speech:    Temporary loss of vision in one eye:     Problems with dizziness:  x       Gastrointestinal    Blood in stool:     Vomited blood:         Genitourinary    Burning when urinating:  x   Blood in urine:        Psychiatric    Major depression:         Hematologic    Bleeding problems:    Problems with blood clotting too easily:        Skin    Rashes or ulcers:        Constitutional    Fever or chills:    -  PHYSICAL EXAM:   Vitals:   10/13/22 0701 10/13/22 0746  BP: (!) 182/93 (!) 191/96  Pulse: 62 64  Resp: 18 17  Temp: 97.7 F (36.5 C)   TempSrc: Oral   SpO2: 96% 100%  Weight: 86.2 kg   Height: '5\' 4"'$  (1.626 m)    Body mass index is 32.61 kg/m. GENERAL: The patient is a well-nourished female, in no acute distress. The vital signs are documented above. CARDIAC: There is a regular rate and rhythm.  VASCULAR: I do not detect carotid bruits. She has palpable radial pulses. She has mild bilateral lower extremity swelling. PULMONARY: There is good air exchange bilaterally without wheezing or rales. ABDOMEN: Soft and non-tender with normal pitched bowel sounds.  MUSCULOSKELETAL: There are no major deformities. NEUROLOGIC: No focal weakness or paresthesias are detected. SKIN: There are no ulcers or rashes noted. PSYCHIATRIC: The patient has a normal affect.  DATA:    BILATERAL UPPER EXTREMITY VEIN MAP.  I have independently interpreted her upper extremity vein map.  On the right side, the forearm and upper arm cephalic vein do not appear adequate.  The basilic vein on the right looks reasonable in size.  On the left side the forearm cephalic vein looks marginal in size.  The upper arm cephalic vein narrows down to 1.6 mm.  In the forearm the diameters range from  3.1-3.8 mm.   The basilic vein on the left looks reasonable in size.  UPPER EXTREMITY ARTERIAL DUPLEX: I have independently interpreted her extremity arterial duplex.  On the right side there is a triphasic radial and ulnar waveform.  Brachial artery measures 4.3 mm in diameter.  On the left side the brachial artery measures 4.8 mm in diameter.  There is a triphasic radial and ulnar waveform.   Cherre Robins [facility] [place]

## 2022-10-13 NOTE — Op Note (Signed)
DATE OF SERVICE: 10/13/2022   PATIENT:  Linda Black  52 y.o. female   PRE-OPERATIVE DIAGNOSIS:  ESRD   POST-OPERATIVE DIAGNOSIS:  Same   PROCEDURE:   Left brachiobasilic arteriovenous fistula   SURGEON:  Surgeon(s) and Role:    * Cherre Robins, MD - Primary   ASSISTANT: Leontine Locket, PA-C   An experienced assistant was required given the complexity of this procedure and the standard of surgical care. My assistant helped with exposure through counter tension, suctioning, ligation and retraction to better visualize the surgical field.  My assistant expedited sewing during the case by following my sutures. Wherever I use the term "we" in the report, my assistant actively helped me with that portion of the procedure.   ANESTHESIA:   local, regional, and MAC   EBL: minimal   BLOOD ADMINISTERED:none   DRAINS: none    LOCAL MEDICATIONS USED:  NONE   SPECIMEN:  none   COUNTS: confirmed correct.   TOURNIQUET:  none   PATIENT DISPOSITION:  PACU - hemodynamically stable.   Delay start of Pharmacological VTE agent (>24hrs) due to surgical blood loss or risk of bleeding: no   INDICATION FOR PROCEDURE: Linda Black is a 52 y.o. female with ESRD in need of permanent dialysis access. After careful discussion of risks, benefits, and alternatives the patient was offered AVF. The patient  understood and wished to proceed.   OPERATIVE FINDINGS: healthy basilic vein coursing over the antecubital fossa. BB AVF created in antecubital fossa   DESCRIPTION OF PROCEDURE: After identification of the patient in the pre-operative holding area, the patient was transferred to the operating room. The patient was positioned supine on the operating room table. Anesthesia was induced. The left arm was prepped and draped in standard fashion. A surgical pause was performed confirming correct patient, procedure, and operative location.   Using intraoperative ultrasound the left brachial artery and basilic  vein were mapped.  A curvilinear incision was planned over the course of the two vessels to allow fistula creation.  Incision was created.  Incision was carried down through subcutaneous tissue.  The aponeurosis of the biceps tendon was divided.  The brachial sheath was identified.  The brachial artery was skeletonized.  The artery was encircled with 2 Silastic Vesseloops.  Next attention was turned to the basilic vein.  This was identified in the medial arm in its typical position.  The vein was mobilized throughout the length of the incision to allow tension-free arteriovenous fistula creation.  The distal end of the vein was clamped with a right angle.  The proximal end of the vein was clamped with a bulldog.  The vein was transected distally.  The stump was oversewn with a 2-0 silk.  The cut end of the vein was spatulated and distended with a mosquito clamp.  Patient was systemically heparinized with 3000 units of IV heparin.  After a three minute pause, the brachial artery was clamped proximally distally.  The basilic vein was anastomosed to the brachial artery into side using continuous running suture of 6-0 Prolene.  Immediately prior to completion the anastomosis was flushed and de-aired.  The anastomosis was completed.  Clamps were released.  Hemostasis was achieved.  An audible bruit was heard in the fistula. Radial doppler signal was noted in the left wrist.  Stasis was achieved in the surgical bed.  The wound was closed with 3-0 Vicryl and 4-0 Monocryl.   Upon completion of the case instrument and sharps counts  were confirmed correct. The patient was transferred to the PACU in good condition. I was present for all portions of the procedure.   Yevonne Aline. Stanford Breed, MD [facility] Phone Number: [phone] 10/13/2022 11:00 AM

## 2022-10-13 NOTE — Progress Notes (Deleted)
DATE OF SERVICE: 10/13/2022  PATIENT:  Linda Black  52 y.o. female  PRE-OPERATIVE DIAGNOSIS:  ESRD  POST-OPERATIVE DIAGNOSIS:  Same  PROCEDURE:   Left brachiobasilic arteriovenous fistula  SURGEON:  Surgeon(s) and Role:    * Cherre Robins, MD - Primary  ASSISTANT: Leontine Locket, PA-C  An experienced assistant was required given the complexity of this procedure and the standard of surgical care. My assistant helped with exposure through counter tension, suctioning, ligation and retraction to better visualize the surgical field.  My assistant expedited sewing during the case by following my sutures. Wherever I use the term "we" in the report, my assistant actively helped me with that portion of the procedure.  ANESTHESIA:   local, regional, and MAC  EBL: minimal  BLOOD ADMINISTERED:none  DRAINS: none   LOCAL MEDICATIONS USED:  NONE  SPECIMEN:  none  COUNTS: confirmed correct.  TOURNIQUET:  none  PATIENT DISPOSITION:  PACU - hemodynamically stable.   Delay start of Pharmacological VTE agent (>24hrs) due to surgical blood loss or risk of bleeding: no  INDICATION FOR PROCEDURE: Linda Black is a 52 y.o. female with ESRD in need of permanent dialysis access. After careful discussion of risks, benefits, and alternatives the patient was offered AVF. The patient  understood and wished to proceed.  OPERATIVE FINDINGS: healthy basilic vein coursing over the antecubital fossa. BB AVF created in antecubital fossa  DESCRIPTION OF PROCEDURE: After identification of the patient in the pre-operative holding area, the patient was transferred to the operating room. The patient was positioned supine on the operating room table. Anesthesia was induced. The left arm was prepped and draped in standard fashion. A surgical pause was performed confirming correct patient, procedure, and operative location.  Using intraoperative ultrasound the left brachial artery and basilic vein were mapped.  A  curvilinear incision was planned over the course of the two vessels to allow fistula creation.  Incision was created.  Incision was carried down through subcutaneous tissue.  The aponeurosis of the biceps tendon was divided.  The brachial sheath was identified.  The brachial artery was skeletonized.  The artery was encircled with 2 Silastic Vesseloops.  Next attention was turned to the basilic vein.  This was identified in the medial arm in its typical position.  The vein was mobilized throughout the length of the incision to allow tension-free arteriovenous fistula creation.  The distal end of the vein was clamped with a right angle.  The proximal end of the vein was clamped with a bulldog.  The vein was transected distally.  The stump was oversewn with a 2-0 silk.  The cut end of the vein was spatulated and distended with a mosquito clamp.  Patient was systemically heparinized with 3000 units of IV heparin.  After a three minute pause, the brachial artery was clamped proximally distally.  The basilic vein was anastomosed to the brachial artery into side using continuous running suture of 6-0 Prolene.  Immediately prior to completion the anastomosis was flushed and de-aired.  The anastomosis was completed.  Clamps were released.  Hemostasis was achieved.  An audible bruit was heard in the fistula. Radial doppler signal was noted in the left wrist.  Stasis was achieved in the surgical bed.  The wound was closed with 3-0 Vicryl and 4-0 Monocryl.  Upon completion of the case instrument and sharps counts were confirmed correct. The patient was transferred to the PACU in good condition. I was present for all portions of the  procedure.  Linda Black. Stanford Breed, MD [facility] [place] Phone Number: [phone] 10/13/2022 11:00 AM

## 2022-10-13 NOTE — Anesthesia Preprocedure Evaluation (Signed)
Anesthesia Evaluation  Patient identified by MRN, date of birth, ID band Patient awake    Reviewed: Allergy & Precautions, NPO status , Patient's Chart, lab work & pertinent test results  History of Anesthesia Complications Negative for: history of anesthetic complications  Airway Mallampati: II  TM Distance: >3 FB Neck ROM: Full    Dental  (+) Dental Advisory Given, Teeth Intact   Pulmonary neg pulmonary ROS Covid-19 Nucleic Acid Test Results Lab Results      Component                Value               Date                      [place]              NEGATIVE            02/07/2021              breath sounds clear to auscultation       Cardiovascular hypertension, Pt. on medications and Pt. on home beta blockers (-) angina (-) Past MI and (-) CHF  Rhythm:Regular     Neuro/Psych   Anxiety     negative neurological ROS  negative psych ROS   GI/Hepatic ,GERD  Medicated and Controlled,,  Endo/Other  diabetes, Insulin DependentHypothyroidism    Renal/GU ESRF and DialysisRenal diseaseLab Results      Component                Value               Date                      CREATININE               2.49 (H)            01/31/2021           Lab Results      Component                Value               Date                      K                        4.2                 01/31/2021                Musculoskeletal  (+) Arthritis , Osteoarthritis,    Abdominal  (+) + obese  Peds  Hematology  (+) Blood dyscrasia, anemia ptt 30  Lab Results      Component                Value               Date                      WBC                      6.4                 01/31/2021  HGB                      14.0                01/31/2021                HCT                      43.0                01/31/2021                MCV                      91.1                01/31/2021                PLT                      288                  01/31/2021            Lab Results      Component                Value               Date                      INR                      1.0                 01/31/2021                INR                      0.96                03/27/2017            Denies blood thinners   Anesthesia Other Findings  History includes never smoker, HTN, hypothyroidism, HLD, DM2, CKD with IgA nephropathy (s/p prednisone, Cytoxan 2018, now off), transaminitis (2019, negative Hepatitis B & C screening tests 04/13/18 hepatic steatosis by [place] 2020), language barrier ([place]). She had angioedema/anaphylaxis in the setting of ACEi and jackfruit, and was advised to avoid both (01/2018). BMI is consistent with obesity.  Last evaluation with nephrologist Dr. Moshe Cipro was on 12/26/20 (scanned under Media tab). Reviewed 12/17/20 labs showing Creatinine 2.2, albumin 4.4, HGB 12.8. Patient/daughter discussed the TKR likely planned for July. 01/31/21 PAT Creatinine 2.49, BUN 32, eGFR 23, previous labs on 12/17/20 showed Creatinine 2.20, BUN 42, eGFR25. (Creatinine range of 1.57-2.67 and eGFR 20-39 since 01/2018 in [facility].) Creatinine was called to surgeon's office.    Last A1c 5.8% on 09/18/20. She had been on Lantus ~ 2019 when on prednisone for IgA nephropathy. Her A1c has been in the 5.6-6.1% since 05/2018 and under 6% since 07/2019. Random glucose with PAT labs was 137. Given she has been in the pre-diabetic range for several years now and last test was less than five months ago, I do not think she needs a repeat preoperative A1c from an anesthesia standpoint. She will get a CBG on arrival for surgery.  Reproductive/Obstetrics                             Anesthesia Physical Anesthesia Plan  ASA: 4  Anesthesia Plan: General   Post-op Pain Management: Minimal or no pain anticipated   Induction: Intravenous  PONV Risk Score and Plan: 3 and Treatment may vary due to age or medical  condition, Ondansetron, Dexamethasone and Midazolam  Airway Management Planned: LMA  Additional Equipment: None  Intra-op Plan:   Post-operative Plan: Extubation in OR  Informed Consent: I have reviewed the patients History and Physical, chart, labs and discussed the procedure including the risks, benefits and alternatives for the proposed anesthesia with the patient or authorized representative who has indicated his/her understanding and acceptance.     Dental advisory given  Plan Discussed with: CRNA and Surgeon  Anesthesia Plan Comments: (PAT note written 02/01/2021 by Myra Gianotti, PA-C. )        Anesthesia Quick Evaluation

## 2022-10-13 NOTE — Anesthesia Postprocedure Evaluation (Signed)
Anesthesia Post Note  Patient: Linda Black  Procedure(s) Performed: LEFT ARM STAGE 1 BRACHIOBASILIC FISTULA CREATION (Left)     Patient location during evaluation: PACU Anesthesia Type: General Level of consciousness: awake and alert Pain management: pain level controlled Vital Signs Assessment: post-procedure vital signs reviewed and stable Respiratory status: spontaneous breathing, nonlabored ventilation and respiratory function stable Cardiovascular status: blood pressure returned to baseline and stable Postop Assessment: no apparent nausea or vomiting Anesthetic complications: no   No notable events documented.  Last Vitals:  Vitals:   10/13/22 1145 10/13/22 1150  BP: (!) 192/95   Pulse: 64   Resp: 16   Temp:  36.4 C  SpO2: 99%     Last Pain:  Vitals:   10/13/22 1145  TempSrc:   PainSc: Asleep                 [name]

## 2022-10-13 NOTE — Anesthesia Procedure Notes (Signed)
Procedure Name: LMA Insertion Date/Time: 10/13/2022 9:39 AM  Performed by: Thelma Comp, CRNAPre-anesthesia Checklist: Patient identified, Emergency Drugs available, Suction available and Patient being monitored Patient Re-evaluated:Patient Re-evaluated prior to induction Oxygen Delivery Method: Circle System Utilized Preoxygenation: Pre-oxygenation with 100% oxygen Induction Type: IV induction Ventilation: Mask ventilation without difficulty LMA: LMA inserted LMA Size: 4.0 Number of attempts: 1 Placement Confirmation: positive ETCO2 Tube secured with: Tape Dental Injury: Teeth and Oropharynx as per pre-operative assessment

## 2022-10-13 NOTE — Transfer of Care (Signed)
Immediate Anesthesia Transfer of Care Note  Patient: Linda Black  Procedure(s) Performed: LEFT ARM STAGE 1 BRACHIOBASILIC FISTULA CREATION (Left)  Patient Location: PACU  Anesthesia Type:General  Level of Consciousness: drowsy and patient cooperative  Airway & Oxygen Therapy: Patient Spontanous Breathing  Post-op Assessment: Report given to RN and Post -op Vital signs reviewed and stable  Post vital signs: Reviewed and stable  Last Vitals:  Vitals Value Taken Time  BP 180/91 10/13/22 1100  Temp    Pulse 72 10/13/22 1102  Resp 17 10/13/22 1102  SpO2 96 % 10/13/22 1102  Vitals shown include unvalidated device data.  Last Pain:  Vitals:   10/13/22 0724  TempSrc:   PainSc: 3       Patients Stated Pain Goal: 1 ([id number])  Complications: No notable events documented.

## 2022-10-14 ENCOUNTER — Encounter (HOSPITAL_COMMUNITY): Payer: Self-pay | Admitting: Vascular Surgery

## 2022-10-20 ENCOUNTER — Other Ambulatory Visit: Payer: Self-pay | Admitting: Physician Assistant

## 2022-11-14 ENCOUNTER — Other Ambulatory Visit: Payer: Self-pay | Admitting: *Deleted

## 2022-11-14 DIAGNOSIS — N185 Chronic kidney disease, stage 5: Secondary | ICD-10-CM

## 2022-11-25 ENCOUNTER — Ambulatory Visit (HOSPITAL_COMMUNITY)
Admission: RE | Admit: 2022-11-25 | Discharge: 2022-11-25 | Disposition: A | Discharge status: Home / Self Care | Payer: Medicaid Other | Source: Ambulatory Visit | Attending: Vascular Surgery | Admitting: Vascular Surgery

## 2022-11-25 ENCOUNTER — Ambulatory Visit (INDEPENDENT_AMBULATORY_CARE_PROVIDER_SITE_OTHER): Payer: Medicaid Other | Admitting: Physician Assistant

## 2022-11-25 ENCOUNTER — Other Ambulatory Visit: Payer: Self-pay

## 2022-11-25 VITALS — BP 193/97 | HR 72 | Temp 97.3°F | Resp 18 | Ht 60.0 in | Wt 193.5 lb

## 2022-11-25 DIAGNOSIS — N185 Chronic kidney disease, stage 5: Secondary | ICD-10-CM | POA: Diagnosis present

## 2022-11-25 NOTE — Progress Notes (Signed)
POST OPERATIVE OFFICE NOTE    CC:  F/u for surgery  HPI:  This is a 52 y.o. female who is s/p Left brachiobasilic arteriovenous fistula  on 10/13/22 by Dr. Lenell Antu.    Pt returns today for follow up.  Pt states she denies pain, loss of sensation or motor. She is CKD stage 5 and not on HD currently.     Allergies  Allergen Reactions   Lasix [Furosemide] Shortness Of Breath and Swelling    SOB, facial swelling   Lisinopril Swelling    Angioedema    Covid-19 (Mrna) Vaccine Hives    Got quite sick after both initial doses   Other Swelling    Ree Kida Fruit    Current Outpatient Medications  Medication Sig Dispense Refill   Accu-Chek Softclix Lancets lancets Use as instructed 100 each 12   amLODipine (NORVASC) 10 MG tablet Take 1 tablet (10 mg total) by mouth daily. 90 tablet 3   calcitRIOL (ROCALTROL) 0.25 MCG capsule Take 1 capsule (0.25 mcg total) by mouth daily. 90 capsule 3   furosemide (LASIX) 40 MG tablet Take 1 tablet (40 mg total) by mouth 2 (two) times daily. 180 tablet 3   glucose blood (ACCU-CHEK AVIVA PLUS) test strip Test daily 100 strip 3   levothyroxine (SYNTHROID) 25 MCG tablet TAKE 1 TABLET(25 MCG) BY MOUTH DAILY BEFORE BREAKFAST (Patient taking differently: 10/10/22- states she is taking) 90 tablet 0   losartan (COZAAR) 100 MG tablet TAKE 1 TABLET(100 MG) BY MOUTH DAILY 90 tablet 3   metoprolol succinate (TOPROL-XL) 25 MG 24 hr tablet Take 25 mg by mouth daily.     rifampin (RIFADIN) 300 MG capsule Take 300 mg by mouth 2 (two) times daily.     rosuvastatin (CRESTOR) 40 MG tablet Take 1 tablet (40 mg total) by mouth daily. 90 tablet 3   sodium bicarbonate 650 MG tablet Take 650 mg by mouth 2 (two) times daily.     traMADol (ULTRAM) 50 MG tablet Take 1 tablet (50 mg total) by mouth every 6 (six) hours as needed. 8 tablet 0   No current facility-administered medications for this visit.     ROS:  See HPI  Physical Exam:   Findings:   +--------------------+----------+-----------------+---------------------+  AVF                PSV (cm/s)Flow Vol (mL/min)      Comments         +--------------------+----------+-----------------+---------------------+  Native artery inflow   250          1401                              +--------------------+----------+-----------------+---------------------+  AVF Anastomosis        800                     exceeds nyquist limit  +--------------------+----------+-----------------+---------------------+     +------------+----------+-------------+----------+--------------------+  OUTFLOW VEINPSV (cm/s)Diameter (cm)Depth (cm)      Describe        +------------+----------+-------------+----------+--------------------+  Prox UA        101        0.86        2.07                         +------------+----------+-------------+----------+--------------------+  Mid UA         136  0.88        2.11                         +------------+----------+-------------+----------+--------------------+  Dist UA        281        0.82        1.31   competing branch x 2  +------------+----------+-------------+----------+--------------------+  AC Fossa       560        0.66        0.50                         +------------+----------+-------------+----------+--------------------+        Summary:  Patent arteriovenous fistula with 2 competing branches observed.   Incision:  Well healed Extremities:  palpable radial pulse and fistula thrill at the [facility] Neuro: Sensation intact and equal B UE Lungs:  non labored breathing    Assessment/Plan:  This is a 52 y.o. female who is s/p:first stage left UE basilic av fistula creation.  She is not on HD at this time.  The fistula diameter has matured very well.  The fistula is too deep for access and she will be schedule for a second stage basilic transposition.  She does not speak english well and an interpreter was  with her today in our office.   She agrees with the plan to proceed with surgery.  Monday 12/01/22 with Dr. Lenell Antu.       Willa Rough. Thomasena Edis, [facility] [phone]   Clinic MD:  Lenell Antu

## 2022-11-25 NOTE — H&P (View-Only) (Signed)
POST OPERATIVE OFFICE NOTE    CC:  F/u for surgery  HPI:  This is a 52 y.o. female who is s/p Left brachiobasilic arteriovenous fistula  on 10/13/22 by Dr. Hawken.    Pt returns today for follow up.  Pt states she denies pain, loss of sensation or motor. She is CKD stage 5 and not on HD currently.     Allergies  Allergen Reactions   Lasix [Furosemide] Shortness Of Breath and Swelling    SOB, facial swelling   Lisinopril Swelling    Angioedema    Covid-19 (Mrna) Vaccine Hives    Got quite sick after both initial doses   Other Swelling    Jack Fruit    Current Outpatient Medications  Medication Sig Dispense Refill   Accu-Chek Softclix Lancets lancets Use as instructed 100 each 12   amLODipine (NORVASC) 10 MG tablet Take 1 tablet (10 mg total) by mouth daily. 90 tablet 3   calcitRIOL (ROCALTROL) 0.25 MCG capsule Take 1 capsule (0.25 mcg total) by mouth daily. 90 capsule 3   furosemide (LASIX) 40 MG tablet Take 1 tablet (40 mg total) by mouth 2 (two) times daily. 180 tablet 3   glucose blood (ACCU-CHEK AVIVA PLUS) test strip Test daily 100 strip 3   levothyroxine (SYNTHROID) 25 MCG tablet TAKE 1 TABLET(25 MCG) BY MOUTH DAILY BEFORE BREAKFAST (Patient taking differently: 10/10/22- states she is taking) 90 tablet 0   losartan (COZAAR) 100 MG tablet TAKE 1 TABLET(100 MG) BY MOUTH DAILY 90 tablet 3   metoprolol succinate (TOPROL-XL) 25 MG 24 hr tablet Take 25 mg by mouth daily.     rifampin (RIFADIN) 300 MG capsule Take 300 mg by mouth 2 (two) times daily.     rosuvastatin (CRESTOR) 40 MG tablet Take 1 tablet (40 mg total) by mouth daily. 90 tablet 3   sodium bicarbonate 650 MG tablet Take 650 mg by mouth 2 (two) times daily.     traMADol (ULTRAM) 50 MG tablet Take 1 tablet (50 mg total) by mouth every 6 (six) hours as needed. 8 tablet 0   No current facility-administered medications for this visit.     ROS:  See HPI  Physical Exam:   Findings:   +--------------------+----------+-----------------+---------------------+  AVF                PSV (cm/s)Flow Vol (mL/min)      Comments         +--------------------+----------+-----------------+---------------------+  Native artery inflow   250          1401                              +--------------------+----------+-----------------+---------------------+  AVF Anastomosis        800                     exceeds nyquist limit  +--------------------+----------+-----------------+---------------------+     +------------+----------+-------------+----------+--------------------+  OUTFLOW VEINPSV (cm/s)Diameter (cm)Depth (cm)      Describe        +------------+----------+-------------+----------+--------------------+  Prox UA        101        0.86        2.07                         +------------+----------+-------------+----------+--------------------+  Mid UA         136          0.88        2.11                         +------------+----------+-------------+----------+--------------------+  Dist UA        281        0.82        1.31   competing branch x 2  +------------+----------+-------------+----------+--------------------+  AC Fossa       560        0.66        0.50                         +------------+----------+-------------+----------+--------------------+        Summary:  Patent arteriovenous fistula with 2 competing branches observed.   Incision:  Well healed Extremities:  palpable radial pulse and fistula thrill at the AC Neuro: Sensation intact and equal B UE Lungs:  non labored breathing    Assessment/Plan:  This is a 52 y.o. female who is s/p:first stage left UE basilic av fistula creation.  She is not on HD at this time.  The fistula diameter has matured very well.  The fistula is too deep for access and she will be schedule for a second stage basilic transposition.  She does not speak english well and an interpreter was  with her today in our office.   She agrees with the plan to proceed with surgery.  Monday 12/01/22 with Dr. Hawken.       Holden Draughon M. Kathyjo Briere, PAC [facility] [phone]   Clinic MD:  Hawken 

## 2022-11-26 ENCOUNTER — Other Ambulatory Visit: Payer: Self-pay | Admitting: Medical

## 2022-11-28 ENCOUNTER — Encounter (HOSPITAL_COMMUNITY): Payer: Self-pay | Admitting: Vascular Surgery

## 2022-11-28 ENCOUNTER — Other Ambulatory Visit: Payer: Self-pay

## 2022-11-28 NOTE — Anesthesia Preprocedure Evaluation (Signed)
Anesthesia Evaluation  Patient identified by MRN, date of birth, ID band Patient awake    Reviewed: Allergy & Precautions, NPO status , Patient's Chart, lab work & pertinent test results  History of Anesthesia Complications Negative for: history of anesthetic complications  Airway Mallampati: III  TM Distance: >3 FB Neck ROM: Full    Dental no notable dental hx. (+) Dental Advisory Given, Teeth Intact   Pulmonary neg pulmonary ROS Covid-19 Nucleic Acid Test Results Lab Results      Component                Value               Date                      SARSCOV2NAA              NEGATIVE            02/07/2021              Pulmonary exam normal breath sounds clear to auscultation       Cardiovascular hypertension, Pt. on medications and Pt. on home beta blockers (-) angina (-) Past MI Normal cardiovascular exam+ Valvular Problems/Murmurs  Rhythm:Regular Rate:Normal  Limited echo 09/22/2022 (Care Everywhere): SUMMARY  The left ventricular size is normal with mild concentric hypertrophy.  Left ventricular systolic function is normal.  LV ejection fraction = 60-65%.  The left ventricular wall motion is normal on contrasted views.  Mild paradoxical septal motion.  The right ventricle is normal in size and function.  There is no pericardial effusion.   Compared to priorstudy, the LV endocardium was better visualized with  contrast. LV function is normal.   Stress test 08/19/2022 (Care Everywhere): SUMMARY  The patient had no chest pain during stress  The patient achieved 84 % of maximum predicted heart rate.  Positive stress ECG for inducible ischemia at target heart rate.  Negative dobutamine stress echocardiography for inducible ischemia at target  heart rate achieved.     Neuro/Psych   Anxiety     negative neurological ROS  negative psych ROS   GI/Hepatic ,GERD  Medicated and Controlled,,  Endo/Other  diabetes, Insulin  DependentHypothyroidism    Renal/GU ESRF and DialysisRenal diseaseLab Results      Component                Value               Date                      CREATININE               2.49 (H)            01/31/2021           Lab Results      Component                Value               Date                      K                        4.2                 01/31/2021  Musculoskeletal  (+) Arthritis , Osteoarthritis,    Abdominal  (+) + obese  Peds  Hematology  (+) Blood dyscrasia, anemia ptt 30  Lab Results      Component                Value               Date                      WBC                      6.4                 01/31/2021                HGB                      14.0                01/31/2021                HCT                      43.0                01/31/2021                MCV                      91.1                01/31/2021                PLT                      288                 01/31/2021            Lab Results      Component                Value               Date                      INR                      1.0                 01/31/2021                INR                      0.96                03/27/2017            Denies blood thinners   Anesthesia Other Findings  History includes never smoker, HTN, hypothyroidism, HLD, DM2, CKD with IgA nephropathy (s/p prednisone, Cytoxan 2018, now off), transaminitis (2019, negative Hepatitis B & C screening tests 04/13/18 hepatic steatosis by [place] 2020), language barrier ([place]). She had angioedema/anaphylaxis in the setting of ACEi and jackfruit, and was advised to avoid both (01/2018). BMI is consistent with obesity.  Last evaluation with nephrologist Dr. Kathrene Bongo was on 12/26/20 (scanned under Media  tab). Reviewed 12/17/20 labs showing Creatinine 2.2, albumin 4.4, HGB 12.8. Patient/daughter discussed the TKR likely planned for July. 01/31/21 PAT Creatinine 2.49, BUN 32, eGFR 23, previous  labs on 12/17/20 showed Creatinine 2.20, BUN 42, eGFR25. (Creatinine range of 1.57-2.67 and eGFR 20-39 since 01/2018 in [facility].) Creatinine was called to surgeon's office.    Last A1c 5.8% on 09/18/20. She had been on Lantus ~ 2019 when on prednisone for IgA nephropathy. Her A1c has been in the 5.6-6.1% since 05/2018 and under 6% since 07/2019. Random glucose with PAT labs was 137. Given she has been in the pre-diabetic range for several years now and last test was less than five months ago, I do not think she needs a repeat preoperative A1c from an anesthesia standpoint. She will get a CBG on arrival for surgery.   Reproductive/Obstetrics                             Anesthesia Physical Anesthesia Plan  ASA: 3  Anesthesia Plan: General   Post-op Pain Management: Minimal or no pain anticipated   Induction: Intravenous  PONV Risk Score and Plan: 3 and Treatment may vary due to age or medical condition, Ondansetron, Dexamethasone and Midazolam  Airway Management Planned:   Additional Equipment: None  Intra-op Plan:   Post-operative Plan:   Informed Consent: I have reviewed the patients History and Physical, chart, labs and discussed the procedure including the risks, benefits and alternatives for the proposed anesthesia with the patient or authorized representative who has indicated his/her understanding and acceptance.     Dental advisory given  Plan Discussed with: CRNA  Anesthesia Plan Comments: (PAT note by Antionette Poles, PA-C:  52 year old female with pertinent history includes CKD 5 not yet on HD (s/p first stage left brachiobasilic AV fistula 10/13/2022), HTN, diet-controlled DM2.  Patient recently had cardiac testing at [facility] as part of pretransplant evaluation.  Nuclear stress test was read as abnormal.  She followed up with cardiologist Dr. Koren Bound who commented on this.  Per note 09/16/2022, "No exertional symptoms. On exam, she is hypertensive,  otherwise patient is warm, well-perfused and appears euvolemic. Upon closer review of the stress test, she actually has baseline LVH related repolarization EKG changes, which would make inferolateral ST changes non-diagnostic. The actual stress echo portion was normal. Thus, overall, I would deem the stress test as negative for inducible ischemia. She also had a surface TTE that reports mildly reduced function, but I think endocardial borders were not fully visualized. I recommend a repeat limited TTE (specify with optison contrast) and not pursue further ischemic evaluation at this time."  Patient will need day of surgery labs and evaluation.  EKG 10/13/2022: NSR.  Rate 61.  Limited echo 09/22/2022 (Care Everywhere): SUMMARY  The left ventricular size is normal with mild concentric hypertrophy.  Left ventricular systolic function is normal.  LV ejection fraction = 60-65%.  The left ventricular wall motion is normal on contrasted views.  Mild paradoxical septal motion.  The right ventricle is normal in size and function.  There is no pericardial effusion.   Compared to priorstudy, the LV endocardium was better visualized with  contrast. LV function is normal.   Stress test 08/19/2022 (Care Everywhere): SUMMARY  The patient had no chest pain during stress  The patient achieved 84 % of maximum predicted heart rate.  Positive stress ECG for inducible ischemia at target heart rate.  Negative dobutamine stress echocardiography  for inducible ischemia at target  heart rate achieved.    )        Anesthesia Quick Evaluation

## 2022-11-28 NOTE — Progress Notes (Signed)
Anesthesia Chart Review: Same day workup  52 year old female with pertinent history includes CKD 5 not yet on HD (s/p first stage left brachiobasilic AV fistula 10/13/2022), HTN, diet-controlled DM2.  Patient recently had cardiac testing at [facility] as part of pretransplant evaluation.  Nuclear stress test was read as abnormal.  She followed up with cardiologist Dr. Koren Bound who commented on this.  Per note 09/16/2022, "No exertional symptoms. On exam, she is hypertensive, otherwise patient is warm, well-perfused and appears euvolemic. Upon closer review of the stress test, she actually has baseline LVH related repolarization EKG changes, which would make inferolateral ST changes non-diagnostic. The actual stress echo portion was normal. Thus, overall, I would deem the stress test as negative for inducible ischemia. She also had a surface TTE that reports mildly reduced function, but I think endocardial borders were not fully visualized. I recommend a repeat limited TTE (specify with optison contrast) and not pursue further ischemic evaluation at this time."  Patient will need day of surgery labs and evaluation.  EKG 10/13/2022: NSR.  Rate 61.  Limited echo 09/22/2022 (Care Everywhere): SUMMARY  The left ventricular size is normal with mild concentric hypertrophy.  Left ventricular systolic function is normal.  LV ejection fraction = 60-65%.  The left ventricular wall motion is normal on contrasted views.  Mild paradoxical septal motion.  The right ventricle is normal in size and function.  There is no pericardial effusion.   Compared to prior study, the LV endocardium was better visualized with  contrast. LV function is normal.   Stress test 08/19/2022 (Care Everywhere): SUMMARY  The patient had no chest pain during stress  The patient achieved 84 % of maximum predicted heart rate.  Positive stress ECG for inducible ischemia at target heart rate.  Negative dobutamine stress  echocardiography for inducible ischemia at target  heart rate achieved.     Zannie Cove [facility] Short Stay [facility]/Anesthesiology Phone [phone] 11/28/2022 10:58 AM

## 2022-11-28 NOTE — Progress Notes (Signed)
SDW call  Patients son, Linda Black, was given pre-op instructions over the phone. Patients son, Linda Black verbalized understanding of instructions provided. He understand and speaks English very clearly, is knowledgeable and aware of his mothers history.  He will be here the day of surgery. [employer] did not have an interpreter with patients specific dialect of [place] available today.     PCP - [facility] Nephrologist: [facility]  Cardiologist - Denies   PPM/ICD - Denies   Chest x-ray - 02/01/2021 EKG -  10/13/2022 Stress Test - ECHO -  Cardiac Cath -   Sleep Study/sleep apnea/CPAP: Denies  Type II diabetic.  Diet controlled.  Fasting Blood sugar range: 100-150 How often check sugars: Now only weekly   Blood Thinner Instructions: n/a Aspirin Instructions:Denies   ERAS Protcol - No, NPO PRE-SURGERY Ensure or G2-    COVID TEST- n/a    Anesthesia review: Yes. HTN, DM, heart murmur, hepatitis, high cholesterol   Patient denies shortness of breath, fever, cough and chest pain over the phone call  Your procedure is scheduled on Monday December 01, 2022  Report to [facility] Main Entrance "A" at  0530 A.M., then check in with the Admitting office.  Call this number if you have problems the morning of surgery:  [phone]   If you have any questions prior to your surgery date call [phone]: Open Monday-Friday 8am-4pm If you experience any cold or flu symptoms such as cough, fever, chills, shortness of breath, etc. between now and your scheduled surgery, please notify us at the above number     Remember:  Do not eat or drink after midnight the night before your surgery Take these medicines the morning of surgery with A SIP OF WATER:  Amlodipine, levothyroxine, metoprolol, crestor, rifampin  As of today, STOP taking any Aspirin (unless otherwise instructed by your surgeon) Aleve, Naproxen, Ibuprofen, Motrin, Advil, Goody's, BC's, all herbal  medications, fish oil, and all vitamins.

## 2022-12-01 ENCOUNTER — Other Ambulatory Visit: Payer: Self-pay

## 2022-12-01 ENCOUNTER — Ambulatory Visit (HOSPITAL_COMMUNITY): Payer: Medicaid Other | Admitting: Physician Assistant

## 2022-12-01 ENCOUNTER — Encounter (HOSPITAL_COMMUNITY): Payer: Self-pay | Admitting: Vascular Surgery

## 2022-12-01 ENCOUNTER — Ambulatory Visit (HOSPITAL_COMMUNITY)
Admission: RE | Admit: 2022-12-01 | Discharge: 2022-12-01 | Disposition: A | Discharge status: Home / Self Care | Payer: Medicaid Other | Attending: Vascular Surgery | Admitting: Vascular Surgery

## 2022-12-01 ENCOUNTER — Encounter (HOSPITAL_COMMUNITY)
Admission: RE | Disposition: A | Discharge status: Home / Self Care | Payer: Self-pay | Source: Home / Self Care | Attending: Vascular Surgery

## 2022-12-01 ENCOUNTER — Ambulatory Visit (HOSPITAL_BASED_OUTPATIENT_CLINIC_OR_DEPARTMENT_OTHER): Payer: Medicaid Other | Admitting: Physician Assistant

## 2022-12-01 DIAGNOSIS — Z6835 Body mass index (BMI) 35.0-35.9, adult: Secondary | ICD-10-CM | POA: Insufficient documentation

## 2022-12-01 DIAGNOSIS — N186 End stage renal disease: Secondary | ICD-10-CM

## 2022-12-01 DIAGNOSIS — F419 Anxiety disorder, unspecified: Secondary | ICD-10-CM | POA: Diagnosis not present

## 2022-12-01 DIAGNOSIS — E039 Hypothyroidism, unspecified: Secondary | ICD-10-CM

## 2022-12-01 DIAGNOSIS — N185 Chronic kidney disease, stage 5: Secondary | ICD-10-CM

## 2022-12-01 DIAGNOSIS — I120 Hypertensive chronic kidney disease with stage 5 chronic kidney disease or end stage renal disease: Secondary | ICD-10-CM

## 2022-12-01 DIAGNOSIS — M1990 Unspecified osteoarthritis, unspecified site: Secondary | ICD-10-CM | POA: Diagnosis not present

## 2022-12-01 DIAGNOSIS — D638 Anemia in other chronic diseases classified elsewhere: Secondary | ICD-10-CM

## 2022-12-01 DIAGNOSIS — Z794 Long term (current) use of insulin: Secondary | ICD-10-CM | POA: Insufficient documentation

## 2022-12-01 DIAGNOSIS — E1122 Type 2 diabetes mellitus with diabetic chronic kidney disease: Secondary | ICD-10-CM | POA: Insufficient documentation

## 2022-12-01 DIAGNOSIS — K219 Gastro-esophageal reflux disease without esophagitis: Secondary | ICD-10-CM | POA: Diagnosis not present

## 2022-12-01 DIAGNOSIS — Z992 Dependence on renal dialysis: Secondary | ICD-10-CM

## 2022-12-01 DIAGNOSIS — D631 Anemia in chronic kidney disease: Secondary | ICD-10-CM

## 2022-12-01 DIAGNOSIS — E669 Obesity, unspecified: Secondary | ICD-10-CM | POA: Insufficient documentation

## 2022-12-01 DIAGNOSIS — I517 Cardiomegaly: Secondary | ICD-10-CM | POA: Insufficient documentation

## 2022-12-01 HISTORY — PX: BASCILIC VEIN TRANSPOSITION: SHX5742

## 2022-12-01 LAB — POCT I-STAT, CHEM 8
BUN: 69 mg/dL — ABNORMAL HIGH (ref 6–20)
Calcium, Ion: 1.22 mmol/L (ref 1.15–1.40)
Chloride: 115 mmol/L — ABNORMAL HIGH (ref 98–111)
Creatinine, Ser: 5.1 mg/dL — ABNORMAL HIGH (ref 0.44–1.00)
Glucose, Bld: 87 mg/dL (ref 70–99)
HCT: 33 % — ABNORMAL LOW (ref 36.0–46.0)
Hemoglobin: 11.2 g/dL — ABNORMAL LOW (ref 12.0–15.0)
Potassium: 4.5 mmol/L (ref 3.5–5.1)
Sodium: 142 mmol/L (ref 135–145)
TCO2: 21 mmol/L — ABNORMAL LOW (ref 22–32)

## 2022-12-01 LAB — GLUCOSE, CAPILLARY
Glucose-Capillary: 76 mg/dL (ref 70–99)
Glucose-Capillary: 120 mg/dL — ABNORMAL HIGH (ref 70–99)
Glucose-Capillary: 104 mg/dL — ABNORMAL HIGH (ref 70–99)

## 2022-12-01 SURGERY — TRANSPOSITION, VEIN, BASILIC
Anesthesia: General | Laterality: Left

## 2022-12-01 MED ORDER — OXYCODONE HCL 5 MG/5ML PO SOLN: 5.0000 mg | Freq: Once | ORAL | Status: DC | PRN

## 2022-12-01 MED ORDER — FENTANYL CITRATE (PF) 250 MCG/5ML IJ SOLN
INTRAMUSCULAR | Status: AC
  Filled 2022-12-01: qty 5

## 2022-12-01 MED ORDER — TRAMADOL HCL 50 MG PO TABS: 50.0000 mg | ORAL_TABLET | Freq: Four times a day (QID) | ORAL | 0 refills | Status: DC | PRN

## 2022-12-01 MED ORDER — HEPARIN 6000 UNIT IRRIGATION SOLUTION
Status: DC | PRN
  Administered 2022-12-01: 1

## 2022-12-01 MED ORDER — PAPAVERINE HCL 30 MG/ML IJ SOLN
INTRAMUSCULAR | Status: AC
  Filled 2022-12-01: qty 2

## 2022-12-01 MED ORDER — ACETAMINOPHEN 500 MG PO TABS
1000.0000 mg | ORAL_TABLET | Freq: Once | ORAL | Status: AC
  Administered 2022-12-01: 1000 mg via ORAL
  Filled 2022-12-01: qty 2

## 2022-12-01 MED ORDER — CHLORHEXIDINE GLUCONATE 0.12 % MT SOLN
15.0000 mL | Freq: Once | OROMUCOSAL | Status: AC
  Administered 2022-12-01: 15 mL via OROMUCOSAL
  Filled 2022-12-01: qty 15

## 2022-12-01 MED ORDER — HEPARIN 6000 UNIT IRRIGATION SOLUTION
Status: AC
  Filled 2022-12-01: qty 500

## 2022-12-01 MED ORDER — MIDAZOLAM HCL 2 MG/2ML IJ SOLN
INTRAMUSCULAR | Status: AC
  Filled 2022-12-01: qty 2

## 2022-12-01 MED ORDER — LIDOCAINE 2% (20 MG/ML) 5 ML SYRINGE
INTRAMUSCULAR | Status: DC | PRN
  Administered 2022-12-01: 80 mg via INTRAVENOUS

## 2022-12-01 MED ORDER — PHENYLEPHRINE 80 MCG/ML (10ML) SYRINGE FOR IV PUSH (FOR BLOOD PRESSURE SUPPORT)
PREFILLED_SYRINGE | INTRAVENOUS | Status: DC | PRN
  Administered 2022-12-01 (×2): 120 ug via INTRAVENOUS
  Administered 2022-12-01 (×2): 80 ug via INTRAVENOUS
  Administered 2022-12-01: 120 ug via INTRAVENOUS
  Administered 2022-12-01: 80 ug via INTRAVENOUS

## 2022-12-01 MED ORDER — FENTANYL CITRATE (PF) 250 MCG/5ML IJ SOLN
INTRAMUSCULAR | Status: DC | PRN
  Administered 2022-12-01 (×4): 25 ug via INTRAVENOUS

## 2022-12-01 MED ORDER — PHENYLEPHRINE HCL-NACL 20-0.9 MG/250ML-% IV SOLN
INTRAVENOUS | Status: DC | PRN
  Administered 2022-12-01: 25 ug/min via INTRAVENOUS

## 2022-12-01 MED ORDER — SODIUM CHLORIDE 0.9 % IV SOLN: INTRAVENOUS | Status: DC

## 2022-12-01 MED ORDER — CHLORHEXIDINE GLUCONATE 4 % EX SOLN: 60.0000 mL | Freq: Once | CUTANEOUS | Status: DC

## 2022-12-01 MED ORDER — CEFAZOLIN SODIUM-DEXTROSE 2-4 GM/100ML-% IV SOLN
2.0000 g | INTRAVENOUS | Status: AC
  Administered 2022-12-01: 2 g via INTRAVENOUS
  Filled 2022-12-01: qty 100

## 2022-12-01 MED ORDER — MEPERIDINE HCL 25 MG/ML IJ SOLN: 6.2500 mg | INTRAMUSCULAR | Status: DC | PRN

## 2022-12-01 MED ORDER — PROPOFOL 10 MG/ML IV BOLUS
INTRAVENOUS | Status: DC | PRN
  Administered 2022-12-01: 140 mg via INTRAVENOUS

## 2022-12-01 MED ORDER — ONDANSETRON HCL 4 MG/2ML IJ SOLN
INTRAMUSCULAR | Status: DC | PRN
  Administered 2022-12-01: 4 mg via INTRAVENOUS

## 2022-12-01 MED ORDER — INSULIN ASPART 100 UNIT/ML IJ SOLN: 0.0000 [IU] | INTRAMUSCULAR | Status: DC | PRN

## 2022-12-01 MED ORDER — MIDAZOLAM HCL 2 MG/2ML IJ SOLN
INTRAMUSCULAR | Status: DC | PRN
  Administered 2022-12-01 (×2): 1 mg via INTRAVENOUS

## 2022-12-01 MED ORDER — FENTANYL CITRATE (PF) 100 MCG/2ML IJ SOLN
INTRAMUSCULAR | Status: AC
  Filled 2022-12-01: qty 2

## 2022-12-01 MED ORDER — PROMETHAZINE HCL 25 MG/ML IJ SOLN: 6.2500 mg | INTRAMUSCULAR | Status: DC | PRN

## 2022-12-01 MED ORDER — 0.9 % SODIUM CHLORIDE (POUR BTL) OPTIME
TOPICAL | Status: DC | PRN
  Administered 2022-12-01: 1000 mL

## 2022-12-01 MED ORDER — DEXAMETHASONE SODIUM PHOSPHATE 10 MG/ML IJ SOLN
INTRAMUSCULAR | Status: DC | PRN
  Administered 2022-12-01: 5 mg via INTRAVENOUS

## 2022-12-01 MED ORDER — FENTANYL CITRATE (PF) 100 MCG/2ML IJ SOLN
25.0000 ug | INTRAMUSCULAR | Status: DC | PRN
  Administered 2022-12-01: 25 ug via INTRAVENOUS
  Administered 2022-12-01: 50 ug via INTRAVENOUS
  Administered 2022-12-01: 25 ug via INTRAVENOUS

## 2022-12-01 MED ORDER — ORAL CARE MOUTH RINSE: 15.0000 mL | Freq: Once | OROMUCOSAL | Status: AC

## 2022-12-01 MED ORDER — OXYCODONE HCL 5 MG PO TABS: 5.0000 mg | ORAL_TABLET | Freq: Once | ORAL | Status: DC | PRN

## 2022-12-01 MED ORDER — LIDOCAINE-EPINEPHRINE (PF) 1 %-1:200000 IJ SOLN
INTRAMUSCULAR | Status: AC
  Filled 2022-12-01: qty 30

## 2022-12-01 MED ORDER — PROPOFOL 10 MG/ML IV BOLUS
INTRAVENOUS | Status: AC
  Filled 2022-12-01: qty 20

## 2022-12-01 SURGICAL SUPPLY — 40 items
APL PRP STRL LF DISP 70% ISPRP (MISCELLANEOUS) ×1
APL SKNCLS STERI-STRIP NONHPOA (GAUZE/BANDAGES/DRESSINGS) ×1
ARMBAND PINK RESTRICT EXTREMIT (MISCELLANEOUS) ×2 IMPLANT
BAG COUNTER SPONGE SURGICOUNT (BAG) ×2 IMPLANT
BAG SPNG CNTER NS LX DISP (BAG)
BENZOIN TINCTURE PRP APPL 2/3 (GAUZE/BANDAGES/DRESSINGS) ×2 IMPLANT
CANISTER SUCT 3000ML PPV (MISCELLANEOUS) ×2 IMPLANT
CHLORAPREP W/TINT 26 (MISCELLANEOUS) ×2 IMPLANT
CLIP LIGATING EXTRA MED SLVR (CLIP) ×2 IMPLANT
CLIP LIGATING EXTRA SM BLUE (MISCELLANEOUS) ×2 IMPLANT
COVER PROBE W GEL 5X96 (DRAPES) ×2 IMPLANT
DRSG TEGADERM 4X4.75 (GAUZE/BANDAGES/DRESSINGS) IMPLANT
ELECT REM PT RETURN 9FT ADLT (ELECTROSURGICAL) ×1
ELECTRODE REM PT RTRN 9FT ADLT (ELECTROSURGICAL) ×2 IMPLANT
GAUZE SPONGE 4X4 12PLY STRL (GAUZE/BANDAGES/DRESSINGS) IMPLANT
GLOVE BIO SURGEON STRL SZ8 (GLOVE) ×2 IMPLANT
GOWN STRL REUS W/ TWL LRG LVL3 (GOWN DISPOSABLE) ×4 IMPLANT
GOWN STRL REUS W/ TWL XL LVL3 (GOWN DISPOSABLE) ×2 IMPLANT
GOWN STRL REUS W/TWL LRG LVL3 (GOWN DISPOSABLE) ×2
GOWN STRL REUS W/TWL XL LVL3 (GOWN DISPOSABLE) ×1
KIT BASIN OR (CUSTOM PROCEDURE TRAY) ×2 IMPLANT
KIT TURNOVER KIT B (KITS) ×2 IMPLANT
NS IRRIG 1000ML POUR BTL (IV SOLUTION) ×2 IMPLANT
PACK CV ACCESS (CUSTOM PROCEDURE TRAY) ×2 IMPLANT
PAD ARMBOARD 7.5X6 YLW CONV (MISCELLANEOUS) ×4 IMPLANT
SLING ARM FOAM STRAP LRG (SOFTGOODS) IMPLANT
SLING ARM FOAM STRAP MED (SOFTGOODS) IMPLANT
STRIP CLOSURE SKIN 1/2X4 (GAUZE/BANDAGES/DRESSINGS) ×4 IMPLANT
SUT MNCRL AB 4-0 PS2 18 (SUTURE) ×2 IMPLANT
SUT PROLENE 6 0 BV (SUTURE) ×2 IMPLANT
SUT SILK 2 0 SH (SUTURE) IMPLANT
SUT SILK 3 0 (SUTURE) ×1
SUT SILK 3-0 18XBRD TIE 12 (SUTURE) IMPLANT
SUT VIC AB 3-0 SH 27 (SUTURE) ×2
SUT VIC AB 3-0 SH 27X BRD (SUTURE) ×2 IMPLANT
TAPE STRIPS DRAPE STRL (GAUZE/BANDAGES/DRESSINGS) IMPLANT
TAPE UMBILICAL 1/8X30 (MISCELLANEOUS) IMPLANT
TOWEL GREEN STERILE (TOWEL DISPOSABLE) ×2 IMPLANT
UNDERPAD 30X36 HEAVY ABSORB (UNDERPADS AND DIAPERS) ×2 IMPLANT
WATER STERILE IRR 1000ML POUR (IV SOLUTION) ×2 IMPLANT

## 2022-12-01 NOTE — Interval H&P Note (Signed)
History and Physical Interval Note:  12/01/2022 7:20 AM  Linda Black  has presented today for surgery, with the diagnosis of Chronic Kidney Disease Stage V.  The various methods of treatment have been discussed with the patient and family. After consideration of risks, benefits and other options for treatment, the patient has consented to  Procedure(s): LEFT ARM SECOND STAGE BASILIC VEIN TRANSPOSITION (Left) as a surgical intervention.  The patient's history has been reviewed, patient examined, no change in status, stable for surgery.  I have reviewed the patient's chart and labs.  Questions were answered to the patient's satisfaction.     [name]

## 2022-12-01 NOTE — Anesthesia Procedure Notes (Signed)
Procedure Name: LMA Insertion Date/Time: 12/01/2022 7:40 AM  Performed by: Randon Goldsmith, CRNAPre-anesthesia Checklist: Patient identified, Emergency Drugs available, Suction available and Patient being monitored Patient Re-evaluated:Patient Re-evaluated prior to induction Oxygen Delivery Method: Circle system utilized Preoxygenation: Pre-oxygenation with 100% oxygen Induction Type: IV induction Ventilation: Mask ventilation without difficulty LMA: LMA inserted LMA Size: 4.0 Number of attempts: 1 Airway Equipment and Method: Bite block Placement Confirmation: positive ETCO2 and breath sounds checked- equal and bilateral Tube secured with: Tape Dental Injury: Teeth and Oropharynx as per pre-operative assessment

## 2022-12-01 NOTE — Transfer of Care (Signed)
Immediate Anesthesia Transfer of Care Note  Patient: Linda Black  Procedure(s) Performed: LEFT ARM SECOND STAGE BASILIC VEIN TRANSPOSITION (Left)  Patient Location: PACU  Anesthesia Type:General  Level of Consciousness: drowsy  Airway & Oxygen Therapy: Patient Spontanous Breathing and Patient connected to face mask oxygen  Post-op Assessment: Report given to RN and Post -op Vital signs reviewed and stable  Post vital signs: Reviewed and stable  Last Vitals:  Vitals Value Taken Time  BP 128/67 12/01/22 0941  Temp    Pulse 67 12/01/22 0942  Resp 21 12/01/22 0942  SpO2 97 % 12/01/22 0942  Vitals shown include unvalidated device data.  Last Pain:  Vitals:   12/01/22 0700  TempSrc:   PainSc: 0-No pain         Complications: No notable events documented.

## 2022-12-01 NOTE — Discharge Instructions (Signed)
[facility]  Discharge Instructions  AV Fistula or Graft Surgery for Dialysis Access  Please refer to the following instructions for your post-procedure care. Your surgeon or physician assistant will discuss any changes with you.  Activity  You may drive the day following your surgery, if you are comfortable and no longer taking prescription pain medication. Resume full activity as the soreness in your incision resolves.  Bathing/Showering  You may shower after you go home. Keep your incision dry for 48 hours. Do not soak in a bathtub, hot tub, or swim until the incision heals completely. You may not shower if you have a hemodialysis catheter.  Incision Care  Clean your incision with mild soap and water after 48 hours. Pat the area dry with a clean towel. You do not need a bandage unless otherwise instructed. Do not apply any ointments or creams to your incision. You may have skin glue on your incision. Do not peel it off. It will come off on its own in about one week. Your arm may swell a bit after surgery. To reduce swelling use pillows to elevate your arm so it is above your heart. Your doctor will tell you if you need to lightly wrap your arm with an ACE bandage.  Diet  Resume your normal diet. There are not special food restrictions following this procedure. In order to heal from your surgery, it is CRITICAL to get adequate nutrition. Your body requires vitamins, minerals, and protein. Vegetables are the best source of vitamins and minerals. Vegetables also provide the perfect balance of protein. Processed food has little nutritional value, so try to avoid this.  Medications  Resume taking all of your medications. If your incision is causing pain, you may take over-the counter pain relievers such as acetaminophen (Tylenol). If you were prescribed a stronger pain medication, please be aware these medications can cause nausea and constipation. Prevent  nausea by taking the medication with a snack or meal. Avoid constipation by drinking plenty of fluids and eating foods with high amount of fiber, such as fruits, vegetables, and grains.  Do not take Tylenol if you are taking prescription pain medications.  Follow up Your surgeon may want to see you in the office following your access surgery. If so, this will be arranged at the time of your surgery.  Please call us immediately for any of the following conditions:  Increased pain, redness, drainage (pus) from your incision site Fever of 101 degrees or higher Severe or worsening pain at your incision site Hand pain or numbness.  Reduce your risk of vascular disease:  Stop smoking. If you would like help, call [facility] at 1-800-QUIT-NOW ((214)884-5277) or [place] at 9715164263  Manage your cholesterol Maintain a desired weight Control your diabetes Keep your blood pressure down  Dialysis  It will take several weeks to several months for your new dialysis access to be ready for use. Your surgeon will determine when it is okay to use it. Your nephrologist will continue to direct your dialysis. You can continue to use your Permcath until your new access is ready for use.   12/01/2022 Linda Black [id number] 10/19/1970  Surgeon(s): [name], MD  Procedure(s): LEFT ARM SECOND STAGE BASILIC VEIN TRANSPOSITION   May stick graft immediately   May stick graft on designated area only:   X Do not stick left AV fistula for 6 weeks    If you have any questions, please call the  office at [phone].

## 2022-12-01 NOTE — Op Note (Signed)
DATE OF SERVICE: 12/01/2022  PATIENT:  Linda Black  52 y.o. female  PRE-OPERATIVE DIAGNOSIS:  CKD V  POST-OPERATIVE DIAGNOSIS:  Same  PROCEDURE:   Left second stage basilic vein transposition  SURGEON:  Surgeon(s) and Role:    * [name], MD - Primary  ASSISTANT: Nathanial Rancher, PA-C  An experienced assistant was required given the complexity of this procedure and the standard of surgical care. My assistant helped with exposure through counter tension, suctioning, ligation and retraction to better visualize the surgical field.  My assistant expedited sewing during the case by following my sutures. Wherever I use the term "we" in the report, my assistant actively helped me with that portion of the procedure.  ANESTHESIA:   general  EBL: minimal  BLOOD ADMINISTERED:none  DRAINS: none   LOCAL MEDICATIONS USED:  NONE  SPECIMEN:  none  COUNTS: confirmed correct.  TOURNIQUET:  none  PATIENT DISPOSITION:  PACU - hemodynamically stable.   Delay start of Pharmacological VTE agent (>24hrs) due to surgical blood loss or risk of bleeding: no  INDICATION FOR PROCEDURE: Linda Black is a 51 y.o. female with CKD V. I previously created a left basilic vein arteriovenous fistula for her 10/13/22. This has matured well. After careful discussion of risks, benefits, and alternatives the patient was offered transposition of the fistula. The patient understood and wished to proceed.  OPERATIVE FINDINGS: healthy basilic vein fistula which has dilated nicely. Successful transposition. Good doppler flow in fistula and left radial artery at completion.  DESCRIPTION OF PROCEDURE: After identification of the patient in the pre-operative holding area, the patient was transferred to the operating room. The patient was positioned supine on the operating room table. Anesthesia was induced. The left arm was prepped and draped in standard fashion. A surgical pause was performed confirming correct  patient, procedure, and operative location.  Using intraoperative ultrasound the course of the left basilic vein was marked on the skin.  3 skip incisions were made over the course of the basilic vein fistula.  These were carried down through subcutaneous tissue until the fistula was encountered.  The fascia was skeletonized from the axilla to the anastomosis, taking care to ligate and divide sidebranches, and to protect the medial antebrachial cutaneous nerve.   A subcutaneous tunnel was created over the biceps using a sheath tunneling device.  Patient was heparinized.  The fistula near the anastomosis was clamped.  The outflow in the axilla was clamped.  The fistula was divided.  The fistula was marked to ensure no twisting or kinking while delivering the fistula through the tunnel.  The fistula was tunneled through the arm and delivered near the previous anastomosis.  The fistula was spatulated proximally and distally.  The fistula was reanastomosed end-to-end using continuous running suture of 5-0 Prolene.  A palpable thrill was felt over the subcutaneous course of the tunnel.  Doppler flow was excellent in the proximal and distal fistula.  The wounds were copiously irrigated.  Hemostasis was ensured in the surgical bed.  The wounds were closed in layers using 3-0 Vicryl and 4-0 Monocryl.  Upon completion of the case instrument and sharps counts were confirmed correct. The patient was transferred to the PACU in good condition. I was present for all portions of the procedure.  FOLLOW UP PLAN: Assuming a normal postoperative course, a VVS PA will see the patient in 4-6 weeks with AVF duplex.   Rande Brunt. Lenell Antu, MD FACS [facility]  Office Phone Number: [phone] 12/01/2022 9:30 AM

## 2022-12-02 ENCOUNTER — Encounter (HOSPITAL_COMMUNITY): Payer: Self-pay | Admitting: Vascular Surgery

## 2022-12-02 NOTE — Anesthesia Postprocedure Evaluation (Signed)
Anesthesia Post Note  Patient: Linda Black  Procedure(s) Performed: LEFT ARM SECOND STAGE BASILIC VEIN TRANSPOSITION (Left)     Patient location during evaluation: PACU Anesthesia Type: General Level of consciousness: sedated and patient cooperative Pain management: pain level controlled Vital Signs Assessment: post-procedure vital signs reviewed and stable Respiratory status: spontaneous breathing Cardiovascular status: stable Anesthetic complications: no   No notable events documented.  Last Vitals:  Vitals:   12/01/22 1030 12/01/22 1045  BP: (!) 153/79 (!) 156/88  Pulse: 60 67  Resp: 13 14  Temp:  36.5 C  SpO2: 93%     Last Pain:  Vitals:   12/01/22 1045  TempSrc:   PainSc: 4                  Letitia Sabala [employer]

## 2022-12-03 ENCOUNTER — Telehealth: Payer: Self-pay | Admitting: Physician Assistant

## 2022-12-03 LAB — LAB REPORT - SCANNED: EGFR: 13

## 2022-12-03 NOTE — Telephone Encounter (Signed)
-----   Message from Graceann Congress, [place] sent at 12/01/2022  9:34 AM EDT ----- S/p left BV transposition by Dr. Lenell Antu. Please have her follow up in 3-4 weeks for incision check. Thank you

## 2022-12-18 ENCOUNTER — Other Ambulatory Visit: Payer: Self-pay | Admitting: Medical

## 2022-12-24 ENCOUNTER — Ambulatory Visit: Payer: Medicaid Other | Admitting: Medical

## 2022-12-24 ENCOUNTER — Encounter: Payer: Self-pay | Admitting: Nephrology

## 2022-12-24 VITALS — BP 140/90 | HR 82 | Wt 192.8 lb

## 2022-12-24 DIAGNOSIS — Z79899 Other long term (current) drug therapy: Secondary | ICD-10-CM

## 2022-12-24 DIAGNOSIS — R609 Edema, unspecified: Secondary | ICD-10-CM | POA: Diagnosis not present

## 2022-12-24 DIAGNOSIS — E1149 Type 2 diabetes mellitus with other diabetic neurological complication: Secondary | ICD-10-CM

## 2022-12-24 DIAGNOSIS — I10 Essential (primary) hypertension: Secondary | ICD-10-CM

## 2022-12-24 DIAGNOSIS — E785 Hyperlipidemia, unspecified: Secondary | ICD-10-CM

## 2022-12-24 MED ORDER — METOPROLOL SUCCINATE ER 25 MG PO TB24: 25.0000 mg | ORAL_TABLET | Freq: Every day | ORAL | 2 refills | Status: DC

## 2022-12-24 MED ORDER — LOSARTAN POTASSIUM 100 MG PO TABS: ORAL_TABLET | ORAL | 2 refills | Status: DC

## 2022-12-24 MED ORDER — AMLODIPINE BESYLATE 10 MG PO TABS: 10.0000 mg | ORAL_TABLET | Freq: Every day | ORAL | 2 refills | Status: DC

## 2022-12-24 MED ORDER — FUROSEMIDE 40 MG PO TABS: 40.0000 mg | ORAL_TABLET | Freq: Two times a day (BID) | ORAL | 2 refills | Status: DC

## 2022-12-24 MED ORDER — ROSUVASTATIN CALCIUM 40 MG PO TABS: 40.0000 mg | ORAL_TABLET | Freq: Every day | ORAL | 3 refills | Status: DC

## 2022-12-24 MED ORDER — LEVOTHYROXINE SODIUM 25 MCG PO TABS: ORAL_TABLET | ORAL | 1 refills | Status: DC

## 2022-12-24 MED ORDER — CALCITRIOL 0.25 MCG PO CAPS: 0.2500 ug | ORAL_CAPSULE | Freq: Every day | ORAL | 3 refills | Status: DC

## 2022-12-24 NOTE — Progress Notes (Signed)
Subjective:  Linda Black is a 52 y.o. female who presents for Chief Complaint  Patient presents with   Hypertension    BP has been elevated/ . Needs refills on medications      Medical team: Dr. Vira Browns, orthopedics Dr. Terrace Arabia, endocrinology Dr. Annie Sable, nephrology Not currently seeing eye doctor and dentist Timi Reeser, Kermit Balo, PA-C here for primary care   Concerns: Here for BP concerns.    Here with daughter Linda Black.   BPs not controlled.  Currently out of losartan (pill bottle shows 10/09/22) although refill was sent 11/26/22 for 90 days.   She is compliant with Lasix 40 BID, amlodipine 10mg  daily.  Was on Toprol but out of that  Compliant with rest of medications.   No chest pain, palpations, edema.    No other aggravating or relieving factors.    No other c/o.  Past Medical History:  Diagnosis Date   Anxiety    Diabetes mellitus without complication (HCC)    Dr. Lonzo Cloud - diet controlled type 2   Fatty liver 2019   elevated LFTs, negative for Hep B and Hep C 04/2018   GERD (gastroesophageal reflux disease)    Heart murmur    Hepatitis    Hyperlipidemia    Hypertension    Hypothyroidism 2019   IgA nephropathy 2019   per biopsy, Dr. Annie Sable; Stage 4 ckd   Language barrier    Lumbar stenosis    Dr. Otelia Sergeant   Obesity    Osteoarthritis    Proteinuria    Current Outpatient Medications on File Prior to Visit  Medication Sig Dispense Refill   furosemide (LASIX) 40 MG tablet Take 40 mg by mouth 2 (two) times daily.     rifampin (RIFADIN) 300 MG capsule Take 600 mg by mouth daily.     Accu-Chek Softclix Lancets lancets Use as instructed 100 each 12   acetaminophen (TYLENOL) 500 MG tablet Take 1,000 mg by mouth daily as needed for moderate pain.     glucose blood (ACCU-CHEK AVIVA PLUS) test strip Test daily 100 strip 3   No current facility-administered medications on file prior to visit.     The following portions of the  patient's history were reviewed and updated as appropriate: allergies, current medications, past family history, past medical history, past social history, past surgical history and problem list.  ROS Otherwise as in subjective above    Objective: BP (!) 140/90   Pulse 82   Wt 192 lb 12.8 oz (87.5 kg)   LMP 03/23/2017   BMI 37.65 kg/m   BP Readings from Last 3 Encounters:  12/24/22 (!) 140/90  12/01/22 (!) 156/88  11/25/22 (!) 193/97   General appearance: alert, no distress, well developed, well nourished Heart: RRR, normal S1, S2, no murmurs Lungs: CTA bilaterally, no wheezes, rhonchi, or rales Pulses: 2+ radial pulses, 2+ pedal pulses, normal cap refill Ext: no edema   09/22/22 (care everywhere from [facility]) PROCEDURE  Image Quality  Excellent. A limited two-dimensional transthoracic  echocardiogram was performed  2D .  -  SUMMARY  The left ventricular size is normal with mild concentric hypertrophy.  Left ventricular systolic function is normal.  LV ejection fraction = 60-65%.  The left ventricular wall motion is normal on contrasted views.  Mild paradoxical septal motion.  The right ventricle is normal in size and function.  There is no pericardial effusion.   Compared to prior study, the LV endocardium was better visualized  with  contrast. LV function is normal.     Assessment: Encounter Diagnoses  Name Primary?   Essential hypertension Yes   Edema, unspecified type    Type 2 diabetes mellitus with neurological complications (HCC)    High risk medication use    Hyperlipidemia, unspecified hyperlipidemia type      Plan: We discussed her medications, her recent elevated blood pressures.  Looking at her bag of pill bottles today she is out of Toprol and has been out of losartan.  Interestingly enough the losartan has been available at the pharmacy for refill but apparently one of her daughters did not communicate with the other daughter that she had  refills.  So she has actually not been taking 2 of her blood pressure pills for the past several weeks  I called and spoke to Dr. Kathrene Bongo, nephrology at [facility] just to make sure we were in congruence with her regimen.  We will continue the same regimen.  The big issue here is both compliance and communication and language barrier to some extent.  Follow-up with nephrology as scheduled soon  Continue other medicines as usual   Amany was seen today for hypertension.  Diagnoses and all orders for this visit:  Essential hypertension  Edema, unspecified type  Type 2 diabetes mellitus with neurological complications (HCC)  High risk medication use  Hyperlipidemia, unspecified hyperlipidemia type  Other orders -     amLODipine (NORVASC) 10 MG tablet; Take 1 tablet (10 mg total) by mouth daily. -     calcitRIOL (ROCALTROL) 0.25 MCG capsule; Take 1 capsule (0.25 mcg total) by mouth daily. -     furosemide (LASIX) 40 MG tablet; Take 1 tablet (40 mg total) by mouth 2 (two) times daily. -     levothyroxine (SYNTHROID) 25 MCG tablet; TAKE 1 TABLET(25 MCG) BY MOUTH DAILY BEFORE BREAKFAST -     losartan (COZAAR) 100 MG tablet; TAKE 1 TABLET(100 MG) BY MOUTH DAILY -     rosuvastatin (CRESTOR) 40 MG tablet; Take 1 tablet (40 mg total) by mouth daily. -     metoprolol succinate (TOPROL-XL) 25 MG 24 hr tablet; Take 1 tablet (25 mg total) by mouth daily.   Spent > 30 minutes face to face with patient in discussion of symptoms, evaluation, plan and recommendations.     Follow up: pending call back

## 2022-12-24 NOTE — Progress Notes (Signed)
Spoke with Linda Black and advised her

## 2023-01-01 ENCOUNTER — Other Ambulatory Visit: Payer: Self-pay | Admitting: Medical

## 2023-01-06 ENCOUNTER — Ambulatory Visit (INDEPENDENT_AMBULATORY_CARE_PROVIDER_SITE_OTHER): Payer: Medicaid Other | Admitting: Physician Assistant

## 2023-01-06 VITALS — BP 158/83 | HR 65 | Temp 98.0°F | Wt 161.0 lb

## 2023-01-06 DIAGNOSIS — N185 Chronic kidney disease, stage 5: Secondary | ICD-10-CM

## 2023-01-06 NOTE — Progress Notes (Signed)
  POST OPERATIVE OFFICE NOTE    CC:  F/u for surgery  HPI:  This is a 52 y.o. female who is s/p second stage basilic on 12/01/22, first stage 10/13/22 by Lenell Antu for CKD.  She is not requiring HD at this time.  Pt returns today for follow up.  Pt states she has no pain or weakness in the left UE.     Allergies  Allergen Reactions   Lisinopril Swelling    Angioedema    Covid-19 (Mrna) Vaccine Hives    Got quite sick after both initial doses   Other Swelling    Ree Kida Fruit    Current Outpatient Medications  Medication Sig Dispense Refill   Accu-Chek Softclix Lancets lancets Use as instructed 100 each 12   acetaminophen (TYLENOL) 500 MG tablet Take 1,000 mg by mouth daily as needed for moderate pain.     amLODipine (NORVASC) 10 MG tablet Take 1 tablet (10 mg total) by mouth daily. 90 tablet 2   calcitRIOL (ROCALTROL) 0.25 MCG capsule Take 1 capsule (0.25 mcg total) by mouth daily. 90 capsule 3   furosemide (LASIX) 40 MG tablet Take 40 mg by mouth 2 (two) times daily.     furosemide (LASIX) 40 MG tablet Take 1 tablet (40 mg total) by mouth 2 (two) times daily. 180 tablet 2   glucose blood (ACCU-CHEK AVIVA PLUS) test strip Test daily 100 strip 3   levothyroxine (SYNTHROID) 25 MCG tablet TAKE 1 TABLET(25 MCG) BY MOUTH DAILY BEFORE BREAKFAST 90 tablet 1   losartan (COZAAR) 100 MG tablet TAKE 1 TABLET(100 MG) BY MOUTH DAILY 90 tablet 2   metoprolol succinate (TOPROL-XL) 25 MG 24 hr tablet Take 1 tablet (25 mg total) by mouth daily. 90 tablet 2   rifampin (RIFADIN) 300 MG capsule Take 600 mg by mouth daily.     rosuvastatin (CRESTOR) 40 MG tablet Take 1 tablet (40 mg total) by mouth daily. 90 tablet 3   No current facility-administered medications for this visit.     ROS:  See HPI  Physical Exam:    Incision:  well healed incisions left medial UE Extremities:  palpable thrill in fistula, radial pulse as well Lungs non labored breathing    Assessment/Plan:  This is a 52 y.o.  female who is s/p:second stage basilic transposition CKD not on HD yet.  If she needs HD the fistula may be accessed on 01/14/23.  She will f/u as needed.  Patent fistula with good thrill.    Mosetta Pigeon PA-C [facility] [phone]   Clinic MD:  Lenell Antu

## 2023-01-15 ENCOUNTER — Other Ambulatory Visit: Payer: Medicaid Other

## 2023-01-15 NOTE — Progress Notes (Unsigned)
Pt is stable on her weight and no intervention is needed

## 2023-01-28 LAB — LAB REPORT - SCANNED: EGFR: 8

## 2023-02-19 ENCOUNTER — Other Ambulatory Visit: Payer: Self-pay | Admitting: Medical

## 2023-02-20 NOTE — Telephone Encounter (Signed)
This was already filled in April for #180 with 2 refills

## 2023-03-24 LAB — LAB REPORT - SCANNED: EGFR: 7

## 2023-04-03 ENCOUNTER — Encounter: Payer: Self-pay | Admitting: Physical Medicine and Rehabilitation

## 2023-04-03 ENCOUNTER — Ambulatory Visit: Payer: Medicaid Other | Admitting: Physical Medicine and Rehabilitation

## 2023-04-03 VITALS — BP 157/88 | HR 59

## 2023-04-03 DIAGNOSIS — M5442 Lumbago with sciatica, left side: Secondary | ICD-10-CM | POA: Diagnosis not present

## 2023-04-03 DIAGNOSIS — M5416 Radiculopathy, lumbar region: Secondary | ICD-10-CM | POA: Diagnosis not present

## 2023-04-03 DIAGNOSIS — M5441 Lumbago with sciatica, right side: Secondary | ICD-10-CM

## 2023-04-03 DIAGNOSIS — G8929 Other chronic pain: Secondary | ICD-10-CM

## 2023-04-03 DIAGNOSIS — M48062 Spinal stenosis, lumbar region with neurogenic claudication: Secondary | ICD-10-CM

## 2023-04-03 NOTE — Progress Notes (Unsigned)
Linda Black - 52 y.o. female MRN [id number]  Date of birth: 1970/11/04  Office Visit Note: Visit Date: 04/03/2023 PCP: Linda Canavan, PA-C Referred by: Linda Canavan, PA-C  Subjective: Chief Complaint  Patient presents with   Lower Back - Pain, Numbness, Weakness   Left Leg - Pain, Numbness, Weakness   Right Leg - Pain, Numbness, Weakness   HPI: Linda Black is a 53 y.o. female who comes in today for evaluation of chronic, worsening and severe bilateral lower back pain radiating down posterolateral legs to feet. Prior patient of Dr. Vira Browns. [place] interpreter at bedside. Pain ongoing for several years. Her pain becomes worse with standing and walking, she describes as sore and aching, currently rates as 8 out of 10. Some relief of pain with home exercise regimen, rest and use of medications. No history of formal physical therapy for lower back issues. Lumbar MRI imaging from 2021 exhibits moderate central canal stenosis and bilateral lateral recess and foraminal stenosis at L4-L5. Patient underwent multiple injections in our office in 2021, most recent was right L4-L5 interlaminar epidural steroid injection on 01/19/2020. She reports greater than 50% relief of pain for several months with this procedure, she also reports increased functional ability post injection. Patient denies focal weakness, numbness and tingling. No recent trauma or falls.    Oswestry Disability Index Score 44% 20 to 30 (60%) severe disability: Pain remains the main problem in this group but activities of daily living are affected. These patients require a detailed investigation.  Review of Systems  Musculoskeletal:  Positive for back pain.  Neurological:  Negative for tingling, sensory change, focal weakness and weakness.  All other systems reviewed and are negative.  Otherwise per HPI.  Assessment & Plan: Visit Diagnoses:    ICD-10-CM   1. Lumbar radiculopathy  M54.16 Ambulatory referral to  Physical Medicine Rehab    2. Chronic bilateral low back pain with bilateral sciatica  M54.42 Ambulatory referral to Physical Medicine Rehab   M54.41    G89.29     3. Spinal stenosis of lumbar region with neurogenic claudication  M48.062 Ambulatory referral to Physical Medicine Rehab       Plan: Findings:  Chronic, worsening and severe bilateral lower back pain radiating down posterolateral legs to feet. Patient continues to have severe pain despite good conservative therapies such as home exercise regimen, rest and use of medications. Patients clinical presentation and exam are fit with L5 nerve pattern. Her symptoms are consistent with neurogenic claudication, likely from worsening central central stenosis at L4-L5. Next step is to perform left L5-S1 interlaminar epidural steroid injection under fluoroscopic guidance. She is not currently taking anticoagulant medications. If good relief of pain with injection we can repeat this procedure infrequently as needed. She has no questions about injection procedure at this time. If her pain persists post injection we would be quick to order new lumbar MRI imaging. No red flag symptoms noted upon exam today.     Meds & Orders: No orders of the defined types were placed in this encounter.   Orders Placed This Encounter  Procedures   Ambulatory referral to Physical Medicine Rehab    Follow-up: Return for Left L5-S1 interlaminar epidural steroid injection.   Procedures: No procedures performed      Clinical History: Narrative & Impression CLINICAL DATA:  Low back pain and bilateral lower extremity radiculopathy   EXAM: MRI LUMBAR SPINE WITHOUT CONTRAST   TECHNIQUE: Multiplanar, multisequence MR imaging of the  lumbar spine was performed. No intravenous contrast was administered.   COMPARISON:  None.   FINDINGS: Segmentation:  Standard.   Alignment:  No significant listhesis.   Vertebrae: Vertebral body heights are maintained. There  is no significant marrow edema or suspicious osseous lesion identified   Conus medullaris and cauda equina: Conus extends to the L1 level. Conus and cauda equina appear normal.   Paraspinal and other soft tissues: Unremarkable.   Disc levels:   L1-L2:  No significant canal or foraminal stenosis.   L2-L3:  No significant canal or foraminal stenosis.   L3-L4: Disc bulge and facet arthropathy with ligamentum flavum infolding. Mild canal stenosis. No significant foraminal stenosis.   L4-L5: Disc bulge with superimposed central disc protrusion and annular fissure. Endplate osteophytic ridging and facet arthropathy with ligamentum flavum infolding. Moderate canal stenosis with narrowing of lateral recesses, left greater than right. Mild to moderate foraminal stenosis, left greater than right.   L5-S1: Disc bulge with right subarticular annular fissure and facet arthropathy with ligamentum flavum infolding. No significant canal stenosis with partial effacement of the right lateral recess. Minor foraminal stenosis.   IMPRESSION: Multilevel degenerative changes as detailed above, greatest at L4-L5 and L5-S1.     Electronically Signed   By: Guadlupe Spanish M.D.   On: 08/24/2019 09:25   She reports that she has never smoked. She has never used smokeless tobacco. No results for input(s): "HGBA1C", "LABURIC" in the last 8760 hours.  Objective:  VS:  HT:    WT:   BMI:     BP:(!) 157/88  HR:(!) 59bpm  TEMP: ( )  RESP:  Physical Exam Vitals and nursing note reviewed.  HENT:     Head: Normocephalic and atraumatic.     Right Ear: External ear normal.     Left Ear: External ear normal.     Nose: Nose normal.     Mouth/Throat:     Mouth: Mucous membranes are moist.  Eyes:     Extraocular Movements: Extraocular movements intact.  Cardiovascular:     Rate and Rhythm: Normal rate.     Pulses: Normal pulses.  Pulmonary:     Effort: Pulmonary effort is normal.  Abdominal:      General: Abdomen is flat. There is no distension.  Musculoskeletal:        General: Tenderness present.     Cervical back: Normal range of motion.     Comments: Patient rises from seated position to standing without difficulty. Good lumbar range of motion. No pain noted with facet loading. 5/5 strength noted with bilateral hip flexion, knee flexion/extension, ankle dorsiflexion/plantarflexion and EHL. No clonus noted bilaterally. No pain upon palpation of greater trochanters. No pain with internal/external rotation of bilateral hips. Sensation intact bilaterally. Dysesthesias noted to bilateral L5 dermatomes. Negative slump test bilaterally. Ambulates without aid, gait steady.     Skin:    General: Skin is warm and dry.     Capillary Refill: Capillary refill takes less than 2 seconds.  Neurological:     General: No focal deficit present.     Mental Status: She is alert and oriented to person, place, and time.  Psychiatric:        Mood and Affect: Mood normal.        Behavior: Behavior normal.     Ortho Exam  Imaging: No results found.  Past Medical/Family/Surgical/Social History: Medications & Allergies reviewed per EMR, new medications updated. Patient Active Problem List   Diagnosis Date Noted  Sleep disturbance 03/19/2021   Primary osteoarthritis of right knee 02/11/2021   Status post total right knee replacement 02/11/2021   Chronic low back pain with sciatica 09/18/2020   Encounter for health maintenance examination in adult 01/09/2020   Radicular pain of right lower extremity 04/18/2019   Other intervertebral disc degeneration, lumbar region 04/18/2019   Radiculopathy of lumbosacral region 03/21/2019   Right leg pain 03/21/2019   Neuropathy involving both lower extremities 09/23/2018   Screen for colon cancer 05/17/2018   Vaccine counseling 05/17/2018   Fatty liver 05/17/2018   Screening for cervical cancer 05/17/2018   Hypothyroidism 05/17/2018   Need for pneumococcal  vaccination 05/17/2018   Generalized abdominal tenderness 05/17/2018   Need for influenza vaccination 04/13/2018   Other fatigue 02/08/2018   Edema 01/25/2018   Renal failure (ARF), acute on chronic (HCC) 01/20/2018   Normocytic normochromic anemia 01/20/2018   Tremor 01/05/2018   Vitamin D deficiency 01/05/2018   RUQ abdominal pain 10/27/2017   Hemorrhoids 10/27/2017   High risk medication use 09/25/2017   Type 2 diabetes mellitus with neurological complications (HCC) 09/02/2017   Proteinuria 09/02/2017   Paresthesia 09/02/2017   IgA nephropathy 06/10/2017   Language barrier 06/10/2017   Gastroesophageal reflux disease 06/10/2017   SOB (shortness of breath) 02/24/2017   Hyperlipidemia 04/21/2016   Chronic right-sided low back pain with right-sided sciatica 01/29/2016   Morbid obesity (HCC) 01/29/2016   Hypokalemia 01/29/2016   Essential hypertension 01/29/2016   Past Medical History:  Diagnosis Date   Anxiety    Diabetes mellitus without complication (HCC)    Dr. Lonzo Cloud - diet controlled type 2   Fatty liver 2019   elevated LFTs, negative for Hep B and Hep C 04/2018   GERD (gastroesophageal reflux disease)    Heart murmur    Hepatitis    Hyperlipidemia    Hypertension    Hypothyroidism 2019   IgA nephropathy 2019   per biopsy, Dr. Annie Sable; Stage 4 ckd   Language barrier    Lumbar stenosis    Dr. Otelia Sergeant   Obesity    Osteoarthritis    Proteinuria    Family History  Problem Relation Age of Onset   Diabetes Mother    Hypertension Father    Colon cancer Neg Hx    Stomach cancer Neg Hx    Esophageal cancer Neg Hx    Rectal cancer Neg Hx    Liver cancer Neg Hx    Past Surgical History:  Procedure Laterality Date   AV FISTULA PLACEMENT Left 10/13/2022   Procedure: LEFT ARM STAGE 1 BRACHIOBASILIC FISTULA CREATION;  Surgeon: [name], MD;  Location: [facility] OR;  Service: Vascular;  Laterality: Left;   BASCILIC VEIN TRANSPOSITION Left 12/01/2022    Procedure: LEFT ARM SECOND STAGE BASILIC VEIN TRANSPOSITION;  Surgeon: [name], MD;  Location: [facility] OR;  Service: Vascular;  Laterality: Left;   RENAL BIOPSY  2018   IgA nephropathy, Dr. Annie Sable   TOTAL KNEE ARTHROPLASTY Right 02/11/2021   Procedure: RIGHT TOTAL KNEE ARTHROPLASTY;  Surgeon: Tarry Kos, MD;  Location: [facility] OR;  Service: Orthopedics;  Laterality: Right;   Social History   Occupational History   Occupation: homemaker  Tobacco Use   Smoking status: Never   Smokeless tobacco: Never  Vaping Use   Vaping status: Never Used  Substance and Sexual Activity   Alcohol use: Never   Drug use: Never   Sexual activity: Not on file

## 2023-04-03 NOTE — Progress Notes (Unsigned)
Functional Pain Scale - descriptive words and definitions  Distracting (5)    Aware of pain/able to complete some ADL's but limited by pain/sleep is affected and active distractions are only slightly useful. Moderate range order  Average Pain 5  Pain in lower back that goes down into both legs and feet. Her feet get warm. Symptoms for about a month. Pain is preventing her from exercising.

## 2023-04-20 ENCOUNTER — Ambulatory Visit (INDEPENDENT_AMBULATORY_CARE_PROVIDER_SITE_OTHER): Payer: Medicaid Other | Admitting: Physical Medicine and Rehabilitation

## 2023-04-20 ENCOUNTER — Other Ambulatory Visit: Payer: Self-pay

## 2023-04-20 ENCOUNTER — Encounter: Payer: Self-pay | Admitting: Physical Medicine and Rehabilitation

## 2023-04-20 VITALS — BP 154/84 | HR 71

## 2023-04-20 DIAGNOSIS — M5416 Radiculopathy, lumbar region: Secondary | ICD-10-CM | POA: Diagnosis not present

## 2023-04-20 MED ORDER — METHYLPREDNISOLONE ACETATE 80 MG/ML IJ SUSP
80.0000 mg | Freq: Once | INTRAMUSCULAR | Status: AC
  Administered 2023-04-20: 80 mg

## 2023-04-20 NOTE — Patient Instructions (Signed)

## 2023-04-20 NOTE — Procedures (Signed)
Lumbar Epidural Steroid Injection - Interlaminar Approach with Fluoroscopic Guidance  Patient: Linda Black      Date of Birth: 01/13/1971 MRN: [id number] PCP: Jac Canavan, PA-C      Visit Date: 04/20/2023   Universal Protocol:     Consent Given By: the patient  Position: PRONE  Additional Comments: Vital signs were monitored before and after the procedure. Patient was prepped and draped in the usual sterile fashion. The correct patient, procedure, and site was verified.   Injection Procedure Details:   Procedure diagnoses: Lumbar radiculopathy [M54.16]   Meds Administered:  Meds ordered this encounter  Medications   methylPREDNISolone acetate (DEPO-MEDROL) injection 80 mg     Laterality: Left  Location/Site:  L5-S1  Needle: 3.5 in., 20 ga. Tuohy  Needle Placement: Paramedian epidural  Findings:   -Comments: Excellent flow of contrast into the epidural space.  Procedure Details: Using a paramedian approach from the side mentioned above, the region overlying the inferior lamina was localized under fluoroscopic visualization and the soft tissues overlying this structure were infiltrated with 4 ml. of 1% Lidocaine without Epinephrine. The Tuohy needle was inserted into the epidural space using a paramedian approach.   The epidural space was localized using loss of resistance along with counter oblique bi-planar fluoroscopic views.  After negative aspirate for air, blood, and CSF, a 2 ml. volume of Isovue-250 was injected into the epidural space and the flow of contrast was observed. Radiographs were obtained for documentation purposes.    The injectate was administered into the level noted above.   Additional Comments:  The patient tolerated the procedure well Dressing: 2 x 2 sterile gauze and Band-Aid    Post-procedure details: Patient was observed during the procedure. Post-procedure instructions were reviewed.  Patient left the clinic in stable  condition.

## 2023-04-20 NOTE — Progress Notes (Signed)
Linda Black - 52 y.o. female MRN [id number]  Date of birth: 11-18-1970  Office Visit Note: Visit Date: 04/20/2023 PCP: Jac Canavan, PA-C Referred by: Jac Canavan, PA-C  Subjective: Chief Complaint  Patient presents with   Lower Back - Pain   HPI:  Linda Black is a 52 y.o. female who comes in today at the request of Ellin Goodie, FNP for planned Left L5-S1 Lumbar Interlaminar epidural steroid injection with fluoroscopic guidance.  The patient has failed conservative care including home exercise, medications, time and activity modification.  This injection will be diagnostic and hopefully therapeutic.  Please see requesting physician notes for further details and justification.   ROS Otherwise per HPI.  Assessment & Plan: Visit Diagnoses:    ICD-10-CM   1. Lumbar radiculopathy  M54.16 XR C-ARM NO REPORT    Epidural Steroid injection    methylPREDNISolone acetate (DEPO-MEDROL) injection 80 mg      Plan: No additional findings.   Meds & Orders:  Meds ordered this encounter  Medications   methylPREDNISolone acetate (DEPO-MEDROL) injection 80 mg    Orders Placed This Encounter  Procedures   XR C-ARM NO REPORT   Epidural Steroid injection    Follow-up: Return if symptoms worsen or fail to improve.   Procedures: No procedures performed  Lumbar Epidural Steroid Injection - Interlaminar Approach with Fluoroscopic Guidance  Patient: Linda Black      Date of Birth: March 07, 1971 MRN: [id number] PCP: Jac Canavan, PA-C      Visit Date: 04/20/2023   Universal Protocol:     Consent Given By: the patient  Position: PRONE  Additional Comments: Vital signs were monitored before and after the procedure. Patient was prepped and draped in the usual sterile fashion. The correct patient, procedure, and site was verified.   Injection Procedure Details:   Procedure diagnoses: Lumbar radiculopathy [M54.16]   Meds Administered:  Meds ordered this encounter   Medications   methylPREDNISolone acetate (DEPO-MEDROL) injection 80 mg     Laterality: Left  Location/Site:  L5-S1  Needle: 3.5 in., 20 ga. Tuohy  Needle Placement: Paramedian epidural  Findings:   -Comments: Excellent flow of contrast into the epidural space.  Procedure Details: Using a paramedian approach from the side mentioned above, the region overlying the inferior lamina was localized under fluoroscopic visualization and the soft tissues overlying this structure were infiltrated with 4 ml. of 1% Lidocaine without Epinephrine. The Tuohy needle was inserted into the epidural space using a paramedian approach.   The epidural space was localized using loss of resistance along with counter oblique bi-planar fluoroscopic views.  After negative aspirate for air, blood, and CSF, a 2 ml. volume of Isovue-250 was injected into the epidural space and the flow of contrast was observed. Radiographs were obtained for documentation purposes.    The injectate was administered into the level noted above.   Additional Comments:  The patient tolerated the procedure well Dressing: 2 x 2 sterile gauze and Band-Aid    Post-procedure details: Patient was observed during the procedure. Post-procedure instructions were reviewed.  Patient left the clinic in stable condition.   Clinical History: Narrative & Impression CLINICAL DATA:  Low back pain and bilateral lower extremity radiculopathy   EXAM: MRI LUMBAR SPINE WITHOUT CONTRAST   TECHNIQUE: Multiplanar, multisequence MR imaging of the lumbar spine was performed. No intravenous contrast was administered.   COMPARISON:  None.   FINDINGS: Segmentation:  Standard.   Alignment:  No significant listhesis.  Vertebrae: Vertebral body heights are maintained. There is no significant marrow edema or suspicious osseous lesion identified   Conus medullaris and cauda equina: Conus extends to the L1 level. Conus and cauda equina appear  normal.   Paraspinal and other soft tissues: Unremarkable.   Disc levels:   L1-L2:  No significant canal or foraminal stenosis.   L2-L3:  No significant canal or foraminal stenosis.   L3-L4: Disc bulge and facet arthropathy with ligamentum flavum infolding. Mild canal stenosis. No significant foraminal stenosis.   L4-L5: Disc bulge with superimposed central disc protrusion and annular fissure. Endplate osteophytic ridging and facet arthropathy with ligamentum flavum infolding. Moderate canal stenosis with narrowing of lateral recesses, left greater than right. Mild to moderate foraminal stenosis, left greater than right.   L5-S1: Disc bulge with right subarticular annular fissure and facet arthropathy with ligamentum flavum infolding. No significant canal stenosis with partial effacement of the right lateral recess. Minor foraminal stenosis.   IMPRESSION: Multilevel degenerative changes as detailed above, greatest at L4-L5 and L5-S1.     Electronically Signed   By: Guadlupe Spanish M.D.   On: 08/24/2019 09:25     Objective:  VS:  HT:    WT:   BMI:     BP:(!) 154/84  HR:71bpm  TEMP: ( )  RESP:  Physical Exam Vitals and nursing note reviewed.  Constitutional:      General: She is not in acute distress.    Appearance: Normal appearance. She is obese. She is not ill-appearing.  HENT:     Head: Normocephalic and atraumatic.     Right Ear: External ear normal.     Left Ear: External ear normal.  Eyes:     Extraocular Movements: Extraocular movements intact.  Cardiovascular:     Rate and Rhythm: Normal rate.     Pulses: Normal pulses.  Pulmonary:     Effort: Pulmonary effort is normal. No respiratory distress.  Abdominal:     General: There is no distension.     Palpations: Abdomen is soft.  Musculoskeletal:        General: Tenderness present.     Cervical back: Neck supple.     Right lower leg: No edema.     Left lower leg: No edema.     Comments: Patient has  good distal strength with no pain over the greater trochanters.  No clonus or focal weakness.  Skin:    Findings: No erythema, lesion or rash.  Neurological:     General: No focal deficit present.     Mental Status: She is alert and oriented to person, place, and time.     Sensory: No sensory deficit.     Motor: No weakness or abnormal muscle tone.     Coordination: Coordination normal.  Psychiatric:        Mood and Affect: Mood normal.        Behavior: Behavior normal.      Imaging: No results found.

## 2023-04-20 NOTE — Progress Notes (Signed)
Functional Pain Scale - descriptive words and definitions  Distracting (5)    Aware of pain/able to complete some ADL's but limited by pain/sleep is affected and active distractions are only slightly useful. Moderate range order  Average Pain 5   +Driver, -BT, -Dye Allergies.   Pain the lower back, into bilat legs, burning pain,

## 2023-06-24 LAB — HM MAMMOGRAPHY

## 2023-06-26 ENCOUNTER — Encounter: Payer: Self-pay | Admitting: Internal Medicine

## 2023-07-22 ENCOUNTER — Ambulatory Visit (INDEPENDENT_AMBULATORY_CARE_PROVIDER_SITE_OTHER): Payer: Medicaid Other | Admitting: Infectious Diseases

## 2023-07-22 ENCOUNTER — Other Ambulatory Visit: Payer: Self-pay

## 2023-07-22 ENCOUNTER — Encounter: Payer: Self-pay | Admitting: Infectious Diseases

## 2023-07-22 VITALS — BP 141/78 | HR 66 | Temp 98.1°F | Wt 190.0 lb

## 2023-07-22 DIAGNOSIS — Z227 Latent tuberculosis: Secondary | ICD-10-CM | POA: Diagnosis present

## 2023-07-22 DIAGNOSIS — Z758 Other problems related to medical facilities and other health care: Secondary | ICD-10-CM

## 2023-07-22 DIAGNOSIS — Z603 Acculturation difficulty: Secondary | ICD-10-CM | POA: Diagnosis not present

## 2023-07-22 NOTE — Progress Notes (Signed)
[facility]                                      [address], [place], [place], [address]                                               Phn. [phone]; Fax: [phone]                                                               Date: 07/22/23 Reason for Visit: Positive Quantiferon   HPI: Linda Black is a 52 y.o.old female with PMH of DM, HLD, HTN, Hypothyroidism, OA, Obesity, GERD, IgA Nephropathy who is referred for evaluation of latent TB and assess  need of treatment  Spoke to patient with the help of in person Spanish interpreter. She was seen at ID with [facility] 08/07/22 and was prescribed rifampin 600mg  po daily for 4 months. Patient could not tell me whether she took those medications or not. However her daughter is able to confirm that she took medications as prescribed without missing doses which is also confirmed by fill h/o. She showed me bottle of rifampin in her phone. No concerns while taking medications.   She is originally from [place] and in the [place] for the past 20 years. She reports she has a family history of lung cancer, with her father passing away from the disease three years ago. She denies any family history of tuberculosis. She reports being  on dialysis, which she reports as being painful.   Denies smoking, alcohol and IVDU. She is separated from her husband and lives with her daughter  She feels her face swells sometimes but no other symptoms.   ROS: Denies fever, chills, nightsweats, nausea, vomiting, diarrhea, abdominal pain, constipation, diarrhea, loss of appetite,  weight loss, recent hospitalizations, rashes, joint complaints, shortness of breath, cough, chest pain, headaches, GU complaints.   Current Outpatient Medications on File Prior to Visit  Medication Sig Dispense Refill   acetaminophen (TYLENOL) 500 MG tablet Take 1,000 mg by mouth daily as needed for  moderate pain.     amLODipine (NORVASC) 10 MG tablet Take 1 tablet (10 mg total) by mouth daily. 90 tablet 2   calcitRIOL (ROCALTROL) 0.25 MCG capsule Take 1 capsule (0.25 mcg total) by mouth daily. 90 capsule 3   furosemide (LASIX) 40 MG tablet Take 1 tablet (40 mg total) by mouth 2 (two) times daily. 180 tablet 2   levothyroxine (SYNTHROID) 25 MCG tablet TAKE 1 TABLET(25 MCG) BY MOUTH DAILY BEFORE BREAKFAST 90 tablet 1   losartan (COZAAR) 100 MG tablet TAKE 1 TABLET(100 MG) BY MOUTH DAILY 90 tablet 2   metoprolol succinate (TOPROL-XL) 25 MG 24 hr tablet Take 1 tablet (25 mg total) by mouth daily. 90 tablet 2   rosuvastatin (CRESTOR) 40 MG tablet Take 1 tablet (40 mg total) by mouth daily. 90 tablet 3   sevelamer carbonate (RENVELA) 800 MG tablet Take 800 mg by mouth 3 (three) times daily with meals.  Accu-Chek Softclix Lancets lancets Use as instructed (Patient not taking: Reported on 07/22/2023) 100 each 12   glucose blood (ACCU-CHEK AVIVA PLUS) test strip Test daily (Patient not taking: Reported on 07/22/2023) 100 strip 3   No current facility-administered medications on file prior to visit.    Allergies  Allergen Reactions   Lisinopril Swelling    Angioedema    Covid-19 (Mrna) Vaccine Hives    Got quite sick after both initial doses   Other Swelling    Wilhelmenia Blase   Past Medical History:  Diagnosis Date   Anxiety    Diabetes mellitus without complication (HCC)    Dr. Lonzo Cloud - diet controlled type 2   Fatty liver 2019   elevated LFTs, negative for Hep B and Hep C 04/2018   GERD (gastroesophageal reflux disease)    Heart murmur    Hepatitis    Hyperlipidemia    Hypertension    Hypothyroidism 2019   IgA nephropathy 2019   per biopsy, Dr. Annie Sable; Stage 4 ckd   Language barrier    Lumbar stenosis    Dr. Otelia Sergeant   Obesity    Osteoarthritis    Proteinuria    Past Surgical History:  Procedure Laterality Date   AV FISTULA PLACEMENT Left 10/13/2022    Procedure: LEFT ARM STAGE 1 BRACHIOBASILIC FISTULA CREATION;  Surgeon: [name], MD;  Location: [facility] OR;  Service: Vascular;  Laterality: Left;   BASCILIC VEIN TRANSPOSITION Left 12/01/2022   Procedure: LEFT ARM SECOND STAGE BASILIC VEIN TRANSPOSITION;  Surgeon: [name], MD;  Location: [facility] OR;  Service: Vascular;  Laterality: Left;   RENAL BIOPSY  2018   IgA nephropathy, Dr. Annie Sable   TOTAL KNEE ARTHROPLASTY Right 02/11/2021   Procedure: RIGHT TOTAL KNEE ARTHROPLASTY;  Surgeon: Tarry Kos, MD;  Location: [facility] OR;  Service: Orthopedics;  Laterality: Right;   Social History   Socioeconomic History   Marital status: Divorced    Spouse name: Not on file   Number of children: 5   Years of education: Not on file   Highest education level: Not on file  Occupational History   Occupation: homemaker  Tobacco Use   Smoking status: Never   Smokeless tobacco: Never  Vaping Use   Vaping status: Never Used  Substance and Sexual Activity   Alcohol use: Never   Drug use: Never   Sexual activity: Not on file  Other Topics Concern   Not on file  Social History Narrative   Lives with family, non english speaking, likes to garden.   01/2020   Social Drivers of Corporate investment banker Strain: Not on file  Food Insecurity: Not on file  Transportation Needs: Not on file  Physical Activity: Not on file  Stress: Not on file  Social Connections: Not on file  Intimate Partner Violence: Not on file   Family History  Problem Relation Age of Onset   Diabetes Mother    Hypertension Father    Colon cancer Neg Hx    Stomach cancer Neg Hx    Esophageal cancer Neg Hx    Rectal cancer Neg Hx    Liver cancer Neg Hx     Vitals  BP (!) 141/78   Pulse 66   Temp 98.1 F (36.7 C) (Temporal)   Wt 190 lb (86.2 kg)   LMP 03/23/2017   SpO2 98%   BMI 37.11 kg/m   Examination  Gen: Alert and oriented x 3, no acute  distress. Morbidly obese  HEENT: [place]/AT, no scleral  icterus, no pale conjunctivae, hearing normal, oral mucosa moist Neck: Supple Cardio: Regular rate and rhythm; +S1 and S2 Resp: CTAB GI: Soft, nontender, nondistended,  GU: Musc: Extremities: No pedal edema Skin: No obvious rashes Neuro: grossly non focal, awake, alert and oriented  Psych: Calm, cooperative   Laboratory     Latest Ref Rng & Units 12/01/2022    6:50 AM 10/13/2022    7:21 AM 02/12/2021    6:50 AM  CBC  WBC 4.0 - 10.5 K/uL   8.6   Hemoglobin 12.0 - 15.0 g/dL 40.9  81.1  91.4   Hematocrit 36.0 - 46.0 % 33.0  31.0  34.9   Platelets 150 - 400 K/uL   243       Latest Ref Rng & Units 12/01/2022    6:50 AM 10/13/2022    7:21 AM 03/19/2022    9:34 AM  CMP  Glucose 70 - 99 mg/dL 87  90  782   BUN 6 - 20 mg/dL 69  36  43   Creatinine 0.44 - 1.00 mg/dL 9.56  2.13  0.86   Sodium 135 - 145 mmol/L 142  141  138   Potassium 3.5 - 5.1 mmol/L 4.5  4.3  4.3   Chloride 98 - 111 mmol/L 115  116  100   CO2 20 - 29 mmol/L   19   Calcium 8.7 - 10.2 mg/dL   9.2    Pertinent Imaging  Assessment/Plan: # Latent TB  - S/p completion of tx with Rifampin 600 mg po daily for 4 months, prescribed by [facility], 08/07/22 - Confirmed with daughter - fu as needed   # Language barrier - used in person interpreter for communication   I have personally spent 60 minutes involved in face-to-face and non-face-to-face activities for this patient on the day of the visit. Professional time spent includes the following activities: Preparing to see the patient (review of tests), Obtaining and/or reviewing separately obtained history (admission/discharge record), Performing a medically appropriate examination and/or evaluation , Ordering medications/tests/procedures, referring and communicating with other health care professionals, Documenting clinical information in the EMR, Independently interpreting results (not separately reported), Communicating results to the patient/family/caregiver,  Counseling and educating the patient/family/caregiver and Care coordination (not separately reported).    Patients questions were addressed and answered.   Of note, portions of this note may have been created with voice recognition software. While this note has been edited for accuracy, occasional wrong-word or 'sound-a-like' substitutions may have occurred due to the inherent limitations of voice recognition software.   Electronically signed by:  Odette Fraction, MD Infectious Diseases  Office phone [phone] Fax no. [phone]

## 2023-07-23 DIAGNOSIS — Z227 Latent tuberculosis: Secondary | ICD-10-CM | POA: Insufficient documentation

## 2023-07-30 ENCOUNTER — Encounter (HOSPITAL_COMMUNITY): Payer: Self-pay

## 2023-07-30 ENCOUNTER — Emergency Department (HOSPITAL_COMMUNITY): Payer: Medicaid Other

## 2023-07-30 ENCOUNTER — Emergency Department (HOSPITAL_COMMUNITY)
Admission: EM | Admit: 2023-07-30 | Discharge: 2023-07-30 | Discharge status: Against Medical Advice | Payer: Medicaid Other | Attending: Emergency Medicine | Admitting: Emergency Medicine

## 2023-07-30 DIAGNOSIS — R42 Dizziness and giddiness: Secondary | ICD-10-CM | POA: Insufficient documentation

## 2023-07-30 DIAGNOSIS — R079 Chest pain, unspecified: Secondary | ICD-10-CM | POA: Insufficient documentation

## 2023-07-30 DIAGNOSIS — Z5321 Procedure and treatment not carried out due to patient leaving prior to being seen by health care provider: Secondary | ICD-10-CM | POA: Insufficient documentation

## 2023-07-30 LAB — CBC
HCT: 36.2 % (ref 36.0–46.0)
Hemoglobin: 12.3 g/dL (ref 12.0–15.0)
MCH: 31.5 pg (ref 26.0–34.0)
MCHC: 34 g/dL (ref 30.0–36.0)
MCV: 92.8 fL (ref 80.0–100.0)
Platelets: 271 10*3/uL (ref 150–400)
RBC: 3.9 MIL/uL (ref 3.87–5.11)
RDW: 12.8 % (ref 11.5–15.5)
WBC: 7.7 10*3/uL (ref 4.0–10.5)
nRBC: 0 % (ref 0.0–0.2)

## 2023-07-30 LAB — BASIC METABOLIC PANEL
Anion gap: 16 — ABNORMAL HIGH (ref 5–15)
BUN: 30 mg/dL — ABNORMAL HIGH (ref 6–20)
CO2: 28 mmol/L (ref 22–32)
Calcium: 9.7 mg/dL (ref 8.9–10.3)
Chloride: 93 mmol/L — ABNORMAL LOW (ref 98–111)
Creatinine, Ser: 4.17 mg/dL — ABNORMAL HIGH (ref 0.44–1.00)
GFR, Estimated: 12 mL/min — ABNORMAL LOW (ref >60)
Glucose, Bld: 93 mg/dL (ref 70–99)
Potassium: 3 mmol/L — ABNORMAL LOW (ref 3.5–5.1)
Sodium: 137 mmol/L (ref 135–145)

## 2023-07-30 LAB — TROPONIN I (HIGH SENSITIVITY): Troponin I (High Sensitivity): 4 ng/L (ref <18)

## 2023-07-30 NOTE — ED Provider Triage Note (Cosign Needed)
Emergency Medicine Provider Triage Evaluation Note  Linda Black , a 52 y.o. female  was evaluated in triage.  Pt complains of chest pain and dizziness.  Review of Systems  Positive:  Negative:   Physical Exam  LMP 03/23/2017  Gen:   Awake, no distress   Resp:  Normal effort  MSK:   Moves extremities without difficulty  Other:    Medical Decision Making  Medically screening exam initiated at 2:45 PM.  Appropriate orders placed.  Linda Black was informed that the remainder of the evaluation will be completed by another provider, this initial triage assessment does not replace that evaluation, and the importance of remaining in the ED until their evaluation is complete.  chest pain and dizziness since dialysis today. Was not able to complete dialysis because of chest pain.   Denies fever, dyspnea, cough, nausea, vomiting, diarrhea.    Linda Black, [place] 07/30/23 [phone]

## 2023-07-30 NOTE — ED Triage Notes (Signed)
Pt is coming from home after having chest pain before going to dialysis and during dialysis. Substernal chest pain that is present with inspiration and expiration. No recent cough or fevers, no other complaints at this time. She did complete her dialysis today  Medic vitals   162/92 78hr 18rr 98%ra 113bgl 324 asa po 0.1sl nitroglycerin

## 2023-07-30 NOTE — ED Notes (Signed)
Was taking pt back for blood work, and pt stated that she was going home.

## 2023-07-31 ENCOUNTER — Ambulatory Visit (INDEPENDENT_AMBULATORY_CARE_PROVIDER_SITE_OTHER): Payer: Medicaid Other | Admitting: Medical

## 2023-07-31 VITALS — BP 124/80 | HR 93 | Wt 188.0 lb

## 2023-07-31 DIAGNOSIS — E1149 Type 2 diabetes mellitus with other diabetic neurological complication: Secondary | ICD-10-CM | POA: Diagnosis not present

## 2023-07-31 DIAGNOSIS — Z603 Acculturation difficulty: Secondary | ICD-10-CM | POA: Diagnosis not present

## 2023-07-31 DIAGNOSIS — R079 Chest pain, unspecified: Secondary | ICD-10-CM

## 2023-07-31 DIAGNOSIS — E876 Hypokalemia: Secondary | ICD-10-CM

## 2023-07-31 DIAGNOSIS — E039 Hypothyroidism, unspecified: Secondary | ICD-10-CM

## 2023-07-31 DIAGNOSIS — I10 Essential (primary) hypertension: Secondary | ICD-10-CM

## 2023-07-31 DIAGNOSIS — E785 Hyperlipidemia, unspecified: Secondary | ICD-10-CM

## 2023-07-31 DIAGNOSIS — Z758 Other problems related to medical facilities and other health care: Secondary | ICD-10-CM

## 2023-07-31 MED ORDER — POTASSIUM CHLORIDE ER 10 MEQ PO TBCR: 10.0000 meq | EXTENDED_RELEASE_TABLET | Freq: Two times a day (BID) | ORAL | 2 refills | Status: DC

## 2023-07-31 NOTE — Progress Notes (Signed)
Subjective:  Linda Black is a 52 y.o. female who presents for Chief Complaint  Patient presents with   chest pain    Had dialysis yesterday and while there she had chest pain and back pain. Was in ER that done EKG but she sat too long and didn't want to wait. She said everytime she has Diaylsis she has chest pain but yesterday was worse     Here with daughter Linda Black who interprets  She is here for chest pain.  She went to the emergency department yesterday after dialysis due to chest pain.  She had evaluation, labs, chest x-ray, EKG, but left before being seen by the physician  She gets chest pains often during dialysis.   Typical pains started at 1:30pm, but dialysis started about 10:30am.   So a few hours into dialysis she gets the pains.  Yesterday had worse pain and some dyspnea, so EMS was called and transported her to the emergency dept.  Chest pains subsided about 3-4 hours after dialysis.    No pains or dyspnea on days not at dialysis.  She feels fine this morning.  She felt fine last night.  She is compliant with her other medicines.  No other aggravating or relieving factors.    No other c/o.  Past Medical History:  Diagnosis Date   Anxiety    Diabetes mellitus without complication (HCC)    Dr. Lonzo Cloud - diet controlled type 2   Fatty liver 2019   elevated LFTs, negative for Hep B and Hep C 04/2018   GERD (gastroesophageal reflux disease)    Heart murmur    Hepatitis    Hyperlipidemia    Hypertension    Hypothyroidism 2019   IgA nephropathy 2019   per biopsy, Dr. Annie Sable; Stage 4 ckd   Language barrier    Lumbar stenosis    Dr. Otelia Sergeant   Obesity    Osteoarthritis    Proteinuria    Current Outpatient Medications on File Prior to Visit  Medication Sig Dispense Refill   amLODipine (NORVASC) 10 MG tablet Take 1 tablet (10 mg total) by mouth daily. 90 tablet 2   calcitRIOL (ROCALTROL) 0.25 MCG capsule Take 1 capsule (0.25 mcg total) by mouth daily. 90  capsule 3   furosemide (LASIX) 40 MG tablet Take 1 tablet (40 mg total) by mouth 2 (two) times daily. 180 tablet 2   levothyroxine (SYNTHROID) 25 MCG tablet TAKE 1 TABLET(25 MCG) BY MOUTH DAILY BEFORE BREAKFAST 90 tablet 1   losartan (COZAAR) 100 MG tablet TAKE 1 TABLET(100 MG) BY MOUTH DAILY 90 tablet 2   metoprolol succinate (TOPROL-XL) 25 MG 24 hr tablet Take 1 tablet (25 mg total) by mouth daily. 90 tablet 2   rosuvastatin (CRESTOR) 40 MG tablet Take 1 tablet (40 mg total) by mouth daily. 90 tablet 3   sevelamer carbonate (RENVELA) 800 MG tablet Take 800 mg by mouth 3 (three) times daily with meals.     Accu-Chek Softclix Lancets lancets Use as instructed (Patient not taking: Reported on 07/22/2023) 100 each 12   acetaminophen (TYLENOL) 500 MG tablet Take 1,000 mg by mouth daily as needed for moderate pain.     glucose blood (ACCU-CHEK AVIVA PLUS) test strip Test daily (Patient not taking: Reported on 07/22/2023) 100 strip 3   No current facility-administered medications on file prior to visit.     The following portions of the patient's history were reviewed and updated as appropriate: allergies, current medications, past family  history, past medical history, past social history, past surgical history and problem list.  ROS Otherwise as in subjective above  Objective: BP 124/80   Pulse 93   Wt 188 lb (85.3 kg)   LMP 03/23/2017   SpO2 98%   BMI 36.72 kg/m   Wt Readings from Last 3 Encounters:  07/31/23 188 lb (85.3 kg)  07/22/23 190 lb (86.2 kg)  01/15/23 192 lb 12.8 oz (87.5 kg)    General appearance: alert, no distress, well developed, well nourished Neck: supple, no lymphadenopathy, no thyromegaly, no masses Heart: RRR, normal S1, S2, no murmurs Lungs: CTA bilaterally, no wheezes, rhonchi, or rales Abdomen: +bs, soft, non tender, non distended, no masses, no hepatomegaly, no splenomegaly Pulses: 2+ radial pulses, 2+ pedal pulses, normal cap refill Ext: no  edema  Diabetic Foot Exam - Simple   Simple Foot Form Diabetic Foot exam was performed with the following findings: Yes 07/31/2023  9:32 AM  Visual Inspection See comments: Yes Sensation Testing See comments: Yes Pulse Check Posterior Tibialis and Dorsalis pulse intact bilaterally: Yes Comments Flat feet, no skin lesions, decreased sensation to monofilament bilateral feet     Assessment: Encounter Diagnoses  Name Primary?   Chest pain, unspecified type Yes   Essential hypertension    Type 2 diabetes mellitus with neurological complications (HCC)    Language barrier    Hypothyroidism, unspecified type    Hypokalemia    Hyperlipidemia, unspecified hyperlipidemia type      Plan: We discussed her symptoms and concerns.  I reviewed the emergency department notes yesterday including negative chest x-ray, unremarkable EKG with no new changes, labs showed low potassium and low chloride, there was also creatinine abnormal but she is on dialysis.  Troponin initial and blood count normal  Her symptoms resolved as of yesterday evening.  Given the findings we will have her begin back on potassium particularly since she takes Lasix twice a day.  Updated labs as below.    Hypothyroidism updated labs, continue levothyroxine 25 mcg daily  Kidney function and blood pressure managed by nephrology  Advise she see her eye doctor soon for diabetic eye exam   Melvenia was seen today for chest pain.  Diagnoses and all orders for this visit:  Chest pain, unspecified type  Essential hypertension  Type 2 diabetes mellitus with neurological complications (HCC) -     Hemoglobin A1c  Language barrier  Hypothyroidism, unspecified type -     TSH + free T4  Hypokalemia  Hyperlipidemia, unspecified hyperlipidemia type -     Lipid panel  Other orders -     potassium chloride (KLOR-CON) 10 MEQ tablet; Take 1 tablet (10 mEq total) by mouth 2 (two) times daily.    Follow up: pending  labs

## 2023-08-01 LAB — TSH+FREE T4
Free T4: 1.47 ng/dL (ref 0.82–1.77)
TSH: 2.25 u[IU]/mL (ref 0.450–4.500)

## 2023-08-01 LAB — HEMOGLOBIN A1C
Est. average glucose Bld gHb Est-mCnc: 117 mg/dL
Hgb A1c MFr Bld: 5.7 % — ABNORMAL HIGH (ref 4.8–5.6)

## 2023-08-01 LAB — LIPID PANEL
Chol/HDL Ratio: 2.2 {ratio} (ref 0.0–4.4)
Cholesterol, Total: 143 mg/dL (ref 100–199)
HDL: 65 mg/dL (ref >39)
LDL Chol Calc (NIH): 46 mg/dL (ref 0–99)
Triglycerides: 202 mg/dL — ABNORMAL HIGH (ref 0–149)
VLDL Cholesterol Cal: 32 mg/dL (ref 5–40)

## 2023-08-01 NOTE — Progress Notes (Signed)
Results sent through MyChart

## 2023-08-03 ENCOUNTER — Telehealth: Payer: Self-pay | Admitting: Physical Medicine and Rehabilitation

## 2023-08-03 NOTE — Telephone Encounter (Signed)
Patient called and said she needs an appointment for her back. The injection isnt working. CB#[phone]

## 2023-08-06 ENCOUNTER — Other Ambulatory Visit: Payer: Self-pay | Admitting: Physical Medicine and Rehabilitation

## 2023-08-06 DIAGNOSIS — M5416 Radiculopathy, lumbar region: Secondary | ICD-10-CM

## 2023-08-08 ENCOUNTER — Other Ambulatory Visit: Payer: Self-pay | Admitting: Medical

## 2023-08-19 ENCOUNTER — Encounter: Payer: Self-pay | Admitting: Physical Medicine and Rehabilitation

## 2023-08-28 ENCOUNTER — Ambulatory Visit
Admission: RE | Admit: 2023-08-28 | Discharge: 2023-08-28 | Disposition: A | Discharge status: Home / Self Care | Payer: Medicaid Other | Source: Ambulatory Visit | Attending: Physical Medicine and Rehabilitation | Admitting: Physical Medicine and Rehabilitation

## 2023-08-28 DIAGNOSIS — M5416 Radiculopathy, lumbar region: Secondary | ICD-10-CM

## 2023-09-08 ENCOUNTER — Telehealth: Payer: Self-pay | Admitting: Physical Medicine and Rehabilitation

## 2023-09-08 NOTE — Telephone Encounter (Signed)
Her MRI report came back and it shows moderate severe Stenosis and continued arthritis as prior 2021 MRI, shee needs f/u OV to review with [facility] and talk about plans.

## 2023-09-18 ENCOUNTER — Ambulatory Visit: Payer: Medicare Other | Admitting: Physical Medicine and Rehabilitation

## 2023-09-28 ENCOUNTER — Ambulatory Visit (INDEPENDENT_AMBULATORY_CARE_PROVIDER_SITE_OTHER): Payer: Medicare Other | Admitting: Physical Medicine and Rehabilitation

## 2023-09-28 ENCOUNTER — Encounter: Payer: Self-pay | Admitting: Physical Medicine and Rehabilitation

## 2023-09-28 VITALS — BP 152/86 | HR 78

## 2023-09-28 DIAGNOSIS — M47816 Spondylosis without myelopathy or radiculopathy, lumbar region: Secondary | ICD-10-CM

## 2023-09-28 DIAGNOSIS — M5441 Lumbago with sciatica, right side: Secondary | ICD-10-CM

## 2023-09-28 DIAGNOSIS — M48062 Spinal stenosis, lumbar region with neurogenic claudication: Secondary | ICD-10-CM

## 2023-09-28 DIAGNOSIS — M48061 Spinal stenosis, lumbar region without neurogenic claudication: Secondary | ICD-10-CM

## 2023-09-28 DIAGNOSIS — M5442 Lumbago with sciatica, left side: Secondary | ICD-10-CM

## 2023-09-28 DIAGNOSIS — G8929 Other chronic pain: Secondary | ICD-10-CM

## 2023-09-28 NOTE — Progress Notes (Unsigned)
 Linda Black - 53 y.o. female MRN [id number]  Date of birth: 04/14/1971  Office Visit Note: Visit Date: 09/28/2023 PCP: Jac Canavan, PA-C Referred by: Jac Canavan, PA-C  Subjective: Chief Complaint  Patient presents with   Lower Back - Pain   HPI: Linda Black is a 53 y.o. female who comes in today for evaluation of chronic, worsening and severe bilateral lower back pain radiating to buttocks and down both legs. Paresthesias noted to bilateral legs. Prior patient of Dr. Vira Browns. [place] interpreter at bedside. Pain ongoing for several years, worsens with prolonged standing and walking. She describes her pain as sore and burning sensation, currently rates as 8 out of 10. Some relief of pain with home exercise regimen, rest and use of medications. She does take Tylenol daily. Recent lumbar MRI imaging shows moderately severe spinal canal stenosis and bilateral lateral recess stenosis at L4-L5. She has undergone multiple lumbar epidural steroid injections in our office over the years that do provide some relief of pain. Patient denies focal weakness. No recent trauma or falls.      Review of Systems  Musculoskeletal:  Positive for back pain.  Neurological:  Positive for tingling. Negative for focal weakness and weakness.  All other systems reviewed and are negative.  Otherwise per HPI.  Assessment & Plan: Visit Diagnoses:    ICD-10-CM   1. Chronic bilateral low back pain with bilateral sciatica  M54.42 Ambulatory referral to Physical Medicine Rehab   M54.41    G89.29     2. Spinal stenosis of lumbar region with neurogenic claudication  M48.062 Ambulatory referral to Physical Medicine Rehab    3. Stenosis of lateral recess of lumbar spine  M48.061 Ambulatory referral to Physical Medicine Rehab    4. Facet arthropathy, lumbar  M47.816 Ambulatory referral to Physical Medicine Rehab       Plan: Findings:  Chronic, worsening and severe bilateral lower back pain  radiating to buttocks and down bilateral legs. Patient continues to have severe pain despite good conservative therapies such as home exercise regimen, rest and use of medications. Patients clinical presentation and exam are consistent with neurogenic claudication as a result of spinal canal stenosis. I discussed recent lumbar MRI imaging with her today using imaging and spine model. There is moderately severe multi factorial central stenosis at L4-L5. This central stenosis has progressed from prior MRI imaging in 2021. We discussed treatment options in detail, next step is to perform diagnostic and hopefully therapeutic bilateral transforaminal epidural steroid injection under fluoroscopic guidance. If good relief of pain with injection we can repeat this procedure infrequently as needed. Patient has no questions regarding injection procedure. I also discussed possibility of surgical consult with our spine surgeon Dr. Willia Craze. I will hold on this for now, but I wanted to make her aware this is also an option. We will see her back for injection. No red flag symptoms noted upon exam today.     Meds & Orders: No orders of the defined types were placed in this encounter.   Orders Placed This Encounter  Procedures   Ambulatory referral to Physical Medicine Rehab    Follow-up: Return for Bilateral L4 transforaminal epidural steroid injection.   Procedures: No procedures performed      Clinical History: Narrative & Impression CLINICAL DATA:  Chronic low back and bilateral lower extremity pain.   EXAM: MRI LUMBAR SPINE WITHOUT CONTRAST   TECHNIQUE: Multiplanar, multisequence MR imaging of the lumbar spine was  performed. No intravenous contrast was administered.   COMPARISON:  MRI lumbar spine 01/22/2020. Plain films lumbar spine 01/29/2020.   FINDINGS: Segmentation:  Standard.   Alignment:  Normal.   Vertebrae:  No fracture, evidence of discitis, or bone lesion.   Conus medullaris  and cauda equina: Conus extends to the L1 level. Conus and cauda equina appear normal.   Paraspinal and other soft tissues: Multiple bilateral renal cysts are unchanged.   Disc levels:   T10-11 and T11-12 are imaged in the sagittal plane only and negative.   T12-L1: Negative.   L1-2: Minimal disc bulge.  No stenosis.   L2-3: Minimal disc bulge without stenosis.   L3-4: There is a shallow disc bulge with some ligamentum flavum thickening and mild to moderate facet arthropathy. Mild to moderate central canal stenosis is present. The foramina are open. No change.   L4-5: Moderate to moderately severe bilateral facet degenerative change, broad-based disc bulge and ligamentum flavum thickening are seen. There is moderate to moderately severe central canal stenosis and narrowing of both lateral recesses, worse on the left. Mild to moderate bilateral foraminal narrowing is present. No change.   L5-S1: There is a shallow disc bulge with mild facet degenerative change and ligamentum flavum thickening. Previously seen annular fissure is no longer identified. No stenosis.   IMPRESSION: 1. No change in lumbar spondylosis which is most notable at L4-5 where there is moderate to moderately severe central canal stenosis and narrowing of both lateral recesses, worse on the left. Mild to moderate bilateral foraminal narrowing is also present at L4-5. 2. Mild to moderate central canal stenosis at L3-4.     Electronically Signed   By: Drusilla Kanner M.D.   On: 09/08/2023 08:55   She reports that she has never smoked. She has never used smokeless tobacco.  Recent Labs    07/31/23 0932  HGBA1C 5.7*    Objective:  VS:  HT:    WT:   BMI:     BP:(!) 152/86  HR:78bpm  TEMP: ( )  RESP:  Physical Exam Vitals and nursing note reviewed.  HENT:     Head: Normocephalic and atraumatic.     Right Ear: External ear normal.     Left Ear: External ear normal.     Nose: Nose normal.      Mouth/Throat:     Mouth: Mucous membranes are moist.  Eyes:     Extraocular Movements: Extraocular movements intact.  Cardiovascular:     Rate and Rhythm: Normal rate.     Pulses: Normal pulses.  Pulmonary:     Effort: Pulmonary effort is normal.  Abdominal:     General: Abdomen is flat. There is no distension.  Musculoskeletal:        General: Tenderness present.     Cervical back: Normal range of motion.     Comments: Patient rises from seated position to standing without difficulty. Good lumbar range of motion. No pain noted with facet loading. 5/5 strength noted with bilateral hip flexion, knee flexion/extension, ankle dorsiflexion/plantarflexion and EHL. No clonus noted bilaterally. No pain upon palpation of greater trochanters. No pain with internal/external rotation of bilateral hips. Sensation intact bilaterally. Negative slump test bilaterally. Ambulates without aid, gait steady.     Skin:    General: Skin is warm and dry.     Capillary Refill: Capillary refill takes less than 2 seconds.  Neurological:     General: No focal deficit present.     Mental Status:  She is alert and oriented to person, place, and time.  Psychiatric:        Mood and Affect: Mood normal.        Behavior: Behavior normal.     Ortho Exam  Imaging: No results found.  Past Medical/Family/Surgical/Social History: Medications & Allergies reviewed per EMR, new medications updated. Patient Active Problem List   Diagnosis Date Noted   Latent tuberculosis 07/23/2023   Sleep disturbance 03/19/2021   Primary osteoarthritis of right knee 02/11/2021   Status post total right knee replacement 02/11/2021   Chronic low back pain with sciatica 09/18/2020   Encounter for health maintenance examination in adult 01/09/2020   Radicular pain of right lower extremity 04/18/2019   Other intervertebral disc degeneration, lumbar region 04/18/2019   Radiculopathy of lumbosacral region 03/21/2019   Right leg pain  03/21/2019   Neuropathy involving both lower extremities 09/23/2018   Screen for colon cancer 05/17/2018   Vaccine counseling 05/17/2018   Fatty liver 05/17/2018   Screening for cervical cancer 05/17/2018   Hypothyroidism 05/17/2018   Need for pneumococcal vaccination 05/17/2018   Generalized abdominal tenderness 05/17/2018   Need for influenza vaccination 04/13/2018   Other fatigue 02/08/2018   Edema 01/25/2018   Renal failure (ARF), acute on chronic (HCC) 01/20/2018   Normocytic normochromic anemia 01/20/2018   Tremor 01/05/2018   Vitamin D deficiency 01/05/2018   RUQ abdominal pain 10/27/2017   Hemorrhoids 10/27/2017   High risk medication use 09/25/2017   Type 2 diabetes mellitus with neurological complications (HCC) 09/02/2017   Proteinuria 09/02/2017   Paresthesia 09/02/2017   IgA nephropathy 06/10/2017   Language barrier 06/10/2017   Gastroesophageal reflux disease 06/10/2017   SOB (shortness of breath) 02/24/2017   Hyperlipidemia 04/21/2016   Chronic right-sided low back pain with right-sided sciatica 01/29/2016   Morbid obesity (HCC) 01/29/2016   Hypokalemia 01/29/2016   Essential hypertension 01/29/2016   Past Medical History:  Diagnosis Date   Anxiety    Diabetes mellitus without complication (HCC)    Dr. Lonzo Cloud - diet controlled type 2   Fatty liver 2019   elevated LFTs, negative for Hep B and Hep C 04/2018   GERD (gastroesophageal reflux disease)    Heart murmur    Hepatitis    Hyperlipidemia    Hypertension    Hypothyroidism 2019   IgA nephropathy 2019   per biopsy, Dr. Annie Sable; Stage 4 ckd   Language barrier    Lumbar stenosis    Dr. Otelia Sergeant   Obesity    Osteoarthritis    Proteinuria    Family History  Problem Relation Age of Onset   Diabetes Mother    Hypertension Father    Colon cancer Neg Hx    Stomach cancer Neg Hx    Esophageal cancer Neg Hx    Rectal cancer Neg Hx    Liver cancer Neg Hx    Past Surgical History:   Procedure Laterality Date   AV FISTULA PLACEMENT Left 10/13/2022   Procedure: LEFT ARM STAGE 1 BRACHIOBASILIC FISTULA CREATION;  Surgeon: [name], MD;  Location: [facility] OR;  Service: Vascular;  Laterality: Left;   BASCILIC VEIN TRANSPOSITION Left 12/01/2022   Procedure: LEFT ARM SECOND STAGE BASILIC VEIN TRANSPOSITION;  Surgeon: [name], MD;  Location: [facility] OR;  Service: Vascular;  Laterality: Left;   RENAL BIOPSY  2018   IgA nephropathy, Dr. Annie Sable   TOTAL KNEE ARTHROPLASTY Right 02/11/2021   Procedure: RIGHT TOTAL KNEE ARTHROPLASTY;  Surgeon:  Tarry Kos, MD;  Location: [facility] OR;  Service: Orthopedics;  Laterality: Right;   Social History   Occupational History   Occupation: homemaker  Tobacco Use   Smoking status: Never   Smokeless tobacco: Never  Vaping Use   Vaping status: Never Used  Substance and Sexual Activity   Alcohol use: Never   Drug use: Never   Sexual activity: Not on file

## 2023-09-28 NOTE — Progress Notes (Unsigned)
 Pain Score:   8

## 2023-10-02 ENCOUNTER — Other Ambulatory Visit: Payer: Self-pay | Admitting: Medical

## 2023-10-07 ENCOUNTER — Ambulatory Visit (INDEPENDENT_AMBULATORY_CARE_PROVIDER_SITE_OTHER): Payer: Medicare Other | Admitting: Physical Medicine and Rehabilitation

## 2023-10-07 ENCOUNTER — Other Ambulatory Visit: Payer: Self-pay

## 2023-10-07 VITALS — BP 124/76 | HR 82

## 2023-10-07 DIAGNOSIS — M48062 Spinal stenosis, lumbar region with neurogenic claudication: Secondary | ICD-10-CM

## 2023-10-07 DIAGNOSIS — M5416 Radiculopathy, lumbar region: Secondary | ICD-10-CM | POA: Diagnosis not present

## 2023-10-07 MED ORDER — METHYLPREDNISOLONE ACETATE 40 MG/ML IJ SUSP
40.0000 mg | Freq: Once | INTRAMUSCULAR | Status: AC
  Administered 2023-10-07: 40 mg

## 2023-10-07 NOTE — Patient Instructions (Signed)

## 2023-10-07 NOTE — Progress Notes (Signed)
 Pain Scale   Average Pain 8        No BT,  No Dye Allergies.

## 2023-10-07 NOTE — Progress Notes (Signed)
 [name] - 53 y.o. female MRN [id number]  Date of birth: 1971/04/19  Office Visit Note: Visit Date: 10/07/2023 PCP: Linda Canavan, PA-C Referred by: Linda Canavan, PA-C  Subjective: Chief Complaint  Patient presents with   Lower Back - Pain   HPI:  Linda Black is a 53 y.o. female who comes in today at the request of Ellin Goodie, FNP for planned Bilateral L4-5 Lumbar Transforaminal epidural steroid injection with fluoroscopic guidance.  The patient has failed conservative care including home exercise, medications, time and activity modification.  This injection will be diagnostic and hopefully therapeutic.  Please see requesting physician notes for further details and justification.   ROS Otherwise per HPI.  Assessment & Plan: Visit Diagnoses:    ICD-10-CM   1. Lumbar radiculopathy  M54.16 XR C-ARM NO REPORT    Epidural Steroid injection    methylPREDNISolone acetate (DEPO-MEDROL) injection 40 mg    2. Spinal stenosis of lumbar region with neurogenic claudication  M48.062 XR C-ARM NO REPORT    Epidural Steroid injection    methylPREDNISolone acetate (DEPO-MEDROL) injection 40 mg      Plan: No additional findings.   Meds & Orders:  Meds ordered this encounter  Medications   methylPREDNISolone acetate (DEPO-MEDROL) injection 40 mg    Orders Placed This Encounter  Procedures   XR C-ARM NO REPORT   Epidural Steroid injection    Follow-up: Return if symptoms worsen or fail to improve.   Procedures: No procedures performed  Lumbosacral Transforaminal Epidural Steroid Injection - Sub-Pedicular Approach with Fluoroscopic Guidance  Patient: Linda Black      Date of Birth: 05-10-1971 MRN: [id number] PCP: Linda Canavan, PA-C      Visit Date: 10/07/2023   Universal Protocol:    Date/Time: 10/07/2023  Consent Given By: the patient  Position: PRONE  Additional Comments: Vital signs were monitored before and after the procedure. Patient was prepped and  draped in the usual sterile fashion. The correct patient, procedure, and site was verified.   Injection Procedure Details:   Procedure diagnoses: Lumbar radiculopathy [M54.16]    Meds Administered:  Meds ordered this encounter  Medications   methylPREDNISolone acetate (DEPO-MEDROL) injection 40 mg    Laterality: Bilateral  Location/Site: L4  Needle:5.0 in., 22 ga.  Short bevel or Quincke spinal needle  Needle Placement: Transforaminal  Findings:    -Comments: Excellent flow of contrast along the nerve, nerve root and into the epidural space.  Procedure Details: After squaring off the end-plates to get a true AP view, the C-arm was positioned so that an oblique view of the foramen as noted above was visualized. The target area is just inferior to the "nose of the scotty dog" or sub pedicular. The soft tissues overlying this structure were infiltrated with 2-3 ml. of 1% Lidocaine without Epinephrine.  The spinal needle was inserted toward the target using a "trajectory" view along the fluoroscope beam.  Under AP and lateral visualization, the needle was advanced so it did not puncture dura and was located close the 6 O'Clock position of the pedical in AP tracterory. Biplanar projections were used to confirm position. Aspiration was confirmed to be negative for CSF and/or blood. A 1-2 ml. volume of Isovue-250 was injected and flow of contrast was noted at each level. Radiographs were obtained for documentation purposes.   After attaining the desired flow of contrast documented above, a 0.5 to 1.0 ml test dose of 0.25% Marcaine was injected into each  respective transforaminal space.  The patient was observed for 90 seconds post injection.  After no sensory deficits were reported, and normal lower extremity motor function was noted,   the above injectate was administered so that equal amounts of the injectate were placed at each foramen (level) into the transforaminal epidural  space.   Additional Comments:  The patient tolerated the procedure well Dressing: 2 x 2 sterile gauze and Band-Aid    Post-procedure details: Patient was observed during the procedure. Post-procedure instructions were reviewed.  Patient left the clinic in stable condition.    Clinical History: Narrative & Impression CLINICAL DATA:  Chronic low back and bilateral lower extremity pain.   EXAM: MRI LUMBAR SPINE WITHOUT CONTRAST   TECHNIQUE: Multiplanar, multisequence MR imaging of the lumbar spine was performed. No intravenous contrast was administered.   COMPARISON:  MRI lumbar spine 01/22/2020. Plain films lumbar spine 01/29/2020.   FINDINGS: Segmentation:  Standard.   Alignment:  Normal.   Vertebrae:  No fracture, evidence of discitis, or bone lesion.   Conus medullaris and cauda equina: Conus extends to the L1 level. Conus and cauda equina appear normal.   Paraspinal and other soft tissues: Multiple bilateral renal cysts are unchanged.   Disc levels:   T10-11 and T11-12 are imaged in the sagittal plane only and negative.   T12-L1: Negative.   L1-2: Minimal disc bulge.  No stenosis.   L2-3: Minimal disc bulge without stenosis.   L3-4: There is a shallow disc bulge with some ligamentum flavum thickening and mild to moderate facet arthropathy. Mild to moderate central canal stenosis is present. The foramina are open. No change.   L4-5: Moderate to moderately severe bilateral facet degenerative change, broad-based disc bulge and ligamentum flavum thickening are seen. There is moderate to moderately severe central canal stenosis and narrowing of both lateral recesses, worse on the left. Mild to moderate bilateral foraminal narrowing is present. No change.   L5-S1: There is a shallow disc bulge with mild facet degenerative change and ligamentum flavum thickening. Previously seen annular fissure is no longer identified. No stenosis.   IMPRESSION: 1. No  change in lumbar spondylosis which is most notable at L4-5 where there is moderate to moderately severe central canal stenosis and narrowing of both lateral recesses, worse on the left. Mild to moderate bilateral foraminal narrowing is also present at L4-5. 2. Mild to moderate central canal stenosis at L3-4.     Electronically Signed   By: Drusilla Kanner M.D.   On: 09/08/2023 08:55     Objective:  VS:  HT:    WT:   BMI:     BP:124/76  HR:82bpm  TEMP: ( )  RESP:  Physical Exam Vitals and nursing note reviewed.  Constitutional:      General: She is not in acute distress.    Appearance: Normal appearance. She is obese. She is not ill-appearing.  HENT:     Head: Normocephalic and atraumatic.     Right Ear: External ear normal.     Left Ear: External ear normal.  Eyes:     Extraocular Movements: Extraocular movements intact.  Cardiovascular:     Rate and Rhythm: Normal rate.     Pulses: Normal pulses.  Pulmonary:     Effort: Pulmonary effort is normal. No respiratory distress.  Abdominal:     General: There is no distension.     Palpations: Abdomen is soft.  Musculoskeletal:        General: Tenderness present.  Cervical back: Neck supple.     Right lower leg: No edema.     Left lower leg: No edema.     Comments: Patient has good distal strength with no pain over the greater trochanters.  No clonus or focal weakness.  Skin:    Findings: No erythema, lesion or rash.  Neurological:     General: No focal deficit present.     Mental Status: She is alert and oriented to person, place, and time.     Sensory: No sensory deficit.     Motor: No weakness or abnormal muscle tone.     Coordination: Coordination normal.  Psychiatric:        Mood and Affect: Mood normal.        Behavior: Behavior normal.      Imaging: No results found.

## 2023-10-07 NOTE — Procedures (Signed)
 Lumbosacral Transforaminal Epidural Steroid Injection - Sub-Pedicular Approach with Fluoroscopic Guidance  Patient: Linda Black      Date of Birth: 07-13-1971 MRN: [id number] PCP: Jac Canavan, PA-C      Visit Date: 10/07/2023   Universal Protocol:    Date/Time: 10/07/2023  Consent Given By: the patient  Position: PRONE  Additional Comments: Vital signs were monitored before and after the procedure. Patient was prepped and draped in the usual sterile fashion. The correct patient, procedure, and site was verified.   Injection Procedure Details:   Procedure diagnoses: Lumbar radiculopathy [M54.16]    Meds Administered:  Meds ordered this encounter  Medications   methylPREDNISolone acetate (DEPO-MEDROL) injection 40 mg    Laterality: Bilateral  Location/Site: L4  Needle:5.0 in., 22 ga.  Short bevel or Quincke spinal needle  Needle Placement: Transforaminal  Findings:    -Comments: Excellent flow of contrast along the nerve, nerve root and into the epidural space.  Procedure Details: After squaring off the end-plates to get a true AP view, the C-arm was positioned so that an oblique view of the foramen as noted above was visualized. The target area is just inferior to the "nose of the scotty dog" or sub pedicular. The soft tissues overlying this structure were infiltrated with 2-3 ml. of 1% Lidocaine without Epinephrine.  The spinal needle was inserted toward the target using a "trajectory" view along the fluoroscope beam.  Under AP and lateral visualization, the needle was advanced so it did not puncture dura and was located close the 6 O'Clock position of the pedical in AP tracterory. Biplanar projections were used to confirm position. Aspiration was confirmed to be negative for CSF and/or blood. A 1-2 ml. volume of Isovue-250 was injected and flow of contrast was noted at each level. Radiographs were obtained for documentation purposes.   After attaining the desired  flow of contrast documented above, a 0.5 to 1.0 ml test dose of 0.25% Marcaine was injected into each respective transforaminal space.  The patient was observed for 90 seconds post injection.  After no sensory deficits were reported, and normal lower extremity motor function was noted,   the above injectate was administered so that equal amounts of the injectate were placed at each foramen (level) into the transforaminal epidural space.   Additional Comments:  The patient tolerated the procedure well Dressing: 2 x 2 sterile gauze and Band-Aid    Post-procedure details: Patient was observed during the procedure. Post-procedure instructions were reviewed.  Patient left the clinic in stable condition.

## 2023-11-30 ENCOUNTER — Ambulatory Visit (INDEPENDENT_AMBULATORY_CARE_PROVIDER_SITE_OTHER): Payer: Medicaid Other | Admitting: Medical

## 2023-11-30 VITALS — BP 120/80 | HR 68 | Wt 192.6 lb

## 2023-11-30 DIAGNOSIS — Z758 Other problems related to medical facilities and other health care: Secondary | ICD-10-CM

## 2023-11-30 DIAGNOSIS — E039 Hypothyroidism, unspecified: Secondary | ICD-10-CM | POA: Diagnosis not present

## 2023-11-30 DIAGNOSIS — N186 End stage renal disease: Secondary | ICD-10-CM | POA: Insufficient documentation

## 2023-11-30 DIAGNOSIS — E1149 Type 2 diabetes mellitus with other diabetic neurological complication: Secondary | ICD-10-CM

## 2023-11-30 DIAGNOSIS — I10 Essential (primary) hypertension: Secondary | ICD-10-CM

## 2023-11-30 DIAGNOSIS — E1140 Type 2 diabetes mellitus with diabetic neuropathy, unspecified: Secondary | ICD-10-CM | POA: Insufficient documentation

## 2023-11-30 DIAGNOSIS — E785 Hyperlipidemia, unspecified: Secondary | ICD-10-CM | POA: Diagnosis not present

## 2023-11-30 DIAGNOSIS — Z2820 Immunization not carried out because of patient decision for unspecified reason: Secondary | ICD-10-CM | POA: Insufficient documentation

## 2023-11-30 DIAGNOSIS — E1349 Other specified diabetes mellitus with other diabetic neurological complication: Secondary | ICD-10-CM

## 2023-11-30 DIAGNOSIS — Z603 Acculturation difficulty: Secondary | ICD-10-CM

## 2023-11-30 DIAGNOSIS — Z992 Dependence on renal dialysis: Secondary | ICD-10-CM

## 2023-11-30 LAB — POCT GLYCOSYLATED HEMOGLOBIN (HGB A1C): Hemoglobin A1C: 4.5 % (ref 4.0–5.6)

## 2023-11-30 MED ORDER — CALCITRIOL 0.25 MCG PO CAPS: 0.2500 ug | ORAL_CAPSULE | Freq: Every day | ORAL | 3 refills | Status: AC

## 2023-11-30 MED ORDER — GABAPENTIN 100 MG PO CAPS: ORAL_CAPSULE | ORAL | 1 refills | Status: DC

## 2023-11-30 MED ORDER — FUROSEMIDE 40 MG PO TABS: 40.0000 mg | ORAL_TABLET | Freq: Two times a day (BID) | ORAL | 3 refills | Status: AC

## 2023-11-30 MED ORDER — POTASSIUM CHLORIDE ER 10 MEQ PO TBCR: 10.0000 meq | EXTENDED_RELEASE_TABLET | Freq: Two times a day (BID) | ORAL | 3 refills | Status: AC

## 2023-11-30 MED ORDER — ROSUVASTATIN CALCIUM 40 MG PO TABS: 40.0000 mg | ORAL_TABLET | Freq: Every day | ORAL | 3 refills | Status: AC

## 2023-11-30 MED ORDER — LEVOTHYROXINE SODIUM 25 MCG PO TABS: ORAL_TABLET | ORAL | 3 refills | Status: AC

## 2023-11-30 MED ORDER — AMLODIPINE BESYLATE 10 MG PO TABS: 10.0000 mg | ORAL_TABLET | Freq: Every day | ORAL | 3 refills | Status: AC

## 2023-11-30 NOTE — Progress Notes (Signed)
 Subjective:  Linda Black is a 53 y.o. female who presents for Chief Complaint  Patient presents with   Follow-up    Med mgmt. 4 month follow up.      Here with interpreter [name]  Overall doing well.  Here for med check.  She does have her pill bottles with her today.  She started dialysis back in September.  She goes Tuesday, Thursday, Saturday.  Things seem to be going okay at the moment  She is compliant with her medications.  She declines shingles vaccine  She has not seen her eye doctor in quite a while.  She still complains of ongoing neuropathy issues with her feet, gets numbness and burning sensations in her feet.  She had tried gabapentin  several years ago but does not recall if it helped or not.  No other aggravating or relieving factors.    No other c/o.  Past Medical History:  Diagnosis Date   Anxiety    Diabetes mellitus without complication (HCC)    Dr. [name] - diet controlled type 2   Fatty liver 2019   elevated LFTs, negative for Hep B and Hep C 04/2018   GERD (gastroesophageal reflux disease)    Heart murmur    Hepatitis    Hyperlipidemia    Hypertension    Hypothyroidism 2019   IgA nephropathy 2019   per biopsy, Dr. [name]; Stage 4 ckd   Language barrier    Lumbar stenosis    Dr. [name]   Obesity    Osteoarthritis    Proteinuria    Current Outpatient Medications on File Prior to Visit  Medication Sig Dispense Refill   acetaminophen  (TYLENOL ) 500 MG tablet Take 1,000 mg by mouth daily as needed for moderate pain.     losartan  (COZAAR ) 100 MG tablet TAKE 1 TABLET(100 MG) BY MOUTH DAILY 90 tablet 1   metoprolol  succinate (TOPROL -XL) 25 MG 24 hr tablet TAKE 1 TABLET(25 MG) BY MOUTH DAILY 90 tablet 1   sevelamer carbonate (RENVELA) 800 MG tablet Take 800 mg by mouth 3 (three) times daily with meals.     Accu-Chek Softclix Lancets lancets Use as instructed (Patient not taking: Reported on 11/30/2023) 100 each 12   glucose blood  (ACCU-CHEK AVIVA PLUS) test strip Test daily (Patient not taking: Reported on 07/22/2023) 100 strip 3   No current facility-administered medications on file prior to visit.    The following portions of the patient's history were reviewed and updated as appropriate: allergies, current medications, past family history, past medical history, past social history, past surgical history and problem list.  ROS Otherwise as in subjective above     Objective: BP 120/80   Pulse 68   Wt 192 lb 9.6 oz (87.4 kg)   LMP 03/23/2017   BMI 37.61 kg/m   Wt Readings from Last 3 Encounters:  11/30/23 192 lb 9.6 oz (87.4 kg)  07/31/23 188 lb (85.3 kg)  07/22/23 190 lb (86.2 kg)   General appearance: alert, no distress, well developed, well nourished Neck: supple, no lymphadenopathy, no thyromegaly, no masses Heart: RRR, normal S1, S2, no murmurs Lungs: CTA bilaterally, no wheezes, rhonchi, or rales Pulses: 2+ radial pulses, 2+ pedal pulses, normal cap refill Ext: no edema  Diabetic Foot Exam - Simple   Simple Foot Form Diabetic Foot exam was performed with the following findings: Yes 11/30/2023 10:41 AM  Visual Inspection See comments: Yes Sensation Testing See comments: Yes Pulse Check Posterior Tibialis and Dorsalis pulse  intact bilaterally: Yes Comments Flatfeet, decree sensation throughout with monofilament exam, no other lesions       Assessment: Encounter Diagnoses  Name Primary?   Chronic kidney disease requiring chronic dialysis (HCC) Yes   Hypothyroidism, unspecified type    Hyperlipidemia, unspecified hyperlipidemia type    Type 2 diabetes mellitus with neurological complications [facility])    Language barrier    Essential hypertension    Vaccine refused by patient    Other diabetic neurological complication associated with other specified diabetes mellitus (HCC)       Plan: Diabetes-controlled, hemoglobin A1c today in normal range  Dialysis patient-started in  September 2024.  Seems to be doing okay with things at the moment.  Hypertension-continue losartan  100 mg daily, Toprol -XL 25 mg daily, amlodipine  10 mg daily  Hyperlipidemia-continue rosuvastatin  Crestor  40 mg daily  Hypothyroidism - continue levothyroxine  25 mcg daily  Refuses shingrix vaccine  See eye doctor soon  [name] was seen today for follow-up.  Diagnoses and all orders for this visit:  Chronic kidney disease requiring chronic dialysis (HCC)  Hypothyroidism, unspecified type  Hyperlipidemia, unspecified hyperlipidemia type  Type 2 diabetes mellitus with neurological complications (HCC)  Language barrier  Essential hypertension  Vaccine refused by patient  Other diabetic neurological complication associated with other specified diabetes mellitus (HCC)  Other orders -     gabapentin  (NEURONTIN ) 100 MG capsule; 3 days per week QHS after dialysis -     calcitRIOL  (ROCALTROL ) 0.25 MCG capsule; Take 1 capsule (0.25 mcg total) by mouth daily. -     rosuvastatin  (CRESTOR ) 40 MG tablet; Take 1 tablet (40 mg total) by mouth daily. -     levothyroxine  (SYNTHROID ) 25 MCG tablet; TAKE 1 TABLET(25 MCG) BY MOUTH DAILY BEFORE BREAKFAST -     furosemide  (LASIX ) 40 MG tablet; Take 1 tablet (40 mg total) by mouth 2 (two) times daily. -     amLODipine  (NORVASC ) 10 MG tablet; Take 1 tablet (10 mg total) by mouth daily. -     potassium chloride  (KLOR-CON ) 10 MEQ tablet; Take 1 tablet (10 mEq total) by mouth 2 (two) times daily.     Follow up: 46mo

## 2023-11-30 NOTE — Patient Instructions (Addendum)
 Go ahead and schedule your yearly eye exam for diabetes eye exam  Get your Shingrix vaccine at your pharmacy.   Begin Gabapentin  100mg  3 days per week at bedtime on dialysis days, Tuesday, Thursday, Saturday  Ask kidney center about possibility of transplant consideration

## 2023-11-30 NOTE — Addendum Note (Signed)
 Addended by: Claudene Crystal on: 11/30/2023 10:46 AM   Modules accepted: Orders

## 2024-01-26 ENCOUNTER — Ambulatory Visit (HOSPITAL_COMMUNITY)
Admission: EM | Admit: 2024-01-26 | Discharge: 2024-01-26 | Disposition: A | Discharge status: Home / Self Care | Attending: Emergency Medicine | Admitting: Emergency Medicine

## 2024-01-26 ENCOUNTER — Encounter (HOSPITAL_COMMUNITY): Payer: Self-pay

## 2024-01-26 DIAGNOSIS — H65191 Other acute nonsuppurative otitis media, right ear: Secondary | ICD-10-CM | POA: Diagnosis not present

## 2024-01-26 DIAGNOSIS — Z992 Dependence on renal dialysis: Secondary | ICD-10-CM | POA: Diagnosis not present

## 2024-01-26 MED ORDER — AMOXICILLIN-POT CLAVULANATE 500-125 MG PO TABS: 1.0000 | ORAL_TABLET | Freq: Every day | ORAL | 0 refills | Status: AC

## 2024-01-26 NOTE — ED Provider Notes (Signed)
 [facility]    CSN: [id number] Arrival date & time: 01/26/24  9195      History   Chief Complaint Chief Complaint  Patient presents with   Otalgia    HPI Linda Black is a 53 y.o. female.  Daughter translates per patient request  Here with 4 day history of right ear pain 8/10 pain, fullness, and muffled hearing  Headache right sided intermittently  Has used tylenol  that helped a little  No fevers, congestion, cough  CKD awaiting transplant  Past Medical History:  Diagnosis Date   Anxiety    Diabetes mellitus without complication (HCC)    Dr. Sam - diet controlled type 2   Fatty liver 2019   elevated LFTs, negative for Hep B and Hep C 04/2018   GERD (gastroesophageal reflux disease)    Heart murmur    Hepatitis    Hyperlipidemia    Hypertension    Hypothyroidism 2019   IgA nephropathy 2019   per biopsy, Dr. Curtis Heman; Stage 4 ckd   Language barrier    Lumbar stenosis    Dr. Lucilla   Obesity    Osteoarthritis    Proteinuria     Patient Active Problem List   Diagnosis Date Noted   Chronic kidney disease requiring chronic dialysis (HCC) 11/30/2023   Vaccine refused by patient 11/30/2023   Diabetic neuropathy (HCC) 11/30/2023   Latent tuberculosis 07/23/2023   Primary osteoarthritis of right knee 02/11/2021   Status post total right knee replacement 02/11/2021   Chronic low back pain with sciatica 09/18/2020   Encounter for health maintenance examination in adult 01/09/2020   Radicular pain of right lower extremity 04/18/2019   Other intervertebral disc degeneration, lumbar region 04/18/2019   Radiculopathy of lumbosacral region 03/21/2019   Neuropathy involving both lower extremities 09/23/2018   Screen for colon cancer 05/17/2018   Vaccine counseling 05/17/2018   Fatty liver 05/17/2018   Screening for cervical cancer 05/17/2018   Hypothyroidism 05/17/2018   Need for pneumococcal vaccination 05/17/2018   Generalized  abdominal tenderness 05/17/2018   Need for influenza vaccination 04/13/2018   Other fatigue 02/08/2018   Edema 01/25/2018   Normocytic normochromic anemia 01/20/2018   Tremor 01/05/2018   Vitamin D  deficiency 01/05/2018   Hemorrhoids 10/27/2017   High risk medication use 09/25/2017   Type 2 diabetes mellitus with neurological complications (HCC) 09/02/2017   Paresthesia 09/02/2017   IgA nephropathy 06/10/2017   Language barrier 06/10/2017   Gastroesophageal reflux disease 06/10/2017   Hyperlipidemia 04/21/2016   Chronic right-sided low back pain with right-sided sciatica 01/29/2016   Morbid obesity (HCC) 01/29/2016   Hypokalemia 01/29/2016   Essential hypertension 01/29/2016    Past Surgical History:  Procedure Laterality Date   AV FISTULA PLACEMENT Left 10/13/2022   Procedure: LEFT ARM STAGE 1 BRACHIOBASILIC FISTULA CREATION;  Surgeon: Magda Debby SAILOR, MD;  Location: [facility];  Service: Vascular;  Laterality: Left;   BASCILIC VEIN TRANSPOSITION Left 12/01/2022   Procedure: LEFT ARM SECOND STAGE BASILIC VEIN TRANSPOSITION;  Surgeon: Magda Debby SAILOR, MD;  Location: [facility];  Service: Vascular;  Laterality: Left;   RENAL BIOPSY  2018   IgA nephropathy, Dr. Curtis Heman   TOTAL KNEE ARTHROPLASTY Right 02/11/2021   Procedure: RIGHT TOTAL KNEE ARTHROPLASTY;  Surgeon: Jerri Kay HERO, MD;  Location: [facility];  Service: Orthopedics;  Laterality: Right;    OB History   No obstetric history on file.      Home Medications  Prior to Admission medications   Medication Sig Start Date End Date Taking? Authorizing Provider  amoxicillin -clavulanate (AUGMENTIN) 500-125 MG tablet Take 1 tablet by mouth daily for 5 days. On dialysis days, take your dose AFTER dialysis session. 01/26/24 01/31/24 Yes Mel Tadros, Asberry, PA-C  Accu-Chek Softclix Lancets lancets Use as instructed Patient not taking: Reported on 11/30/2023 02/19/22   Tysinger, Alm RAMAN, PA-C  acetaminophen  (TYLENOL ) 500 MG tablet Take  1,000 mg by mouth daily as needed for moderate pain.    [provider]  amLODipine  (NORVASC ) 10 MG tablet Take 1 tablet (10 mg total) by mouth daily. 11/30/23   Tysinger, Alm RAMAN, PA-C  calcitRIOL  (ROCALTROL ) 0.25 MCG capsule Take 1 capsule (0.25 mcg total) by mouth daily. 11/30/23   Tysinger, Alm RAMAN, PA-C  furosemide  (LASIX ) 40 MG tablet Take 1 tablet (40 mg total) by mouth 2 (two) times daily. 11/30/23   Tysinger, Alm RAMAN, PA-C  gabapentin  (NEURONTIN ) 100 MG capsule 3 days per week QHS after dialysis 11/30/23   Bulah Alm RAMAN, PA-C  glucose blood (ACCU-CHEK AVIVA PLUS) test strip Test daily Patient not taking: Reported on 07/22/2023 02/19/22   Tysinger, Alm RAMAN, PA-C  levothyroxine  (SYNTHROID ) 25 MCG tablet TAKE 1 TABLET(25 MCG) BY MOUTH DAILY BEFORE BREAKFAST 11/30/23   Tysinger, Alm RAMAN, PA-C  losartan  (COZAAR ) 100 MG tablet TAKE 1 TABLET(100 MG) BY MOUTH DAILY 08/10/23   Tysinger, Alm RAMAN, PA-C  metoprolol  succinate (TOPROL -XL) 25 MG 24 hr tablet TAKE 1 TABLET(25 MG) BY MOUTH DAILY 10/02/23   Tysinger, Alm RAMAN, PA-C  potassium chloride  (KLOR-CON ) 10 MEQ tablet Take 1 tablet (10 mEq total) by mouth 2 (two) times daily. 11/30/23   Tysinger, Alm RAMAN, PA-C  rosuvastatin  (CRESTOR ) 40 MG tablet Take 1 tablet (40 mg total) by mouth daily. 11/30/23   Tysinger, Alm RAMAN, PA-C  sevelamer carbonate (RENVELA) 800 MG tablet Take 800 mg by mouth 3 (three) times daily with meals.    [provider]    Family History Family History  Problem Relation Age of Onset   Diabetes Mother    Hypertension Father    Colon cancer Neg Hx    Stomach cancer Neg Hx    Esophageal cancer Neg Hx    Rectal cancer Neg Hx    Liver cancer Neg Hx     Social History Social History   Tobacco Use   Smoking status: Never   Smokeless tobacco: Never  Vaping Use   Vaping status: Never Used  Substance Use Topics   Alcohol use: Never   Drug use: Never     Allergies   Lisinopril , Covid-19 (mrna) vaccine, and  Other   Review of Systems Review of Systems  HENT:  Positive for ear pain.    As per HPI  Physical Exam Triage Vital Signs ED Triage Vitals [01/26/24 0821]  Encounter Vitals Group     BP (!) 149/84     Girls Systolic BP Percentile      Girls Diastolic BP Percentile      Boys Systolic BP Percentile      Boys Diastolic BP Percentile      Pulse Rate 74     Resp 18     Temp 98.1 F (36.7 C)     Temp Source Oral     SpO2 96 %     Weight      Height      Head Circumference      Peak Flow  Pain Score 8     Pain Loc      Pain Education      Exclude from Growth Chart    No data found.  Updated Vital Signs BP (!) 149/84 (BP Location: Right Arm)   Pulse 74   Temp 98.1 F (36.7 C) (Oral)   Resp 18   LMP 03/23/2017   SpO2 96%   Visual Acuity Right Eye Distance:   Left Eye Distance:   Bilateral Distance:    Right Eye Near:   Left Eye Near:    Bilateral Near:     Physical Exam Vitals and nursing note reviewed.  Constitutional:      General: She is not in acute distress. HENT:     Right Ear: Tympanic membrane is injected and erythematous.     Left Ear: Tympanic membrane and ear canal normal.     Nose: Nose normal.     Mouth/Throat:     Mouth: Mucous membranes are moist.     Pharynx: Oropharynx is clear.   Eyes:     Extraocular Movements: Extraocular movements intact.     Conjunctiva/sclera: Conjunctivae normal.     Pupils: Pupils are equal, round, and reactive to light.    Cardiovascular:     Rate and Rhythm: Normal rate and regular rhythm.     Heart sounds: Normal heart sounds.  Pulmonary:     Effort: Pulmonary effort is normal.     Breath sounds: Normal breath sounds.  Abdominal:     General: Abdomen is flat.     Palpations: Abdomen is soft.     Tenderness: There is no abdominal tenderness.  Lymphadenopathy:     Cervical: No cervical adenopathy.   Neurological:     Mental Status: She is alert and oriented to person, place, and time.       UC Treatments / Results  Labs (all labs ordered are listed, but only abnormal results are displayed) Labs Reviewed - No data to display  EKG   Radiology No results found.  Procedures Procedures (including critical care time)  Medications Ordered in UC Medications - No data to display  Initial Impression / Assessment and Plan / UC Course  I have reviewed the triage vital signs and the nursing notes.  Pertinent labs & imaging results that were available during my care of the patient were reviewed by me and considered in my medical decision making (see chart for details).  Right otitis media Unilateral non severe. 5 day dosing appropriate Augmentin with renal dosing - per uptodate guidelines; 500-125 mg one tablet every 24 hours, given after dialysis on dialysis days. Discussed dosing with patient and daughter Return precautions, follow with primary care No questions  Final Clinical Impressions(s) / UC Diagnoses   Final diagnoses:  Other non-recurrent acute nonsuppurative otitis media of right ear  Dialysis patient [facility])     Discharge Instructions      Augmentin once daily for 5 days in a row. Please take the augmentin tablet AFTER completing dialysis on the days you have dialysis sessions. Continue tylenol  for pain     ED Prescriptions     Medication Sig Dispense Auth. Provider   amoxicillin -clavulanate (AUGMENTIN) 500-125 MG tablet Take 1 tablet by mouth daily for 5 days. On dialysis days, take your dose AFTER dialysis session. 5 tablet Yohanna Tow, Asberry, PA-C      PDMP not reviewed this encounter.   Jeryl Asberry, PA-C 01/26/24 [id number]

## 2024-01-26 NOTE — ED Triage Notes (Signed)
 Pt c/o rt ear pain x4 days. Took tylenol  with some relief. States can't hear good out of rt ear.

## 2024-01-26 NOTE — Discharge Instructions (Signed)
 Augmentin once daily for 5 days in a row. Please take the augmentin tablet AFTER completing dialysis on the days you have dialysis sessions. Continue tylenol  for pain

## 2024-02-04 ENCOUNTER — Ambulatory Visit: Admitting: Medical

## 2024-02-04 VITALS — BP 138/82 | HR 72 | Temp 98.2°F | Wt 183.6 lb

## 2024-02-04 DIAGNOSIS — H938X1 Other specified disorders of right ear: Secondary | ICD-10-CM | POA: Diagnosis not present

## 2024-02-04 DIAGNOSIS — H6690 Otitis media, unspecified, unspecified ear: Secondary | ICD-10-CM | POA: Diagnosis not present

## 2024-02-04 DIAGNOSIS — H9201 Otalgia, right ear: Secondary | ICD-10-CM | POA: Diagnosis not present

## 2024-02-04 MED ORDER — CETIRIZINE HCL 5 MG PO TABS: 5.0000 mg | ORAL_TABLET | Freq: Every day | ORAL | 0 refills | Status: DC

## 2024-02-04 MED ORDER — FLUTICASONE PROPIONATE 50 MCG/ACT NA SUSP: 2.0000 | Freq: Every day | NASAL | 0 refills | Status: AC

## 2024-02-04 MED ORDER — AMOXICILLIN-POT CLAVULANATE 500-125 MG PO TABS: 1.0000 | ORAL_TABLET | Freq: Every day | ORAL | 0 refills | Status: DC

## 2024-02-04 NOTE — Progress Notes (Signed)
 Subjective:  Linda Black is a 53 y.o. female who presents for Chief Complaint  Patient presents with   Acute Visit    Re-check ear as she is having issues hearing out of it. Pain level hurts alittle     Here today for recheck on ear.  She is here with her 99 year old granddaughter who translates.  She went to urgent care a few days ago for similar.  Was prescribed Augmentin  antibiotic.  She used 5 days of this but still no better.  She has right ear pain but not a lot of other symptoms.  She had a little bit of nausea but no cough, no nasal congestion, subjective fever +.  No dizziness.  No ear drainage.  No mouth pain or other facial pain.  No other aggravating or relieving factors.    No other c/o.  Past Medical History:  Diagnosis Date   Anxiety    Diabetes mellitus without complication (HCC)    Dr. Sam - diet controlled type 2   Fatty liver 2019   elevated LFTs, negative for Hep B and Hep C 04/2018   GERD (gastroesophageal reflux disease)    Heart murmur    Hepatitis    Hyperlipidemia    Hypertension    Hypothyroidism 2019   IgA nephropathy 2019   per biopsy, Dr. Curtis Heman; Stage 4 ckd   Language barrier    Lumbar stenosis    Dr. Lucilla   Obesity    Osteoarthritis    Proteinuria    Current Outpatient Medications on File Prior to Visit  Medication Sig Dispense Refill   acetaminophen  (TYLENOL ) 500 MG tablet Take 1,000 mg by mouth daily as needed for moderate pain.     amLODipine  (NORVASC ) 10 MG tablet Take 1 tablet (10 mg total) by mouth daily. 90 tablet 3   calcitRIOL  (ROCALTROL ) 0.25 MCG capsule Take 1 capsule (0.25 mcg total) by mouth daily. 90 capsule 3   furosemide  (LASIX ) 40 MG tablet Take 1 tablet (40 mg total) by mouth 2 (two) times daily. 180 tablet 3   gabapentin  (NEURONTIN ) 100 MG capsule 3 days per week QHS after dialysis 40 capsule 1   levothyroxine  (SYNTHROID ) 25 MCG tablet TAKE 1 TABLET(25 MCG) BY MOUTH DAILY BEFORE BREAKFAST 90 tablet 3    losartan  (COZAAR ) 100 MG tablet TAKE 1 TABLET(100 MG) BY MOUTH DAILY 90 tablet 1   metoprolol  succinate (TOPROL -XL) 25 MG 24 hr tablet TAKE 1 TABLET(25 MG) BY MOUTH DAILY 90 tablet 1   potassium chloride  (KLOR-CON ) 10 MEQ tablet Take 1 tablet (10 mEq total) by mouth 2 (two) times daily. 180 tablet 3   rosuvastatin  (CRESTOR ) 40 MG tablet Take 1 tablet (40 mg total) by mouth daily. 90 tablet 3   sevelamer carbonate (RENVELA) 800 MG tablet Take 800 mg by mouth 3 (three) times daily with meals.     Accu-Chek Softclix Lancets lancets Use as instructed (Patient not taking: Reported on 11/30/2023) 100 each 12   glucose blood (ACCU-CHEK AVIVA PLUS) test strip Test daily (Patient not taking: Reported on 07/22/2023) 100 strip 3   No current facility-administered medications on file prior to visit.     The following portions of the patient's history were reviewed and updated as appropriate: allergies, current medications, past family history, past medical history, past social history, past surgical history and problem list.  ROS Otherwise as in subjective above    Objective: BP 138/82   Pulse 72   Temp 98.2 F (36.8  C)   Wt 183 lb 9.6 oz (83.3 kg)   LMP 03/23/2017   SpO2 98%   BMI 35.86 kg/m   General appearance: alert, no distress, well developed, well nourished HEENT: normocephalic, sclerae anicteric, conjunctiva pink and moist, left TM normal, right TM with some mild erythema and cloudiness behind the eardrum,, nares patent, no discharge or erythema, pharynx normal Oral cavity: MMM, no lesions Neck: supple, no lymphadenopathy, no thyromegaly, no masses    Assessment: Encounter Diagnoses  Name Primary?   Ear pressure, right Yes   Right ear pain    Subacute otitis media, unspecified otitis media type      Plan: Discussed findings, symptoms.  Begin recommendations below   Recommendations: Drink plenty of water  throughout the day 80 to 100 ounces water  daily Begin Flonase  nasal spray daily for the next week Begin cetirizine Zyrtec 5 mg daily at bedtime for the next week Lets do 5 more days of the antibiotic, daily after dialysis or in the evening The symptoms should gradually resolve over the next week You can use Tylenol  as needed for pain, 325 mg twice daily as needed for pain If not much improved by Monday or Tuesday then let me know  Yaa was seen today for acute visit.  Diagnoses and all orders for this visit:  Ear pressure, right  Right ear pain  Subacute otitis media, unspecified otitis media type  Other orders -     fluticasone (FLONASE) 50 MCG/ACT nasal spray; Place 2 sprays into both nostrils daily. -     amoxicillin -clavulanate (AUGMENTIN ) 500-125 MG tablet; Take 1 tablet by mouth daily. After dialysis -     cetirizine (ZYRTEC) 5 MG tablet; Take 1 tablet (5 mg total) by mouth daily.    Follow up: prn

## 2024-02-04 NOTE — Patient Instructions (Signed)
 Recommendations: Drink plenty of water  throughout the day 80 to 100 ounces water  daily Begin Flonase nasal spray daily for the next week Begin cetirizine Zyrtec 5 mg daily at bedtime for the next week Lets do 5 more days of the antibiotic, daily after dialysis or in the evening The symptoms should gradually resolve over the next week You can use Tylenol  as needed for pain, 325 mg twice daily as needed for pain If not much improved by Monday or Tuesday then let me know

## 2024-04-21 NOTE — Progress Notes (Signed)
 Transplant Consultation Linda Black [id number] Thu 04/21/2024 Last visit Linda Black [place], [place] 06/10/22  Nephrologist/PCP:  [facility]    CC:  Pre-evaluation for Kidney transplant -- follow-up . She is still in evaluation, but returns as has not been seen in clinic in 2 years.  HPI:  Linda Black is a pleasant 53 y.o. female who presents today to the transplant clinic for evaluation for possible Kidney transplantation. She was diagnosed with End stage renal disease secondary to IgA Nephropathy, on  Hemodialysis    since 03/31/2023 .  HTN, DM2, GERD, OA, chronic LBP, obesity, HLD, hypothyroidism and fatty liver.  Patient is accompanied by her daughter who is able to translate for her.  and she does not speak [place].  She underwent a renal biopsy in 2018 that showed IgA with focal proliferative and sclerosing with moderate arteriosclerosis and moderate tubulointerstitial scarring.  She was treated with steroids and cytoxan  from 2018-2019 and then stopped.      Since last visit, she has lost weight. She has started dialysis.  Dialysis issues: No,  Urologic issues: Yes,  oliguric Diabetes: No, She has a history of diabetes for about 5 years and was on long acting insulin  for a few years but states no diabetic medications for the last several years.  No history of amputations, foot wounds, ulcers, retinopathy or gastroparesis. She has BLE neuropathy.  HTN: Yes,  requiring amlodipine , furosemide , losartan , metoprolol  She had had HTN for about 20 years  Cardiac issues: No,  Respiratory issues: No,  Infectious issues: Yes, She moved from [place] in 2006 to [place]  completed rifampin for latent TB Substance use: No,  Cancer history: No,  Functional issues: No, She underwent a right TKA ~2022 for OA and has done well with this.  She does have chronic LBP with sciatica/DDD but no plans for any intervention at this time. Getting steroid injections.  Abdominopelvic surgeries: No,  Anesthesia  history: Yes,  no issues Sensitization: Yes,  pregnancy Blood thinners: No,  Hospitalizations in past year: No, No prior abdominal surgeries or blood transfusions. She has had five pregnancies.  There are some reports of running out of medications for several months listed in the chart a few times by PCP.  Patient does have her medications all with her today and her daughter was able to verbalize what she is taking.  Other issues potentially affecting her care: Yes,  speaks [place] dialect; above-average social risk     Problem List[1] Current Rx ordered in [facility][2] Allergies[3] Surgical History[4] Family History[5] Social History[6]  ROS:  A complete review of symptoms was obtained and negative save as noted otherwise.  Physical Exam: Vitals:   04/21/24 1336  BP: (!) 164/86  Pulse: 67  Temp: 98 F (36.7 C)  TempSrc: Temporal  SpO2: 100%  Weight: 80.4 kg (177 lb 4.8 oz)  Height: 1.549 m (5' 1)   Body mass index is 33.5 kg/m. Wt Readings from Last 3 Encounters:  04/21/24 80.4 kg (177 lb 4.8 oz)  03/17/23 87.6 kg (193 lb 1.6 oz)  09/16/22 89.2 kg (196 lb 9.6 oz)    General Appearance:  53 y.o. female alert and oriented x 3. No acute distress. Presents with daughter today.   Head:  Normocephalic, atraumatic. No obvious caries, abscesses or periodontal disease. Moderate damage without signs of infection  Neck: No masses or LA. Thyroid  wnl. No stridor or JVD above angle of jaw.  Eyes:  Conjunctivae clear. No scleral icterus. Grossly EOMI and  conjugate.  Chest/Lungs:  Respirations unlabored. Clear to auscultation bilaterally, no wheezes, rhonchi or rales.  Heart:  Regular rate and rhythm, no murmur, click, rub or gallop. Good distal and femoral pulses  Abdomen:  Soft, non-tender, non-distended. Moderate soft pannus. Surgical scars not evident    Extremities:  Minimal equal edema.   Skin:  No rashes or lesions on examined skin. Fair, c/w solar exposure  Neurologic:   No focal deficits. No tremor. Grossly intact gait and strength  Psych:   Judgement and memory intact. Affect appropriate.    Nursing note reviewed.  Labs: ABO: A Lab Results  Component Value Date   WBC 7.1 06/09/2022   PLT 373 06/09/2022   HGB 11.4 (L) 06/09/2022   HCT 34.1 (L) 06/09/2022   Lab Results  Component Value Date   NA 138 08/07/2022   K 4.1 08/07/2022   CL 106 08/07/2022   CO2 19 (L) 08/07/2022   BUN 59 (H) 08/07/2022   CREATININE 4.25 (H) 08/07/2022   MG 2.2 06/09/2022   PHOS 6.3 (H) 06/09/2022    Lab Results  Component Value Date   INR 0.96 06/09/2022   Lab Results  Component Value Date   BILITOT 0.6 08/07/2022   ALT 18 08/07/2022   AST 13 08/07/2022   AMYLASE 94 06/09/2022   LIPASE 47 06/09/2022    HX MAGNESIUM  Date Value Ref Range Status  06/09/2022 2.2 1.9 - 2.7 MG/DL Final    Comment:    Patients taking eltrombopag at doses >/= 100 mg daily may show falsely elevated values of 10% or greater.   Lab Results  Component Value Date   CALCIUM  9.3 08/07/2022   PHOS 6.3 (H) 06/09/2022    Lab Results  Component Value Date   CHOL 171 06/09/2022   TRIG 205 (H) 06/09/2022   HDL 81 06/09/2022    Lab Results  Component Value Date/Time   HEPBCAB Reactive (A) 06/09/2022 12:38 PM   Lab Results  Component Value Date   HGBA1C 5.9 (H) 06/09/2022   HGBA1C  06/09/2022     Comment:     Presence of heterozygote variants in patients' samples do not interfere with accurate measurements of HbA1c using Trinity affinity boronate chromatography, when no other clinical conditions affecting quality/quantity of HbA and/or quantity of hemoglobin, overall, as well as quality/quantity of RBCs are concurrently present.   Presence of homozygote variants and/or other medical conditions affecting the quality/quantity of HbA (e.g. alpha- and beta-thalassemia) and/or quantity of hemoglobin, overall, as well as quality/quantity of RBCs (e.g. anemia of any cause) trigger  inaccurate evaluations of the glycated HbA1c with any analytical method, including Trinity affinity boronate chromatography.  In these circumstances, fructosamine testing for these patients is recommended.  Hb F present at concentrations of 11% or above can interfere with accurate measurements of HbA1c.  In these circumstances, fructosamine testing for these patients is recommended.       Imaging: CXR: Results for orders placed during the hospital encounter of 04/21/24  XR Chest 2 Views  Narrative XR CHEST 2 VIEWS, 04/21/2024 12:02 PM  INDICATION: Encounter for other preprocedural examination \ Z01.818 Encounter for other preprocedural examination \ N18.6 End stage renal disease    (CMD) COMPARISON: Chest radiograph from 05/26/2022  FINDINGS:  Cardiovascular: Enlarged cardiopericardial silhouette. Unchanged prominent pulmonary vasculatures. Mediastinum: Within normal limits. Lungs/pleura: No overt evidence of pulmonary edema, pleural effusion, or pneumothorax. Upper abdomen: Visualized portions are unremarkable. Chest wall/osseous structures: Unremarkable.  Impression Cardiomegaly, stable.  Prominent  pulmonary vasculatures could be due to low lung volume versus mild interstitial pulmonary edema.  CT: Yes  ECHO: Yes and Needs to update Stress: Yes and Needs to update Carotid/iliac duplex: Needs carotids (h/o HTN and DM) CATH: Doesn't need  Mammogram: Yes  Colonoscopy/Cologuard: Yes   Patient Education: Evaluation: The patient was informed about the stepwise process of evaluation and that after completion of all testing her case would be presented and discussed in detail at our weekly Pre-Transplant Committee meeting. At this time, a group decision will be made regarding her candidacy for placement on the waiting list and subsequent transplantation.   Transplant procedure: The patient was explained that the native kidneys are not removed during the renal transplant, in which  the transplanted kidney is placed in the lower abdomen and attached to the pelvic vessels and the urinary bladder. The major potential complications of transplantation such as bleeding, thrombosis, vascular or visceral injury, infection, wound problems, urine leak or blockage, delayed non-function of the kidney, and heart/lung/GI problems were discussed. The patient voiced understanding of these potential problems.  Immunosuppression: The patient was also explained about the need for and potential side effects of chronic immunosuppression such as hypertension, diabetes, GI problems, tremors and neurologic side effects, kidney problems, dyslipidemia, and the risks of rejection, infection, and certain types of cancers. The patient was also explained the importance of medication adherence long-term and voiced understanding of these issues.  Post-Transplant Clinic Visits: The patient was also informed about the frequency of visits that are necessary for at least the first year following the transplant before referral back to her local nephrologist. The patient voiced understanding of this need.   Types of Transplant: The patient was also educated about the different types of transplants including living (related, unrelated, paired donor exchange) and deceased donor transplants (including increased-risk, standard- and expanded-criteria donors), as well as the Kidney Donor Profile Index. Success and longevity rates were discussed; the patient voiced understanding of this discussion and asked appropriate questions. We discussed with the patient the importance and benefits of living donation over deceased donor kidney transplantation.    ASSESSMENT AND PLAN:  Linda Black is a pleasant 53 y.o. female who presents today to the transplant clinic for evaluation for possible Kidney transplantation. She was diagnosed with End stage renal disease secondary to IgA Nephropathy, on  Hemodialysis    since 03/31/2023.   She  makes a small amount of urine/day, has good functional status, and has good social support and resources.   Surgical Perspective: The patient has a Body mass index is 33.5 kg/m. and weighs 80.4 kg (177 lb 4.8 oz) in the clinic today. Iliac depth is less than 20cm. She does have acceptable vessel targets.   Social Situation: Patient lives with daughter, mother  in [place]. Her daughter will be her primary caregiver(s) after transplant. The patient has other family and friends that are willing and able to help care for the patient post-transplant.   Per social work, she is above-average risk (4-6).  Distance: 0-2 hours - 0 points Language : Requires interpreter - 2 points   Transport : Complete dependence on family/caregiver - 2 points  Caregiving : Not independent in medication management - 1 point   Alcohol Use: Within NIH guidelines - 0 point   Other : Questionable appreciation for the role/responsibilities of a transplant recipient - 1 point Dialysis: 0-3 years - 0 points   Risk: above-average risk (4-6)   Overall candidacy:  I believe  the patient is a(n) satisfactory candidate for transplantation pending a negative medical evaluation.   Further Investigations Recommended:  The patient does have risk factors for vessel disease: DM  To complete her workup, we recommend: - surface echocardiogram to evaluate LV systolic function and evaluate for valvular disease  - cardiac stress test in the form of dobutamine stress echocardiogram  - referral to derm for fair skin and lesions c/w solar exposure - dentist (medicare/medicaid)   Electronically signed by: Damien Lamarr Almarie Marlane, MD 04/21/2024 2:56 PM      Overall, this patient is a(n) satisfactory candidate for transplantation. Issues affecting her surgical candidacy include:  obesity. By imaging and exam, patient is technically transplantable.   Discussed surgery and its risks, benefits, and likely  postoperative course. Recommend weight loss for improved ease of surgery, decreased wound infection risk, and expanded pool of acceptable organ quality. Recommend staying active and building muscle mass for improved tolerance of surgery and postoperative recovery. Encouraged floor bike (owns) and water  aerobics given arthritis pain.  PVD: No  Dual: No for social risk High KDPI: No for obesity and social issues making management of DGF difficult HCV NAT(+): No for not interested, high social risk Yes potential living donors identified -- son, grandchildren ECD: No for obesity and social issues making management of DGF difficult Pancreas candidacy: No for not requiring insulin   If/when active, patient should return for maintenance evaluation every 2 year(s).  I have personally spent 90 minutes involved in face-to-face and non-face-to-face activities for this patient on the day of the visit.  Professional time spent includes the following activities, in addition to those noted in the documentation: preparing to see the patient, obtaining and/or reviewing separately obtained history, performing a medically appropriate examination and evaluation, counseling and educating the patient/caregiver, ordering medications, test, or procedures, referring and communicating with other health care professionals (not separately reported), documenting clinical information in the electronic medical record, and individually interpreting results (not separately reported) and communicating results to the patient/caregiver  Electronically signed by: Damien Lamarr Almarie Marlane, MD 04/21/2024 2:56 PM       [1] Patient Active Problem List Diagnosis  . HTN (hypertension)  . Generalized OA  . DDD (degenerative disc disease), lumbar  . Low back pain due to bilateral sciatica  . GERD (gastroesophageal reflux disease)  . Status post total knee replacement, right  . Fatty liver  . HLD (hyperlipidemia)  . Obesity  due to excess calories with serious comorbidity  . Hypothyroidism  . Stage 4 chronic kidney disease (HCC)  . Latent tuberculosis by blood test  . Impaired instrumental activities of daily living (IADL)  . Pre-transplant evaluation for kidney transplant  . IgA nephropathy  [2] Meds Ordered in [facility]  Medication Sig Dispense Refill  . amLODIPine  (NORVASC ) 10 mg tablet Take 10 mg by mouth Once Daily.    . levothyroxine  (SYNTHROID ) 25 mcg tablet Take 25 mcg by mouth Once Daily.    . losartan  (COZAAR ) 100 mg tablet Take 100 mg by mouth Once Daily.    . metoprolol  succinate (TOPROL  XL) 25 mg 24 hr tablet Take 25 mg by mouth Once Daily.    . rosuvastatin  (CRESTOR ) 40 mg tablet Take 40 mg by mouth Once Daily.    . sevelamer carbonate (RENVELA) 800 mg tablet TAKE 2 TABLETS BY MOUTH THREE TIMES DAILY WITH MEALS AND 1 TABLET TWICE DAILY WITH SNACKS    . calcitrioL  (ROCALTROL ) 0.25 mcg capsule Take 0.25 mcg by mouth Once Daily. (  Patient not taking: Reported on 04/21/2024)    . furosemide  (LASIX ) 40 mg tablet Take 40 mg by mouth 2 (two) times a day. (Patient not taking: Reported on 04/21/2024)     No current Epic-ordered facility-administered medications on file.  [3] Allergies Allergen Reactions  . Lisinopril  Swelling    Angioedema  . Tropical Fruit Flavor Angioedema    JACKFRUIT  [4] Past Surgical History: Procedure Laterality Date  . KNEE ARTHROSCOPY     Procedure: KNEE ARTHROSCOPY  . TOTAL KNEE ARTHROPLASTY Right    Procedure: TOTAL KNEE ARTHROPLASTY  [5] Family History Problem Relation Name Age of Onset  . Diabetes Mother    . Hypertension Father    [6] Social History Tobacco Use  . Smoking status: Never  . Smokeless tobacco: Never  Substance Use Topics  . Alcohol use: Never  . Drug use: Never

## 2024-04-21 NOTE — Progress Notes (Signed)
   04/21/24 1429  Weakness (Grip Strength)  Weakeness: Grip Strength 1 17.7  Weakeness: Grip Strength 2 18.6  Weakeness: Grip Strength 3 17.3  Weakness (Grip Strength) Score 17.87  Sex Adjusted Average -0.63  Manual Grip Score 0  Chair Stands  Manual Total Chair Stand Score 4 (9.06)  Balance Testing  Manual Total Balance Testing Score 4 (10x3)  15 Foot Walk  Manual Total 15 Foot Walk Testing Score 4 (3.90, 3.67)  OTHER  Total Fitness Score 12   FIT TESTING Deferred Eval  GENERAL HEALTH:  In general, would you say your health is:    Fair  Compared to one year ago, how would you rate your health in general now?   much worse   LIMITATIONS OF ACTIVITIES:    Vigorous activities, such as running, lifting heavy objects, participating in strenuous sports.   1- Yes, Limited a little  Moderate activities, such as moving a table, pushing a vacuum cleaner, bowling, or playing golf 1- Yes, Limited a little  Lifting or carrying groceries 1- Yes, Limited a little  Climbing several flights of stairs 1- Yes, Limited a little  Climbing one flight of stairs 1- Yes, Limited a little  Bending, kneeling, or stooping 1- Yes, Limited a little  Walking more than a mile 1- Yes, Limited a little  Walking several blocks 1- Yes, Limited a little  Walking one block 1- Yes, Limited a little  Bathing or dressing yourself 1- Yes, Limited a little   SELF REPORT LIMITATIONS OF ACTIVITIES SCORE: 10   2 pts yes, limited a lot; 1 pt yes, limited a little; 0 pts No, not limited at all  <8 FIT, >/=8-14 intermediate, 15-20 Not fit   Perform short physical performance testing in the following conditions (charted below if applicable):  Limitations score >/= 8 Age >/= 6 Assistive device present during exam Appearance of frailty despite above criteria   Do you use oxygen at home or at dialysis?  No  Do you ever have trouble with blood pressure that is too low?  Yes  Do you own any of  the following (check all that apply)  None of the Above  How often do you use the motorized carts available to pick up your groceries?  Sometimes  Have you lost more than 10 lbs without trying to in the last 12 months?  If so, how much?   No  Do you need any assistance performing any of the following activities of daily living?  None of the Above  Do you use a pillbox?   Yes   Who fills your pillbox?  Someone who lives with me fills it  Have you had any falls?  Never/None in the last year

## 2024-05-03 ENCOUNTER — Ambulatory Visit: Attending: Vascular Surgery | Admitting: Physician Assistant

## 2024-05-03 VITALS — BP 170/78 | HR 65 | Temp 98.0°F | Resp 18 | Ht 60.0 in | Wt 179.6 lb

## 2024-05-03 DIAGNOSIS — T82898A Other specified complication of vascular prosthetic devices, implants and grafts, initial encounter: Secondary | ICD-10-CM | POA: Diagnosis present

## 2024-05-03 DIAGNOSIS — Z992 Dependence on renal dialysis: Secondary | ICD-10-CM | POA: Insufficient documentation

## 2024-05-03 DIAGNOSIS — N186 End stage renal disease: Secondary | ICD-10-CM | POA: Insufficient documentation

## 2024-05-03 NOTE — Progress Notes (Unsigned)
 Office Note   History of Present Illness   Linda Black is a 53 y.o. ([birth year]) female who presents for follow up for dialysis access.  Current Outpatient Medications  Medication Sig Dispense Refill   Accu-Chek Softclix Lancets lancets Use as instructed (Patient not taking: Reported on 11/30/2023) 100 each 12   acetaminophen  (TYLENOL ) 500 MG tablet Take 1,000 mg by mouth daily as needed for moderate pain.     amLODipine  (NORVASC ) 10 MG tablet Take 1 tablet (10 mg total) by mouth daily. 90 tablet 3   amoxicillin -clavulanate (AUGMENTIN ) 500-125 MG tablet Take 1 tablet by mouth daily. After dialysis 5 tablet 0   calcitRIOL  (ROCALTROL ) 0.25 MCG capsule Take 1 capsule (0.25 mcg total) by mouth daily. 90 capsule 3   cetirizine  (ZYRTEC ) 5 MG tablet Take 1 tablet (5 mg total) by mouth daily. 10 tablet 0   fluticasone  (FLONASE ) 50 MCG/ACT nasal spray Place 2 sprays into both nostrils daily. 10 g 0   furosemide  (LASIX ) 40 MG tablet Take 1 tablet (40 mg total) by mouth 2 (two) times daily. 180 tablet 3   gabapentin  (NEURONTIN ) 100 MG capsule 3 days per week QHS after dialysis 40 capsule 1   glucose blood (ACCU-CHEK AVIVA PLUS) test strip Test daily (Patient not taking: Reported on 07/22/2023) 100 strip 3   levothyroxine  (SYNTHROID ) 25 MCG tablet TAKE 1 TABLET(25 MCG) BY MOUTH DAILY BEFORE BREAKFAST 90 tablet 3   losartan  (COZAAR ) 100 MG tablet TAKE 1 TABLET(100 MG) BY MOUTH DAILY 90 tablet 1   metoprolol  succinate (TOPROL -XL) 25 MG 24 hr tablet TAKE 1 TABLET(25 MG) BY MOUTH DAILY 90 tablet 1   potassium chloride  (KLOR-CON ) 10 MEQ tablet Take 1 tablet (10 mEq total) by mouth 2 (two) times daily. 180 tablet 3   rosuvastatin  (CRESTOR ) 40 MG tablet Take 1 tablet (40 mg total) by mouth daily. 90 tablet 3   sevelamer carbonate (RENVELA) 800 MG tablet Take 800 mg by mouth 3 (three) times daily with meals.     No current facility-administered medications for this visit.    ***REVIEW OF SYSTEMS  (negative unless checked):   Cardiac:  []  Chest pain or chest pressure? []  Shortness of breath upon activity? []  Shortness of breath when lying flat? []  Irregular heart rhythm?  Vascular:  []  Pain in calf, thigh, or hip brought on by walking? []  Pain in feet at night that wakes you up from your sleep? []  Blood clot in your veins? []  Leg swelling?  Pulmonary:  []  Oxygen at home? []  Productive cough? []  Wheezing?  Neurologic:  []  Sudden weakness in arms or legs? []  Sudden numbness in arms or legs? []  Sudden onset of difficult speaking or slurred speech? []  Temporary loss of vision in one eye? []  Problems with dizziness?  Gastrointestinal:  []  Blood in stool? []  Vomited blood?  Genitourinary:  []  Burning when urinating? []  Blood in urine?  Psychiatric:  []  Major depression  Hematologic:  []  Bleeding problems? []  Problems with blood clotting?  Dermatologic:  []  Rashes or ulcers?  Constitutional:  []  Fever or chills?  Ear/Nose/Throat:  []  Change in hearing? []  Nose bleeds? []  Sore throat?  Musculoskeletal:  []  Back pain? []  Joint pain? []  Muscle pain?   Physical Examination   Vitals:   05/03/24 0858  BP: (!) 170/78  Pulse: 65  Resp: 18  Temp: 98 F (36.7 C)  TempSrc: Temporal  Weight: 179 lb 9.6 oz (81.5 kg)  Height: 5' (1.524 m)  Body mass index is 35.08 kg/m.  General:  WDWN in NAD; vital signs documented above Gait: Not observed HENT: WNL, normocephalic Pulmonary: normal non-labored breathing , without rales, rhonchi,  wheezing Cardiac: {Desc; regular/irreg:14544} HR, without murmurs {With/Without:20273} carotid bruit*** Abdomen: soft, NT, no masses Skin: {With/Without:20273} rashes Vascular Exam/Pulses: *** radial and brachial pulses bilaterally Extremities: {With/Without:20273} ischemic changes, {With/Without:20273} gangrene , {With/Without:20273} cellulitis; {With/Without:20273} open wounds;  Musculoskeletal: no muscle wasting or  atrophy  Neurologic: A&O X 3;  No focal weakness or paresthesias are detected Psychiatric:  The pt has {Desc; normal/abnormal:11317::Normal} affect.   Non-invasive Vascular Imaging   Left/Right Arm Access Duplex  (***):    BUE Doppler (***):  R arm:  Brachial: {Signals:19197::none,mono,bi,tri}, *** mm Radial: {Signals:19197::none,mono,bi,tri}, *** mm Ulnar: {Signals:19197::none,mono,bi,tri}, *** mm L arm:  Brachial: {Signals:19197::none,mono,bi,tri}, *** mm Radial: {Signals:19197::none,mono,bi,tri}, *** mm Ulnar: {Signals:19197::none,mono,bi,tri}, *** mm  BUE Vein Mapping  (***):  R arm: acceptable vein conduits include *** L arm: acceptable vein conduits include ***    Medical Decision Making   [name] is a 53 y.o. female who presents with {KidneyDisease:19197::ESRD,chronic kidney disease stage ***} for dialysis access follow up  ***   Ahmed Holster PA-C [facility] Office: [phone]  Clinic MD: ***

## 2024-05-09 ENCOUNTER — Ambulatory Visit: Admitting: Medical

## 2024-05-09 ENCOUNTER — Encounter: Payer: Self-pay | Admitting: Medical

## 2024-05-09 VITALS — BP 130/80 | HR 72 | Ht 60.5 in | Wt 179.2 lb

## 2024-05-09 DIAGNOSIS — G8929 Other chronic pain: Secondary | ICD-10-CM

## 2024-05-09 DIAGNOSIS — K760 Fatty (change of) liver, not elsewhere classified: Secondary | ICD-10-CM

## 2024-05-09 DIAGNOSIS — E559 Vitamin D deficiency, unspecified: Secondary | ICD-10-CM

## 2024-05-09 DIAGNOSIS — R29898 Other symptoms and signs involving the musculoskeletal system: Secondary | ICD-10-CM

## 2024-05-09 DIAGNOSIS — Z603 Acculturation difficulty: Secondary | ICD-10-CM

## 2024-05-09 DIAGNOSIS — Z7189 Other specified counseling: Secondary | ICD-10-CM

## 2024-05-09 DIAGNOSIS — Z0000 Encounter for general adult medical examination without abnormal findings: Secondary | ICD-10-CM

## 2024-05-09 DIAGNOSIS — N186 End stage renal disease: Secondary | ICD-10-CM

## 2024-05-09 DIAGNOSIS — E1149 Type 2 diabetes mellitus with other diabetic neurological complication: Secondary | ICD-10-CM

## 2024-05-09 DIAGNOSIS — R12 Heartburn: Secondary | ICD-10-CM

## 2024-05-09 DIAGNOSIS — E785 Hyperlipidemia, unspecified: Secondary | ICD-10-CM | POA: Diagnosis not present

## 2024-05-09 DIAGNOSIS — I10 Essential (primary) hypertension: Secondary | ICD-10-CM

## 2024-05-09 DIAGNOSIS — E039 Hypothyroidism, unspecified: Secondary | ICD-10-CM

## 2024-05-09 DIAGNOSIS — M5440 Lumbago with sciatica, unspecified side: Secondary | ICD-10-CM

## 2024-05-09 DIAGNOSIS — Z758 Other problems related to medical facilities and other health care: Secondary | ICD-10-CM

## 2024-05-09 MED ORDER — LANCETS MISC. MISC: 1 refills | Status: AC

## 2024-05-09 MED ORDER — BLOOD GLUCOSE TEST VI STRP: ORAL_STRIP | 1 refills | Status: AC

## 2024-05-09 MED ORDER — REPATHA SURECLICK 140 MG/ML ~~LOC~~ SOAJ: 140.0000 mg | SUBCUTANEOUS | 2 refills | Status: AC

## 2024-05-09 MED ORDER — BLOOD GLUCOSE MONITORING SUPPL DEVI: 0 refills | Status: AC

## 2024-05-09 MED ORDER — FAMOTIDINE 10 MG PO TABS: 10.0000 mg | ORAL_TABLET | Freq: Every day | ORAL | 1 refills | Status: AC | PRN

## 2024-05-09 MED ORDER — LANCET DEVICE MISC: 0 refills | Status: AC

## 2024-05-09 NOTE — Progress Notes (Signed)
 Subjective:    Linda Black is a 53 y.o. female who presents for Preventative Services visit and chronic medical problems/med check visit.    Here today with interpreter Y Hin   Primary Care Provider Tayjah Lobdell, Alm RAMAN, PA-C here for primary care  Current Health Care Team: Patient Care Team: Jeryl Wilbourn, Alm RAMAN, PA-C as PCP - General (Family Medicine) [facility], Donell Cardinal, MD as Attending Physician (Endocrinology) Prescilla Beams, MD as Consulting Physician (Nephrology) Lucilla Lynwood BRAVO, MD (Inactive) as Consulting Physician (Orthopedic Surgery) Jerri Kay HERO, MD as Attending Physician (Orthopedic Surgery) Dr. Gardiner Masters, orthopedics Dr. Belvie Just Transplant team in [facility] you may have received from other than [facility] providers in the past year (date may be approximate) Transplant service, initial evaluation, vascular surgery, cardiology  Exercise Current exercise habits: limited due to c/o back and leg weakness, walking some  Nutrition/Diet Current diet: eats 3 times daily, including small portions, sometimes fruit, gets vegetables.   Depression Screen    05/09/2024    9:14 AM  Depression screen PHQ 2/9  Decreased Interest 0  Down, Depressed, Hopeless 0  PHQ - 2 Score 0    Activities of Daily Living Screen/Functional Status Survey Is the patient deaf or have difficulty hearing?: No Does the patient have difficulty seeing, even when wearing glasses/contacts?: Yes (blurry vision due for eye exam) Does the patient have difficulty concentrating, remembering, or making decisions?: Yes Does the patient have difficulty walking or climbing stairs?: Yes (if more than 5, hard to climb) Does the patient have difficulty dressing or bathing?: No Does the patient have difficulty doing errands alone such as visiting a doctor's office or shopping?: No  Can patient draw a clock face showing 3:15 oclock, yes  Fall Risk Screen    05/09/2024     9:14 AM 07/22/2023   10:01 AM 03/19/2022    9:03 AM 03/19/2021   10:55 AM 01/09/2020    8:32 AM  Fall Risk   Falls in the past year? 0 0 0 0 0  Number falls in past yr: 0 0 0 0   Injury with Fall? 0 0 0 0   Risk for fall due to : No Fall Risks No Fall Risks No Fall Risks No Fall Risks   Follow up Falls evaluation completed Falls evaluation completed  Falls evaluation completed       Data saved with a previous flowsheet row definition    Advanced directives Does patient have a Health Care Power of Attorney? No Does patient have a Living Will? No  Past Medical History:  Diagnosis Date   Anxiety    Diabetes mellitus without complication (HCC)    Dr. Sam - diet controlled type 2   Fatty liver 2019   elevated LFTs, negative for Hep B and Hep C 04/2018   GERD (gastroesophageal reflux disease)    Heart murmur    Hepatitis    Hyperlipidemia    Hypertension    Hypothyroidism 2019   IgA nephropathy 2019   per biopsy, Dr. Beams Prescilla; Stage 4 ckd   Language barrier    Lumbar stenosis    Dr. Lucilla   Obesity    Osteoarthritis    Proteinuria     Past Surgical History:  Procedure Laterality Date   AV FISTULA PLACEMENT Left 10/13/2022   Procedure: LEFT ARM STAGE 1 BRACHIOBASILIC FISTULA CREATION;  Surgeon: Magda Debby SAILOR, MD;  Location: [facility] OR;  Service: Vascular;  Laterality: Left;  BASCILIC VEIN TRANSPOSITION Left 12/01/2022   Procedure: LEFT ARM SECOND STAGE BASILIC VEIN TRANSPOSITION;  Surgeon: Magda Debby SAILOR, MD;  Location: [facility] OR;  Service: Vascular;  Laterality: Left;   RENAL BIOPSY  2018   IgA nephropathy, Dr. Curtis Heman   TOTAL KNEE ARTHROPLASTY Right 02/11/2021   Procedure: RIGHT TOTAL KNEE ARTHROPLASTY;  Surgeon: Jerri Kay HERO, MD;  Location: [facility] OR;  Service: Orthopedics;  Laterality: Right;    Social History   Socioeconomic History   Marital status: Divorced    Spouse name: Not on file   Number of children: 5   Years of education: Not on file    Highest education level: Not on file  Occupational History   Occupation: homemaker  Tobacco Use   Smoking status: Never   Smokeless tobacco: Never  Vaping Use   Vaping status: Never Used  Substance and Sexual Activity   Alcohol use: Never   Drug use: Never   Sexual activity: Not on file  Other Topics Concern   Not on file  Social History Narrative   Lives with family, non english speaking, likes to garden.   01/2020   Social Drivers of Health   Financial Resource Strain: High Risk (05/09/2024)   Overall Financial Resource Strain (CARDIA)    Difficulty of Paying Living Expenses: Very hard  Food Insecurity: Food Insecurity Present (05/09/2024)   Hunger Vital Sign    Worried About Running Out of Food in the Last Year: Sometimes true    Ran Out of Food in the Last Year: Sometimes true  Transportation Needs: No Transportation Needs (05/09/2024)   PRAPARE - Administrator, Civil Service (Medical): No    Lack of Transportation (Non-Medical): No  Physical Activity: Insufficiently Active (05/09/2024)   Exercise Vital Sign    Days of Exercise per Week: 4 days    Minutes of Exercise per Session: 30 min  Stress: No Stress Concern Present (05/09/2024)   [employer] of Occupational Health - Occupational Stress Questionnaire    Feeling of Stress: Only a little  Social Connections: Moderately Isolated (05/09/2024)   Social Connection and Isolation Panel    Frequency of Communication with Friends and Family: Once a week    Frequency of Social Gatherings with Friends and Family: Twice a week    Attends Religious Services: More than 4 times per year    Active Member of [employer] or Organizations: No    Attends Banker Meetings: Patient declined    Marital Status: Divorced  Catering manager Violence: Not At Risk (05/09/2024)   Humiliation, Afraid, Rape, and Kick questionnaire    Fear of Current or Ex-Partner: No    Emotionally Abused: No    Physically Abused: No     Sexually Abused: No    Family History  Problem Relation Age of Onset   Diabetes Mother    Hypertension Father    Colon cancer Neg Hx    Stomach cancer Neg Hx    Esophageal cancer Neg Hx    Rectal cancer Neg Hx    Liver cancer Neg Hx      Current Outpatient Medications:    acetaminophen  (TYLENOL ) 500 MG tablet, Take 1,000 mg by mouth daily as needed for moderate pain., Disp: , Rfl:    amLODipine  (NORVASC ) 10 MG tablet, Take 1 tablet (10 mg total) by mouth daily., Disp: 90 tablet, Rfl: 3   Blood Glucose Monitoring Suppl DEVI, Test 1-2 times day. Pend on Insurance, Disp: 1 each,  Rfl: 0   calcitRIOL  (ROCALTROL ) 0.25 MCG capsule, Take 1 capsule (0.25 mcg total) by mouth daily., Disp: 90 capsule, Rfl: 3   Evolocumab (REPATHA SURECLICK) 140 MG/ML SOAJ, Inject 140 mg into the skin every 14 (fourteen) days., Disp: 2 mL, Rfl: 2   famotidine  (PEPCID ) 10 MG tablet, Take 1 tablet (10 mg total) by mouth daily as needed for heartburn or indigestion. After dialysis on dialysis days, Disp: 30 tablet, Rfl: 1   fluticasone  (FLONASE ) 50 MCG/ACT nasal spray, Place 2 sprays into both nostrils daily., Disp: 10 g, Rfl: 0   furosemide  (LASIX ) 40 MG tablet, Take 1 tablet (40 mg total) by mouth 2 (two) times daily., Disp: 180 tablet, Rfl: 3   Glucose Blood (BLOOD GLUCOSE TEST STRIPS) STRP, Test 1-2 times daily, Disp: 100 strip, Rfl: 1   Lancet Device MISC, 1-2 times daily, Disp: 1 each, Rfl: 0   Lancets Misc. MISC, 1-2 times a day, Disp: 100 each, Rfl: 1   levothyroxine  (SYNTHROID ) 25 MCG tablet, TAKE 1 TABLET(25 MCG) BY MOUTH DAILY BEFORE BREAKFAST, Disp: 90 tablet, Rfl: 3   losartan  (COZAAR ) 100 MG tablet, TAKE 1 TABLET(100 MG) BY MOUTH DAILY, Disp: 90 tablet, Rfl: 1   metoprolol  succinate (TOPROL -XL) 25 MG 24 hr tablet, TAKE 1 TABLET(25 MG) BY MOUTH DAILY, Disp: 90 tablet, Rfl: 1   potassium chloride  (KLOR-CON ) 10 MEQ tablet, Take 1 tablet (10 mEq total) by mouth 2 (two) times daily., Disp: 180 tablet, Rfl:  3   rosuvastatin  (CRESTOR ) 40 MG tablet, Take 1 tablet (40 mg total) by mouth daily., Disp: 90 tablet, Rfl: 3   sevelamer carbonate (RENVELA) 800 MG tablet, Take 800 mg by mouth 3 (three) times daily with meals., Disp: , Rfl:   Allergies  Allergen Reactions   Lisinopril  Swelling    Angioedema    Covid-19 (Mrna) Vaccine Hives    Got quite sick after both initial doses   Other Swelling    [place] Fruit    History reviewed: allergies, current medications, past family history, past medical history, past social history, past surgical history and problem list  Chronic issues discussed: Lately she has been seeing the transplant team for evaluation of her [place].  She was not sure if [facility] [place] [facility]  She is compliant with her medications  She has concerns of ongoing weakness in both legs.  She has some pain in the low back and legs but mostly more the weakness symptoms.  No recent injury or trauma or fall.  Symptoms remain persistent for probably a year or more  She wanted to go back on medicine that she was given prior for her back but she does not remember the name of the medicine she uses her fluid pill typically daily sometimes twice daily, but often she will hold the medication on the day she has dialysis  She had a recent flu shot at dialysis center  Acute issues discussed: He does have some heartburn of late.  Over-the-counter remedy does not help.    Objective:      Biometrics BP 130/80   Pulse 72   Ht 5' 0.5 (1.537 m)   Wt 179 lb 3.2 oz (81.3 kg)   LMP 03/23/2017   SpO2 98%   BMI 34.42 kg/m   Wt Readings from Last 3 Encounters:  05/09/24 179 lb 3.2 oz (81.3 kg)  05/03/24 179 lb 9.6 oz (81.5 kg)  02/04/24 183 lb 9.6 oz (83.3 kg)   BP  Readings from Last 3 Encounters:  05/09/24 130/80  05/03/24 (!) 170/78  02/04/24 138/82    Gen: wd, wn nad HEENT: normocephalic, sclerae anicteric, TMs pearly, nares patent, no discharge or erythema,  pharynx normal Oral cavity: MMM, no lesions Neck: supple, no lymphadenopathy, no thyromegaly, no masses, no JVD or bruits Heart: RRR, normal S1, S2, no murmurs Lungs: CTA bilaterally, no wheezes, rhonchi, or rales Abdomen: +bs, soft, non tender, non distended, no masses, no hepatomegaly, no splenomegaly Musculoskeletal: nontender, no swelling, no obvious deformity, legs normal ROM Extremities: 1+ bilateral lower extremity nonpitting edema, otherwise no cyanosis, no clubbing Pulses: 2+ symmetric, upper and lower extremities, normal cap refill Neurological: leg strength 4-5 /5/ bilat strength, otherwise alert, oriented x 3, CN2-12 intact, strength normal upper extremities and lower extremities, sensation normal throughout, DTRs 2+ throughout, no cerebellar signs, gait normal Psychiatric: normal affect, behavior normal, pleasant   Breast/GYN-deferred   Assessment:   Encounter Diagnoses  Name Primary?   Welcome to Medicare preventive visit Yes   Chronic kidney disease requiring chronic dialysis (HCC)    Essential hypertension    Language barrier    Hyperlipidemia, unspecified hyperlipidemia type    Weakness of both lower extremities    Type 2 diabetes mellitus with neurological complications (HCC)    Hypothyroidism, unspecified type    Advanced directives, counseling/discussion    Chronic low back pain with sciatica, sciatica laterality unspecified, unspecified back pain laterality    Other diabetic neurological complication associated with type 2 diabetes mellitus (HCC)    Fatty liver    Vitamin D  deficiency      Plan:   A preventative services visit was completed today.  During the course of the visit today, we discussed and counseled about appropriate screening and preventive services.  A health risk assessment was established today that included a review of current medications, allergies, social history, family history, medical and preventative health history, biometrics, and  preventative screenings to identify potential safety concerns or impairments.  A personalized plan was printed today for your records and use.   Personalized health advice and education was given today to reduce health risks and promote self management and wellness.  Information regarding end of life planning was discussed today.   This visit was a preventative care visit, also known as wellness visit or routine physical.   Topics typically include healthy lifestyle, diet, exercise, preventative care, vaccinations, sick and well care, proper use of emergency dept and after hours care, as well as other concerns.     Recommendations: Continue to return yearly for your annual wellness and preventative care visits.  This gives us  a chance to discuss healthy lifestyle, exercise, vaccinations, review your chart record, and perform screenings where appropriate.  I recommend you see your eye doctor yearly for routine vision care.  I recommend you see your dentist yearly for routine dental care including hygiene visits twice yearly.   Vaccination recommendations were reviewed Immunization History  Administered Date(s) Administered   Influenza,inj,Quad PF,6+ Mos 04/13/2018, 04/18/2019, 06/23/2022   Influenza,trivalent, recombinat, inj, PF 06/16/2023   MMR 02/22/2004, 03/09/2009   PFIZER(Purple Top)SARS-COV-2 Vaccination 10/27/2019, 11/22/2019   PNEUMOCOCCAL CONJUGATE-20 04/23/2023   Pneumococcal Conjugate-13 05/17/2018   Td 02/22/2004, 04/24/2004, 11/13/2004   Tdap 03/19/2021    Had a recent flu shot at dialysis center  We discussed getting a shingles vaccine at her pharmacy   Screening for cancer: Colon cancer screening: 2023 colonoscopy reviewed.  Next colonoscopy due 2030  Breast cancer screening: You should  perform a self breast exam monthly.   We reviewed recommendations for regular mammograms and breast cancer screening.  Cervical cancer screening: We reviewed recommendations  for pap smear screening.  Pap smear up-to-date from 2023   Skin cancer screening: Check your skin regularly for new changes, growing lesions, or other lesions of concern Come in for evaluation if you have skin lesions of concern.  Lung cancer screening: If you have a greater than 20 pack year history of tobacco use, then you may qualify for lung cancer screening with a chest CT scan.   Please call your insurance company to inquire about coverage for this test.  We currently don't have screenings for other cancers besides breast, cervical, colon, and lung cancers.  If you have a strong family history of cancer or have other cancer screening concerns, please let me know.    Bone health: Get at least 150 minutes of aerobic exercise weekly Get weight bearing exercise at least once weekly Bone density test:  A bone density test is an imaging test that uses a type of X-ray to measure the amount of calcium  and other minerals in your bones. The test may be used to diagnose or screen you for a condition that causes weak or thin bones (osteoporosis), predict your risk for a broken bone (fracture), or determine how well your osteoporosis treatment is working. The bone density test is recommended for females 65 and older, or females or males <65 if certain risk factors such as thyroid  disease, long term use of steroids such as for asthma or rheumatological issues, vitamin D  deficiency, estrogen deficiency, family history of osteoporosis, self or family history of fragility fracture in first degree relative.    Heart health: Get at least 150 minutes of aerobic exercise weekly Limit alcohol It is important to maintain a healthy blood pressure and healthy cholesterol numbers  Heart disease screening: Screening for heart disease includes screening for blood pressure, fasting lipids, glucose/diabetes screening, BMI height to weight ratio, reviewed of smoking status, physical activity, and diet.    Goals  include blood pressure 120/80 or less, maintaining a healthy lipid/cholesterol profile, preventing diabetes or keeping diabetes numbers under good control, not smoking or using tobacco products, exercising most days per week or at least 150 minutes per week of exercise, and eating healthy variety of fruits and vegetables, healthy oils, and avoiding unhealthy food choices like fried food, fast food, high sugar and high cholesterol foods.    Other tests may possibly include EKG test, CT coronary calcium  score, echocardiogram, exercise treadmill stress test.   Medical care options: I recommend you continue to seek care here first for routine care.  We try really hard to have available appointments Monday through Friday daytime hours for sick visits, acute visits, and physicals.  Urgent care should be used for after hours and weekends for significant issues that cannot wait till the next day.  The emergency department should be used for significant potentially life-threatening emergencies.  The emergency department is expensive, can often have long wait times for less significant concerns, so try to utilize primary care, urgent care, or telemedicine when possible to avoid unnecessary trips to the emergency department.  Virtual visits and telemedicine have been introduced since the pandemic started in 2020, and can be convenient ways to receive medical care.  We offer virtual appointments as well to assist you in a variety of options to seek medical care.   Advanced Directives: I recommend you consider completing a Health  Care Power of Attorney and Living Will.   These documents respect your wishes and help alleviate burdens on your loved ones if you were to become terminally ill or be in a position to need those documents enforced.    You can complete Advanced Directives yourself, have them notarized, then have copies made for our office, for you and for anybody you feel should have them in safe  keeping.  Or, you can have an attorney prepare these documents.   If you haven't updated your Last Will and Testament in a while, it may be worthwhile having an attorney prepare these documents together and save on some costs.     Separate significant issues: CKD on dialysis - currently in evaluation stage with transplant team, recent labs reviewed  HTN - managed by nephrology, continue current therapy  Hyperlipidemia - most recent lipids not at goal.   -continue Rosuvastatin  40mg  but given leg weakness, cut in half to 20mg  daily -add Repatha injection 140mg  weekly  Weakness  of legs, back pain -cut rosuvastatin  down to 20mg  - follow up with orthopedics  Diabetes -updated Hgba1c today -controlled without medication currently  Hypothyroidism  -Continue levothyroxine  25 mcg daily  Fatty liver  -work on efforts to lose weight  Heartburn -begin trial of Famotidine , avoid GERD triggers   [place] was seen today for annual exam.  Diagnoses and all orders for this visit:  Welcome to Medicare preventive visit  Chronic kidney disease requiring chronic dialysis (HCC)  Essential hypertension  Language barrier  Hyperlipidemia, unspecified hyperlipidemia type  Weakness of both lower extremities  Type 2 diabetes mellitus with neurological complications (HCC) -     Hemoglobin A1c  Hypothyroidism, unspecified type -     TSH + free T4  Advanced directives, counseling/discussion  Chronic low back pain with sciatica, sciatica laterality unspecified, unspecified back pain laterality  Other diabetic neurological complication associated with type 2 diabetes mellitus (HCC)  Fatty liver  Vitamin D  deficiency  Other orders -     Blood Glucose Monitoring Suppl DEVI; Test 1-2 times day. Pend on Insurance -     Glucose Blood (BLOOD GLUCOSE TEST STRIPS) STRP; Test 1-2 times daily -     Lancet Device MISC; 1-2 times daily -     Lancets Misc. MISC; 1-2 times a day -     famotidine   (PEPCID ) 10 MG tablet; Take 1 tablet (10 mg total) by mouth daily as needed for heartburn or indigestion. After dialysis on dialysis days -     Evolocumab (REPATHA SURECLICK) 140 MG/ML SOAJ; Inject 140 mg into the skin every 14 (fourteen) days.     Medicare Attestation A preventative services visit was completed today.  During the course of the visit the patient was educated and counseled about appropriate screening and preventive services.  A health risk assessment was established with the patient that included a review of current medications, allergies, social history, family history, medical and preventative health history, biometrics, and preventative screenings to identify potential safety concerns or impairments.  A personalized plan was printed today for the patient's records and use.   Personalized health advice and education was given today to reduce health risks and promote self management and wellness.  Information regarding end of life planning was discussed today.  Ludie Gent, PA-C   05/09/2024

## 2024-05-10 ENCOUNTER — Other Ambulatory Visit: Payer: Self-pay | Admitting: Medical

## 2024-05-10 ENCOUNTER — Ambulatory Visit: Payer: Self-pay | Admitting: Medical

## 2024-05-10 LAB — TSH+FREE T4
Free T4: 1.14 ng/dL (ref 0.82–1.77)
TSH: 0.391 u[IU]/mL — AB (ref 0.450–4.500)

## 2024-05-10 LAB — HEMOGLOBIN A1C
Est. average glucose Bld gHb Est-mCnc: 97 mg/dL
Hgb A1c MFr Bld: 5 % (ref 4.8–5.6)

## 2024-05-10 MED ORDER — LOSARTAN POTASSIUM 100 MG PO TABS: ORAL_TABLET | ORAL | 1 refills | Status: AC

## 2024-05-10 MED ORDER — METOPROLOL SUCCINATE ER 25 MG PO TB24: 25.0000 mg | ORAL_TABLET | Freq: Every day | ORAL | 1 refills | Status: AC

## 2024-05-10 NOTE — Progress Notes (Signed)
 Diabetes lab okay, thyroid  level okay.  As we discussed yesterday cut the cholesterol medicine in half for the time being to see if that helps with the leg weakness.  Begin the new Repatha every 2 weeks injection.  Make sure she does this every 2 weeks.  I may have mis spoked and said weekly, but it is every 2 weeks  Begin the famotidine  Pepcid  to help with heartburn  Print and mail her a copy of her visit note

## 2024-05-13 ENCOUNTER — Telehealth: Payer: Self-pay | Admitting: Pharmacy Technician

## 2024-05-13 ENCOUNTER — Other Ambulatory Visit (HOSPITAL_COMMUNITY): Payer: Self-pay

## 2024-05-13 NOTE — Telephone Encounter (Signed)
 Pharmacy Patient Advocate Encounter  Received notification from [employer] that Prior Authorization for REPATHA SURECLICK 140 MG/ML has been APPROVED from 10/03/23 to 05/13/25. Ran test claim, Copay is $4.80. This test claim was processed through [facility]- copay amounts may vary at other pharmacies due to pharmacy/plan contracts, or as the patient moves through the different stages of their insurance plan.   PA #/Case ID/Reference #: [id number]

## 2024-06-27 LAB — HM MAMMOGRAPHY

## 2024-06-28 ENCOUNTER — Ambulatory Visit: Payer: Self-pay | Admitting: Medical

## 2024-07-06 ENCOUNTER — Encounter (HOSPITAL_COMMUNITY): Admission: RE | Payer: Self-pay | Source: Home / Self Care

## 2024-07-06 ENCOUNTER — Ambulatory Visit (HOSPITAL_COMMUNITY): Admission: RE | Admit: 2024-07-06 | Source: Home / Self Care | Admitting: Vascular Surgery

## 2024-07-06 SURGERY — A/V FISTULAGRAM
Anesthesia: LOCAL | Site: Arm Upper | Laterality: Left

## 2024-08-11 ENCOUNTER — Telehealth: Payer: Self-pay

## 2024-08-11 NOTE — Telephone Encounter (Signed)
 Copied from CRM [id number]. Topic: Clinical - Medical Advice >> Aug 11, 2024 10:29 AM Kevelyn M wrote: Reason for CRM: Bernardino calling with [facility] requesting Mammogram images.  Call back # [phone]

## 2024-08-11 NOTE — Telephone Encounter (Signed)
 Left detailed message for Bernardino that he would need to call [facility] in [place] to get the images
